# Patient Record
Sex: Male | Born: 1935 | Race: Black or African American | Hispanic: No | Smoking: Never smoker
Health system: Southern US, Community
[De-identification: ages and names within clinical notes are randomized; demographics above are authoritative.]

## PROBLEM LIST (undated history)

## (undated) DIAGNOSIS — R350 Frequency of micturition: Secondary | ICD-10-CM

## (undated) DIAGNOSIS — I10 Essential (primary) hypertension: Secondary | ICD-10-CM

## (undated) DIAGNOSIS — N529 Male erectile dysfunction, unspecified: Secondary | ICD-10-CM

## (undated) DIAGNOSIS — R55 Syncope and collapse: Secondary | ICD-10-CM

## (undated) DIAGNOSIS — I509 Heart failure, unspecified: Secondary | ICD-10-CM

## (undated) DIAGNOSIS — E785 Hyperlipidemia, unspecified: Secondary | ICD-10-CM

## (undated) DIAGNOSIS — H269 Unspecified cataract: Secondary | ICD-10-CM

## (undated) DIAGNOSIS — G709 Myoneural disorder, unspecified: Secondary | ICD-10-CM

## (undated) DIAGNOSIS — C801 Malignant (primary) neoplasm, unspecified: Secondary | ICD-10-CM

## (undated) DIAGNOSIS — J189 Pneumonia, unspecified organism: Secondary | ICD-10-CM

## (undated) HISTORY — PX: HERNIA REPAIR: SHX51

## (undated) HISTORY — PX: SMALL INTESTINE SURGERY: SHX150

## (undated) HISTORY — PX: PROSTATE SURGERY: SHX751

## (undated) HISTORY — PX: TONSILLECTOMY: SUR1361

## (undated) HISTORY — PX: EYE SURGERY: SHX253

---

## 1997-08-13 ENCOUNTER — Other Ambulatory Visit: Admission: RE | Admit: 1997-08-13 | Discharge: 1997-08-13 | Payer: Self-pay | Admitting: Internal Medicine

## 1998-05-09 ENCOUNTER — Encounter: Payer: Self-pay | Admitting: Emergency Medicine

## 1998-05-09 ENCOUNTER — Emergency Department (HOSPITAL_COMMUNITY): Admission: EM | Admit: 1998-05-09 | Discharge: 1998-05-09 | Payer: Self-pay | Admitting: Emergency Medicine

## 1999-04-01 ENCOUNTER — Emergency Department (HOSPITAL_COMMUNITY): Admission: EM | Admit: 1999-04-01 | Discharge: 1999-04-01 | Payer: Self-pay | Admitting: Emergency Medicine

## 1999-04-01 ENCOUNTER — Encounter: Payer: Self-pay | Admitting: Emergency Medicine

## 2003-08-17 ENCOUNTER — Emergency Department (HOSPITAL_COMMUNITY): Admission: EM | Admit: 2003-08-17 | Discharge: 2003-08-18 | Payer: Self-pay | Admitting: Emergency Medicine

## 2004-07-03 ENCOUNTER — Ambulatory Visit (HOSPITAL_COMMUNITY): Admission: RE | Admit: 2004-07-03 | Discharge: 2004-07-03 | Payer: Self-pay | Admitting: Endocrinology

## 2004-11-04 ENCOUNTER — Ambulatory Visit (HOSPITAL_COMMUNITY): Admission: RE | Admit: 2004-11-04 | Discharge: 2004-11-04 | Payer: Self-pay | Admitting: Endocrinology

## 2005-02-17 ENCOUNTER — Ambulatory Visit: Payer: Self-pay | Admitting: Gastroenterology

## 2005-03-12 ENCOUNTER — Ambulatory Visit: Payer: Self-pay | Admitting: Gastroenterology

## 2005-03-12 ENCOUNTER — Encounter (INDEPENDENT_AMBULATORY_CARE_PROVIDER_SITE_OTHER): Payer: Self-pay | Admitting: Specialist

## 2005-03-19 ENCOUNTER — Ambulatory Visit (HOSPITAL_COMMUNITY): Admission: RE | Admit: 2005-03-19 | Discharge: 2005-03-19 | Payer: Self-pay | Admitting: Urology

## 2005-03-29 ENCOUNTER — Ambulatory Visit (HOSPITAL_COMMUNITY): Admission: RE | Admit: 2005-03-29 | Discharge: 2005-03-29 | Payer: Self-pay | Admitting: Urology

## 2005-04-02 ENCOUNTER — Emergency Department (HOSPITAL_COMMUNITY): Admission: EM | Admit: 2005-04-02 | Discharge: 2005-04-02 | Payer: Self-pay | Admitting: Emergency Medicine

## 2005-04-05 ENCOUNTER — Ambulatory Visit (HOSPITAL_COMMUNITY): Admission: RE | Admit: 2005-04-05 | Discharge: 2005-04-05 | Payer: Self-pay | Admitting: Urology

## 2005-04-14 ENCOUNTER — Ambulatory Visit (HOSPITAL_COMMUNITY): Admission: RE | Admit: 2005-04-14 | Discharge: 2005-04-14 | Payer: Self-pay | Admitting: Urology

## 2005-04-30 ENCOUNTER — Encounter (HOSPITAL_COMMUNITY): Admission: RE | Admit: 2005-04-30 | Discharge: 2005-07-29 | Payer: Self-pay | Admitting: Urology

## 2005-04-30 ENCOUNTER — Emergency Department (HOSPITAL_COMMUNITY): Admission: EM | Admit: 2005-04-30 | Discharge: 2005-04-30 | Payer: Self-pay | Admitting: Emergency Medicine

## 2005-05-05 ENCOUNTER — Emergency Department (HOSPITAL_COMMUNITY): Admission: EM | Admit: 2005-05-05 | Discharge: 2005-05-05 | Payer: Self-pay | Admitting: Emergency Medicine

## 2005-05-25 ENCOUNTER — Ambulatory Visit: Payer: Self-pay | Admitting: Oncology

## 2005-07-28 ENCOUNTER — Ambulatory Visit: Payer: Self-pay | Admitting: Oncology

## 2006-03-31 ENCOUNTER — Emergency Department (HOSPITAL_COMMUNITY): Admission: EM | Admit: 2006-03-31 | Discharge: 2006-03-31 | Payer: Self-pay | Admitting: Emergency Medicine

## 2006-12-23 ENCOUNTER — Observation Stay (HOSPITAL_COMMUNITY): Admission: EM | Admit: 2006-12-23 | Discharge: 2006-12-24 | Payer: Self-pay | Admitting: Emergency Medicine

## 2010-04-20 ENCOUNTER — Encounter
Admission: RE | Admit: 2010-04-20 | Discharge: 2010-04-20 | Payer: Self-pay | Source: Home / Self Care | Attending: Endocrinology | Admitting: Endocrinology

## 2010-05-20 ENCOUNTER — Ambulatory Visit (HOSPITAL_COMMUNITY)
Admission: RE | Admit: 2010-05-20 | Discharge: 2010-05-20 | Payer: Self-pay | Source: Home / Self Care | Attending: Endocrinology | Admitting: Endocrinology

## 2010-09-22 NOTE — H&P (Signed)
NAME:  [name], [name]                 ACCOUNT NO.:  [id number]   MEDICAL RECORD NO.:  [id number]          PATIENT TYPE:  EMS   LOCATION:  ED                           FACILITY:  [facility]   PHYSICIAN:  Alfonse Alpers. Gegick, M.D.DATE OF BIRTH:  09/04/1930   DATE OF ADMISSION:  12/23/2006  DATE OF DISCHARGE:                              HISTORY & PHYSICAL   CHIEF COMPLAINT:  This is a 77 year old man who presents with a history  of syncope.   HISTORY OF PRESENT ILLNESS:  The patient has been in relatively good  health.  He has a history of several medical problems including diabetes  mellitus, hypertension and dyslipidemia.  He had an episode of syncope  approximately 5 years ago.  This was a brief episode of syncope and had  no discernible etiology for this.  He has been doing well except for the  last 3 days he has noticed some slight lightheadedness.  This is  associated with the usual activities.  Possibly it was related to some  vertigo.  However, the symptoms appear to be more of a sensation of  things becoming lightheaded.  Today, after eating breakfast, he went for  his usual walk.  He had no shortness of breath, no chest pain and no  symptoms during the episode except for the slight lightheadedness which  has been present for the last 3 days.  While talking to the mailman, he  had a brief episode of syncope.  Not completely certain whether this was  complete syncope or not.  He did not hurt himself.  There is no history  of head trauma.  He presented to the emergency room, and laboratory data  is negative.  He feels well now.  There are no symptoms now.   PAST MEDICAL HISTORY:  He has a history of cancer of the prostate, and  this has been treated.  He also has a history of diabetes mellitus which  has been controlled with Lantus insulin and Humalog.  In addition, he  has been taking ACTOplus met.  He has a history of dyslipidemia, and he  has been taking Lipitor for this.   MEDICATIONS:  Medications prior to this admission include:  1. Lipitor 40 mg one a day.  2. Avalide 300/25 one daily.  3. Labetalol 200 b.i.d.  4. Lantus 32 units daily.  5. NovoLog 10 units.   PERSONAL HISTORY:  He does not smoke.  He denies any history of  excessive alcohol.   PHYSICAL EXAM:  GENERAL:  This a well-developed man who appears  clinically stable at this time.  He is essentially asymptomatic.  He  moves all 4 extremities.  No muscle strength weakness is present.  HEAD:  Normocephalic without any evidence of trauma.  NECK:  Supple.  Both carotids are palpable.  No bruits are noted.  The  thyroid is not enlarged.  LUNGS:  Clear.  CARDIOVASCULAR:  Rhythm is regular.  Occasional extrasystole is noted.  NEUROMUSCULAR:  Essentially normal.  He moves all 4 extremities.  NEURO:  Mentally he is alert, and no  other focal or neurological  symptoms are present.   IMPRESSION:  1. Syncope.  2. Diabetes mellitus (per history).  3. History of hypertension.  4. History of dyslipidemia.   DISCUSSION:  The patient probably has a benign episode of syncope.  He  will be admitted to the hospital and on telemetry for observation.           ______________________________  Alfonse Alpers Dagoberto Ligas, M.D.     CGG/MEDQ  D:  12/23/2006  T:  12/25/2006  Job:  [id number]

## 2010-09-22 NOTE — Discharge Summary (Signed)
[name], [name]                 ACCOUNT NO.:  [id number]   MEDICAL RECORD NO.:  [id number]          PATIENT TYPE:  INP   LOCATION:  1402                         FACILITY:  [facility]   PHYSICIAN:  Alfonse Alpers. Gegick, M.D.DATE OF BIRTH:  1930/09/20   DATE OF ADMISSION:  12/23/2006  DATE OF DISCHARGE:  12/24/2006                               DISCHARGE SUMMARY   HISTORY:  This is a 77 year old man who previously had an episode of  syncope approximately 5 years ago.  No definable etiology was determined  at that time.  He has been doing well and feeling well except for the  last 3 days prior to this admission he has felt somewhat lightheaded.  This was exacerbated during his exercise.  He went or his usual walk on  the morning of his admission, and then after he returned, he had an  episode of syncope while talking to a mailman.  During his previous walk  at that time, the patient did not have any chest pain or shortness of  breath.  There are no cardiac symptoms.  He also checked his sugar, and  there was no hypoglycemia prior to his walk.  He was brought to the  emergency room, and his symptoms had completely resolved.  He felt  perfectly fine.   MEDICATIONS PRIOR TO ADMISSION:  Included Lipitor, Avalide, labetalol,  Actoplus Met, Lantus, and NovoLog.   PHYSICAL EXAMINATION:  Was essentially negative.  No localizing or  neurological signs were present.  He felt well, and no changes were  present.   IMPRESSION ON ADMISSION:  Syncope, possibly related to increase in  activity with the warm weather associated with some mild dehydration.   HOSPITAL COURSE:  The patient was admitted to the hospital and placed on  telemetry.  He felt well and did well. No a to have a episodes of  ventricular tachycardia or PVCs.  He was asymptomatic, and he was then  discharged to be further evaluated as an outpatient.   IMPRESSION ON DISCHARGE:  1. Syncope, etiology possibly related to mild  dehydration.  2. Diabetes mellitus per history.  3. History of hypertension.   DISCHARGE DIET:  Carbohydrate-restricted diet.   ACTIVITY ON DISCHARGE:  Gradually to be increased.   FOLLOWUP:  He will be seeing Dr. Lucianne Muss on Wednesday.   CONDITION ON DISCHARGE:  Improved.          ______________________________  Alfonse Alpers Dagoberto Ligas, M.D.    CGG/MEDQ  D:  12/24/2006  T:  12/25/2006  Job:  [id number]

## 2010-09-25 NOTE — Op Note (Signed)
NAME:  [name], [name]                 ACCOUNT NO.:  [id number]   MEDICAL RECORD NO.:  [id number]          PATIENT TYPE:  AMB   LOCATION:  DAY                          FACILITY:  [facility]   PHYSICIAN:  Sigmund I. Patsi Sears, M.D.DATE OF BIRTH:  Nov 19, 1930   DATE OF PROCEDURE:  04/14/2005  DATE OF DISCHARGE:                                 OPERATIVE REPORT   PREOPERATIVE DIAGNOSES:  Clinical T1C adenocarcinoma of the prostate.   POSTOPERATIVE DIAGNOSES:  Clinical T1C adenocarcinoma of the prostate.   OPERATION:  Cryoablation of the prostate.   SURGEON:  Sigmund I. Patsi Sears, M.D.   ANESTHESIA:  General LMA.   PREPARATION:  After appropriate preanesthesia, the patient is brought to the  operating room, placed on the operating table in dorsal supine position  where general LMA anesthesia was introduced. He was then replaced in dorsal  lithotomy position where the pubis was prepped with Betadine solution and  draped in the usual fashion.   HISTORY:  [name] is a 77 year old married black male, insulin dependent  diabetic, with a history of BPH treated with Cardura, erectile function in  the past treated with Viagra. PSA jumped from 3.49 in 2005 to 6.,03 in 2006,  with biopsy showing Gleason 6 adenocarcinoma of the prostate, as well as  PIN. He has selected cryosurgery as primary therapy. (T1C). Note, his past  history of peripheral neuropathy, and retinopathy.   DESCRIPTION OF PROCEDURE:  With the patient in dorsal lithotomy position,  the pubis was prepped with Betadine solution and draped in the usual  fashion.   Cystourethroscopy was accomplished, and showed a normal-appearing urethra in  its pendulous portion, and a normal membranous urethra and a  normal  prostatic urethra. The bladder neck was in normal position. The bladder  itself showed no evidence of bladder, stone, tumor or diverticular  formation. There was clear efflux in both orifices. Under direct vision, a  suprapubic tube was placed to straight drainage. A guidewire was then passed  in the bladder, and a urethral warming device was placed across the  guidewire into the bladder without difficulty.   The needles were then placed for cryosurgery, and two thermal sensors were  also placed. Five groups of needles were placed in total. Two free thaw  cycles were then accomplished, with excellent treatment of the entire gland.  The  patient tolerated the procedure quite well. Following the second thaw, the  needles were removed. Pressure was placed in the perineum. The patient was  given IV Toradol, awakened and taken to the recovery room in good condition.      Sigmund I. Patsi Sears, M.D.  Electronically Signed     SIT/MEDQ  D:  04/14/2005  T:  04/14/2005  Job:  [id number]

## 2010-11-13 ENCOUNTER — Emergency Department (HOSPITAL_COMMUNITY)
Admission: EM | Admit: 2010-11-13 | Discharge: 2010-11-13 | Disposition: A | Discharge status: Home / Self Care | Payer: Medicare Other | Attending: Emergency Medicine | Admitting: Emergency Medicine

## 2010-11-13 DIAGNOSIS — R319 Hematuria, unspecified: Secondary | ICD-10-CM | POA: Insufficient documentation

## 2010-11-13 DIAGNOSIS — I10 Essential (primary) hypertension: Secondary | ICD-10-CM | POA: Insufficient documentation

## 2010-11-13 DIAGNOSIS — N390 Urinary tract infection, site not specified: Secondary | ICD-10-CM | POA: Insufficient documentation

## 2010-11-13 DIAGNOSIS — Z794 Long term (current) use of insulin: Secondary | ICD-10-CM | POA: Insufficient documentation

## 2010-11-13 DIAGNOSIS — Z8546 Personal history of malignant neoplasm of prostate: Secondary | ICD-10-CM | POA: Insufficient documentation

## 2010-11-13 DIAGNOSIS — E119 Type 2 diabetes mellitus without complications: Secondary | ICD-10-CM | POA: Insufficient documentation

## 2010-11-13 LAB — URINALYSIS, ROUTINE W REFLEX MICROSCOPIC
Glucose, UA: NEGATIVE mg/dL
Nitrite: NEGATIVE
Protein, ur: >300 mg/dL — AB
Specific Gravity, Urine: 1.024 (ref 1.005–1.030)
Urobilinogen, UA: 1 mg/dL (ref 0.0–1.0)
pH: 6.5 (ref 5.0–8.0)

## 2010-11-13 LAB — URINE MICROSCOPIC-ADD ON

## 2010-11-13 LAB — CBC
HCT: 35.3 % — ABNORMAL LOW (ref 39.0–52.0)
Hemoglobin: 12.2 g/dL — ABNORMAL LOW (ref 13.0–17.0)
MCHC: 34.6 g/dL (ref 30.0–36.0)
RBC: 3.81 MIL/uL — ABNORMAL LOW (ref 4.22–5.81)
RDW: 14.5 % (ref 11.5–15.5)
WBC: 8.7 10*3/uL (ref 4.0–10.5)

## 2010-11-13 LAB — PROTIME-INR
INR: 1.04 (ref 0.00–1.49)
Prothrombin Time: 13.8 seconds (ref 11.6–15.2)

## 2010-11-13 LAB — APTT: aPTT: 37 seconds (ref 24–37)

## 2010-11-15 LAB — URINE CULTURE
Colony Count: 9000
Culture  Setup Time: 201207070125

## 2011-02-19 LAB — DIFFERENTIAL
Basophils Absolute: 0
Basophils Relative: 1
Eosinophils Absolute: 0.1
Eosinophils Relative: 1
Lymphocytes Relative: 23
Lymphs Abs: 1.5
Monocytes Absolute: 0.4
Monocytes Relative: 6
Neutro Abs: 4.6
Neutrophils Relative %: 70

## 2011-02-19 LAB — URINALYSIS, ROUTINE W REFLEX MICROSCOPIC
Bilirubin Urine: NEGATIVE
Glucose, UA: NEGATIVE
Hgb urine dipstick: NEGATIVE
Ketones, ur: NEGATIVE
Nitrite: NEGATIVE
Protein, ur: NEGATIVE
Specific Gravity, Urine: 1.01
Urobilinogen, UA: 0.2
pH: 7

## 2011-02-19 LAB — CBC
HCT: 34.8 — ABNORMAL LOW
HCT: 38.6 — ABNORMAL LOW
Hemoglobin: 11.8 — ABNORMAL LOW
Hemoglobin: 13.3
MCHC: 33.8
MCHC: 34.4
MCV: 93.9
MCV: 94.6
Platelets: 196
Platelets: 203
RBC: 3.68 — ABNORMAL LOW
RBC: 4.11 — ABNORMAL LOW
RDW: 14.4 — ABNORMAL HIGH
RDW: 14.4 — ABNORMAL HIGH
WBC: 6.7
WBC: 6.9

## 2011-02-19 LAB — BASIC METABOLIC PANEL
BUN: 10
CO2: 26
Calcium: 9.6
Chloride: 105
Creatinine, Ser: 1.23
GFR calc Af Amer: >60
GFR calc non Af Amer: 58 — ABNORMAL LOW
Glucose, Bld: 81
Potassium: 3.8
Sodium: 139

## 2011-02-19 LAB — POCT CARDIAC MARKERS
CKMB, poc: 2.9
Myoglobin, poc: 286
Operator id: 4661
Troponin i, poc: <0.05

## 2011-03-09 ENCOUNTER — Other Ambulatory Visit: Payer: Self-pay | Admitting: Endocrinology

## 2011-03-09 DIAGNOSIS — R221 Localized swelling, mass and lump, neck: Secondary | ICD-10-CM

## 2011-03-17 ENCOUNTER — Ambulatory Visit
Admission: RE | Admit: 2011-03-17 | Discharge: 2011-03-17 | Disposition: A | Discharge status: Home / Self Care | Payer: Medicare Other | Source: Ambulatory Visit | Attending: Endocrinology | Admitting: Endocrinology

## 2011-03-17 DIAGNOSIS — R221 Localized swelling, mass and lump, neck: Secondary | ICD-10-CM

## 2011-03-19 ENCOUNTER — Other Ambulatory Visit: Payer: Self-pay | Admitting: Endocrinology

## 2011-03-19 DIAGNOSIS — E041 Nontoxic single thyroid nodule: Secondary | ICD-10-CM

## 2011-03-26 ENCOUNTER — Ambulatory Visit
Admission: RE | Admit: 2011-03-26 | Discharge: 2011-03-26 | Disposition: A | Discharge status: Home / Self Care | Payer: Medicare Other | Source: Ambulatory Visit | Attending: Endocrinology | Admitting: Endocrinology

## 2011-03-26 DIAGNOSIS — E041 Nontoxic single thyroid nodule: Secondary | ICD-10-CM

## 2011-05-12 DIAGNOSIS — D649 Anemia, unspecified: Secondary | ICD-10-CM | POA: Diagnosis not present

## 2011-05-14 DIAGNOSIS — E785 Hyperlipidemia, unspecified: Secondary | ICD-10-CM | POA: Diagnosis not present

## 2011-05-14 DIAGNOSIS — I10 Essential (primary) hypertension: Secondary | ICD-10-CM | POA: Diagnosis not present

## 2011-05-14 DIAGNOSIS — D649 Anemia, unspecified: Secondary | ICD-10-CM | POA: Diagnosis not present

## 2011-05-18 DIAGNOSIS — H2510 Age-related nuclear cataract, unspecified eye: Secondary | ICD-10-CM | POA: Diagnosis not present

## 2011-05-19 DIAGNOSIS — L608 Other nail disorders: Secondary | ICD-10-CM | POA: Diagnosis not present

## 2011-05-19 DIAGNOSIS — E1149 Type 2 diabetes mellitus with other diabetic neurological complication: Secondary | ICD-10-CM | POA: Diagnosis not present

## 2011-05-26 DIAGNOSIS — H2589 Other age-related cataract: Secondary | ICD-10-CM | POA: Diagnosis not present

## 2011-05-26 DIAGNOSIS — H2510 Age-related nuclear cataract, unspecified eye: Secondary | ICD-10-CM | POA: Diagnosis not present

## 2011-05-26 DIAGNOSIS — E11319 Type 2 diabetes mellitus with unspecified diabetic retinopathy without macular edema: Secondary | ICD-10-CM | POA: Diagnosis not present

## 2011-05-26 DIAGNOSIS — E1139 Type 2 diabetes mellitus with other diabetic ophthalmic complication: Secondary | ICD-10-CM | POA: Diagnosis not present

## 2011-05-26 DIAGNOSIS — IMO0002 Reserved for concepts with insufficient information to code with codable children: Secondary | ICD-10-CM | POA: Diagnosis not present

## 2011-06-09 DIAGNOSIS — E1139 Type 2 diabetes mellitus with other diabetic ophthalmic complication: Secondary | ICD-10-CM | POA: Diagnosis not present

## 2011-06-09 DIAGNOSIS — E11319 Type 2 diabetes mellitus with unspecified diabetic retinopathy without macular edema: Secondary | ICD-10-CM | POA: Diagnosis not present

## 2011-06-09 DIAGNOSIS — H2589 Other age-related cataract: Secondary | ICD-10-CM | POA: Diagnosis not present

## 2011-06-09 DIAGNOSIS — H2510 Age-related nuclear cataract, unspecified eye: Secondary | ICD-10-CM | POA: Diagnosis not present

## 2011-06-09 DIAGNOSIS — IMO0002 Reserved for concepts with insufficient information to code with codable children: Secondary | ICD-10-CM | POA: Diagnosis not present

## 2011-07-09 ENCOUNTER — Other Ambulatory Visit: Payer: Medicare Other

## 2011-07-29 DIAGNOSIS — D649 Anemia, unspecified: Secondary | ICD-10-CM | POA: Diagnosis not present

## 2011-07-29 DIAGNOSIS — E785 Hyperlipidemia, unspecified: Secondary | ICD-10-CM | POA: Diagnosis not present

## 2011-07-29 DIAGNOSIS — R609 Edema, unspecified: Secondary | ICD-10-CM | POA: Diagnosis not present

## 2011-07-29 DIAGNOSIS — IMO0001 Reserved for inherently not codable concepts without codable children: Secondary | ICD-10-CM | POA: Diagnosis not present

## 2011-08-13 DIAGNOSIS — R609 Edema, unspecified: Secondary | ICD-10-CM | POA: Diagnosis not present

## 2011-08-13 DIAGNOSIS — D649 Anemia, unspecified: Secondary | ICD-10-CM | POA: Diagnosis not present

## 2011-08-13 DIAGNOSIS — E785 Hyperlipidemia, unspecified: Secondary | ICD-10-CM | POA: Diagnosis not present

## 2011-08-13 DIAGNOSIS — I10 Essential (primary) hypertension: Secondary | ICD-10-CM | POA: Diagnosis not present

## 2011-08-13 DIAGNOSIS — R5383 Other fatigue: Secondary | ICD-10-CM | POA: Diagnosis not present

## 2011-08-13 DIAGNOSIS — R5381 Other malaise: Secondary | ICD-10-CM | POA: Diagnosis not present

## 2011-08-24 DIAGNOSIS — L608 Other nail disorders: Secondary | ICD-10-CM | POA: Diagnosis not present

## 2011-08-24 DIAGNOSIS — E1149 Type 2 diabetes mellitus with other diabetic neurological complication: Secondary | ICD-10-CM | POA: Diagnosis not present

## 2011-08-27 DIAGNOSIS — I70219 Atherosclerosis of native arteries of extremities with intermittent claudication, unspecified extremity: Secondary | ICD-10-CM | POA: Diagnosis not present

## 2011-09-01 ENCOUNTER — Other Ambulatory Visit: Payer: Self-pay | Admitting: Endocrinology

## 2011-09-01 DIAGNOSIS — E042 Nontoxic multinodular goiter: Secondary | ICD-10-CM

## 2011-09-13 ENCOUNTER — Ambulatory Visit
Admission: RE | Admit: 2011-09-13 | Discharge: 2011-09-13 | Disposition: A | Discharge status: Home / Self Care | Payer: Medicare Other | Source: Ambulatory Visit | Attending: Endocrinology | Admitting: Endocrinology

## 2011-09-13 DIAGNOSIS — E042 Nontoxic multinodular goiter: Secondary | ICD-10-CM | POA: Diagnosis not present

## 2011-10-13 DIAGNOSIS — IMO0001 Reserved for inherently not codable concepts without codable children: Secondary | ICD-10-CM | POA: Diagnosis not present

## 2011-10-13 DIAGNOSIS — D649 Anemia, unspecified: Secondary | ICD-10-CM | POA: Diagnosis not present

## 2011-10-15 ENCOUNTER — Ambulatory Visit
Admission: RE | Admit: 2011-10-15 | Discharge: 2011-10-15 | Disposition: A | Discharge status: Home / Self Care | Payer: Medicare Other | Source: Ambulatory Visit | Attending: Endocrinology | Admitting: Endocrinology

## 2011-10-15 ENCOUNTER — Other Ambulatory Visit: Payer: Self-pay | Admitting: Endocrinology

## 2011-10-15 DIAGNOSIS — M6281 Muscle weakness (generalized): Secondary | ICD-10-CM | POA: Diagnosis not present

## 2011-10-15 DIAGNOSIS — E1149 Type 2 diabetes mellitus with other diabetic neurological complication: Secondary | ICD-10-CM | POA: Diagnosis not present

## 2011-10-15 DIAGNOSIS — M47817 Spondylosis without myelopathy or radiculopathy, lumbosacral region: Secondary | ICD-10-CM | POA: Diagnosis not present

## 2011-10-15 DIAGNOSIS — M545 Low back pain, unspecified: Secondary | ICD-10-CM

## 2011-10-15 DIAGNOSIS — R059 Cough, unspecified: Secondary | ICD-10-CM

## 2011-10-15 DIAGNOSIS — R05 Cough: Secondary | ICD-10-CM

## 2011-10-15 DIAGNOSIS — R5383 Other fatigue: Secondary | ICD-10-CM | POA: Diagnosis not present

## 2011-10-15 DIAGNOSIS — M5137 Other intervertebral disc degeneration, lumbosacral region: Secondary | ICD-10-CM | POA: Diagnosis not present

## 2011-10-15 DIAGNOSIS — R0602 Shortness of breath: Secondary | ICD-10-CM | POA: Diagnosis not present

## 2011-10-15 DIAGNOSIS — R634 Abnormal weight loss: Secondary | ICD-10-CM | POA: Diagnosis not present

## 2011-10-15 DIAGNOSIS — D649 Anemia, unspecified: Secondary | ICD-10-CM | POA: Diagnosis not present

## 2011-10-15 DIAGNOSIS — E1142 Type 2 diabetes mellitus with diabetic polyneuropathy: Secondary | ICD-10-CM | POA: Diagnosis not present

## 2011-10-18 ENCOUNTER — Emergency Department (HOSPITAL_COMMUNITY): Payer: Medicare Other

## 2011-10-18 ENCOUNTER — Emergency Department (HOSPITAL_COMMUNITY)
Admission: EM | Admit: 2011-10-18 | Discharge: 2011-10-19 | Disposition: A | Discharge status: Home / Self Care | Payer: Medicare Other | Attending: Emergency Medicine | Admitting: Emergency Medicine

## 2011-10-18 ENCOUNTER — Encounter (HOSPITAL_COMMUNITY): Payer: Self-pay

## 2011-10-18 DIAGNOSIS — M4810 Ankylosing hyperostosis [Forestier], site unspecified: Secondary | ICD-10-CM | POA: Insufficient documentation

## 2011-10-18 DIAGNOSIS — R531 Weakness: Secondary | ICD-10-CM

## 2011-10-18 DIAGNOSIS — J3489 Other specified disorders of nose and nasal sinuses: Secondary | ICD-10-CM | POA: Diagnosis not present

## 2011-10-18 DIAGNOSIS — R05 Cough: Secondary | ICD-10-CM | POA: Insufficient documentation

## 2011-10-18 DIAGNOSIS — M948X9 Other specified disorders of cartilage, unspecified sites: Secondary | ICD-10-CM | POA: Diagnosis not present

## 2011-10-18 DIAGNOSIS — R059 Cough, unspecified: Secondary | ICD-10-CM | POA: Diagnosis not present

## 2011-10-18 DIAGNOSIS — M549 Dorsalgia, unspecified: Secondary | ICD-10-CM | POA: Diagnosis not present

## 2011-10-18 DIAGNOSIS — M79609 Pain in unspecified limb: Secondary | ICD-10-CM | POA: Diagnosis not present

## 2011-10-18 DIAGNOSIS — M5380 Other specified dorsopathies, site unspecified: Secondary | ICD-10-CM | POA: Diagnosis not present

## 2011-10-18 DIAGNOSIS — I2510 Atherosclerotic heart disease of native coronary artery without angina pectoris: Secondary | ICD-10-CM | POA: Diagnosis not present

## 2011-10-18 DIAGNOSIS — I10 Essential (primary) hypertension: Secondary | ICD-10-CM | POA: Insufficient documentation

## 2011-10-18 DIAGNOSIS — R262 Difficulty in walking, not elsewhere classified: Secondary | ICD-10-CM | POA: Insufficient documentation

## 2011-10-18 DIAGNOSIS — R29898 Other symptoms and signs involving the musculoskeletal system: Secondary | ICD-10-CM | POA: Diagnosis not present

## 2011-10-18 DIAGNOSIS — I6789 Other cerebrovascular disease: Secondary | ICD-10-CM | POA: Diagnosis not present

## 2011-10-18 DIAGNOSIS — M545 Low back pain, unspecified: Secondary | ICD-10-CM | POA: Insufficient documentation

## 2011-10-18 DIAGNOSIS — R5383 Other fatigue: Secondary | ICD-10-CM | POA: Diagnosis not present

## 2011-10-18 DIAGNOSIS — R5381 Other malaise: Secondary | ICD-10-CM | POA: Diagnosis not present

## 2011-10-18 DIAGNOSIS — R0602 Shortness of breath: Secondary | ICD-10-CM | POA: Diagnosis not present

## 2011-10-18 DIAGNOSIS — R269 Unspecified abnormalities of gait and mobility: Secondary | ICD-10-CM | POA: Diagnosis not present

## 2011-10-18 DIAGNOSIS — E119 Type 2 diabetes mellitus without complications: Secondary | ICD-10-CM | POA: Insufficient documentation

## 2011-10-18 DIAGNOSIS — R404 Transient alteration of awareness: Secondary | ICD-10-CM | POA: Diagnosis not present

## 2011-10-18 HISTORY — DX: Heart failure, unspecified: I50.9

## 2011-10-18 HISTORY — DX: Essential (primary) hypertension: I10

## 2011-10-18 HISTORY — DX: Male erectile dysfunction, unspecified: N52.9

## 2011-10-18 LAB — URINE MICROSCOPIC-ADD ON

## 2011-10-18 LAB — COMPREHENSIVE METABOLIC PANEL
ALT: 11 U/L (ref 0–53)
AST: 22 U/L (ref 0–37)
Albumin: 3.7 g/dL (ref 3.5–5.2)
Alkaline Phosphatase: 98 U/L (ref 39–117)
BUN: 25 mg/dL — ABNORMAL HIGH (ref 6–23)
Potassium: 3.6 mEq/L (ref 3.5–5.1)
Sodium: 136 mEq/L (ref 135–145)
Total Protein: 7 g/dL (ref 6.0–8.3)

## 2011-10-18 LAB — URINALYSIS, ROUTINE W REFLEX MICROSCOPIC
Hgb urine dipstick: NEGATIVE
Specific Gravity, Urine: 1.021 (ref 1.005–1.030)
Urobilinogen, UA: 1 mg/dL (ref 0.0–1.0)
pH: 5 (ref 5.0–8.0)

## 2011-10-18 LAB — CBC
MCH: 31.6 pg (ref 26.0–34.0)
MCHC: 34.5 g/dL (ref 30.0–36.0)
Platelets: 166 10*3/uL (ref 150–400)
RDW: 14.9 % (ref 11.5–15.5)

## 2011-10-18 LAB — DIFFERENTIAL
Basophils Absolute: 0 10*3/uL (ref 0.0–0.1)
Basophils Relative: 1 % (ref 0–1)
Eosinophils Absolute: 0.2 10*3/uL (ref 0.0–0.7)
Neutro Abs: 3.6 10*3/uL (ref 1.7–7.7)
Neutrophils Relative %: 55 % (ref 43–77)

## 2011-10-18 LAB — CK: Total CK: 421 U/L — ABNORMAL HIGH (ref 7–232)

## 2011-10-18 NOTE — ED Notes (Signed)
Patient transported to CT 

## 2011-10-18 NOTE — Discharge Instructions (Signed)
I have recommended that she be admitted to the hospital for further evaluation, but you would not allow me to admit to. I am concerned that there's pressure on your spinal cord from the bones in your spine. Please make an appointment with the neurosurgeon for further evaluation. You will need to have an MRI scan to further evaluate the spinal cord. In the meantime, do not try walking him as you are using your walker. If you change your mind at any time, come right back to the emergency department and we will make arrangements to admit you.

## 2011-10-18 NOTE — ED Provider Notes (Addendum)
History     CSN: [id number]  Arrival date & time 10/18/11  1844   First MD Initiated Contact with Patient 10/18/11 1855      Chief Complaint  Patient presents with  . Weakness    (Consider location/radiation/quality/duration/timing/severity/associated sxs/prior treatment) Patient is a 78 y.o. male presenting with weakness. The history is provided by the patient.  Weakness  Additional symptoms include weakness.  He came to the emergency department today because his legs gave out on him. He has been having weakness in his legs which has been progressively getting worse over the last several months. He denies any weakness in his arms. He denies any bowel or bladder dysfunction. Denies any numbness or tingling. Symptoms are severe. Nothing makes it better nothing makes it worse.  Past Medical History  Diagnosis Date  . Diabetes mellitus   . ED (erectile dysfunction)   . Hypertension   . CHF (congestive heart failure)     No past surgical history on file.  No family history on file.  History  Substance Use Topics  . Smoking status: Never Smoker   . Smokeless tobacco: Not on file  . Alcohol Use: 1.2 oz/week    2 Shots of liquor per week      Review of Systems  Neurological: Positive for weakness.  All other systems reviewed and are negative.    Allergies  Review of patient's allergies indicates no known allergies.  Home Medications  No current outpatient prescriptions on file.  BP 95/54  Pulse 60  Temp(Src) 97.1 F (36.2 C) (Oral)  Resp 20  SpO2 92%  Physical Exam  Nursing note and vitals reviewed.  78 year old male who is resting comfortably and in no acute distress. Vital signs are normal. Oxygen saturation is 99% which is normal. Head is normocephalic and atraumatic. PERRLA, EOMI. Neck is nontender and supple. Back is nontender. Lungs are clear without rales, wheezes, rhonchi. Heart has regular rate rhythm without murmur. Abdomen is soft, flat, nontender  without masses or hepatosplenomegaly. Extremities have no cyanosis, full range of motion is present. Skin is warm and dry without rash. Neurologic: Mental status is normal. Cranial nerves are intact. Strength in his arms is 5 over 5. Leg strength is as follows: Hip flexors are 4/5, knee extensors are 4.5 over 5, knee flexors are 3/5. There no sensory deficits identified.  ED Course  Procedures (including critical care time)  Results for orders placed during the hospital encounter of 10/18/11  CBC      Component Value Range   WBC 6.6  4.0 - 10.5 (K/uL)   RBC 3.83 (*) 4.22 - 5.81 (MIL/uL)   Hemoglobin 12.1 (*) 13.0 - 17.0 (g/dL)   HCT 40.9 (*) 81.1 - 52.0 (%)   MCV 91.6  78.0 - 100.0 (fL)   MCH 31.6  26.0 - 34.0 (pg)   MCHC 34.5  30.0 - 36.0 (g/dL)   RDW 91.4  78.2 - 95.6 (%)   Platelets 166  150 - 400 (K/uL)  DIFFERENTIAL      Component Value Range   Neutrophils Relative 55  43 - 77 (%)   Neutro Abs 3.6  1.7 - 7.7 (K/uL)   Lymphocytes Relative 35  12 - 46 (%)   Lymphs Abs 2.3  0.7 - 4.0 (K/uL)   Monocytes Relative 8  3 - 12 (%)   Monocytes Absolute 0.5  0.1 - 1.0 (K/uL)   Eosinophils Relative 3  0 - 5 (%)   Eosinophils Absolute 0.2  0.0 - 0.7 (K/uL)   Basophils Relative 1  0 - 1 (%)   Basophils Absolute 0.0  0.0 - 0.1 (K/uL)  COMPREHENSIVE METABOLIC PANEL      Component Value Range   Sodium 136  135 - 145 (mEq/L)   Potassium 3.6  3.5 - 5.1 (mEq/L)   Chloride 99  96 - 112 (mEq/L)   CO2 20  19 - 32 (mEq/L)   Glucose, Bld 138 (*) 70 - 99 (mg/dL)   BUN 25 (*) 6 - 23 (mg/dL)   Creatinine, Ser 4.09 (*) 0.50 - 1.35 (mg/dL)   Calcium 9.5  8.4 - 81.1 (mg/dL)   Total Protein 7.0  6.0 - 8.3 (g/dL)   Albumin 3.7  3.5 - 5.2 (g/dL)   AST 22  0 - 37 (U/L)   ALT 11  0 - 53 (U/L)   Alkaline Phosphatase 98  39 - 117 (U/L)   Total Bilirubin 0.3  0.3 - 1.2 (mg/dL)   GFR calc non Af Amer 47 (*) >90 (mL/min)   GFR calc Af Amer 54 (*) >90 (mL/min)  CK      Component Value Range   Total CK  421 (*) 7 - 232 (U/L)  SEDIMENTATION RATE      Component Value Range   Sed Rate 39 (*) 0 - 16 (mm/hr)  URINALYSIS, ROUTINE W REFLEX MICROSCOPIC      Component Value Range   Color, Urine YELLOW  YELLOW    APPearance CLOUDY (*) CLEAR    Specific Gravity, Urine 1.021  1.005 - 1.030    pH 5.0  5.0 - 8.0    Glucose, UA NEGATIVE  NEGATIVE (mg/dL)   Hgb urine dipstick NEGATIVE  NEGATIVE    Bilirubin Urine SMALL (*) NEGATIVE    Ketones, ur 15 (*) NEGATIVE (mg/dL)   Protein, ur 30 (*) NEGATIVE (mg/dL)   Urobilinogen, UA 1.0  0.0 - 1.0 (mg/dL)   Nitrite NEGATIVE  NEGATIVE    Leukocytes, UA NEGATIVE  NEGATIVE   URINE MICROSCOPIC-ADD ON      Component Value Range   Squamous Epithelial / LPF RARE  RARE    WBC, UA 0-2  <3 (WBC/hpf)   RBC / HPF 0-2  <3 (RBC/hpf)   Bacteria, UA FEW (*) RARE    Casts HYALINE CASTS (*) NEGATIVE    Dg Chest 2 View  10/15/2011  *RADIOLOGY REPORT*  Clinical Data: Cough.  Shortness of breath.  Nonsmoker  CHEST - 2 VIEW  Comparison: 04/20/2010  Findings: Heart and mediastinal contours are within normal limits. The lung fields are clear with no signs of focal infiltrate or congestive failure.  No pleural fluid or significant peribronchial cuffing is seen.  Bony structures demonstrate degenerative osteophytosis of the lower thoracic spine and degenerative change of the right shoulder joint.  IMPRESSION: Stable cardiopulmonary appearance with no new focal or acute abnormality noted.  Original Report Authenticated By: Bertha Stakes, M.D.   Dg Lumbar Spine 2-3 Views  10/15/2011  *RADIOLOGY REPORT*  Clinical Data: Low back pain.  LUMBAR SPINE - 2-3 VIEW  Comparison: CT 11/18/2010  Findings: Sclerotic focus in the left iliac crest, stable since prior study felt to represent a bone island.  Degenerative disc disease and facet disease throughout the lumbar spine. Mild disc space narrowing and spurring.  Normal alignment.  No fracture.  SI joints are symmetric and unremarkable.   IMPRESSION: Degenerative changes.  No acute findings.  Original Report Authenticated By: Cyndie Chime, M.D.  Ct Head Wo Contrast  10/18/2011  *RADIOLOGY REPORT*  Clinical Data: Weakness.  CT HEAD WITHOUT CONTRAST  Technique:  Contiguous axial images were obtained from the base of the skull through the vertex without contrast.  Comparison: 12/22/2005  Findings: Bone windows demonstrate ethmoid air cell mucosal thickening.  Sclerotic lesion in the right vertex is stable since 2008, most consistent with a benign etiology. Clear mastoid air cells.  Soft tissue windows demonstrate subtle hypoattenuation suspected in the region of the right basil ganglia/anterior limb internal capsule.  Image 13.  Patchy periventricular white matter hypoattenuation is slightly greater on the left than right and progressive since 2008.  No hemorrhage, mass lesion, hydrocephalus, intra-axial, or extra- axial fluid collection.  IMPRESSION:  1.  Subtle hypoattenuation in the right basil ganglia.  If the patient has localizing symptoms to suggest acute or subacute ischemia in this area, consider MRI. 2.  Otherwise, no acute intracranial abnormality identified. 3.  Small vessel ischemic change in the periventricular white matter.  This is felt to be chronic but progressive since 12/23/2006. 4.  Sinus disease.  Original Report Authenticated By: Consuello Bossier, M.D.   Ct Thoracic Spine Wo Contrast  10/18/2011  *RADIOLOGY REPORT*  Clinical Data: Weakness.  Chronic back pain.  Difficulty ambulating.  Left leg pain. No trauma history submitted.  CT LUMBAR SPINE WITHOUT CONTRAST,CT THORACIC SPINE WITHOUT CONTRAST  Technique:  Multidetector CT imaging of the lumbar spine was performed without intravenous contrast administration. Multiplanar CT image reconstructions were also generated.,Technique: Multidetector CT imaging of the thoracic spine was performed without  Comparison: None.  Findings: Soft tissue windows demonstrate dilated  fluid-filled thoracic esophagus.  A small hiatal hernia.  Cardiomegaly and coronary artery atherosclerosis.  No paravertebral hematoma.  No soft tissue swelling.  Bone windows demonstrate no acute fracture or subluxation.  Maintenance of vertebral body height.  Diffuse idiopathic skeletal hyperostosis involves the thoracic spine.  Relatively maintained intervertebral discs. Facets are well-aligned.  No gross disc abnormality within the thoracic spine.  Mild extension of diffuse idiopathic skeletal hyperostosis into the upper lumbar spine.  Ordinary for age lumbar spondylosis.  Example mild ligamentum flavum thickening at L2-L3 and L4-L5.  L4-L5 right- sided neural foraminal narrowing.  Left-sided L2-L3 and L3-L4 neural foraminal narrowing secondary mild disc bulges.  Motion degradation inferiorly.  Partial fusion of the bilateral sacroiliac joints, likely degenerative.  Sclerosis of the left iliac wing.  IMPRESSION: Diffuse idiopathic skeletal hyperostosis. Expected for age spondylosis with areas of scattered central canal and neural foraminal narrowing.  These are all suboptimally evaluated on CT. The test of choice for evaluation of these areas is unenhanced non emergent spine MR.  No vertebral body height loss or acute finding.  Dilated esophagus suggesting dysmotility or gastroesophageal reflux.  Suboptimally evaluated.  Original Report Authenticated By: Consuello Bossier, M.D.   Ct Lumbar Spine Wo Contrast  10/18/2011  *RADIOLOGY REPORT*  Clinical Data: Weakness.  Chronic back pain.  Difficulty ambulating.  Left leg pain. No trauma history submitted.  CT LUMBAR SPINE WITHOUT CONTRAST,CT THORACIC SPINE WITHOUT CONTRAST  Technique:  Multidetector CT imaging of the lumbar spine was performed without intravenous contrast administration. Multiplanar CT image reconstructions were also generated.,Technique: Multidetector CT imaging of the thoracic spine was performed without  Comparison: None.  Findings: Soft tissue  windows demonstrate dilated fluid-filled thoracic esophagus.  A small hiatal hernia.  Cardiomegaly and coronary artery atherosclerosis.  No paravertebral hematoma.  No soft tissue swelling.  Bone windows demonstrate no acute  fracture or subluxation.  Maintenance of vertebral body height.  Diffuse idiopathic skeletal hyperostosis involves the thoracic spine.  Relatively maintained intervertebral discs. Facets are well-aligned.  No gross disc abnormality within the thoracic spine.  Mild extension of diffuse idiopathic skeletal hyperostosis into the upper lumbar spine.  Ordinary for age lumbar spondylosis.  Example mild ligamentum flavum thickening at L2-L3 and L4-L5.  L4-L5 right- sided neural foraminal narrowing.  Left-sided L2-L3 and L3-L4 neural foraminal narrowing secondary mild disc bulges.  Motion degradation inferiorly.  Partial fusion of the bilateral sacroiliac joints, likely degenerative.  Sclerosis of the left iliac wing.  IMPRESSION: Diffuse idiopathic skeletal hyperostosis. Expected for age spondylosis with areas of scattered central canal and neural foraminal narrowing.  These are all suboptimally evaluated on CT. The test of choice for evaluation of these areas is unenhanced non emergent spine MR.  No vertebral body height loss or acute finding.  Dilated esophagus suggesting dysmotility or gastroesophageal reflux.  Suboptimally evaluated.  Original Report Authenticated By: Consuello Bossier, M.D.   Dg Chest Portable 1 View  10/18/2011  *RADIOLOGY REPORT*  Clinical Data: Cough and shortness of breath.  PORTABLE CHEST - 1 VIEW  Comparison: 10/15/2011  Findings: Heart size and vascularity are normal and the lungs are clear.  No acute osseous abnormality.  Fairly severe degenerative changes of both shoulders.  IMPRESSION: No acute disease in the chest.  Original Report Authenticated By: Gwynn Burly, M.D.      1. Weakness   2. DISH (diffuse idiopathic skeletal hyperostosis)       MDM    Progressive leg weakness which is concerning for spinal cord lesion. Workup has been initiated including MRI of thoracic and lumbar spine.  Patient was unable to tolerate MR scan and was moving during the scan. Therefore, CT scan was ordered to try and be sure there was not any obvious spinal cord compression. 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 I recommended hospitalization for further evaluation but patient is refusing. He has a walker at home. He is referred to Dr. Yetta Barre who is on call for neurosurgery for outpatient evaluation but he is advised to return to the emergency department if he changes his mind and decides that he would be willing to be admitted.     Dione Booze, MD 10/18/11 2358    Date: 10/19/2011  Rate: 60  Rhythm: normal sinus rhythm  QRS Axis: normal  Intervals: PR prolonged  ST/T Wave abnormalities: normal  Conduction Disutrbances:first-degree A-V block  and left bundle branch block  Narrative Interpretation: First degree AV block, left bundle-branch block. When compared with ECG of 12/23/2006, no significant changes are seen.  Old EKG Reviewed: unchanged    Dione Booze, MD 10/19/11 [phone]

## 2011-10-18 NOTE — ED Notes (Addendum)
Per ems- Pt had sudden onset of weakness and pt daughter could not get him up. Neg stroke scale. Pt could stand but was "swaying from side to side." Pt pos for ETOH (pt states he had 2 glasses of gin). Pt took 2 viagra today. Pt and pt daughter stated that this has happened multiple times. Pt went to MD about this in past and was dx with diabetes.

## 2011-11-01 DIAGNOSIS — M48061 Spinal stenosis, lumbar region without neurogenic claudication: Secondary | ICD-10-CM | POA: Diagnosis not present

## 2011-11-03 ENCOUNTER — Other Ambulatory Visit: Payer: Self-pay | Admitting: Neurological Surgery

## 2011-11-03 DIAGNOSIS — M542 Cervicalgia: Secondary | ICD-10-CM

## 2011-11-03 DIAGNOSIS — M48061 Spinal stenosis, lumbar region without neurogenic claudication: Secondary | ICD-10-CM

## 2011-11-06 ENCOUNTER — Ambulatory Visit
Admission: RE | Admit: 2011-11-06 | Discharge: 2011-11-06 | Disposition: A | Discharge status: Home / Self Care | Payer: Medicare Other | Source: Ambulatory Visit | Attending: Neurological Surgery | Admitting: Neurological Surgery

## 2011-11-06 DIAGNOSIS — M48061 Spinal stenosis, lumbar region without neurogenic claudication: Secondary | ICD-10-CM

## 2011-11-06 DIAGNOSIS — M47817 Spondylosis without myelopathy or radiculopathy, lumbosacral region: Secondary | ICD-10-CM | POA: Diagnosis not present

## 2011-11-06 DIAGNOSIS — M542 Cervicalgia: Secondary | ICD-10-CM

## 2011-11-06 DIAGNOSIS — M5020 Other cervical disc displacement, unspecified cervical region: Secondary | ICD-10-CM | POA: Diagnosis not present

## 2011-11-06 DIAGNOSIS — M5030 Other cervical disc degeneration, unspecified cervical region: Secondary | ICD-10-CM | POA: Diagnosis not present

## 2011-11-06 DIAGNOSIS — R29898 Other symptoms and signs involving the musculoskeletal system: Secondary | ICD-10-CM | POA: Diagnosis not present

## 2011-11-06 DIAGNOSIS — M5126 Other intervertebral disc displacement, lumbar region: Secondary | ICD-10-CM | POA: Diagnosis not present

## 2011-11-06 DIAGNOSIS — M47812 Spondylosis without myelopathy or radiculopathy, cervical region: Secondary | ICD-10-CM | POA: Diagnosis not present

## 2011-11-09 DIAGNOSIS — M4712 Other spondylosis with myelopathy, cervical region: Secondary | ICD-10-CM | POA: Diagnosis not present

## 2011-11-09 DIAGNOSIS — M4802 Spinal stenosis, cervical region: Secondary | ICD-10-CM | POA: Diagnosis not present

## 2011-11-10 ENCOUNTER — Encounter (HOSPITAL_COMMUNITY): Payer: Self-pay | Admitting: Pharmacist

## 2011-11-12 DIAGNOSIS — E1142 Type 2 diabetes mellitus with diabetic polyneuropathy: Secondary | ICD-10-CM | POA: Diagnosis not present

## 2011-11-12 DIAGNOSIS — D649 Anemia, unspecified: Secondary | ICD-10-CM | POA: Diagnosis not present

## 2011-11-12 DIAGNOSIS — R5381 Other malaise: Secondary | ICD-10-CM | POA: Diagnosis not present

## 2011-11-12 DIAGNOSIS — E1149 Type 2 diabetes mellitus with other diabetic neurological complication: Secondary | ICD-10-CM | POA: Diagnosis not present

## 2011-11-12 DIAGNOSIS — R5383 Other fatigue: Secondary | ICD-10-CM | POA: Diagnosis not present

## 2011-11-12 DIAGNOSIS — I10 Essential (primary) hypertension: Secondary | ICD-10-CM | POA: Diagnosis not present

## 2011-11-16 ENCOUNTER — Other Ambulatory Visit: Payer: Self-pay | Admitting: Neurological Surgery

## 2011-11-16 ENCOUNTER — Encounter (HOSPITAL_COMMUNITY): Payer: Self-pay

## 2011-11-16 ENCOUNTER — Encounter (HOSPITAL_COMMUNITY)
Admission: RE | Admit: 2011-11-16 | Discharge: 2011-11-16 | Disposition: A | Discharge status: Home / Self Care | Payer: Medicare Other | Source: Ambulatory Visit | Attending: Neurological Surgery | Admitting: Neurological Surgery

## 2011-11-16 HISTORY — DX: Myoneural disorder, unspecified: G70.9

## 2011-11-16 HISTORY — DX: Unspecified cataract: H26.9

## 2011-11-16 HISTORY — DX: Malignant (primary) neoplasm, unspecified: C80.1

## 2011-11-16 HISTORY — DX: Frequency of micturition: R35.0

## 2011-11-16 HISTORY — DX: Syncope and collapse: R55

## 2011-11-16 HISTORY — DX: Hyperlipidemia, unspecified: E78.5

## 2011-11-16 HISTORY — DX: Pneumonia, unspecified organism: J18.9

## 2011-11-16 LAB — CBC
MCH: 31.1 pg (ref 26.0–34.0)
MCV: 92.3 fL (ref 78.0–100.0)
Platelets: 167 10*3/uL (ref 150–400)
RDW: 15 % (ref 11.5–15.5)
WBC: 6.8 10*3/uL (ref 4.0–10.5)

## 2011-11-16 LAB — BASIC METABOLIC PANEL
CO2: 25 mEq/L (ref 19–32)
Calcium: 9.7 mg/dL (ref 8.4–10.5)
Creatinine, Ser: 1.17 mg/dL (ref 0.50–1.35)
GFR calc non Af Amer: 58 mL/min — ABNORMAL LOW (ref >90)
Sodium: 139 mEq/L (ref 135–145)

## 2011-11-16 LAB — SURGICAL PCR SCREEN: MRSA, PCR: NEGATIVE

## 2011-11-16 NOTE — Pre-Procedure Instructions (Signed)
20 [name]  11/16/2011   Your procedure is scheduled on:  Thursday November 18, 2011  Report to [facility] at 0530 AM.  Call this number if you have problems the morning of surgery: [phone]   Remember:   Do not eat food or drink After Midnight.    Take these medicines the morning of surgery with A SIP OF WATER: Coreg, labetalol   Do not wear jewelry, make-up or nail polish.  Do not wear lotions, powders, or perfumes. You may wear deodorant.  Do not shave 48 hours prior to surgery. Men may shave face and neck.  Do not bring valuables to the hospital.  Contacts, dentures or bridgework may not be worn into surgery.  Leave suitcase in the car. After surgery it may be brought to your room.  For patients admitted to the hospital, checkout time is 11:00 AM the day of discharge.   Patients discharged the day of surgery will not be allowed to drive home.  Name and phone number of your driver: [name] [phone]  Special Instructions: CHG Shower Use Special Wash: 1/2 bottle night before surgery and 1/2 bottle morning of surgery.   Please read over the following fact sheets that you were given: Pain Booklet, Coughing and Deep Breathing, MRSA Information and Surgical Site Infection Prevention

## 2011-11-17 ENCOUNTER — Encounter (HOSPITAL_COMMUNITY): Payer: Self-pay | Admitting: Vascular Surgery

## 2011-11-17 MED ORDER — CEFAZOLIN SODIUM-DEXTROSE 2-3 GM-% IV SOLR
2.0000 g | INTRAVENOUS | Status: AC
  Administered 2011-11-18: 2 g via INTRAVENOUS
  Filled 2011-11-17 (×2): qty 50

## 2011-11-17 NOTE — Consult Note (Signed)
Anesthesia Chart Review:  Patient is a 78 year old male posted for one level posterior cervical fusion by Dr. Yetta Barre on 11/18/11.  History includes non-smoker, obesity with BMI 32.9, DM2, HTN, syncope '08, ED, HLD, prostate cancer s/p cryoablation '06.  PCP is listed as Dr. Reather Littler [phone]).   CXR on 10/15/11 showed stable cardiopulmonary appearance with no new focal or acute abnormality noted.  Labs noted.  Cr 1.17, glucose 72, H/H 11.8/35.0.  EKG from 10/18/11 showed SR, first degree AVB, left BBB, non-specific T wave abnormality.  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  I called and spoke with [name].  He denies CP, SOB.  He has had no further syncope since 2008.  He is able to do his day to day activities, but has to ambulate with a cane.  He denies known history of prior echo, stress, or cath.  I reviewed above with Anesthesiologist Dr. Michelle Piper.  Since patient has known left BBB and is asymptomatic, then plan to proceed.  Shonna Chock, PA-C

## 2011-11-18 ENCOUNTER — Encounter (HOSPITAL_COMMUNITY): Payer: Self-pay | Admitting: Vascular Surgery

## 2011-11-18 ENCOUNTER — Encounter (HOSPITAL_COMMUNITY)
Admission: RE | Disposition: A | Discharge status: Home / Self Care | Payer: Self-pay | Source: Ambulatory Visit | Attending: Neurological Surgery

## 2011-11-18 ENCOUNTER — Inpatient Hospital Stay (HOSPITAL_COMMUNITY): Payer: Medicare Other | Admitting: Vascular Surgery

## 2011-11-18 ENCOUNTER — Encounter (HOSPITAL_COMMUNITY): Payer: Self-pay | Admitting: Neurological Surgery

## 2011-11-18 ENCOUNTER — Inpatient Hospital Stay (HOSPITAL_COMMUNITY)
Admission: RE | Admit: 2011-11-18 | Discharge: 2011-11-20 | DRG: 472 | Disposition: A | Discharge status: Home / Self Care | Payer: Medicare Other | Source: Ambulatory Visit | Attending: Neurological Surgery | Admitting: Neurological Surgery

## 2011-11-18 ENCOUNTER — Inpatient Hospital Stay (HOSPITAL_COMMUNITY): Payer: Medicare Other

## 2011-11-18 DIAGNOSIS — M542 Cervicalgia: Secondary | ICD-10-CM | POA: Diagnosis not present

## 2011-11-18 DIAGNOSIS — Z01812 Encounter for preprocedural laboratory examination: Secondary | ICD-10-CM | POA: Diagnosis not present

## 2011-11-18 DIAGNOSIS — I509 Heart failure, unspecified: Secondary | ICD-10-CM | POA: Diagnosis present

## 2011-11-18 DIAGNOSIS — M4712 Other spondylosis with myelopathy, cervical region: Secondary | ICD-10-CM | POA: Diagnosis not present

## 2011-11-18 DIAGNOSIS — I10 Essential (primary) hypertension: Secondary | ICD-10-CM | POA: Diagnosis present

## 2011-11-18 DIAGNOSIS — E785 Hyperlipidemia, unspecified: Secondary | ICD-10-CM | POA: Diagnosis present

## 2011-11-18 DIAGNOSIS — E119 Type 2 diabetes mellitus without complications: Secondary | ICD-10-CM | POA: Diagnosis not present

## 2011-11-18 DIAGNOSIS — M4802 Spinal stenosis, cervical region: Secondary | ICD-10-CM | POA: Diagnosis not present

## 2011-11-18 DIAGNOSIS — M5000 Cervical disc disorder with myelopathy, unspecified cervical region: Secondary | ICD-10-CM | POA: Diagnosis not present

## 2011-11-18 DIAGNOSIS — R339 Retention of urine, unspecified: Secondary | ICD-10-CM | POA: Diagnosis not present

## 2011-11-18 DIAGNOSIS — M4310 Spondylolisthesis, site unspecified: Secondary | ICD-10-CM | POA: Diagnosis not present

## 2011-11-18 DIAGNOSIS — M47812 Spondylosis without myelopathy or radiculopathy, cervical region: Secondary | ICD-10-CM | POA: Diagnosis not present

## 2011-11-18 HISTORY — PX: POSTERIOR CERVICAL FUSION/FORAMINOTOMY: SHX5038

## 2011-11-18 LAB — COMPREHENSIVE METABOLIC PANEL
AST: 18 U/L (ref 0–37)
Albumin: 3.6 g/dL (ref 3.5–5.2)
BUN: 22 mg/dL (ref 6–23)
CO2: 24 mEq/L (ref 19–32)
Calcium: 9.3 mg/dL (ref 8.4–10.5)
Creatinine, Ser: 1.21 mg/dL (ref 0.50–1.35)
GFR calc non Af Amer: 56 mL/min — ABNORMAL LOW (ref >90)

## 2011-11-18 LAB — GLUCOSE, CAPILLARY
Glucose-Capillary: 76 mg/dL (ref 70–99)
Glucose-Capillary: 156 mg/dL — ABNORMAL HIGH (ref 70–99)
Glucose-Capillary: 190 mg/dL — ABNORMAL HIGH (ref 70–99)

## 2011-11-18 LAB — CBC
HCT: 33.3 % — ABNORMAL LOW (ref 39.0–52.0)
MCH: 31.9 pg (ref 26.0–34.0)
MCV: 92.5 fL (ref 78.0–100.0)
Platelets: 164 10*3/uL (ref 150–400)
RDW: 14.9 % (ref 11.5–15.5)

## 2011-11-18 LAB — DIFFERENTIAL
Basophils Absolute: 0 10*3/uL (ref 0.0–0.1)
Basophils Relative: 0 % (ref 0–1)
Lymphocytes Relative: 24 % (ref 12–46)
Monocytes Absolute: 0.6 10*3/uL (ref 0.1–1.0)
Neutro Abs: 3.8 10*3/uL (ref 1.7–7.7)
Neutrophils Relative %: 64 % (ref 43–77)

## 2011-11-18 LAB — PROTIME-INR: INR: 1.07 (ref 0.00–1.49)

## 2011-11-18 SURGERY — POSTERIOR CERVICAL FUSION/FORAMINOTOMY LEVEL 1
Anesthesia: General | Site: Head | Laterality: Bilateral | Wound class: Clean

## 2011-11-18 MED ORDER — SODIUM CHLORIDE 0.9 % IV SOLN
250.0000 mL | INTRAVENOUS | Status: DC
Start: 2011-11-18 — End: 2011-11-20

## 2011-11-18 MED ORDER — PHENOL 1.4 % MT LIQD: 1.0000 | OROMUCOSAL | Status: DC | PRN

## 2011-11-18 MED ORDER — INSULIN GLARGINE 100 UNIT/ML ~~LOC~~ SOLN
40.0000 [IU] | Freq: Every day | SUBCUTANEOUS | Status: DC
  Administered 2011-11-18: 40 [IU] via SUBCUTANEOUS

## 2011-11-18 MED ORDER — LACTATED RINGERS IV SOLN
INTRAVENOUS | Status: DC | PRN
  Administered 2011-11-18 (×2): via INTRAVENOUS

## 2011-11-18 MED ORDER — PROPOFOL 10 MG/ML IV EMUL
INTRAVENOUS | Status: DC | PRN
  Administered 2011-11-18: 110 mg via INTRAVENOUS

## 2011-11-18 MED ORDER — LABETALOL HCL 200 MG PO TABS
200.0000 mg | ORAL_TABLET | Freq: Every morning | ORAL | Status: DC
Start: 2011-11-18 — End: 2011-11-20
  Administered 2011-11-18 – 2011-11-20 (×3): 200 mg via ORAL
  Filled 2011-11-18 (×3): qty 1

## 2011-11-18 MED ORDER — EPHEDRINE SULFATE 50 MG/ML IJ SOLN
INTRAMUSCULAR | Status: DC | PRN
  Administered 2011-11-18 (×5): 5 mg via INTRAVENOUS
  Administered 2011-11-18 (×2): 10 mg via INTRAVENOUS
  Administered 2011-11-18: 5 mg via INTRAVENOUS
  Administered 2011-11-18: 10 mg via INTRAVENOUS
  Administered 2011-11-18: 5 mg via INTRAVENOUS
  Administered 2011-11-18 (×2): 10 mg via INTRAVENOUS
  Administered 2011-11-18: 5 mg via INTRAVENOUS
  Administered 2011-11-18: 10 mg via INTRAVENOUS

## 2011-11-18 MED ORDER — ACETAMINOPHEN 650 MG RE SUPP: 650.0000 mg | RECTAL | Status: DC | PRN

## 2011-11-18 MED ORDER — OXYCODONE-ACETAMINOPHEN 5-325 MG PO TABS
1.0000 | ORAL_TABLET | ORAL | Status: DC | PRN
  Administered 2011-11-18: 1 via ORAL
  Administered 2011-11-18 – 2011-11-20 (×4): 2 via ORAL
  Filled 2011-11-18 (×2): qty 2
  Filled 2011-11-18: qty 1
  Filled 2011-11-18 (×2): qty 2

## 2011-11-18 MED ORDER — POTASSIUM CHLORIDE IN NACL 20-0.9 MEQ/L-% IV SOLN
INTRAVENOUS | Status: DC
  Administered 2011-11-18: 75 mL/h via INTRAVENOUS
  Filled 2011-11-18 (×5): qty 1000

## 2011-11-18 MED ORDER — CEFAZOLIN SODIUM 1-5 GM-% IV SOLN
1.0000 g | Freq: Three times a day (TID) | INTRAVENOUS | Status: AC
  Administered 2011-11-18 (×2): 1 g via INTRAVENOUS
  Filled 2011-11-18 (×2): qty 50

## 2011-11-18 MED ORDER — SODIUM CHLORIDE 0.9 % IV SOLN
INTRAVENOUS | Status: AC
  Filled 2011-11-18: qty 500

## 2011-11-18 MED ORDER — MORPHINE SULFATE 2 MG/ML IJ SOLN: 1.0000 mg | INTRAMUSCULAR | Status: DC | PRN

## 2011-11-18 MED ORDER — MIDAZOLAM HCL 5 MG/5ML IJ SOLN
INTRAMUSCULAR | Status: DC | PRN
  Administered 2011-11-18: 2 mg via INTRAVENOUS

## 2011-11-18 MED ORDER — CYCLOBENZAPRINE HCL 10 MG PO TABS
10.0000 mg | ORAL_TABLET | Freq: Three times a day (TID) | ORAL | Status: DC | PRN
  Administered 2011-11-18 – 2011-11-19 (×3): 10 mg via ORAL
  Filled 2011-11-18 (×3): qty 1

## 2011-11-18 MED ORDER — SODIUM CHLORIDE 0.9 % IR SOLN
Status: DC | PRN
  Administered 2011-11-18: 08:00:00

## 2011-11-18 MED ORDER — LABETALOL HCL 200 MG PO TABS
200.0000 mg | ORAL_TABLET | Freq: Once | ORAL | Status: AC
  Administered 2011-11-18: 200 mg via ORAL
  Filled 2011-11-18: qty 1

## 2011-11-18 MED ORDER — PNEUMOCOCCAL VAC POLYVALENT 25 MCG/0.5ML IJ INJ
0.5000 mL | INJECTION | INTRAMUSCULAR | Status: AC
  Administered 2011-11-19: 0.5 mL via INTRAMUSCULAR
  Filled 2011-11-18: qty 0.5

## 2011-11-18 MED ORDER — CARVEDILOL 12.5 MG PO TABS
12.5000 mg | ORAL_TABLET | Freq: Two times a day (BID) | ORAL | Status: DC
  Administered 2011-11-18 – 2011-11-20 (×4): 12.5 mg via ORAL
  Filled 2011-11-18 (×6): qty 1

## 2011-11-18 MED ORDER — ONDANSETRON HCL 4 MG/2ML IJ SOLN: 4.0000 mg | INTRAMUSCULAR | Status: DC | PRN

## 2011-11-18 MED ORDER — ONDANSETRON HCL 4 MG/2ML IJ SOLN
INTRAMUSCULAR | Status: DC | PRN
  Administered 2011-11-18: 4 mg via INTRAVENOUS

## 2011-11-18 MED ORDER — PIOGLITAZONE HCL-METFORMIN HCL 15-850 MG PO TABS: 1.0000 | ORAL_TABLET | Freq: Two times a day (BID) | ORAL | Status: DC

## 2011-11-18 MED ORDER — HYDROCHLOROTHIAZIDE 25 MG PO TABS
25.0000 mg | ORAL_TABLET | Freq: Every morning | ORAL | Status: DC
  Administered 2011-11-18 – 2011-11-20 (×3): 25 mg via ORAL
  Filled 2011-11-18 (×3): qty 1

## 2011-11-18 MED ORDER — LIDOCAINE HCL (CARDIAC) 20 MG/ML IV SOLN
INTRAVENOUS | Status: DC | PRN
  Administered 2011-11-18: 100 mg via INTRAVENOUS

## 2011-11-18 MED ORDER — METFORMIN HCL 850 MG PO TABS
850.0000 mg | ORAL_TABLET | Freq: Two times a day (BID) | ORAL | Status: DC
  Administered 2011-11-18 – 2011-11-20 (×4): 850 mg via ORAL
  Filled 2011-11-18 (×6): qty 1

## 2011-11-18 MED ORDER — DEXAMETHASONE SODIUM PHOSPHATE 10 MG/ML IJ SOLN
INTRAMUSCULAR | Status: AC
  Administered 2011-11-18: 10 mg via INTRAVENOUS
  Filled 2011-11-18: qty 1

## 2011-11-18 MED ORDER — GLYCOPYRROLATE 0.2 MG/ML IJ SOLN
INTRAMUSCULAR | Status: DC | PRN
Start: 2011-11-18 — End: 2011-11-18
  Administered 2011-11-18: 0.2 mg via INTRAVENOUS
  Administered 2011-11-18: .8 mg via INTRAVENOUS

## 2011-11-18 MED ORDER — NEOSTIGMINE METHYLSULFATE 1 MG/ML IJ SOLN
INTRAMUSCULAR | Status: DC | PRN
  Administered 2011-11-18: 5 mg via INTRAVENOUS

## 2011-11-18 MED ORDER — CARVEDILOL 12.5 MG PO TABS
12.5000 mg | ORAL_TABLET | Freq: Two times a day (BID) | ORAL | Status: AC
  Administered 2011-11-18: 12.5 mg via ORAL
  Filled 2011-11-18: qty 1

## 2011-11-18 MED ORDER — BACITRACIN 50000 UNITS IM SOLR
INTRAMUSCULAR | Status: AC
  Filled 2011-11-18: qty 1

## 2011-11-18 MED ORDER — IRBESARTAN 300 MG PO TABS
300.0000 mg | ORAL_TABLET | Freq: Every morning | ORAL | Status: DC
  Administered 2011-11-18 – 2011-11-20 (×3): 300 mg via ORAL
  Filled 2011-11-18 (×3): qty 1

## 2011-11-18 MED ORDER — MENTHOL 3 MG MT LOZG
1.0000 | LOZENGE | OROMUCOSAL | Status: DC | PRN
  Administered 2011-11-19: 3 mg via ORAL
  Filled 2011-11-18: qty 9

## 2011-11-18 MED ORDER — HYDROMORPHONE HCL PF 1 MG/ML IJ SOLN
0.2500 mg | INTRAMUSCULAR | Status: DC | PRN
  Administered 2011-11-18: 0.25 mg via INTRAVENOUS
  Administered 2011-11-18: 0.5 mg via INTRAVENOUS
  Administered 2011-11-18: 0.25 mg via INTRAVENOUS

## 2011-11-18 MED ORDER — HYDROMORPHONE HCL PF 1 MG/ML IJ SOLN
INTRAMUSCULAR | Status: AC
  Filled 2011-11-18: qty 1

## 2011-11-18 MED ORDER — FENTANYL CITRATE 0.05 MG/ML IJ SOLN
INTRAMUSCULAR | Status: DC | PRN
  Administered 2011-11-18: 50 ug via INTRAVENOUS
  Administered 2011-11-18: 100 ug via INTRAVENOUS

## 2011-11-18 MED ORDER — PIOGLITAZONE HCL 15 MG PO TABS
15.0000 mg | ORAL_TABLET | Freq: Two times a day (BID) | ORAL | Status: DC
  Administered 2011-11-18 – 2011-11-20 (×4): 15 mg via ORAL
  Filled 2011-11-18 (×6): qty 1

## 2011-11-18 MED ORDER — SENNA 8.6 MG PO TABS
1.0000 | ORAL_TABLET | Freq: Two times a day (BID) | ORAL | Status: DC
  Administered 2011-11-18 – 2011-11-20 (×4): 8.6 mg via ORAL
  Filled 2011-11-18 (×6): qty 1

## 2011-11-18 MED ORDER — SODIUM CHLORIDE 0.9 % IJ SOLN
3.0000 mL | Freq: Two times a day (BID) | INTRAMUSCULAR | Status: DC
  Administered 2011-11-18 – 2011-11-19 (×4): 3 mL via INTRAVENOUS

## 2011-11-18 MED ORDER — HEMOSTATIC AGENTS (NO CHARGE) OPTIME
TOPICAL | Status: DC | PRN
  Administered 2011-11-18: 1 via TOPICAL

## 2011-11-18 MED ORDER — BUPIVACAINE HCL (PF) 0.25 % IJ SOLN
INTRAMUSCULAR | Status: DC | PRN
  Administered 2011-11-18: 30 mL

## 2011-11-18 MED ORDER — LABETALOL HCL 5 MG/ML IV SOLN
INTRAVENOUS | Status: DC | PRN
  Administered 2011-11-18: 5 mg via INTRAVENOUS

## 2011-11-18 MED ORDER — THROMBIN 5000 UNITS EX KIT
PACK | CUTANEOUS | Status: DC | PRN
  Administered 2011-11-18 (×2): 5000 [IU] via TOPICAL

## 2011-11-18 MED ORDER — SODIUM CHLORIDE 0.9 % IJ SOLN
3.0000 mL | INTRAMUSCULAR | Status: DC | PRN
  Administered 2011-11-19: 3 mL via INTRAVENOUS

## 2011-11-18 MED ORDER — ACETAMINOPHEN 325 MG PO TABS: 650.0000 mg | ORAL_TABLET | ORAL | Status: DC | PRN

## 2011-11-18 MED ORDER — INSULIN ASPART 100 UNIT/ML ~~LOC~~ SOLN
20.0000 [IU] | Freq: Three times a day (TID) | SUBCUTANEOUS | Status: DC
  Administered 2011-11-18: 20 [IU] via SUBCUTANEOUS

## 2011-11-18 MED ORDER — 0.9 % SODIUM CHLORIDE (POUR BTL) OPTIME
TOPICAL | Status: DC | PRN
  Administered 2011-11-18: 1000 mL

## 2011-11-18 MED ORDER — ROCURONIUM BROMIDE 100 MG/10ML IV SOLN
INTRAVENOUS | Status: DC | PRN
  Administered 2011-11-18: 5 mg via INTRAVENOUS
  Administered 2011-11-18: 50 mg via INTRAVENOUS
  Administered 2011-11-18: 10 mg via INTRAVENOUS

## 2011-11-18 MED ORDER — DEXAMETHASONE SODIUM PHOSPHATE 10 MG/ML IJ SOLN: 10.0000 mg | INTRAMUSCULAR | Status: DC

## 2011-11-18 MED ORDER — ONDANSETRON HCL 4 MG/2ML IJ SOLN: 4.0000 mg | Freq: Once | INTRAMUSCULAR | Status: DC | PRN

## 2011-11-18 MED ORDER — PNEUMOCOCCAL 13-VAL CONJ VACC IM SUSP: 0.5000 mL | INTRAMUSCULAR | Status: DC

## 2011-11-18 SURGICAL SUPPLY — 59 items
3.5x14mm ×2 IMPLANT
APL SKNCLS STERI-STRIP NONHPOA (GAUZE/BANDAGES/DRESSINGS) ×1
BAG DECANTER FOR FLEXI CONT (MISCELLANEOUS) ×2 IMPLANT
BENZOIN TINCTURE PRP APPL 2/3 (GAUZE/BANDAGES/DRESSINGS) ×2 IMPLANT
BIT DRILL MOUNTAINEER FIX 14 (BIT) ×1
BIT DRILL MOUNTAINEER FIX 14MM (BIT) ×1 IMPLANT
BLADE SURG ROTATE 9660 (MISCELLANEOUS) IMPLANT
BUR MATCHSTICK NEURO 3.0 LAGG (BURR) IMPLANT
CANISTER SUCTION 2500CC (MISCELLANEOUS) ×2 IMPLANT
CLOTH BEACON ORANGE TIMEOUT ST (SAFETY) ×2 IMPLANT
CONT SPEC 4OZ CLIKSEAL STRL BL (MISCELLANEOUS) ×2 IMPLANT
DRAPE C-ARM 42X72 X-RAY (DRAPES) ×4 IMPLANT
DRAPE LAPAROTOMY 100X72 PEDS (DRAPES) ×2 IMPLANT
DRAPE POUCH INSTRU U-SHP 10X18 (DRAPES) ×2 IMPLANT
DRESSING TELFA 8X3 (GAUZE/BANDAGES/DRESSINGS) ×2 IMPLANT
DRILL BIT MOUNTAINEER FIX 14MM (BIT) ×2
DRSG OPSITE 4X5.5 SM (GAUZE/BANDAGES/DRESSINGS) ×2 IMPLANT
DURAPREP 26ML APPLICATOR (WOUND CARE) ×2 IMPLANT
ELECT REM PT RETURN 9FT ADLT (ELECTROSURGICAL) ×2
ELECTRODE REM PT RTRN 9FT ADLT (ELECTROSURGICAL) ×1 IMPLANT
EVACUATOR 1/8 PVC DRAIN (DRAIN) ×1 IMPLANT
GAUZE SPONGE 4X4 16PLY XRAY LF (GAUZE/BANDAGES/DRESSINGS) IMPLANT
GLOVE BIO SURGEON STRL SZ8 (GLOVE) ×3 IMPLANT
GLOVE BIOGEL PI IND STRL 6.5 (GLOVE) ×2 IMPLANT
GLOVE BIOGEL PI IND STRL 8.5 (GLOVE) IMPLANT
GLOVE BIOGEL PI INDICATOR 6.5 (GLOVE) ×2
GLOVE BIOGEL PI INDICATOR 8.5 (GLOVE) ×1
GLOVE SKINSENSE NS SZ7.0 (GLOVE) ×4
GLOVE SKINSENSE STRL SZ7.0 (GLOVE) IMPLANT
GOWN BRE IMP SLV AUR LG STRL (GOWN DISPOSABLE) IMPLANT
GOWN BRE IMP SLV AUR XL STRL (GOWN DISPOSABLE) ×5 IMPLANT
GOWN STRL REIN 2XL LVL4 (GOWN DISPOSABLE) IMPLANT
HEMOSTAT POWDER KIT SURGIFOAM (HEMOSTASIS) IMPLANT
KIT BASIN OR (CUSTOM PROCEDURE TRAY) ×2 IMPLANT
KIT ROOM TURNOVER OR (KITS) ×2 IMPLANT
MARKER SKIN DUAL TIP RULER LAB (MISCELLANEOUS) ×2 IMPLANT
NDL HYPO 25X1 1.5 SAFETY (NEEDLE) ×1 IMPLANT
NEEDLE HYPO 22GX1.5 SAFETY (NEEDLE) ×1 IMPLANT
NEEDLE HYPO 25X1 1.5 SAFETY (NEEDLE) ×2 IMPLANT
NEEDLE SPNL 20GX3.5 QUINCKE YW (NEEDLE) IMPLANT
NS IRRIG 1000ML POUR BTL (IV SOLUTION) ×2 IMPLANT
PACK LAMINECTOMY NEURO (CUSTOM PROCEDURE TRAY) ×2 IMPLANT
PAD ARMBOARD 7.5X6 YLW CONV (MISCELLANEOUS) ×2 IMPLANT
PIN MAYFIELD SKULL DISP (PIN) ×2 IMPLANT
ROD MOUNTAINEER 3.5X60 (Rod) ×1 IMPLANT
SCREW INNER (Screw) ×4 IMPLANT
SCREW MOUNTAINEER ML 4X20MM (Screw) IMPLANT
SPONGE LAP 4X18 X RAY DECT (DISPOSABLE) IMPLANT
SPONGE SURGIFOAM ABS GEL SZ50 (HEMOSTASIS) ×2 IMPLANT
STRIP CLOSURE SKIN 1/2X4 (GAUZE/BANDAGES/DRESSINGS) ×2 IMPLANT
SUT VIC AB 0 CT1 18XCR BRD8 (SUTURE) ×1 IMPLANT
SUT VIC AB 0 CT1 8-18 (SUTURE) ×2
SUT VIC AB 2-0 CP2 18 (SUTURE) ×2 IMPLANT
SUT VIC AB 3-0 SH 8-18 (SUTURE) ×2 IMPLANT
SYR 20ML ECCENTRIC (SYRINGE) ×2 IMPLANT
TOWEL OR 17X24 6PK STRL BLUE (TOWEL DISPOSABLE) ×2 IMPLANT
TOWEL OR 17X26 10 PK STRL BLUE (TOWEL DISPOSABLE) ×2 IMPLANT
WATER STERILE IRR 1000ML POUR (IV SOLUTION) ×2 IMPLANT
mountaneer 4.0x20mm ×2 IMPLANT

## 2011-11-18 NOTE — Op Note (Signed)
11/18/2011  10:29 AM  PATIENT:  [name]  78 y.o. male  PRE-OPERATIVE DIAGNOSIS:  Cervical spondylosis/ spondylolisthesis C7-T1 with stenosis and myelopathy  POST-OPERATIVE DIAGNOSIS:  same  PROCEDURE:  1. Decompressive posterior cervical laminectomy C7 and the top of T1  with medial facetectomies and foraminotomies, 2. Posterior cervical arthrodesis C7-T1 utilizing morcellized autograft, 3. Posterior cervical fixation C7-T1 utilizing the Mountaineer system  SURGEON:  Marikay Alar, MD  ASSISTANTS: Venetia Maxon  ANESTHESIA:   General  EBL: 175 ml  Total I/O In: 1000 [I.V.:1000] Out: 175 [Blood:175]  BLOOD ADMINISTERED:none  DRAINS: hemovac   SPECIMEN:  No Specimen  INDICATION FOR PROCEDURE: This patient presented with numbness and weakness in his hands with progressive gait difficulty. MRI showed spondylolisthesis with stenosis C7-T1 with signal change in the cord at that level. Recommended decompressive laminectomy instrumented fusion.  Patient understood the risks, benefits, and alternatives and potential outcomes and wished to proceed.  PROCEDURE DETAILS: The patient was brought to the operating room. Generalized endotracheal anesthesia was induced. The patient was affixed a 3 point [place] headrest and rolled into the prone position on chest rolls. All pressure points were padded. The posterior cervical region was cleaned and prepped with DuraPrep and then draped in the usual sterile fashion. 7 cc of local anesthesia was injected and a dorsal midline incision made in the posterior cervical region and carried down to the cervical fascia. The fascia was opened and the paraspinous musculature was taken down to expose C7-T1. Intraoperative fluoroscopy confirmed my level and then the dissection was carried out over the lateral facets. I localized the midpoint of each lateral mass and marked a region 1 mm medial to the midpoint of the lateral mass, and then drilled in an upper and outward  direction into the safe zone of each lateral mass of C7. I drilled to a depth of 14 mm and then checked my drill hole with a ball probe. I then placed a 14 mm lateral mass screws into the safe zone of each lateral mass of C7 until they were 2 fingers tight. I then decompressed the central canal with the 1 and 2 mm Kerrison punch from C7 to T1. Medial facetectomies were performed, and foraminotomies were performed at C7-T1. Once the decompression was complete the dura was full and capacious and I could see the spinal cord pulsatile through the dura.  I then could palpate the pedicles of T1 and was able to then a 5 my pedicle screw entry zone for T1. I decorticated entries known and then used the hand drill set at 18 mm depth to drill the pedicle of T1 bilaterally. I then palpated with a ball probe in and placed my T1 pedicle screws. I then decorticated the lateral masses and the facet joints and packed them with local autograft and morcellized allograft to perform arthrodesis from C7-T1. I then placed rods into the multiaxial screw heads of the screws and locked these in position with the locking caps and anti-torque device. I then checked the final construct with fluoroscopy. I irrigated with saline solution containing bacitracin. A placed a medium Hemovac drain through separate stab incision, and lying the dura with Gelfoam. After hemostasis was achieved a closed the muscle and the fascia with 0 Vicryl, subcutaneous tissue with 2-0 Vicryl, and the subcuticular tissue with 3-0 Vicryl. The skin was closed with benzoin and Steri-Strips. A sterile dressing was applied, the patient was turned to the supine physician and taken out of the headrest, awakened from  general anesthesia and transferred to the recovery room in stable condition. At the end of the procedure all sponge, needle and instrument counts were correct.   PLAN OF CARE: Admit to inpatient   PATIENT DISPOSITION:  PACU - hemodynamically stable.   Delay  start of Pharmacological VTE agent (>24hrs) due to surgical blood loss or risk of bleeding:  yes

## 2011-11-18 NOTE — Anesthesia Postprocedure Evaluation (Signed)
  Anesthesia Post-op Note  Patient: [name]  Procedure(s) Performed: Procedure(s) (LRB): POSTERIOR CERVICAL FUSION/FORAMINOTOMY LEVEL 1 (Bilateral)  Patient Location: PACU  Anesthesia Type: General  Level of Consciousness: awake, alert  and oriented  Airway and Oxygen Therapy: Patient Spontanous Breathing and Patient connected to nasal cannula oxygen  Post-op Pain: mild  Post-op Assessment: Post-op Vital signs reviewed  Post-op Vital Signs: Reviewed  Complications: No apparent anesthesia complications

## 2011-11-18 NOTE — Progress Notes (Signed)
Spoke with Britta Mccreedy with Nuclear Medicine states that she will put patient on for bone scan tomorrow.

## 2011-11-18 NOTE — Transfer of Care (Signed)
Immediate Anesthesia Transfer of Care Note  Patient: [name]  Procedure(s) Performed: Procedure(s) (LRB): POSTERIOR CERVICAL FUSION/FORAMINOTOMY LEVEL 1 (Bilateral)  Patient Location: PACU  Anesthesia Type: General  Level of Consciousness: awake, alert  and oriented  Airway & Oxygen Therapy: Patient Spontanous Breathing  Post-op Assessment: Report given to PACU RN and Post -op Vital signs reviewed and stable  Post vital signs: Reviewed and stable  Complications: No apparent anesthesia complications

## 2011-11-18 NOTE — Anesthesia Preprocedure Evaluation (Addendum)
Anesthesia Evaluation  Patient identified by MRN, date of birth, ID band Patient awake    Reviewed: Allergy & Precautions, H&P , NPO status , Patient's Chart, lab work & pertinent test results, reviewed documented beta blocker date and time   Airway Mallampati: I TM Distance: >3 FB Neck ROM: Limited    Dental  (+) Edentulous Upper, Dental Advisory Given and Teeth Intact   Pulmonary pneumonia -, resolved,  breath sounds clear to auscultation        Cardiovascular hypertension, Pt. on medications and Pt. on home beta blockers +CHF Rhythm:Regular Rate:Normal     Neuro/Psych    GI/Hepatic   Endo/Other  Well Controlled, Type 2, Insulin Dependent  Renal/GU      Musculoskeletal   Abdominal   Peds  Hematology   Anesthesia Other Findings   Reproductive/Obstetrics                          Anesthesia Physical Anesthesia Plan  ASA: III  Anesthesia Plan: General   Post-op Pain Management:    Induction: Intravenous  Airway Management Planned: Oral ETT  Additional Equipment:   Intra-op Plan:   Post-operative Plan:   Informed Consent: I have reviewed the patients History and Physical, chart, labs and discussed the procedure including the risks, benefits and alternatives for the proposed anesthesia with the patient or authorized representative who has indicated his/her understanding and acceptance.   Dental advisory given  Plan Discussed with: CRNA, Anesthesiologist and Surgeon  Anesthesia Plan Comments:         Anesthesia Quick Evaluation

## 2011-11-18 NOTE — Preoperative (Signed)
Beta Blockers   Reason not to administer Beta Blockers:Not Applicable 

## 2011-11-18 NOTE — H&P (Signed)
Subjective:   Patient is a 78 y.o. male admitted for Cervical stenosis with myelopathy. The patient first presented to me with complaints of weakness in his hands and difficulty with gait.  nset of symptoms was a few months ago. The pain is described as dull and occurs intermittently. The pain is rated mild, and is located at the without radiation. The symptoms have been progressive. Symptoms are exacerbated by extending head backwards, and are relieved by none.  Previous work up includes MRI of cervical spine, results: spinal stenosis.  Past Medical History  Diagnosis Date  . Diabetes mellitus   . ED (erectile dysfunction)   . Hypertension   . Hyperlipidemia   . Pneumonia     hx of  . CHF (congestive heart failure)   . Urination frequency   . Cancer     hx of prostate ca  . Neuromuscular disorder     numbness in hand/cervical issues  . Cataracts, bilateral     hx of  . Syncope     hx of    Past Surgical History  Procedure Date  . Hernia repair   . Tonsillectomy   . Eye surgery     cataract surgery bilateral  . Small intestine surgery     hx of  . Prostate surgery     s/p ca    No Known Allergies  History  Substance Use Topics  . Smoking status: Never Smoker   . Smokeless tobacco: Not on file  . Alcohol Use: 1.2 oz/week    2 Shots of liquor per week    No family history on file. Prior to Admission medications   Medication Sig Start Date End Date Taking? Authorizing Provider  atorvastatin (LIPITOR) 40 MG tablet Take 40 mg by mouth every morning.    Yes Historical Provider, MD  calcium-vitamin D (OSCAL WITH D) 500-200 MG-UNIT per tablet Take 1 tablet by mouth daily.   Yes Historical Provider, MD  carvedilol (COREG) 12.5 MG tablet Take 12.5 mg by mouth 2 (two) times daily with a meal.   Yes Historical Provider, MD  cholecalciferol (VITAMIN D) 1000 UNITS tablet Take 1,000 Units by mouth daily.   Yes Historical Provider, MD  cyanocobalamin 500 MCG tablet Take 500 mcg by  mouth daily.   Yes Historical Provider, MD  hydrochlorothiazide (HYDRODIURIL) 25 MG tablet Take 25 mg by mouth every morning.   Yes Historical Provider, MD  insulin aspart (NOVOLOG) 100 UNIT/ML injection Inject 20 Units into the skin 3 (three) times daily with meals.    Yes Historical Provider, MD  insulin glargine (LANTUS) 100 UNIT/ML injection Inject 40 Units into the skin every evening.    Yes Historical Provider, MD  irbesartan (AVAPRO) 300 MG tablet Take 300 mg by mouth every morning.   Yes Historical Provider, MD  labetalol (NORMODYNE) 200 MG tablet Take 200 mg by mouth every morning.    Yes Historical Provider, MD  aspirin 81 MG chewable tablet Chew 81 mg by mouth daily.    Historical Provider, MD  pioglitazone-metformin (ACTOPLUS MET) 15-850 MG per tablet Take 1 tablet by mouth 2 (two) times daily with a meal.    Historical Provider, MD     Review of Systems  Positive ROS: neg  All other systems have been reviewed and were otherwise negative with the exception of those mentioned in the HPI and as above.  Objective: Vital signs in last 24 hours: Temp:  [98 F (36.7 C)] 98 F (36.7 C) (07/11  3016) Pulse Rate:  [67] 67  (07/11 0623) Resp:  [18] 18  (07/11 0623) BP: (158)/(76) 158/76 mmHg (07/11 0623) SpO2:  [97 %] 97 % (07/11 0623)  General Appearance: Alert, cooperative, no distress, appears stated age Head: Normocephalic, without obvious abnormality, atraumatic Eyes: PERRL, conjunctiva/corneas clear, EOM's intact, fundi benign, both eyes      Ears: Normal TM's and external ear canals, both ears Throat: Lips, mucosa, and tongue normal; teeth and gums normal Neck: Supple, symmetrical, trachea midline, no adenopathy; thyroid: No enlargement/tenderness/nodules; no carotid bruit or JVD Back: Symmetric, no curvature, ROM normal, no CVA tenderness Lungs: Clear to auscultation bilaterally, respirations unlabored Heart: Regular rate and rhythm, S1 and S2 normal, no murmur, rub or  gallop Abdomen: Soft, non-tender, bowel sounds active all four quadrants, no masses, no organomegaly Extremities: Extremities normal, atraumatic, no cyanosis or edema Pulses: 2+ and symmetric all extremities Skin: Skin color, texture, turgor normal, no rashes or lesions  NEUROLOGIC:  Mental status: Alert and oriented x4, no aphasia, good attention span, fund of knowledge and memory  Motor Exam - grossly normal with decreased grip Sensory Exam - grossly normal Reflexes: increased Coordination - decreased Gait - spastic Balance - grossly normal Cranial Nerves: I: smell Not tested  II: visual acuity  OS: nl    OD: nl  II: visual fields Full to confrontation  II: pupils Equal, round, reactive to light  III,VII: ptosis None  III,IV,VI: extraocular muscles  Full ROM  V: mastication Normal  V: facial light touch sensation  Normal  V,VII: corneal reflex  Present  VII: facial muscle function - upper  Normal  VII: facial muscle function - lower Normal  VIII: hearing Not tested  IX: soft palate elevation  Normal  IX,X: gag reflex Present  XI: trapezius strength  5/5  XI: sternocleidomastoid strength 5/5  XI: neck flexion strength  5/5  XII: tongue strength  Normal    Data Review Lab Results  Component Value Date   WBC 6.8 11/16/2011   HGB 11.8* 11/16/2011   HCT 35.0* 11/16/2011   MCV 92.3 11/16/2011   PLT 167 11/16/2011   Lab Results  Component Value Date   NA 139 11/16/2011   K 4.1 11/16/2011   CL 103 11/16/2011   CO2 25 11/16/2011   BUN 19 11/16/2011   CREATININE 1.17 11/16/2011   GLUCOSE 72 11/16/2011   Lab Results  Component Value Date   INR 1.07 11/18/2011    Assessment:   Cervical neck pain with herniated nucleus pulposus/ spondylosis/ stenosis at C7-T1. Patient has failed conservative therapy. Planned surgery : CL/PCK C7-T1  Plan:   I explained the condition and procedure to the patient and answered any questions.  Patient wishes to proceed with procedure as planned. Understands  risks/ benefits/ and expected or typical outcomes.  Myrtle Barnhard S 11/18/2011 7:29 AM

## 2011-11-18 NOTE — Progress Notes (Signed)
UR COMPLETED  

## 2011-11-19 ENCOUNTER — Encounter (HOSPITAL_COMMUNITY): Payer: Self-pay | Admitting: Neurological Surgery

## 2011-11-19 LAB — GLUCOSE, CAPILLARY
Glucose-Capillary: 94 mg/dL (ref 70–99)
Glucose-Capillary: 83 mg/dL (ref 70–99)

## 2011-11-19 MED ORDER — TAMSULOSIN HCL 0.4 MG PO CAPS
0.4000 mg | ORAL_CAPSULE | Freq: Every day | ORAL | Status: DC
  Administered 2011-11-19 – 2011-11-20 (×2): 0.4 mg via ORAL
  Filled 2011-11-19 (×2): qty 1

## 2011-11-19 MED ORDER — TAMSULOSIN HCL 0.4 MG PO CAPS: 0.4000 mg | ORAL_CAPSULE | Freq: Every day | ORAL | Status: DC

## 2011-11-19 NOTE — Evaluation (Signed)
Physical Therapy Evaluation Patient Details Name: [name] MRN: [id number] DOB: 03/04/1931 Today's Date: 11/19/2011 Time: 2952-8413 PT Time Calculation (min): 28 min  PT Assessment / Plan / Recommendation Clinical Impression  Pt s/p PCF C7-T1.Pt with previous balance issues prior to surgery. Pt will benefit from skilled PT in the acute care setting in order to maximize functional mobility and safety prior to d/c    PT Assessment  Patient needs continued PT services    Follow Up Recommendations  Outpatient PT;Supervision for mobility/OOB    Barriers to Discharge        Equipment Recommendations  Rolling walker with 5" wheels    Recommendations for Other Services     Frequency Min 4X/week    Precautions / Restrictions Precautions Precautions: Cervical Precaution Comments: pt educated on cervical precautions, handout given Restrictions Weight Bearing Restrictions: No         Mobility  Bed Mobility Bed Mobility: Rolling Left;Left Sidelying to Sit;Sitting - Scoot to Edge of Bed Rolling Left: 5: Supervision;With rail Left Sidelying to Sit: 5: Supervision;With rails;HOB flat Sitting - Scoot to Edge of Bed: 5: Supervision Details for Bed Mobility Assistance: VC for proper sequencing to maintain cervical precautions and safety during transfer. No physical assist needed Transfers Transfers: Sit to Stand;Stand to Sit Sit to Stand: 4: Min guard;With upper extremity assist;From bed;From toilet Stand to Sit: 4: Min guard;With upper extremity assist;To chair/3-in-1;To toilet Details for Transfer Assistance: VC for hand placement and proper sequencing for safety. Minguard for safety, pt able to compelte without physical assist Ambulation/Gait Ambulation/Gait Assistance: 5: Supervision;4: Min assist Ambulation Distance (Feet): 200 Feet Assistive device: 1 person hand held assist;Rolling walker Ambulation/Gait Assistance Details: Supervision only with RW, Min assist with HHA.  Discussed cane vs RW for safe d/c home, pt more comfortable with use of RW as well as more stable with change in gait speed and turns. Gait Pattern: Step-to pattern;Decreased hip/knee flexion - left;Decreased hip/knee flexion - right;Wide base of support;Trunk flexed Gait velocity: decreased gait speed    Exercises     PT Diagnosis: Difficulty walking;Acute pain  PT Problem List: Decreased activity tolerance;Decreased balance;Decreased mobility;Decreased knowledge of use of DME;Decreased safety awareness;Decreased knowledge of precautions;Pain PT Treatment Interventions: DME instruction;Gait training;Stair training;Functional mobility training;Therapeutic activities;Balance training;Neuromuscular re-education;Patient/family education   PT Goals Acute Rehab PT Goals PT Goal Formulation: With patient Time For Goal Achievement: 11/26/11 Potential to Achieve Goals: Good Pt will go Supine/Side to Sit: with modified independence PT Goal: Supine/Side to Sit - Progress: Goal set today Pt will go Sit to Supine/Side: with modified independence PT Goal: Sit to Supine/Side - Progress: Goal set today Pt will go Sit to Stand: with modified independence PT Goal: Sit to Stand - Progress: Goal set today Pt will go Stand to Sit: with modified independence PT Goal: Stand to Sit - Progress: Goal set today Pt will Transfer Bed to Chair/Chair to Bed: with modified independence PT Transfer Goal: Bed to Chair/Chair to Bed - Progress: Goal set today Pt will Ambulate: >150 feet;with modified independence;with least restrictive assistive device PT Goal: Ambulate - Progress: Goal set today Pt will Go Up / Down Stairs: 3-5 stairs;with supervision;with rail(s) PT Goal: Up/Down Stairs - Progress: Goal set today  Visit Information  Last PT Received On: 11/19/11 Assistance Needed: +1 PT/OT Co-Evaluation/Treatment: Yes    Subjective Data      Prior Functioning  Home Living Lives With: Daughter Available Help  at Discharge: Family;Available 24 hours/day Type of Home: House Home  Access: Stairs to enter [employer] of Steps: 3 Entrance Stairs-Rails: Can reach both Home Layout: One level Bathroom Shower/Tub: Forensic scientist: Standard Bathroom Accessibility: Yes How Accessible: Accessible via walker Home Adaptive Equipment: Straight cane Prior Function Level of Independence: Independent with assistive device(s) (cane) Able to Take Stairs?: Yes Driving: Yes Vocation: Retired Musician: No difficulties Dominant Hand: Right    Cognition  Overall Cognitive Status: Appears within functional limits for tasks assessed/performed Arousal/Alertness: Awake/alert Orientation Level: Appears intact for tasks assessed Behavior During Session: [facility] for tasks performed    Extremity/Trunk Assessment Right Lower Extremity Assessment RLE ROM/Strength/Tone: Within functional levels RLE Sensation: WFL - Light Touch Left Lower Extremity Assessment LLE ROM/Strength/Tone: Within functional levels LLE Sensation: WFL - Light Touch   Balance Balance Balance Assessed: Yes High Level Balance High Level Balance Activites: Sudden stops;Turns High Level Balance Comments: pt completed activities with hand held assist vs RW, pt safer and more efficient with RW  End of Session PT - End of Session Equipment Utilized During Treatment: Gait belt Activity Tolerance: Patient tolerated treatment well Patient left: in chair;with call bell/phone within reach Nurse Communication: Mobility status  GP     Milana Kidney 11/19/2011, 8:47 AM  11/19/2011 Milana Kidney DPT PAGER: [phone] OFFICE: [phone]

## 2011-11-19 NOTE — Plan of Care (Signed)
Problem: Consults Goal: Diagnosis - Spinal Surgery Cervical Spine Fusion     

## 2011-11-19 NOTE — Progress Notes (Signed)
Patient ID: [name], male   DOB: 1930-07-15, 78 y.o.   MRN: [id number] Patient is really doing well other than urinary retention. He had an out cath through the night. We'll go ahead and place a Foley catheter and start him on Flomax. He states his ambulation in his hands already feel better. Dressing is dry. He is angulating well this morning. His grips are equal. He looks comfortable. Hopefully home tomorrow if he can void.

## 2011-11-19 NOTE — Evaluation (Signed)
Occupational Therapy Evaluation and Discharge Patient Details Name: MATHESON VANDEHEI MRN: [id number] DOB: Sep 13, 1930 Today's Date: 11/19/2011 Time: 6962-9528 OT Time Calculation (min): 30 min  OT Assessment / Plan / Recommendation Clinical Impression  This 78 yo male s/p neck surgery presents to acute OT with all education completed. No further OT needs, will D/C from acute OT.    OT Assessment  Patient does not need any further OT services    Follow Up Recommendations  No OT follow up    Barriers to Discharge      Equipment Recommendations  None recommended by OT    Recommendations for Other Services    Frequency       Precautions / Restrictions Precautions Precautions: Cervical Precaution Comments: pt educated on cervical precautions, handout given Restrictions Weight Bearing Restrictions: No   Pertinent Vitals/Pain 5/10 neck    ADL  Eating/Feeding: Simulated;Independent Where Assessed - Eating/Feeding: Chair Grooming: Simulated;Set up Where Assessed - Grooming: Unsupported standing Upper Body Bathing: Simulated;Set up Where Assessed - Upper Body Bathing: Unsupported sit to stand Lower Body Bathing: Simulated;Minimal assistance (R and L feet) Where Assessed - Lower Body Bathing: Unsupported sit to stand Upper Body Dressing: Simulated;Set up Where Assessed - Upper Body Dressing: Unsupported sitting Lower Body Dressing: Simulated;Minimal assistance (Right sock--advised him to let daughter assist) Where Assessed - Lower Body Dressing: Unsupported sit to stand Toilet Transfer: Performed;Supervision/safety Toilet Transfer Method: Sit to Barista: Regular height toilet (counter top) Toileting - Clothing Manipulation and Hygiene: Simulated;Independent Where Assessed - Toileting Clothing Manipulation and Hygiene: Standing Equipment Used: Rolling walker Transfers/Ambulation Related to ADLs: Supervision ADL Comments: LUE is mildly weaker in grip than  RUE, however pt is right handed    OT Diagnosis:    OT Problem List:   OT Treatment Interventions:     OT Goals    Visit Information  Last OT Received On: 11/19/11 Assistance Needed: +1    Subjective Data  Subjective: My daughter will be with me at home   Prior Functioning  Vision/Perception  Home Living Lives With: Daughter Available Help at Discharge: Family;Available 24 hours/day Type of Home: House Home Access: Stairs to enter [employer] of Steps: 3 Entrance Stairs-Rails: Can reach both Home Layout: One level Bathroom Shower/Tub: Forensic scientist: Standard Bathroom Accessibility: Yes How Accessible: Accessible via walker Home Adaptive Equipment: Straight cane Additional Comments: Pt states that he thinks he might have a shower seat at home and a hand held shower head, if not he will get them at [facility] Prior Function Level of Independence: Independent with assistive device(s) (cane) Able to Take Stairs?: Yes Driving: Yes Vocation: Retired Musician: No difficulties Dominant Hand: Right      Cognition  Overall Cognitive Status: Appears within functional limits for tasks assessed/performed Arousal/Alertness: Awake/alert Orientation Level: Appears intact for tasks assessed Behavior During Session: [facility] for tasks performed    Extremity/Trunk Assessment Right Upper Extremity Assessment RUE ROM/Strength/Tone: Within functional levels (advised him not to raise arm over shoulder height) RUE Sensation: History of peripheral neuropathy;Deficits RUE Coordination: WFL - gross motor;WFL - fine motor Left Upper Extremity Assessment LUE ROM/Strength/Tone: Within functional levels (advised him not to raise arm over shoulder height) LUE Sensation: History of peripheral neuropathy;Deficits LUE Sensation Deficits: Says it feels differently from wrist distally LUE Coordination: WFL - gross motor;WFL - fine motor Right Lower  Extremity Assessment RLE ROM/Strength/Tone: Within functional levels RLE Sensation: WFL - Light Touch Left Lower Extremity Assessment LLE ROM/Strength/Tone: Within functional  levels LLE Sensation: WFL - Light Touch   Mobility Bed Mobility Bed Mobility: Rolling Left;Left Sidelying to Sit;Sitting - Scoot to Edge of Bed Rolling Left: 5: Supervision;With rail Left Sidelying to Sit: 5: Supervision;With rails;HOB flat Sitting - Scoot to Edge of Bed: 5: Supervision Details for Bed Mobility Assistance: VC for proper sequencing to maintain cervical precautions and safety during transfer. No physical assist needed Transfers Transfers: Sit to Stand;Stand to Sit Sit to Stand: 4: Min guard;With upper extremity assist;From bed;From toilet Stand to Sit: 4: Min guard;With upper extremity assist;To chair/3-in-1;To toilet Details for Transfer Assistance: VC for hand placement and proper sequencing for safety. [place]inguard for safety, pt able to compelte without physical assist   Exercise    Balance Balance Balance Assessed: Yes High Level Balance High Level Balance Activites: Sudden stops;Turns High Level Balance Comments: pt completed activities with hand held assist vs RW, pt safer and more efficient with RW  End of Session OT - End of Session Equipment Utilized During Treatment: Gait belt (RW) Activity Tolerance: Patient tolerated treatment well Patient left: in chair;with call bell/phone within reach Nurse Communication: Mobility status (staff saw [place] walking in hall with him)       Gevon, Markus [phone] 11/19/2011, 9:22 AM

## 2011-11-19 NOTE — Plan of Care (Signed)
Problem: Consults Goal: Diagnosis - Spinal Surgery Outcome: Completed/Met Date Met:  11/19/11 Cervical Spine Fusion

## 2011-11-20 LAB — GLUCOSE, CAPILLARY

## 2011-11-20 MED ORDER — CYCLOBENZAPRINE HCL 10 MG PO TABS: 10.0000 mg | ORAL_TABLET | Freq: Three times a day (TID) | ORAL | Status: AC | PRN

## 2011-11-20 MED ORDER — TAMSULOSIN HCL 0.4 MG PO CAPS: 0.4000 mg | ORAL_CAPSULE | Freq: Every day | ORAL | Status: DC

## 2011-11-20 MED ORDER — OXYCODONE-ACETAMINOPHEN 5-325 MG PO TABS: 1.0000 | ORAL_TABLET | ORAL | Status: AC | PRN

## 2011-11-20 NOTE — Discharge Summary (Signed)
  Physician Discharge Summary  Patient ID: [name] MRN: [id number] DOB/AGE: 1931/04/01 78 y.o.  Admit date: 11/18/2011 Discharge date: 11/20/2011  Admission Diagnoses: Cervical stenosis  Discharge Diagnoses: Same Active Problems:  * No active hospital problems. *    Discharged Condition: good  Hospital Course: Patient was admitted hospital underwent a posterior cervical decompression and fusion postoperatively patient did very well recovered in the floor home posterior day 1 centerwithurinaryretentionhadaFoleyplacedthiswastakenout5AMinthemorningofdischargeifthepatient'sabletovoidhe'llbedischargedhomeifheisunableavoidedhe'llhaveaFoleycatheterreplacedindischargewithFoleycarewasscheduledfollowupurology.  Consults: Significant Diagnostic Studies: Treatments: Posterior cervical decompression and fusion Discharge Exam: Blood pressure 147/70, pulse 87, temperature 97.8 F (36.6 C), temperature source Oral, resp. rate 18, height 5\' 9"  (1.753 m), weight 101.2 kg (223 lb 1.7 oz), SpO2 96.00%. Strength out of 5 wound clean and dry  Disposition: Home   Medication List  As of 11/20/2011  8:15 AM   TAKE these medications         aspirin 81 MG chewable tablet   Chew 81 mg by mouth daily.      atorvastatin 40 MG tablet   Commonly known as: LIPITOR   Take 40 mg by mouth every morning.      calcium-vitamin D 500-200 MG-UNIT per tablet   Commonly known as: OSCAL WITH D   Take 1 tablet by mouth daily.      carvedilol 12.5 MG tablet   Commonly known as: COREG   Take 12.5 mg by mouth 2 (two) times daily with a meal.      cholecalciferol 1000 UNITS tablet   Commonly known as: VITAMIN D   Take 1,000 Units by mouth daily.      cyanocobalamin 500 MCG tablet   Take 500 mcg by mouth daily.      cyclobenzaprine 10 MG tablet   Commonly known as: FLEXERIL   Take 1 tablet (10 mg total) by mouth 3 (three) times daily as needed for muscle spasms.      hydrochlorothiazide 25 MG tablet   Commonly known as: HYDRODIURIL   Take 25 mg by mouth every morning.      insulin aspart 100 UNIT/ML injection   Commonly known as: novoLOG   Inject 20 Units into the skin 3 (three) times daily with meals.      insulin glargine 100 UNIT/ML injection   Commonly known as: LANTUS   Inject 40 Units into the skin every evening.      irbesartan 300 MG tablet   Commonly known as: AVAPRO   Take 300 mg by mouth every morning.      labetalol 200 MG tablet   Commonly known as: NORMODYNE   Take 200 mg by mouth every morning.      oxyCODONE-acetaminophen 5-325 MG per tablet   Commonly known as: PERCOCET   Take 1-2 tablets by mouth every 4 (four) hours as needed.      pioglitazone-metformin 15-850 MG per tablet   Commonly known as: ACTOPLUS MET   Take 1 tablet by mouth 2 (two) times daily with a meal.             Signed: Arel Tippen P 11/20/2011, 8:15 AM

## 2011-11-20 NOTE — Progress Notes (Signed)
Subjective: Patient reports Is feeling better his next a sore but his arms and hands feel better he had his Foley take 5 AM but he has not voided yet  Objective: Vital signs in last 24 hours: Temp:  [97.5 F (36.4 C)-99.2 F (37.3 C)] 97.8 F (36.6 C) (07/13 0806) Pulse Rate:  [68-87] 87  (07/13 0806) Resp:  [18] 18  (07/13 0806) BP: (107-147)/(50-74) 147/70 mmHg (07/13 0806) SpO2:  [87 %-97 %] 96 % (07/13 0806)  Intake/Output from previous day: 07/12 0701 - 07/13 0700 In: 840 [Blevins.O.:840] Out: 1935 [Urine:1800; Drains:135] Intake/Output this shift:    Patient is awake alert strength out of 5 wound is clean and dry it took out his drain  Lab Results:  Basename 11/18/11 0636  WBC 6.0  HGB 11.5*  HCT 33.3*  PLT 164   BMET  Basename 11/18/11 0636  NA 141  K 3.7  CL 104  CO2 24  GLUCOSE 83  BUN 22  CREATININE 1.21  CALCIUM 9.3    Studies/Results: Dg Cervical Spine 2-3 Views  11/18/2011  *RADIOLOGY REPORT*  Clinical Data: Neck pain  DG C-ARM 1-60 MIN,CERVICAL SPINE - 2-3 VIEW  Comparison: Multiple priors  Findings: C-arm films document apparent C7-T1 laminectomy with lateral mass fusion.  IMPRESSION: As above.  Original Report Authenticated By: Elsie Stain, M.D.   Dg C-arm 1-60 Min  11/18/2011  *RADIOLOGY REPORT*  Clinical Data: Neck pain  DG C-ARM 1-60 MIN,CERVICAL SPINE - 2-3 VIEW  Comparison: Multiple priors  Findings: C-arm films document apparent C7-T1 laminectomy with lateral mass fusion.  IMPRESSION: As above.  Original Report Authenticated By: Elsie Stain, M.D.    Assessment/Plan: Posterior day 2 from posterior cervical decompression fusion doing very well discharged home today  LOS: 2 days     [name] 11/20/2011, 8:12 AM

## 2011-11-23 DIAGNOSIS — M6281 Muscle weakness (generalized): Secondary | ICD-10-CM | POA: Diagnosis not present

## 2011-11-23 DIAGNOSIS — E119 Type 2 diabetes mellitus without complications: Secondary | ICD-10-CM | POA: Diagnosis not present

## 2011-11-23 DIAGNOSIS — Z4789 Encounter for other orthopedic aftercare: Secondary | ICD-10-CM | POA: Diagnosis not present

## 2011-11-26 DIAGNOSIS — M6281 Muscle weakness (generalized): Secondary | ICD-10-CM | POA: Diagnosis not present

## 2011-11-26 DIAGNOSIS — E119 Type 2 diabetes mellitus without complications: Secondary | ICD-10-CM | POA: Diagnosis not present

## 2011-11-26 DIAGNOSIS — Z4789 Encounter for other orthopedic aftercare: Secondary | ICD-10-CM | POA: Diagnosis not present

## 2011-11-29 DIAGNOSIS — E119 Type 2 diabetes mellitus without complications: Secondary | ICD-10-CM | POA: Diagnosis not present

## 2011-11-29 DIAGNOSIS — Z4789 Encounter for other orthopedic aftercare: Secondary | ICD-10-CM | POA: Diagnosis not present

## 2011-11-29 DIAGNOSIS — M6281 Muscle weakness (generalized): Secondary | ICD-10-CM | POA: Diagnosis not present

## 2011-11-30 DIAGNOSIS — Z4789 Encounter for other orthopedic aftercare: Secondary | ICD-10-CM | POA: Diagnosis not present

## 2011-11-30 DIAGNOSIS — E119 Type 2 diabetes mellitus without complications: Secondary | ICD-10-CM | POA: Diagnosis not present

## 2011-11-30 DIAGNOSIS — M6281 Muscle weakness (generalized): Secondary | ICD-10-CM | POA: Diagnosis not present

## 2011-12-02 DIAGNOSIS — Z4789 Encounter for other orthopedic aftercare: Secondary | ICD-10-CM | POA: Diagnosis not present

## 2011-12-02 DIAGNOSIS — M6281 Muscle weakness (generalized): Secondary | ICD-10-CM | POA: Diagnosis not present

## 2011-12-02 DIAGNOSIS — E119 Type 2 diabetes mellitus without complications: Secondary | ICD-10-CM | POA: Diagnosis not present

## 2011-12-03 DIAGNOSIS — M6281 Muscle weakness (generalized): Secondary | ICD-10-CM | POA: Diagnosis not present

## 2011-12-03 DIAGNOSIS — Z4789 Encounter for other orthopedic aftercare: Secondary | ICD-10-CM | POA: Diagnosis not present

## 2011-12-03 DIAGNOSIS — E119 Type 2 diabetes mellitus without complications: Secondary | ICD-10-CM | POA: Diagnosis not present

## 2011-12-06 DIAGNOSIS — Z4789 Encounter for other orthopedic aftercare: Secondary | ICD-10-CM | POA: Diagnosis not present

## 2011-12-06 DIAGNOSIS — E119 Type 2 diabetes mellitus without complications: Secondary | ICD-10-CM | POA: Diagnosis not present

## 2011-12-06 DIAGNOSIS — M6281 Muscle weakness (generalized): Secondary | ICD-10-CM | POA: Diagnosis not present

## 2011-12-07 DIAGNOSIS — E119 Type 2 diabetes mellitus without complications: Secondary | ICD-10-CM | POA: Diagnosis not present

## 2011-12-07 DIAGNOSIS — M6281 Muscle weakness (generalized): Secondary | ICD-10-CM | POA: Diagnosis not present

## 2011-12-07 DIAGNOSIS — Z4789 Encounter for other orthopedic aftercare: Secondary | ICD-10-CM | POA: Diagnosis not present

## 2011-12-08 DIAGNOSIS — M6281 Muscle weakness (generalized): Secondary | ICD-10-CM | POA: Diagnosis not present

## 2011-12-08 DIAGNOSIS — Z4789 Encounter for other orthopedic aftercare: Secondary | ICD-10-CM | POA: Diagnosis not present

## 2011-12-08 DIAGNOSIS — E119 Type 2 diabetes mellitus without complications: Secondary | ICD-10-CM | POA: Diagnosis not present

## 2011-12-09 DIAGNOSIS — M6281 Muscle weakness (generalized): Secondary | ICD-10-CM | POA: Diagnosis not present

## 2011-12-09 DIAGNOSIS — E119 Type 2 diabetes mellitus without complications: Secondary | ICD-10-CM | POA: Diagnosis not present

## 2011-12-09 DIAGNOSIS — Z4789 Encounter for other orthopedic aftercare: Secondary | ICD-10-CM | POA: Diagnosis not present

## 2011-12-09 DIAGNOSIS — E161 Other hypoglycemia: Secondary | ICD-10-CM | POA: Diagnosis not present

## 2011-12-09 DIAGNOSIS — R7301 Impaired fasting glucose: Secondary | ICD-10-CM | POA: Diagnosis not present

## 2011-12-10 DIAGNOSIS — E119 Type 2 diabetes mellitus without complications: Secondary | ICD-10-CM | POA: Diagnosis not present

## 2011-12-10 DIAGNOSIS — Z4789 Encounter for other orthopedic aftercare: Secondary | ICD-10-CM | POA: Diagnosis not present

## 2011-12-10 DIAGNOSIS — M6281 Muscle weakness (generalized): Secondary | ICD-10-CM | POA: Diagnosis not present

## 2011-12-13 DIAGNOSIS — Z4789 Encounter for other orthopedic aftercare: Secondary | ICD-10-CM | POA: Diagnosis not present

## 2011-12-13 DIAGNOSIS — M6281 Muscle weakness (generalized): Secondary | ICD-10-CM | POA: Diagnosis not present

## 2011-12-13 DIAGNOSIS — E119 Type 2 diabetes mellitus without complications: Secondary | ICD-10-CM | POA: Diagnosis not present

## 2011-12-14 DIAGNOSIS — E119 Type 2 diabetes mellitus without complications: Secondary | ICD-10-CM | POA: Diagnosis not present

## 2011-12-14 DIAGNOSIS — Z4789 Encounter for other orthopedic aftercare: Secondary | ICD-10-CM | POA: Diagnosis not present

## 2011-12-14 DIAGNOSIS — M6281 Muscle weakness (generalized): Secondary | ICD-10-CM | POA: Diagnosis not present

## 2011-12-15 DIAGNOSIS — E119 Type 2 diabetes mellitus without complications: Secondary | ICD-10-CM | POA: Diagnosis not present

## 2011-12-15 DIAGNOSIS — M6281 Muscle weakness (generalized): Secondary | ICD-10-CM | POA: Diagnosis not present

## 2011-12-15 DIAGNOSIS — Z4789 Encounter for other orthopedic aftercare: Secondary | ICD-10-CM | POA: Diagnosis not present

## 2011-12-16 DIAGNOSIS — M6281 Muscle weakness (generalized): Secondary | ICD-10-CM | POA: Diagnosis not present

## 2011-12-16 DIAGNOSIS — Z4789 Encounter for other orthopedic aftercare: Secondary | ICD-10-CM | POA: Diagnosis not present

## 2011-12-16 DIAGNOSIS — E119 Type 2 diabetes mellitus without complications: Secondary | ICD-10-CM | POA: Diagnosis not present

## 2011-12-18 DIAGNOSIS — Z4789 Encounter for other orthopedic aftercare: Secondary | ICD-10-CM | POA: Diagnosis not present

## 2011-12-18 DIAGNOSIS — M6281 Muscle weakness (generalized): Secondary | ICD-10-CM | POA: Diagnosis not present

## 2011-12-18 DIAGNOSIS — E119 Type 2 diabetes mellitus without complications: Secondary | ICD-10-CM | POA: Diagnosis not present

## 2011-12-20 DIAGNOSIS — M6281 Muscle weakness (generalized): Secondary | ICD-10-CM | POA: Diagnosis not present

## 2011-12-20 DIAGNOSIS — Z4789 Encounter for other orthopedic aftercare: Secondary | ICD-10-CM | POA: Diagnosis not present

## 2011-12-20 DIAGNOSIS — E119 Type 2 diabetes mellitus without complications: Secondary | ICD-10-CM | POA: Diagnosis not present

## 2011-12-21 DIAGNOSIS — M6281 Muscle weakness (generalized): Secondary | ICD-10-CM | POA: Diagnosis not present

## 2011-12-21 DIAGNOSIS — Z4789 Encounter for other orthopedic aftercare: Secondary | ICD-10-CM | POA: Diagnosis not present

## 2011-12-21 DIAGNOSIS — E119 Type 2 diabetes mellitus without complications: Secondary | ICD-10-CM | POA: Diagnosis not present

## 2011-12-22 DIAGNOSIS — M6281 Muscle weakness (generalized): Secondary | ICD-10-CM | POA: Diagnosis not present

## 2011-12-22 DIAGNOSIS — E119 Type 2 diabetes mellitus without complications: Secondary | ICD-10-CM | POA: Diagnosis not present

## 2011-12-22 DIAGNOSIS — E1149 Type 2 diabetes mellitus with other diabetic neurological complication: Secondary | ICD-10-CM | POA: Diagnosis not present

## 2011-12-22 DIAGNOSIS — D649 Anemia, unspecified: Secondary | ICD-10-CM | POA: Diagnosis not present

## 2011-12-22 DIAGNOSIS — Z4789 Encounter for other orthopedic aftercare: Secondary | ICD-10-CM | POA: Diagnosis not present

## 2011-12-24 DIAGNOSIS — Z4789 Encounter for other orthopedic aftercare: Secondary | ICD-10-CM | POA: Diagnosis not present

## 2011-12-24 DIAGNOSIS — D649 Anemia, unspecified: Secondary | ICD-10-CM | POA: Diagnosis not present

## 2011-12-24 DIAGNOSIS — M6281 Muscle weakness (generalized): Secondary | ICD-10-CM | POA: Diagnosis not present

## 2011-12-24 DIAGNOSIS — I951 Orthostatic hypotension: Secondary | ICD-10-CM | POA: Diagnosis not present

## 2011-12-24 DIAGNOSIS — E119 Type 2 diabetes mellitus without complications: Secondary | ICD-10-CM | POA: Diagnosis not present

## 2011-12-24 DIAGNOSIS — I10 Essential (primary) hypertension: Secondary | ICD-10-CM | POA: Diagnosis not present

## 2011-12-27 DIAGNOSIS — Z4789 Encounter for other orthopedic aftercare: Secondary | ICD-10-CM | POA: Diagnosis not present

## 2011-12-27 DIAGNOSIS — M6281 Muscle weakness (generalized): Secondary | ICD-10-CM | POA: Diagnosis not present

## 2011-12-27 DIAGNOSIS — E119 Type 2 diabetes mellitus without complications: Secondary | ICD-10-CM | POA: Diagnosis not present

## 2011-12-28 DIAGNOSIS — M4712 Other spondylosis with myelopathy, cervical region: Secondary | ICD-10-CM | POA: Diagnosis not present

## 2011-12-29 DIAGNOSIS — E119 Type 2 diabetes mellitus without complications: Secondary | ICD-10-CM | POA: Diagnosis not present

## 2011-12-29 DIAGNOSIS — Z4789 Encounter for other orthopedic aftercare: Secondary | ICD-10-CM | POA: Diagnosis not present

## 2011-12-29 DIAGNOSIS — M6281 Muscle weakness (generalized): Secondary | ICD-10-CM | POA: Diagnosis not present

## 2011-12-31 DIAGNOSIS — Z4789 Encounter for other orthopedic aftercare: Secondary | ICD-10-CM | POA: Diagnosis not present

## 2011-12-31 DIAGNOSIS — M6281 Muscle weakness (generalized): Secondary | ICD-10-CM | POA: Diagnosis not present

## 2011-12-31 DIAGNOSIS — E119 Type 2 diabetes mellitus without complications: Secondary | ICD-10-CM | POA: Diagnosis not present

## 2012-01-05 DIAGNOSIS — M6281 Muscle weakness (generalized): Secondary | ICD-10-CM | POA: Diagnosis not present

## 2012-01-05 DIAGNOSIS — Z4789 Encounter for other orthopedic aftercare: Secondary | ICD-10-CM | POA: Diagnosis not present

## 2012-01-05 DIAGNOSIS — E119 Type 2 diabetes mellitus without complications: Secondary | ICD-10-CM | POA: Diagnosis not present

## 2012-01-17 DIAGNOSIS — Z961 Presence of intraocular lens: Secondary | ICD-10-CM | POA: Diagnosis not present

## 2012-01-17 DIAGNOSIS — E119 Type 2 diabetes mellitus without complications: Secondary | ICD-10-CM | POA: Diagnosis not present

## 2012-01-21 DIAGNOSIS — E785 Hyperlipidemia, unspecified: Secondary | ICD-10-CM | POA: Diagnosis not present

## 2012-01-21 DIAGNOSIS — I10 Essential (primary) hypertension: Secondary | ICD-10-CM | POA: Diagnosis not present

## 2012-01-21 DIAGNOSIS — I509 Heart failure, unspecified: Secondary | ICD-10-CM | POA: Diagnosis not present

## 2012-01-21 DIAGNOSIS — E119 Type 2 diabetes mellitus without complications: Secondary | ICD-10-CM | POA: Diagnosis not present

## 2012-01-21 DIAGNOSIS — D649 Anemia, unspecified: Secondary | ICD-10-CM | POA: Diagnosis not present

## 2012-01-26 DIAGNOSIS — D649 Anemia, unspecified: Secondary | ICD-10-CM | POA: Diagnosis not present

## 2012-01-26 DIAGNOSIS — I509 Heart failure, unspecified: Secondary | ICD-10-CM | POA: Diagnosis not present

## 2012-01-28 DIAGNOSIS — E1149 Type 2 diabetes mellitus with other diabetic neurological complication: Secondary | ICD-10-CM | POA: Diagnosis not present

## 2012-01-28 DIAGNOSIS — L608 Other nail disorders: Secondary | ICD-10-CM | POA: Diagnosis not present

## 2012-02-01 DIAGNOSIS — M4712 Other spondylosis with myelopathy, cervical region: Secondary | ICD-10-CM | POA: Diagnosis not present

## 2012-02-08 DIAGNOSIS — D649 Anemia, unspecified: Secondary | ICD-10-CM | POA: Diagnosis not present

## 2012-02-10 DIAGNOSIS — Z23 Encounter for immunization: Secondary | ICD-10-CM | POA: Diagnosis not present

## 2012-02-10 DIAGNOSIS — I10 Essential (primary) hypertension: Secondary | ICD-10-CM | POA: Diagnosis not present

## 2012-02-10 DIAGNOSIS — D649 Anemia, unspecified: Secondary | ICD-10-CM | POA: Diagnosis not present

## 2012-02-10 DIAGNOSIS — I509 Heart failure, unspecified: Secondary | ICD-10-CM | POA: Diagnosis not present

## 2012-02-15 DIAGNOSIS — E559 Vitamin D deficiency, unspecified: Secondary | ICD-10-CM | POA: Diagnosis not present

## 2012-02-15 DIAGNOSIS — C61 Malignant neoplasm of prostate: Secondary | ICD-10-CM | POA: Diagnosis not present

## 2012-02-23 DIAGNOSIS — I447 Left bundle-branch block, unspecified: Secondary | ICD-10-CM | POA: Diagnosis not present

## 2012-02-23 DIAGNOSIS — I5031 Acute diastolic (congestive) heart failure: Secondary | ICD-10-CM | POA: Diagnosis not present

## 2012-02-23 DIAGNOSIS — E785 Hyperlipidemia, unspecified: Secondary | ICD-10-CM | POA: Diagnosis not present

## 2012-02-23 DIAGNOSIS — I10 Essential (primary) hypertension: Secondary | ICD-10-CM | POA: Diagnosis not present

## 2012-02-29 ENCOUNTER — Other Ambulatory Visit: Payer: Self-pay | Admitting: Endocrinology

## 2012-02-29 ENCOUNTER — Ambulatory Visit
Admission: RE | Admit: 2012-02-29 | Discharge: 2012-02-29 | Disposition: A | Discharge status: Home / Self Care | Payer: Medicare Other | Source: Ambulatory Visit | Attending: Endocrinology | Admitting: Endocrinology

## 2012-02-29 DIAGNOSIS — M545 Low back pain: Secondary | ICD-10-CM

## 2012-02-29 DIAGNOSIS — M47817 Spondylosis without myelopathy or radiculopathy, lumbosacral region: Secondary | ICD-10-CM | POA: Diagnosis not present

## 2012-03-06 ENCOUNTER — Other Ambulatory Visit: Payer: Self-pay | Admitting: Endocrinology

## 2012-03-06 ENCOUNTER — Ambulatory Visit
Admission: RE | Admit: 2012-03-06 | Discharge: 2012-03-06 | Disposition: A | Discharge status: Home / Self Care | Payer: Medicare Other | Source: Ambulatory Visit | Attending: Endocrinology | Admitting: Endocrinology

## 2012-03-06 DIAGNOSIS — R079 Chest pain, unspecified: Secondary | ICD-10-CM | POA: Diagnosis not present

## 2012-03-06 DIAGNOSIS — J90 Pleural effusion, not elsewhere classified: Secondary | ICD-10-CM | POA: Diagnosis not present

## 2012-03-06 DIAGNOSIS — M898X9 Other specified disorders of bone, unspecified site: Secondary | ICD-10-CM

## 2012-03-06 DIAGNOSIS — R0781 Pleurodynia: Secondary | ICD-10-CM

## 2012-03-07 ENCOUNTER — Other Ambulatory Visit: Payer: Self-pay | Admitting: Endocrinology

## 2012-03-07 DIAGNOSIS — M898X9 Other specified disorders of bone, unspecified site: Secondary | ICD-10-CM

## 2012-03-09 ENCOUNTER — Encounter (HOSPITAL_COMMUNITY)
Admission: RE | Admit: 2012-03-09 | Discharge: 2012-03-09 | Disposition: A | Discharge status: Home / Self Care | Payer: Medicare Other | Source: Ambulatory Visit | Attending: Endocrinology | Admitting: Endocrinology

## 2012-03-09 DIAGNOSIS — M898X9 Other specified disorders of bone, unspecified site: Secondary | ICD-10-CM

## 2012-03-09 DIAGNOSIS — C61 Malignant neoplasm of prostate: Secondary | ICD-10-CM | POA: Diagnosis not present

## 2012-03-09 DIAGNOSIS — M546 Pain in thoracic spine: Secondary | ICD-10-CM | POA: Insufficient documentation

## 2012-03-09 DIAGNOSIS — Z8546 Personal history of malignant neoplasm of prostate: Secondary | ICD-10-CM | POA: Diagnosis not present

## 2012-03-09 MED ORDER — TECHNETIUM TC 99M MEDRONATE IV KIT
25.0000 | PACK | Freq: Once | INTRAVENOUS | Status: AC | PRN
  Administered 2012-03-09: 25 via INTRAVENOUS

## 2012-03-10 DIAGNOSIS — I447 Left bundle-branch block, unspecified: Secondary | ICD-10-CM | POA: Diagnosis not present

## 2012-03-10 DIAGNOSIS — M545 Low back pain: Secondary | ICD-10-CM | POA: Diagnosis not present

## 2012-03-10 DIAGNOSIS — I5031 Acute diastolic (congestive) heart failure: Secondary | ICD-10-CM | POA: Diagnosis not present

## 2012-03-10 DIAGNOSIS — E785 Hyperlipidemia, unspecified: Secondary | ICD-10-CM | POA: Diagnosis not present

## 2012-03-10 DIAGNOSIS — I10 Essential (primary) hypertension: Secondary | ICD-10-CM | POA: Diagnosis not present

## 2012-03-24 DIAGNOSIS — D649 Anemia, unspecified: Secondary | ICD-10-CM | POA: Diagnosis not present

## 2012-03-27 DIAGNOSIS — E1149 Type 2 diabetes mellitus with other diabetic neurological complication: Secondary | ICD-10-CM | POA: Diagnosis not present

## 2012-03-27 DIAGNOSIS — I509 Heart failure, unspecified: Secondary | ICD-10-CM | POA: Diagnosis not present

## 2012-03-27 DIAGNOSIS — R809 Proteinuria, unspecified: Secondary | ICD-10-CM | POA: Diagnosis not present

## 2012-03-27 DIAGNOSIS — E1142 Type 2 diabetes mellitus with diabetic polyneuropathy: Secondary | ICD-10-CM | POA: Diagnosis not present

## 2012-03-27 DIAGNOSIS — D649 Anemia, unspecified: Secondary | ICD-10-CM | POA: Diagnosis not present

## 2012-03-27 DIAGNOSIS — E1165 Type 2 diabetes mellitus with hyperglycemia: Secondary | ICD-10-CM | POA: Diagnosis not present

## 2012-03-27 DIAGNOSIS — E1129 Type 2 diabetes mellitus with other diabetic kidney complication: Secondary | ICD-10-CM | POA: Diagnosis not present

## 2012-03-27 DIAGNOSIS — R079 Chest pain, unspecified: Secondary | ICD-10-CM | POA: Diagnosis not present

## 2012-03-27 DIAGNOSIS — I10 Essential (primary) hypertension: Secondary | ICD-10-CM | POA: Diagnosis not present

## 2012-03-29 DIAGNOSIS — I10 Essential (primary) hypertension: Secondary | ICD-10-CM | POA: Diagnosis not present

## 2012-03-29 DIAGNOSIS — E119 Type 2 diabetes mellitus without complications: Secondary | ICD-10-CM | POA: Diagnosis not present

## 2012-03-29 DIAGNOSIS — I5042 Chronic combined systolic (congestive) and diastolic (congestive) heart failure: Secondary | ICD-10-CM | POA: Diagnosis not present

## 2012-03-29 DIAGNOSIS — Z79899 Other long term (current) drug therapy: Secondary | ICD-10-CM | POA: Diagnosis not present

## 2012-03-29 DIAGNOSIS — I447 Left bundle-branch block, unspecified: Secondary | ICD-10-CM | POA: Diagnosis not present

## 2012-04-13 ENCOUNTER — Emergency Department (HOSPITAL_COMMUNITY)
Admission: EM | Admit: 2012-04-13 | Discharge: 2012-04-13 | Disposition: A | Discharge status: Home / Self Care | Payer: Medicare Other | Attending: Emergency Medicine | Admitting: Emergency Medicine

## 2012-04-13 ENCOUNTER — Other Ambulatory Visit: Payer: Self-pay

## 2012-04-13 ENCOUNTER — Encounter (HOSPITAL_COMMUNITY): Payer: Self-pay

## 2012-04-13 DIAGNOSIS — R350 Frequency of micturition: Secondary | ICD-10-CM | POA: Insufficient documentation

## 2012-04-13 DIAGNOSIS — I10 Essential (primary) hypertension: Secondary | ICD-10-CM | POA: Insufficient documentation

## 2012-04-13 DIAGNOSIS — I2510 Atherosclerotic heart disease of native coronary artery without angina pectoris: Secondary | ICD-10-CM | POA: Insufficient documentation

## 2012-04-13 DIAGNOSIS — R5381 Other malaise: Secondary | ICD-10-CM | POA: Insufficient documentation

## 2012-04-13 DIAGNOSIS — E119 Type 2 diabetes mellitus without complications: Secondary | ICD-10-CM | POA: Diagnosis not present

## 2012-04-13 DIAGNOSIS — D649 Anemia, unspecified: Secondary | ICD-10-CM | POA: Diagnosis not present

## 2012-04-13 DIAGNOSIS — Z794 Long term (current) use of insulin: Secondary | ICD-10-CM | POA: Diagnosis not present

## 2012-04-13 DIAGNOSIS — R5383 Other fatigue: Secondary | ICD-10-CM | POA: Diagnosis not present

## 2012-04-13 DIAGNOSIS — R51 Headache: Secondary | ICD-10-CM | POA: Diagnosis not present

## 2012-04-13 DIAGNOSIS — Z7982 Long term (current) use of aspirin: Secondary | ICD-10-CM | POA: Insufficient documentation

## 2012-04-13 DIAGNOSIS — G709 Myoneural disorder, unspecified: Secondary | ICD-10-CM | POA: Diagnosis not present

## 2012-04-13 DIAGNOSIS — E785 Hyperlipidemia, unspecified: Secondary | ICD-10-CM | POA: Diagnosis not present

## 2012-04-13 DIAGNOSIS — Z8669 Personal history of other diseases of the nervous system and sense organs: Secondary | ICD-10-CM | POA: Insufficient documentation

## 2012-04-13 DIAGNOSIS — I9589 Other hypotension: Secondary | ICD-10-CM | POA: Diagnosis not present

## 2012-04-13 DIAGNOSIS — Z8546 Personal history of malignant neoplasm of prostate: Secondary | ICD-10-CM | POA: Insufficient documentation

## 2012-04-13 DIAGNOSIS — Z79899 Other long term (current) drug therapy: Secondary | ICD-10-CM | POA: Diagnosis not present

## 2012-04-13 DIAGNOSIS — Z8701 Personal history of pneumonia (recurrent): Secondary | ICD-10-CM | POA: Insufficient documentation

## 2012-04-13 DIAGNOSIS — N529 Male erectile dysfunction, unspecified: Secondary | ICD-10-CM | POA: Diagnosis not present

## 2012-04-13 DIAGNOSIS — R197 Diarrhea, unspecified: Secondary | ICD-10-CM | POA: Diagnosis not present

## 2012-04-13 DIAGNOSIS — R531 Weakness: Secondary | ICD-10-CM

## 2012-04-13 DIAGNOSIS — IMO0001 Reserved for inherently not codable concepts without codable children: Secondary | ICD-10-CM | POA: Diagnosis not present

## 2012-04-13 LAB — CBC WITH DIFFERENTIAL/PLATELET
Basophils Absolute: 0 K/uL (ref 0.0–0.1)
Basophils Relative: 0 % (ref 0–1)
Eosinophils Absolute: 0.1 10*3/uL (ref 0.0–0.7)
Eosinophils Relative: 1 % (ref 0–5)
HCT: 36.2 % — ABNORMAL LOW (ref 39.0–52.0)
Hemoglobin: 12.2 g/dL — ABNORMAL LOW (ref 13.0–17.0)
Lymphocytes Relative: 15 % (ref 12–46)
Lymphs Abs: 0.9 10*3/uL (ref 0.7–4.0)
MCH: 30.9 pg (ref 26.0–34.0)
MCHC: 33.7 g/dL (ref 30.0–36.0)
MCV: 91.6 fL (ref 78.0–100.0)
Monocytes Absolute: 0.4 10*3/uL (ref 0.1–1.0)
Monocytes Relative: 7 % (ref 3–12)
Neutro Abs: 4.6 K/uL (ref 1.7–7.7)
Neutrophils Relative %: 77 % (ref 43–77)
Platelets: 182 K/uL (ref 150–400)
RBC: 3.95 MIL/uL — ABNORMAL LOW (ref 4.22–5.81)
RDW: 14.4 % (ref 11.5–15.5)
WBC: 6 K/uL (ref 4.0–10.5)

## 2012-04-13 LAB — URINALYSIS, ROUTINE W REFLEX MICROSCOPIC
Glucose, UA: NEGATIVE mg/dL
Hgb urine dipstick: NEGATIVE
Ketones, ur: 15 mg/dL — AB
Leukocytes, UA: NEGATIVE
Nitrite: NEGATIVE
Protein, ur: 30 mg/dL — AB
Specific Gravity, Urine: 1.023 (ref 1.005–1.030)
Urobilinogen, UA: 1 mg/dL (ref 0.0–1.0)
pH: 5.5 (ref 5.0–8.0)

## 2012-04-13 LAB — URINE MICROSCOPIC-ADD ON

## 2012-04-13 LAB — COMPREHENSIVE METABOLIC PANEL WITH GFR
ALT: 6 U/L (ref 0–53)
AST: 15 U/L (ref 0–37)
Albumin: 3.7 g/dL (ref 3.5–5.2)
Alkaline Phosphatase: 108 U/L (ref 39–117)
BUN: 17 mg/dL (ref 6–23)
CO2: 27 meq/L (ref 19–32)
Calcium: 9.8 mg/dL (ref 8.4–10.5)
Chloride: 99 meq/L (ref 96–112)
Creatinine, Ser: 1.25 mg/dL (ref 0.50–1.35)
GFR calc Af Amer: 61 mL/min — ABNORMAL LOW
GFR calc non Af Amer: 53 mL/min — ABNORMAL LOW
Glucose, Bld: 99 mg/dL (ref 70–99)
Potassium: 3.8 meq/L (ref 3.5–5.1)
Sodium: 139 meq/L (ref 135–145)
Total Bilirubin: 0.4 mg/dL (ref 0.3–1.2)
Total Protein: 7.1 g/dL (ref 6.0–8.3)

## 2012-04-13 LAB — TROPONIN I: Troponin I: <0.3 ng/mL (ref <0.30)

## 2012-04-13 MED ORDER — SODIUM CHLORIDE 0.9 % IV BOLUS (SEPSIS)
500.0000 mL | Freq: Once | INTRAVENOUS | Status: AC
  Administered 2012-04-13: 500 mL via INTRAVENOUS

## 2012-04-13 NOTE — ED Notes (Signed)
Pt given sandwich and sprite.  

## 2012-04-13 NOTE — ED Notes (Signed)
Pt presents with 3 week h/o hypotension and hypoglycemia.  Pt sent to PCP today and referred here.  Pt reports generalized weakness and dizziness, denies any pain at present, but reports L flank pain x 2-3 weeks that is now resolved, denies any dysuria, reports difficulty beginning to void.

## 2012-04-13 NOTE — ED Provider Notes (Signed)
MSE was initiated and I personally evaluated the patient and placed orders (if any) at  12:04 PM on April 13, 2012.  The patient appears stable so that the remainder of the MSE may be completed by another provider.  Generalized weakness, low BP at PCP office, seen earlier today.  No note in EPIC.  Denies CP.  Appropriate gross mentation.  Gavin Pound. Lavar Rosenzweig, MD 04/13/12 1204

## 2012-04-13 NOTE — ED Provider Notes (Addendum)
History     CSN: [id number]  Arrival date & time 04/13/12  1145   First MD Initiated Contact with Patient 04/13/12 [phone]      Chief Complaint  Patient presents with  . Weakness    (Consider location/radiation/quality/duration/timing/severity/associated sxs/prior treatment) Patient is a 78 y.o. male presenting with weakness. The history is provided by the patient (the pt states he was seen at the office today and had low bp and low sugar.  he was sent here for evaluation). No language interpreter was used.  Weakness Primary symptoms do not include headaches or seizures. The symptoms began less than 1 hour ago. The symptoms are unchanged. The neurological symptoms are multifocal. Context: nothing.  Additional symptoms include weakness. Additional symptoms do not include hallucinations. Medical issues do not include seizures. Workup history does not include MRI.    Past Medical History  Diagnosis Date  . Diabetes mellitus   . ED (erectile dysfunction)   . Hypertension   . Hyperlipidemia   . Pneumonia     hx of  . CHF (congestive heart failure)   . Urination frequency   . Cancer     hx of prostate ca  . Neuromuscular disorder     numbness in hand/cervical issues  . Cataracts, bilateral     hx of  . Syncope     hx of    Past Surgical History  Procedure Date  . Hernia repair   . Tonsillectomy   . Eye surgery     cataract surgery bilateral  . Small intestine surgery     hx of  . Prostate surgery     s/p ca  . Posterior cervical fusion/foraminotomy 11/18/2011    Procedure: POSTERIOR CERVICAL FUSION/FORAMINOTOMY LEVEL 1;  Surgeon: Tia Alert, MD;  Location: [facility];  Service: Neurosurgery;  Laterality: Bilateral;    History reviewed. No pertinent family history.  History  Substance Use Topics  . Smoking status: Never Smoker   . Smokeless tobacco: Not on file  . Alcohol Use: 1.2 oz/week    2 Shots of liquor per week      Review of Systems   Constitutional: Negative for fatigue.  HENT: Negative for congestion, sinus pressure and ear discharge.   Eyes: Negative for discharge.  Respiratory: Negative for cough.   Cardiovascular: Negative for chest pain.  Gastrointestinal: Negative for abdominal pain and diarrhea.  Genitourinary: Negative for frequency and hematuria.  Musculoskeletal: Negative for back pain.  Skin: Negative for rash.  Neurological: Positive for weakness. Negative for seizures and headaches.  Hematological: Negative.   Psychiatric/Behavioral: Negative for hallucinations.    Allergies  Review of patient's allergies indicates no known allergies.  Home Medications   Current Outpatient Rx  Name  Route  Sig  Dispense  Refill  . ASPIRIN 81 MG PO CHEW   Oral   Chew 81 mg by mouth daily.         Marland Kitchen CALCIUM CARBONATE-VITAMIN D 500-200 MG-UNIT PO TABS   Oral   Take 1 tablet by mouth daily.         Marland Kitchen CARVEDILOL 12.5 MG PO TABS   Oral   Take 12.5 mg by mouth 2 (two) times daily with a meal.         . VITAMIN D 1000 UNITS PO TABS   Oral   Take 1,000 Units by mouth daily.         . CYANOCOBALAMIN 500 MCG PO TABS   Oral   Take  500 mcg by mouth daily.         . INSULIN ASPART 100 UNIT/ML [place] SOLN   Subcutaneous   Inject 3 Units into the skin 3 (three) times daily with meals.          . INSULIN GLARGINE 100 UNIT/ML [place] SOLN   Subcutaneous   Inject 30 Units into the skin every evening.            BP 148/69  Pulse 71  Temp 97.9 F (36.6 C) (Oral)  Resp 17  SpO2 99%  Physical Exam  Constitutional: He is oriented to person, place, and time. He appears well-developed.  HENT:  Head: Normocephalic and atraumatic.  Eyes: Conjunctivae normal and EOM are normal. No scleral icterus.  Neck: Neck supple. No thyromegaly present.  Cardiovascular: Normal rate and regular rhythm.  Exam reveals no gallop and no friction rub.   No murmur heard. Pulmonary/Chest: No stridor. He has no wheezes. He has  no rales. He exhibits no tenderness.  Abdominal: He exhibits no distension. There is no tenderness. There is no rebound.  Musculoskeletal: Normal range of motion. He exhibits no edema.  Lymphadenopathy:    He has no cervical adenopathy.  Neurological: He is oriented to person, place, and time. Coordination normal.  Skin: No rash noted. No erythema.  Psychiatric: He has a normal mood and affect. His behavior is normal.    ED Course  Procedures (including critical care time)  Labs Reviewed  CBC WITH DIFFERENTIAL - Abnormal; Notable for the following:    RBC 3.95 (*)     Hemoglobin 12.2 (*)     HCT 36.2 (*)     All other components within normal limits  URINALYSIS, ROUTINE W REFLEX MICROSCOPIC - Abnormal; Notable for the following:    Bilirubin Urine SMALL (*)     Ketones, ur 15 (*)     Protein, ur 30 (*)     All other components within normal limits  COMPREHENSIVE METABOLIC PANEL - Abnormal; Notable for the following:    GFR calc non Af Amer 53 (*)     GFR calc Af Amer 61 (*)     All other components within normal limits  URINE MICROSCOPIC-ADD ON - Abnormal; Notable for the following:    Casts HYALINE CASTS (*)     All other components within normal limits  TROPONIN I   No results found.   1. Weakness    Pt improved with tx   MDM          Benny Lennert, MD 04/13/12 1646  Benny Lennert, MD 05/02/12 [phone]

## 2012-04-17 DIAGNOSIS — E119 Type 2 diabetes mellitus without complications: Secondary | ICD-10-CM | POA: Diagnosis not present

## 2012-04-17 DIAGNOSIS — D649 Anemia, unspecified: Secondary | ICD-10-CM | POA: Diagnosis not present

## 2012-04-17 DIAGNOSIS — I10 Essential (primary) hypertension: Secondary | ICD-10-CM | POA: Diagnosis not present

## 2012-04-18 DIAGNOSIS — E1149 Type 2 diabetes mellitus with other diabetic neurological complication: Secondary | ICD-10-CM | POA: Diagnosis not present

## 2012-04-18 DIAGNOSIS — L608 Other nail disorders: Secondary | ICD-10-CM | POA: Diagnosis not present

## 2012-05-22 DIAGNOSIS — M4712 Other spondylosis with myelopathy, cervical region: Secondary | ICD-10-CM | POA: Diagnosis not present

## 2012-05-29 DIAGNOSIS — D649 Anemia, unspecified: Secondary | ICD-10-CM | POA: Diagnosis not present

## 2012-05-31 DIAGNOSIS — E119 Type 2 diabetes mellitus without complications: Secondary | ICD-10-CM | POA: Diagnosis not present

## 2012-05-31 DIAGNOSIS — K5909 Other constipation: Secondary | ICD-10-CM | POA: Diagnosis not present

## 2012-05-31 DIAGNOSIS — E785 Hyperlipidemia, unspecified: Secondary | ICD-10-CM | POA: Diagnosis not present

## 2012-05-31 DIAGNOSIS — M2550 Pain in unspecified joint: Secondary | ICD-10-CM | POA: Diagnosis not present

## 2012-05-31 DIAGNOSIS — I10 Essential (primary) hypertension: Secondary | ICD-10-CM | POA: Diagnosis not present

## 2012-05-31 DIAGNOSIS — D649 Anemia, unspecified: Secondary | ICD-10-CM | POA: Diagnosis not present

## 2012-06-28 DIAGNOSIS — D649 Anemia, unspecified: Secondary | ICD-10-CM | POA: Diagnosis not present

## 2012-06-28 DIAGNOSIS — E119 Type 2 diabetes mellitus without complications: Secondary | ICD-10-CM | POA: Diagnosis not present

## 2012-06-28 DIAGNOSIS — E785 Hyperlipidemia, unspecified: Secondary | ICD-10-CM | POA: Diagnosis not present

## 2012-06-30 DIAGNOSIS — E1142 Type 2 diabetes mellitus with diabetic polyneuropathy: Secondary | ICD-10-CM | POA: Diagnosis not present

## 2012-06-30 DIAGNOSIS — E1149 Type 2 diabetes mellitus with other diabetic neurological complication: Secondary | ICD-10-CM | POA: Diagnosis not present

## 2012-06-30 DIAGNOSIS — G5600 Carpal tunnel syndrome, unspecified upper limb: Secondary | ICD-10-CM | POA: Diagnosis not present

## 2012-06-30 DIAGNOSIS — I10 Essential (primary) hypertension: Secondary | ICD-10-CM | POA: Diagnosis not present

## 2012-06-30 DIAGNOSIS — D649 Anemia, unspecified: Secondary | ICD-10-CM | POA: Diagnosis not present

## 2012-07-11 DIAGNOSIS — E1149 Type 2 diabetes mellitus with other diabetic neurological complication: Secondary | ICD-10-CM | POA: Diagnosis not present

## 2012-07-11 DIAGNOSIS — L608 Other nail disorders: Secondary | ICD-10-CM | POA: Diagnosis not present

## 2012-07-26 DIAGNOSIS — I10 Essential (primary) hypertension: Secondary | ICD-10-CM | POA: Diagnosis not present

## 2012-07-26 DIAGNOSIS — I447 Left bundle-branch block, unspecified: Secondary | ICD-10-CM | POA: Diagnosis not present

## 2012-07-26 DIAGNOSIS — E119 Type 2 diabetes mellitus without complications: Secondary | ICD-10-CM | POA: Diagnosis not present

## 2012-07-26 DIAGNOSIS — I5042 Chronic combined systolic (congestive) and diastolic (congestive) heart failure: Secondary | ICD-10-CM | POA: Diagnosis not present

## 2012-09-06 DIAGNOSIS — E119 Type 2 diabetes mellitus without complications: Secondary | ICD-10-CM | POA: Diagnosis not present

## 2012-09-08 DIAGNOSIS — R809 Proteinuria, unspecified: Secondary | ICD-10-CM | POA: Diagnosis not present

## 2012-09-08 DIAGNOSIS — G5600 Carpal tunnel syndrome, unspecified upper limb: Secondary | ICD-10-CM | POA: Diagnosis not present

## 2012-09-08 DIAGNOSIS — E1129 Type 2 diabetes mellitus with other diabetic kidney complication: Secondary | ICD-10-CM | POA: Diagnosis not present

## 2012-09-08 DIAGNOSIS — K5900 Constipation, unspecified: Secondary | ICD-10-CM | POA: Diagnosis not present

## 2012-09-08 DIAGNOSIS — I10 Essential (primary) hypertension: Secondary | ICD-10-CM | POA: Diagnosis not present

## 2012-09-13 DIAGNOSIS — E1149 Type 2 diabetes mellitus with other diabetic neurological complication: Secondary | ICD-10-CM | POA: Diagnosis not present

## 2012-09-13 DIAGNOSIS — E1142 Type 2 diabetes mellitus with diabetic polyneuropathy: Secondary | ICD-10-CM | POA: Diagnosis not present

## 2012-09-13 DIAGNOSIS — G5600 Carpal tunnel syndrome, unspecified upper limb: Secondary | ICD-10-CM | POA: Diagnosis not present

## 2012-10-03 DIAGNOSIS — E1149 Type 2 diabetes mellitus with other diabetic neurological complication: Secondary | ICD-10-CM | POA: Diagnosis not present

## 2012-10-03 DIAGNOSIS — L608 Other nail disorders: Secondary | ICD-10-CM | POA: Diagnosis not present

## 2012-10-20 DIAGNOSIS — I10 Essential (primary) hypertension: Secondary | ICD-10-CM | POA: Diagnosis not present

## 2012-10-20 DIAGNOSIS — E1129 Type 2 diabetes mellitus with other diabetic kidney complication: Secondary | ICD-10-CM | POA: Diagnosis not present

## 2012-10-20 DIAGNOSIS — G5600 Carpal tunnel syndrome, unspecified upper limb: Secondary | ICD-10-CM | POA: Diagnosis not present

## 2012-10-20 DIAGNOSIS — E1149 Type 2 diabetes mellitus with other diabetic neurological complication: Secondary | ICD-10-CM | POA: Diagnosis not present

## 2012-10-20 DIAGNOSIS — I509 Heart failure, unspecified: Secondary | ICD-10-CM | POA: Diagnosis not present

## 2012-10-20 DIAGNOSIS — E119 Type 2 diabetes mellitus without complications: Secondary | ICD-10-CM | POA: Diagnosis not present

## 2012-10-20 DIAGNOSIS — D649 Anemia, unspecified: Secondary | ICD-10-CM | POA: Diagnosis not present

## 2012-11-22 DIAGNOSIS — I10 Essential (primary) hypertension: Secondary | ICD-10-CM | POA: Diagnosis not present

## 2012-11-22 DIAGNOSIS — I447 Left bundle-branch block, unspecified: Secondary | ICD-10-CM | POA: Diagnosis not present

## 2012-11-22 DIAGNOSIS — I5042 Chronic combined systolic (congestive) and diastolic (congestive) heart failure: Secondary | ICD-10-CM | POA: Diagnosis not present

## 2012-11-30 IMAGING — CR DG LUMBAR SPINE 2-3V
3 series · 3 of 3 positions shown · non-contrast
Comparison: MRI of 11/06/2011.

CLINICAL DATA: Left-sided low back pain.  Lumbar surgery
11/18/2011.

LUMBAR SPINE - 2-3 VIEW

[t l-spine a.p.]
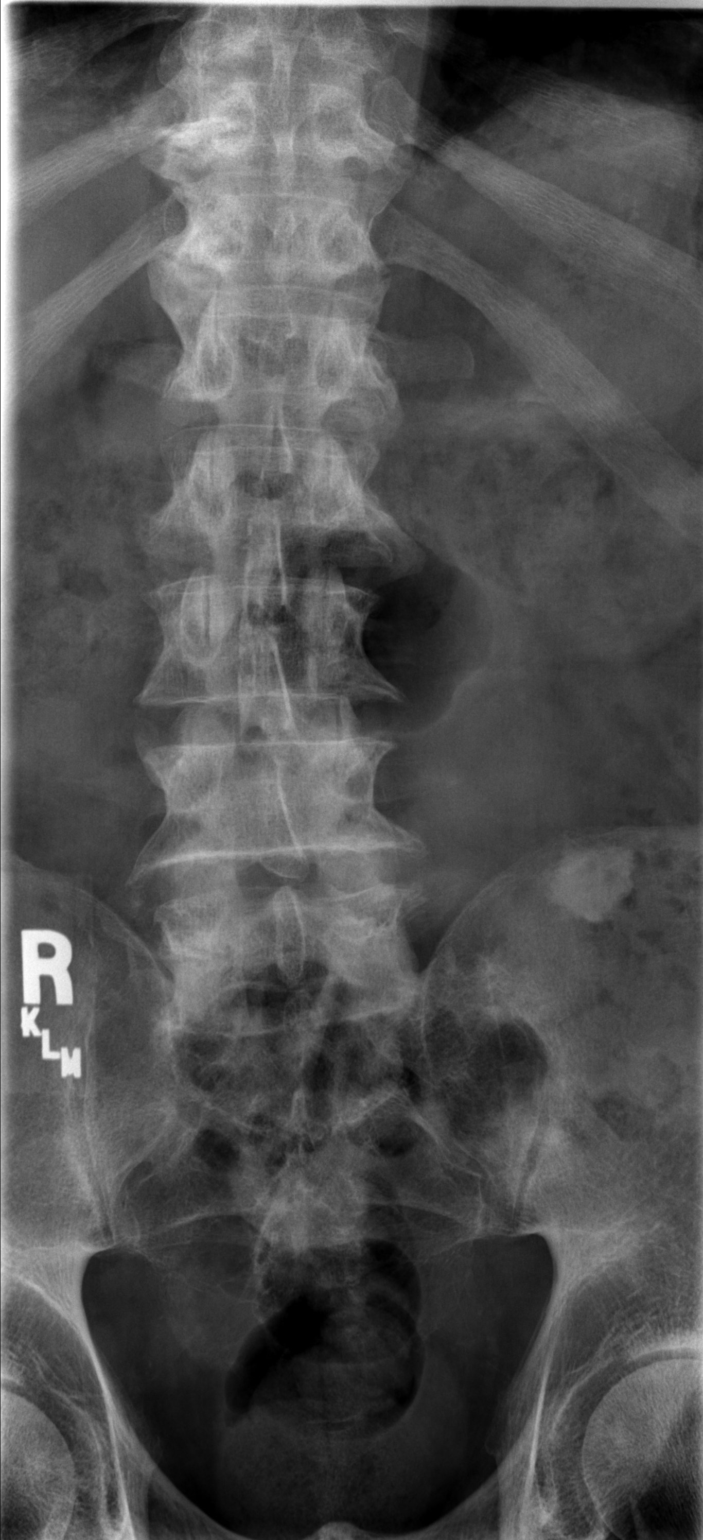

[t l-spine lat]
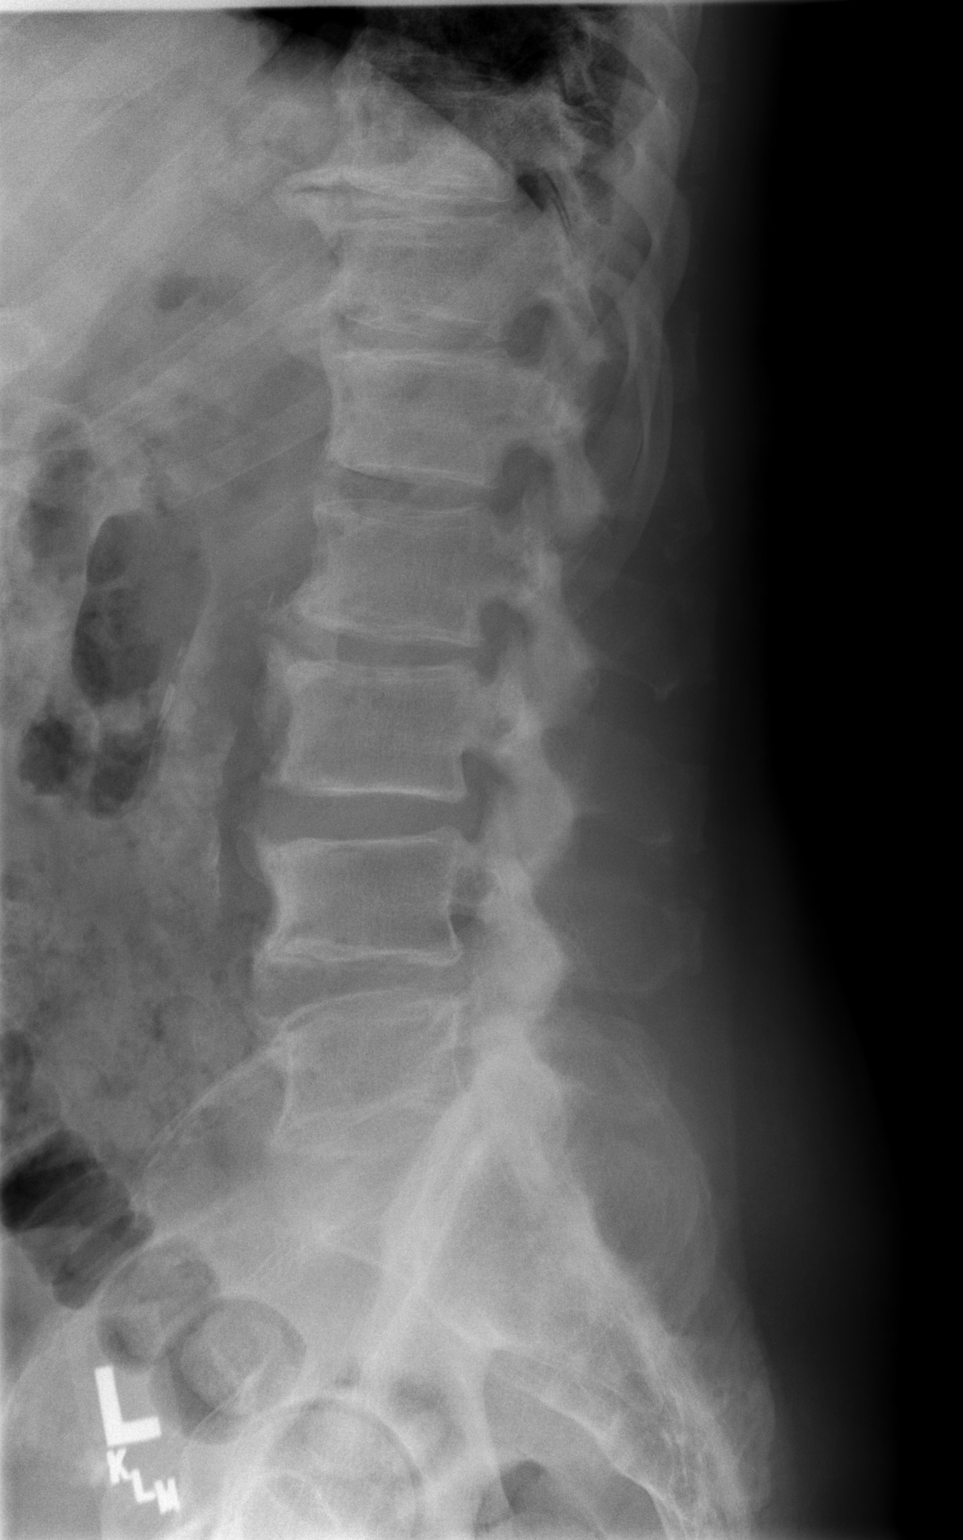

[t l-spine l5-s1 spot]
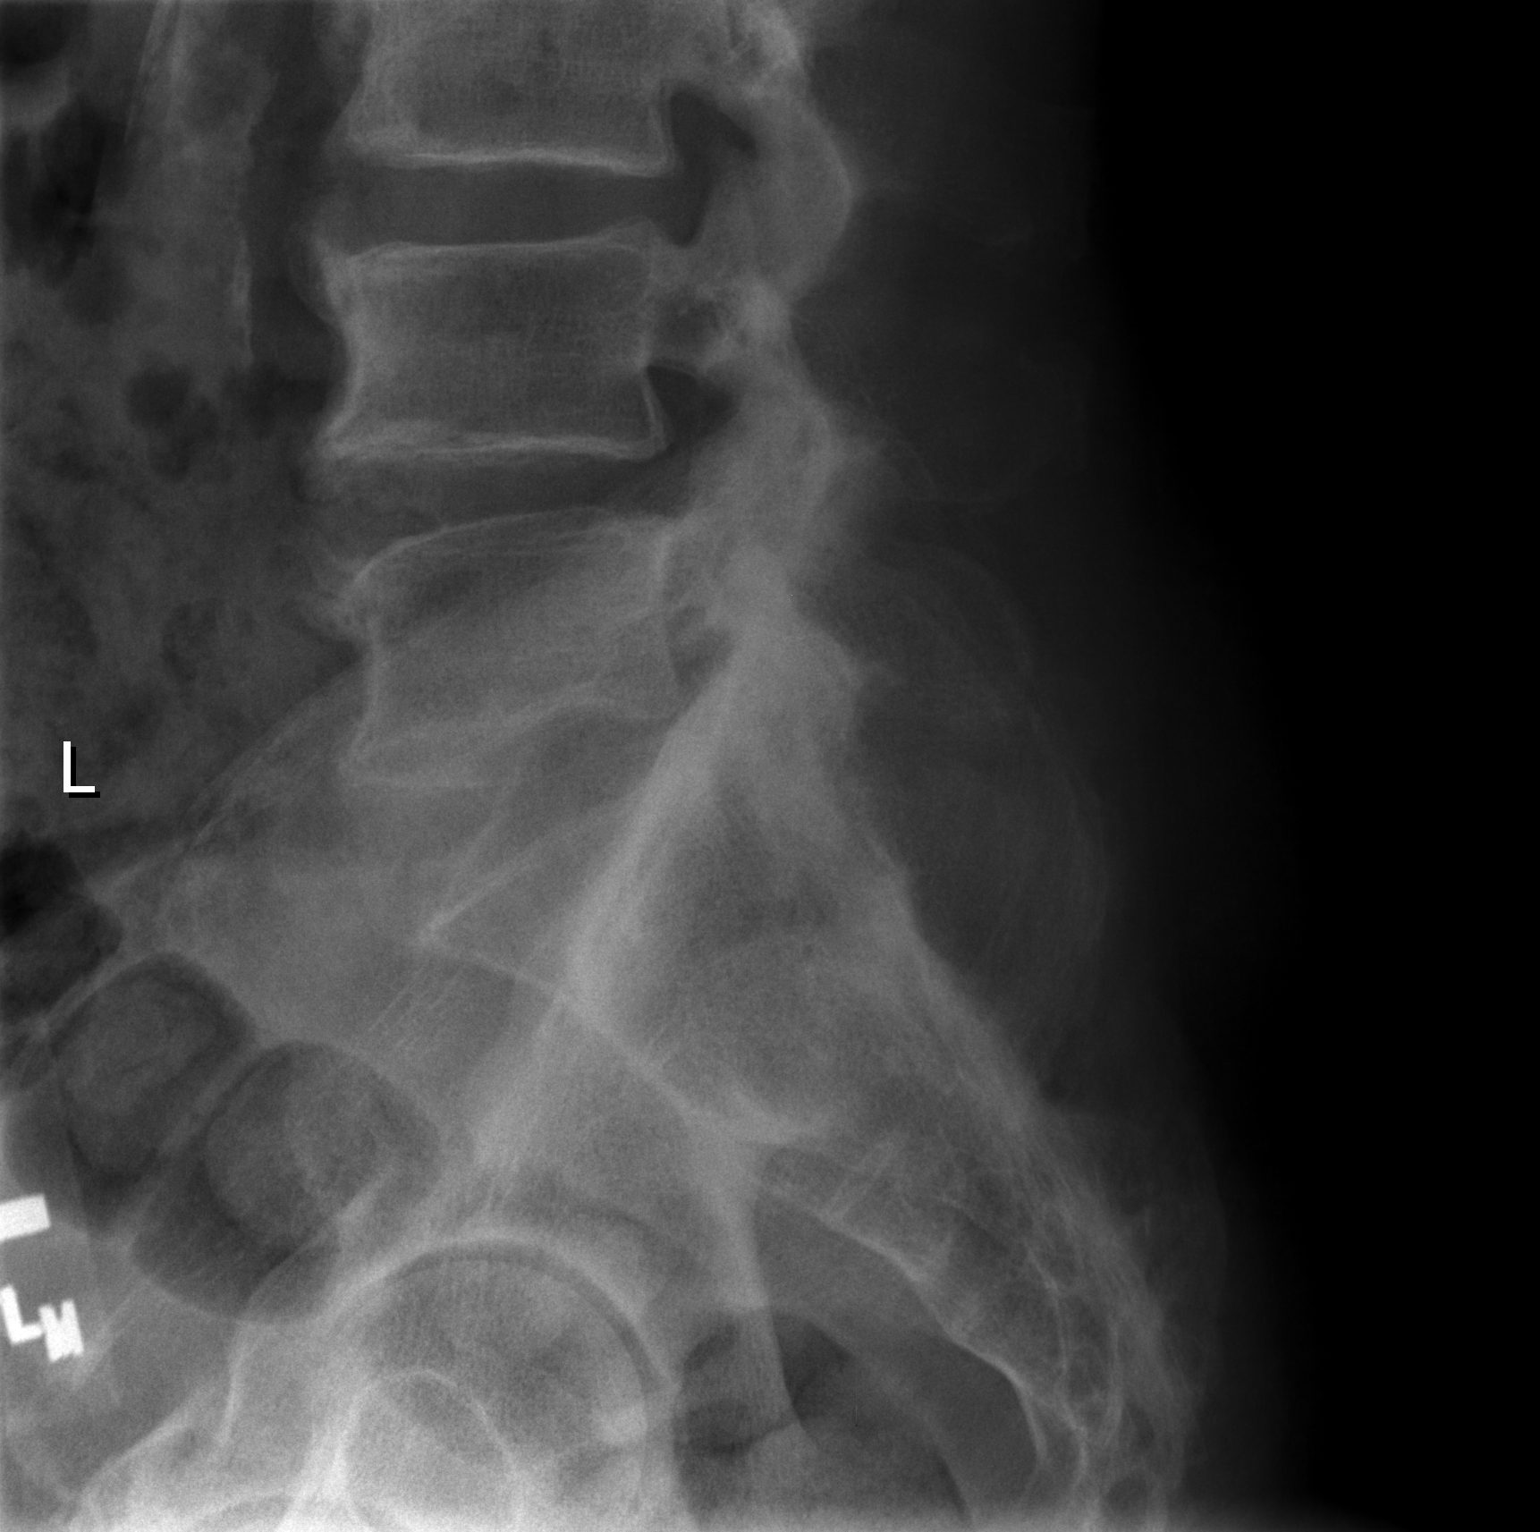

[3 of 3 positions shown; findings below may reference images not displayed]

FINDINGS: Five lumbar type vertebral bodies.  A sclerotic lesion
within the left iliac bone measures 2.3 cm today versus similar on
10/15/2011. Sacroiliac joints are symmetric.  Maintenance of
vertebral body height and alignment.  Prominent end plate
osteophytes at T11-T12.  Ordinary for age spondylosis throughout
the lumbar spine.  Aortic atherosclerosis. Nonspecific
straightening of expected lordosis.
IMPRESSION: 1.  Thoracolumbar spondylosis, without acute finding.
2.  A left iliac wing sclerotic lesion is similar back to
04/02/2005, suggesting a benign bone island.

## 2012-12-14 ENCOUNTER — Other Ambulatory Visit: Payer: Self-pay | Admitting: *Deleted

## 2012-12-14 MED ORDER — GLUCOSE BLOOD VI STRP: ORAL_STRIP | Status: DC

## 2012-12-15 ENCOUNTER — Other Ambulatory Visit: Payer: Self-pay | Admitting: *Deleted

## 2012-12-29 DIAGNOSIS — L608 Other nail disorders: Secondary | ICD-10-CM | POA: Diagnosis not present

## 2012-12-29 DIAGNOSIS — E1149 Type 2 diabetes mellitus with other diabetic neurological complication: Secondary | ICD-10-CM | POA: Diagnosis not present

## 2013-01-15 ENCOUNTER — Other Ambulatory Visit: Payer: Self-pay | Admitting: *Deleted

## 2013-01-15 ENCOUNTER — Encounter: Payer: Self-pay | Admitting: Endocrinology

## 2013-01-15 ENCOUNTER — Ambulatory Visit (INDEPENDENT_AMBULATORY_CARE_PROVIDER_SITE_OTHER): Payer: Medicare Other | Admitting: Endocrinology

## 2013-01-15 VITALS — BP 120/74 | HR 60 | Temp 97.9°F | Resp 12 | Ht 69.0 in | Wt 215.7 lb

## 2013-01-15 DIAGNOSIS — E1149 Type 2 diabetes mellitus with other diabetic neurological complication: Secondary | ICD-10-CM | POA: Insufficient documentation

## 2013-01-15 DIAGNOSIS — N183 Chronic kidney disease, stage 3 unspecified: Secondary | ICD-10-CM | POA: Diagnosis not present

## 2013-01-15 DIAGNOSIS — E119 Type 2 diabetes mellitus without complications: Secondary | ICD-10-CM | POA: Diagnosis not present

## 2013-01-15 DIAGNOSIS — I10 Essential (primary) hypertension: Secondary | ICD-10-CM | POA: Diagnosis not present

## 2013-01-15 LAB — COMPREHENSIVE METABOLIC PANEL
Albumin: 3.7 g/dL (ref 3.5–5.2)
Alkaline Phosphatase: 107 U/L (ref 39–117)
BUN: 18 mg/dL (ref 6–23)
Glucose, Bld: 90 mg/dL (ref 70–99)
Potassium: 4.1 mEq/L (ref 3.5–5.1)
Total Bilirubin: 0.5 mg/dL (ref 0.3–1.2)

## 2013-01-15 LAB — HEMOGLOBIN A1C: Hgb A1c MFr Bld: 8.8 % — ABNORMAL HIGH (ref 4.6–6.5)

## 2013-01-15 LAB — MICROALBUMIN / CREATININE URINE RATIO: Creatinine,U: 62.5 mg/dL

## 2013-01-15 LAB — CBC WITH DIFFERENTIAL/PLATELET
Basophils Relative: 0.5 % (ref 0.0–3.0)
Eosinophils Relative: 2.4 % (ref 0.0–5.0)
HCT: 38.9 % — ABNORMAL LOW (ref 39.0–52.0)
Hemoglobin: 13 g/dL (ref 13.0–17.0)
Lymphs Abs: 1.5 10*3/uL (ref 0.7–4.0)
MCV: 93.8 fl (ref 78.0–100.0)
Monocytes Absolute: 0.4 10*3/uL (ref 0.1–1.0)
RBC: 4.15 Mil/uL — ABNORMAL LOW (ref 4.22–5.81)
WBC: 6.8 10*3/uL (ref 4.5–10.5)

## 2013-01-15 LAB — LIPID PANEL
Cholesterol: 151 mg/dL (ref 0–200)
VLDL: 11.6 mg/dL (ref 0.0–40.0)

## 2013-01-15 MED ORDER — INSULIN ASPART 100 UNIT/ML ~~LOC~~ SOLN: 6.0000 [IU] | Freq: Three times a day (TID) | SUBCUTANEOUS | Status: DC

## 2013-01-15 MED ORDER — INSULIN GLARGINE 100 UNIT/ML ~~LOC~~ SOLN: 30.0000 [IU] | Freq: Every evening | SUBCUTANEOUS | Status: DC

## 2013-01-15 NOTE — Progress Notes (Signed)
Patient ID: [name], male   DOB: June 23, 1930, 79 y.o.   MRN: [id number]  [name] is an 79 y.o. male.   Reason for Appointment: Diabetes follow-up   History of Present Illness   Diagnosis: Type 2 DIABETES MELITUS, long-standing     He has been on insulin for several years with usually fair control His A1c generally is higher than expected for his home blood sugars Over the last couple of years because of weight loss he has required much less insulin Although his morning sugars are looking good he is not checking readings after meals despite reminders Currently taking only small doses of mealtime insulin and not adjusting based on meal size  Oral hypoglycemic drugs: None, previously on metformin which was stopped when he had weight loss        Side effects from medications: None Insulin regimen: NovoLog 3 units a.c., Lantus 30 units at bedtime           Proper timing of medications in relation to meals: Yes.         Monitors blood glucose: Once a day.    Glucometer: One Touch.          Blood Glucose readings from meter download: readings before breakfast: Average 125, range 105-134, pH breakfast 178 today, lunch 111  Hypoglycemia frequency:  none recently.          Meals: 3 meals per day.          Physical activity:  a little walking         The last HbgA1c report is not available   Wt Readings from Last 3 Encounters:  01/15/13 215 lb 11.2 oz (97.841 kg)  11/18/11 223 lb 1.7 oz (101.2 kg)  11/18/11 223 lb 1.7 oz (101.2 kg)       Medication List       This list is accurate as of: 01/15/13 10:49 AM.  Always use your most recent med list.               aspirin 81 MG chewable tablet  Chew 81 mg by mouth daily.     calcium-vitamin D 500-200 MG-UNIT per tablet  Commonly known as:  OSCAL WITH D  Take 1 tablet by mouth daily.     carvedilol 12.5 MG tablet  Commonly known as:  COREG  Take 12.5 mg by mouth 2 (two) times daily with a meal.     cholecalciferol 1000  UNITS tablet  Commonly known as:  VITAMIN D  Take 1,000 Units by mouth daily.     cyanocobalamin 500 MCG tablet  Take 500 mcg by mouth daily.     glucose blood test strip  Commonly known as:  ACCU-CHEK AVIVA  Use as instructed to check blood sugars times per day     insulin aspart 100 UNIT/ML injection  Commonly known as:  novoLOG  Inject 3 Units into the skin 3 (three) times daily with meals.     insulin glargine 100 UNIT/ML injection  Commonly known as:  LANTUS  Inject 30 Units into the skin every evening.        Allergies: No Known Allergies  Past Medical History  Diagnosis Date  . Diabetes mellitus   . ED (erectile dysfunction)   . Hypertension   . Hyperlipidemia   . Pneumonia     hx of  . CHF (congestive heart failure)   . Urination frequency   . Cancer     hx  of prostate ca  . Neuromuscular disorder     numbness in hand/cervical issues  . Cataracts, bilateral     hx of  . Syncope     hx of    Past Surgical History  Procedure Laterality Date  . Hernia repair    . Tonsillectomy    . Eye surgery      cataract surgery bilateral  . Small intestine surgery      hx of  . Prostate surgery      s/p ca  . Posterior cervical fusion/foraminotomy  11/18/2011    Procedure: POSTERIOR CERVICAL FUSION/FORAMINOTOMY LEVEL 1;  Surgeon: Tia Alert, MD;  Location: [facility];  Service: Neurosurgery;  Laterality: Bilateral;    No family history on file.  Social History:  reports that he has never smoked. He does not have any smokeless tobacco history on file. He reports that he drinks about 1.2 ounces of alcohol per week. He reports that he does not use illicit drugs.  Review of Systems:  HYPERTENSION:  this has been relatively mild and well controlled now, also occasionally checks at home with readings about 120/70 Currently only on Coreg   Previous records to be reviewed for lipid management  Has history of CHF, currently not having any shortness of breath or  edema     Examination:   BP 120/74  Pulse 60  Temp(Src) 97.9 F (36.6 C)  Resp 12  Ht 5\' 9"  (1.753 m)  Wt 215 lb 11.2 oz (97.841 kg)  BMI 31.84 kg/m2  SpO2 97%  Body mass index is 31.84 kg/(m^2).   Heart sounds normal Lungs clear No pedal edema present  ASSESSMENT/ PLAN::   Diabetes type 2   The patient's diabetes control appears to be fairly well controlled but is only taking fasting readings. Discussed monitoring after meals also  HYPERTENSION: Excellent control and will continue same medications, will need to check microalbumin   [name] 01/15/2013, 10:49 AM   Addendum: Labs show A1c 8.8%, needs more postprandial monitoring  Office Visit on 01/15/2013  Component Date Value Range Status  . WBC 01/15/2013 6.8  4.5 - 10.5 K/uL Final  . RBC 01/15/2013 4.15* 4.22 - 5.81 Mil/uL Final  . Hemoglobin 01/15/2013 13.0  13.0 - 17.0 g/dL Final  . HCT 14/78/2956 38.9* 39.0 - 52.0 % Final  . MCV 01/15/2013 93.8  78.0 - 100.0 fl Final  . MCHC 01/15/2013 33.4  30.0 - 36.0 g/dL Final  . RDW 21/30/8657 14.3  11.5 - 14.6 % Final  . Platelets 01/15/2013 171.0  150.0 - 400.0 K/uL Final  . Neutrophils Relative % 01/15/2013 68.9  43.0 - 77.0 % Final  . Lymphocytes Relative 01/15/2013 22.0  12.0 - 46.0 % Final  . Monocytes Relative 01/15/2013 6.2  3.0 - 12.0 % Final  . Eosinophils Relative 01/15/2013 2.4  0.0 - 5.0 % Final  . Basophils Relative 01/15/2013 0.5  0.0 - 3.0 % Final  . Neutro Abs 01/15/2013 4.7  1.4 - 7.7 K/uL Final  . Lymphs Abs 01/15/2013 1.5  0.7 - 4.0 K/uL Final  . Monocytes Absolute 01/15/2013 0.4  0.1 - 1.0 K/uL Final  . Eosinophils Absolute 01/15/2013 0.2  0.0 - 0.7 K/uL Final  . Basophils Absolute 01/15/2013 0.0  0.0 - 0.1 K/uL Final  . Sodium 01/15/2013 140  135 - 145 mEq/L Final  . Potassium 01/15/2013 4.1  3.5 - 5.1 mEq/L Final  . Chloride 01/15/2013 105  96 - 112 mEq/L Final  .  CO2 01/15/2013 29  19 - 32 mEq/L Final  . Glucose, Bld 01/15/2013 90  70 - 99  mg/dL Final  . BUN 96/08/5407 18  6 - 23 mg/dL Final  . Creatinine, Ser 01/15/2013 1.0  0.4 - 1.5 mg/dL Final  . Total Bilirubin 01/15/2013 0.5  0.3 - 1.2 mg/dL Final  . Alkaline Phosphatase 01/15/2013 107  39 - 117 U/L Final  . AST 01/15/2013 19  0 - 37 U/L Final  . ALT 01/15/2013 14  0 - 53 U/L Final  . Total Protein 01/15/2013 7.0  6.0 - 8.3 g/dL Final  . Albumin 81/19/1478 3.7  3.5 - 5.2 g/dL Final  . Calcium 29/56/2130 9.5  8.4 - 10.5 mg/dL Final  . GFR 86/57/8469 89.34  >60.00 mL/min Final  . Hemoglobin A1C 01/15/2013 8.8* 4.6 - 6.5 % Final   Glycemic Control Guidelines for People with Diabetes:Non Diabetic:  <6%Goal of Therapy: <7%Additional Action Suggested:  >8%   . Cholesterol 01/15/2013 151  0 - 200 mg/dL Final   ATP III Classification       Desirable:  < 200 mg/dL               Borderline High:  200 - 239 mg/dL          High:  > = 629 mg/dL  . Triglycerides 01/15/2013 58.0  0.0 - 149.0 mg/dL Final   Normal:  <528 mg/dLBorderline High:  150 - 199 mg/dL  . HDL 01/15/2013 53.70  >39.00 mg/dL Final  . VLDL 41/32/4401 11.6  0.0 - 40.0 mg/dL Final  . LDL Cholesterol 01/15/2013 86  0 - 99 mg/dL Final  . Total CHOL/HDL Ratio 01/15/2013 3   Final                  Men          Women1/2 Average Risk     3.4          3.3Average Risk          5.0          4.42X Average Risk          9.6          7.13X Average Risk          15.0          11.0                      . Microalb, Ur 01/15/2013 7.6* 0.0 - 1.9 mg/dL Final  . Creatinine,U 02/72/5366 62.5   Final  . Microalb Creat Ratio 01/15/2013 12.2  0.0 - 30.0 mg/g Final

## 2013-01-15 NOTE — Patient Instructions (Signed)
Please check blood sugars at least half the time about 2 hours after any meal and as directed on waking up.   Please bring blood sugar monitor to each visit  

## 2013-02-07 DIAGNOSIS — C61 Malignant neoplasm of prostate: Secondary | ICD-10-CM | POA: Diagnosis not present

## 2013-02-14 DIAGNOSIS — K5900 Constipation, unspecified: Secondary | ICD-10-CM | POA: Diagnosis not present

## 2013-02-14 DIAGNOSIS — M899 Disorder of bone, unspecified: Secondary | ICD-10-CM | POA: Diagnosis not present

## 2013-02-14 DIAGNOSIS — E559 Vitamin D deficiency, unspecified: Secondary | ICD-10-CM | POA: Diagnosis not present

## 2013-02-14 DIAGNOSIS — C61 Malignant neoplasm of prostate: Secondary | ICD-10-CM | POA: Diagnosis not present

## 2013-02-17 DIAGNOSIS — Z23 Encounter for immunization: Secondary | ICD-10-CM | POA: Diagnosis not present

## 2013-03-07 ENCOUNTER — Other Ambulatory Visit: Payer: Self-pay | Admitting: *Deleted

## 2013-03-07 MED ORDER — SILDENAFIL CITRATE 100 MG PO TABS: 100.0000 mg | ORAL_TABLET | Freq: Every day | ORAL | Status: DC | PRN

## 2013-03-26 ENCOUNTER — Other Ambulatory Visit: Payer: Self-pay | Admitting: *Deleted

## 2013-03-26 MED ORDER — ATORVASTATIN CALCIUM 40 MG PO TABS: 40.0000 mg | ORAL_TABLET | Freq: Every day | ORAL | Status: DC

## 2013-03-30 ENCOUNTER — Ambulatory Visit (INDEPENDENT_AMBULATORY_CARE_PROVIDER_SITE_OTHER): Payer: Medicare Other

## 2013-03-30 VITALS — BP 150/76 | HR 72 | Resp 18

## 2013-03-30 DIAGNOSIS — E1140 Type 2 diabetes mellitus with diabetic neuropathy, unspecified: Secondary | ICD-10-CM

## 2013-03-30 DIAGNOSIS — E1142 Type 2 diabetes mellitus with diabetic polyneuropathy: Secondary | ICD-10-CM

## 2013-03-30 DIAGNOSIS — L608 Other nail disorders: Secondary | ICD-10-CM

## 2013-03-30 DIAGNOSIS — E1149 Type 2 diabetes mellitus with other diabetic neurological complication: Secondary | ICD-10-CM | POA: Diagnosis not present

## 2013-03-30 NOTE — Progress Notes (Signed)
  Subjective:    Patient ID: [name], male    DOB: Apr 17, 1931, 79 y.o.   MRN: [id number]  HPItrim my nails    Review of Systems  Constitutional: Negative.   HENT: Negative.   Eyes: Negative.   Respiratory: Negative.   Cardiovascular: Negative.   Gastrointestinal: Negative.   Endocrine: Negative.   Genitourinary: Negative.   Musculoskeletal: Negative.   Skin: Negative.   Allergic/Immunologic: Negative.   Neurological: Negative.   Hematological: Bruises/bleeds easily.  Psychiatric/Behavioral: Negative.        Objective:   Physical Exam Neurovascular status is intact with pedal pulses DP +2/4 bilateral PT plus one over 4 bilateral. Capillary fill time 3 seconds all digits. Epicritic and proprioceptive sensations intact although diminished on Semmes Weinstein testing to forefoot and plantar digits. Dermatologically skin color pigment normal hair growth absent nails criptotic incurvated friable and discolored 1 through 5 bilateral. No secondary infection is no open wounds or ulcerations are noted. Orthopedic biomechanical exam Marco for rectus foot type semirigid digital contractures 2 through 5.       Assessment & Plan:  Diabetes with peripheral neuropathy. Thick brittle friable criptotic nails debrided 1 through 5 bilateral the presence of diabetes and complicating factors. Return for future palliative care [facility] months or as needed  Alvan Dame DPM

## 2013-03-30 NOTE — Patient Instructions (Addendum)
Diabetes and Foot Care Diabetes may cause you to have problems because of poor blood supply (circulation) to your feet and legs. This may cause the skin on your feet to become thinner, break easier, and heal more slowly. Your skin may become dry, and the skin may peel and crack. You may also have nerve damage in your legs and feet causing decreased feeling in them. You may not notice minor injuries to your feet that could lead to infections or more serious problems. Taking care of your feet is one of the most important things you can do for yourself.  HOME CARE INSTRUCTIONS  Wear shoes at all times, even in the house. Do not go barefoot. Bare feet are easily injured.  Check your feet daily for blisters, cuts, and redness. If you cannot see the bottom of your feet, use a mirror or ask someone for help.  Wash your feet with warm water (do not use hot water) and mild soap. Then pat your feet and the areas between your toes until they are completely dry. Do not soak your feet as this can dry your skin.  Apply a moisturizing lotion or petroleum jelly (that does not contain alcohol and is unscented) to the skin on your feet and to dry, brittle toenails. Do not apply lotion between your toes.  Trim your toenails straight across. Do not dig under them or around the cuticle. File the edges of your nails with an emery board or nail file.  Do not cut corns or calluses or try to remove them with medicine.  Wear clean socks or stockings every day. Make sure they are not too tight. Do not wear knee-high stockings since they may decrease blood flow to your legs.  Wear shoes that fit properly and have enough cushioning. To break in new shoes, wear them for just a few hours a day. This prevents you from injuring your feet. Always look in your shoes before you put them on to be sure there are no objects inside.  Do not cross your legs. This may decrease the blood flow to your feet.  If you find a minor scrape,  cut, or break in the skin on your feet, keep it and the skin around it clean and dry. These areas may be cleansed with mild soap and water. Do not cleanse the area with peroxide, alcohol, or iodine.  When you remove an adhesive bandage, be sure not to damage the skin around it.  If you have a wound, look at it several times a day to make sure it is healing.  Do not use heating pads or hot water bottles. They may burn your skin. If you have lost feeling in your feet or legs, you may not know it is happening until it is too late.  Make sure your health care provider performs a complete foot exam at least annually or more often if you have foot problems. Report any cuts, sores, or bruises to your health care provider immediately. SEEK MEDICAL CARE IF:   You have an injury that is not healing.  You have cuts or breaks in the skin.  You have an ingrown nail.  You notice redness on your legs or feet.  You feel burning or tingling in your legs or feet.  You have pain or cramps in your legs and feet.  Your legs or feet are numb.  Your feet always feel cold. SEEK IMMEDIATE MEDICAL CARE IF:   There is increasing redness,   swelling, or pain in or around a wound.  There is a red line that goes up your leg.  Pus is coming from a wound.  You develop a fever or as directed by your health care provider.  You notice a bad smell coming from an ulcer or wound. Document Released: 04/23/2000 Document Revised: 12/27/2012 Document Reviewed: 10/03/2012 [employer] Patient Information 2014 [employer].  

## 2013-04-11 ENCOUNTER — Other Ambulatory Visit (INDEPENDENT_AMBULATORY_CARE_PROVIDER_SITE_OTHER): Payer: Medicare Other

## 2013-04-11 DIAGNOSIS — E119 Type 2 diabetes mellitus without complications: Secondary | ICD-10-CM | POA: Diagnosis not present

## 2013-04-11 LAB — BASIC METABOLIC PANEL
BUN: 18 mg/dL (ref 6–23)
Chloride: 104 mEq/L (ref 96–112)
Potassium: 4.1 mEq/L (ref 3.5–5.1)
Sodium: 138 mEq/L (ref 135–145)

## 2013-04-11 LAB — HEMOGLOBIN A1C: Hgb A1c MFr Bld: 8.6 % — ABNORMAL HIGH (ref 4.6–6.5)

## 2013-04-16 ENCOUNTER — Ambulatory Visit (INDEPENDENT_AMBULATORY_CARE_PROVIDER_SITE_OTHER): Payer: Medicare Other | Admitting: Endocrinology

## 2013-04-16 ENCOUNTER — Other Ambulatory Visit: Payer: Self-pay | Admitting: *Deleted

## 2013-04-16 ENCOUNTER — Encounter: Payer: Self-pay | Admitting: Endocrinology

## 2013-04-16 VITALS — BP 124/78 | HR 66 | Temp 98.5°F | Resp 12 | Ht 69.0 in | Wt 221.8 lb

## 2013-04-16 DIAGNOSIS — E538 Deficiency of other specified B group vitamins: Secondary | ICD-10-CM

## 2013-04-16 DIAGNOSIS — I10 Essential (primary) hypertension: Secondary | ICD-10-CM

## 2013-04-16 DIAGNOSIS — E785 Hyperlipidemia, unspecified: Secondary | ICD-10-CM

## 2013-04-16 DIAGNOSIS — E119 Type 2 diabetes mellitus without complications: Secondary | ICD-10-CM | POA: Diagnosis not present

## 2013-04-16 MED ORDER — ONETOUCH DELICA LANCETS FINE MISC: Status: DC

## 2013-04-16 MED ORDER — IRBESARTAN 300 MG PO TABS: 300.0000 mg | ORAL_TABLET | Freq: Every day | ORAL | Status: DC

## 2013-04-16 MED ORDER — GLUCOSE BLOOD VI STRP: ORAL_STRIP | Status: DC

## 2013-04-16 NOTE — Progress Notes (Signed)
Patient ID: [name], male   DOB: October 16, 1930, 79 y.o.   MRN: [id number]   Reason for Appointment: Diabetes follow-up   History of Present Illness   Diagnosis: Type 2 DIABETES MELITUS, long-standing     He has been on basal bolus regimen of insulin for several years with usually fair control His A1c generally is higher than expected for his home blood sugars Over the last couple of years because of weight loss he has required much less mealtime insulin Currently taking only 3 units of mealtime insulin. He does take 5 units if he is not watching his diet are going out to eat which does not result in hypoglycemia However not clear what his readings are after meals, some readings around 8 PM at home may be after eating but not marked on his monitor  Oral hypoglycemic drugs: None, previously on metformin which was stopped when he had weight loss        Side effects from medications: None Insulin regimen: NovoLog 3-5 units a.c., Lantus 30 units at bedtime           Proper timing of medications in relation to meals: Yes.         Monitors blood glucose: Once a day.    Glucometer:  Accu-Chek         Blood Glucose readings from meter download: Overall average 110, a.m. 88-114 and p.m. 92-138 Hypoglycemia frequency:  rarely.          Meals: 3 meals per day.          Physical activity:  a little walking      Eye exam 11/13    Wt Readings from Last 3 Encounters:  04/16/13 221 lb 12.8 oz (100.608 kg)  01/15/13 215 lb 11.2 oz (97.841 kg)  11/18/11 223 lb 1.7 oz (101.2 kg)   Lab Results  Component Value Date   HGBA1C 8.6* 04/11/2013   HGBA1C 8.8* 01/15/2013   Lab Results  Component Value Date   MICROALBUR 7.6* 01/15/2013   LDLCALC 86 01/15/2013   CREATININE 1.2 04/11/2013   Appointment on 04/11/2013  Component Date Value Range Status  . Sodium 04/11/2013 138  135 - 145 mEq/L Final  . Potassium 04/11/2013 4.1  3.5 - 5.1 mEq/L Final  . Chloride 04/11/2013 104  96 - 112 mEq/L Final  . CO2  04/11/2013 26  19 - 32 mEq/L Final  . Glucose, Bld 04/11/2013 78  70 - 99 mg/dL Final  . BUN 40/98/1191 18  6 - 23 mg/dL Final  . Creatinine, Ser 04/11/2013 1.2  0.4 - 1.5 mg/dL Final  . Calcium 47/82/9562 9.3  8.4 - 10.5 mg/dL Final  . GFR 13/12/6576 77.84  >60.00 mL/min Final  . Hemoglobin A1C 04/11/2013 8.6* 4.6 - 6.5 % Final   Glycemic Control Guidelines for People with Diabetes:Non Diabetic:  <6%Goal of Therapy: <7%Additional Action Suggested:  >8%         Medication List       This list is accurate as of: 04/16/13  3:36 PM.  Always use your most recent med list.               aspirin 81 MG chewable tablet  Chew 81 mg by mouth daily.     atorvastatin 40 MG tablet  Commonly known as:  LIPITOR  Take 1 tablet (40 mg total) by mouth daily.     calcium-vitamin D 500-200 MG-UNIT per tablet  Commonly known as:  OSCAL WITH  D  Take 1 tablet by mouth daily.     carvedilol 12.5 MG tablet  Commonly known as:  COREG  Take 12.5 mg by mouth 2 (two) times daily with a meal.     cholecalciferol 1000 UNITS tablet  Commonly known as:  VITAMIN D  Take 1,000 Units by mouth daily.     clotrimazole-betamethasone cream  Commonly known as:  LOTRISONE     cyanocobalamin 500 MCG tablet  Take 500 mcg by mouth daily.     glucose blood test strip  Commonly known as:  ACCU-CHEK AVIVA  Use as instructed to check blood sugars times per day     hydrochlorothiazide 25 MG tablet  Commonly known as:  HYDRODIURIL     insulin aspart 100 UNIT/ML injection  Commonly known as:  novoLOG  Inject 6 Units into the skin 3 (three) times daily with meals.     insulin glargine 100 UNIT/ML injection  Commonly known as:  LANTUS  Inject 0.3 mLs (30 Units total) into the skin every evening.     irbesartan 300 MG tablet  Commonly known as:  AVAPRO  Take 1 tablet (300 mg total) by mouth daily.     polyethylene glycol packet  Commonly known as:  MIRALAX / GLYCOLAX     sildenafil 100 MG tablet   Commonly known as:  VIAGRA  Take 1 tablet (100 mg total) by mouth daily as needed for erectile dysfunction.        Allergies: No Known Allergies  Past Medical History  Diagnosis Date  . Diabetes mellitus   . ED (erectile dysfunction)   . Hypertension   . Hyperlipidemia   . Pneumonia     hx of  . CHF (congestive heart failure)   . Urination frequency   . Cancer     hx of prostate ca  . Neuromuscular disorder     numbness in hand/cervical issues  . Cataracts, bilateral     hx of  . Syncope     hx of    Past Surgical History  Procedure Laterality Date  . Hernia repair    . Tonsillectomy    . Eye surgery      cataract surgery bilateral  . Small intestine surgery      hx of  . Prostate surgery      s/p ca  . Posterior cervical fusion/foraminotomy  11/18/2011    Procedure: POSTERIOR CERVICAL FUSION/FORAMINOTOMY LEVEL 1;  Surgeon: Tia Alert, MD;  Location: [facility];  Service: Neurosurgery;  Laterality: Bilateral;    No family history on file.  Social History:  reports that he has never smoked. He has never used smokeless tobacco. He reports that he drinks about 1.2 ounces of alcohol per week. He reports that he does not use illicit drugs.  Review of Systems:  HYPERTENSION:  this has been relatively mild and well controlled now, also occasionally checks at home with readings about 120/70 Currently only on Coreg  Has history of CHF, currently not having any shortness of breath or edema  He has had transient anemia in the past, continues to take B12 supplements  Hyperlipidemia: Well controlled, last LDL below 100 Lab Results  Component Value Date   CHOL 151 01/15/2013   HDL 53.70 01/15/2013   LDLCALC 86 01/15/2013   TRIG 58.0 01/15/2013   CHOLHDL 3 01/15/2013       Examination:   BP 124/78  Pulse 66  Temp(Src) 98.5 F (36.9 C)  Resp  12  Ht 5\' 9"  (1.753 m)  Wt 221 lb 12.8 oz (100.608 kg)  BMI 32.74 kg/m2  SpO2 95%  Body mass index is 32.74 kg/(m^2).     No pedal edema present Foot exam done today  ASSESSMENT/ PLAN::   Diabetes type 2   The patient's diabetes control appears to be fairly well controlled but still has a disproportionately very high A1c Although he has done a few readings and evenings not clear which are after meals and most likely may be having higher postprandial readings to account for his high A1c which is persistent Discussed taking more readings after meals to help adjust the dose of NovoLog He can continue to take 5 units for any larger meals Discussed that if blood sugars are over 180 even need at least 2 more units of insulin before that particular meal Since fasting readings are excellent will continue 30 units of Lantus Will also check fructosamine on the next visit to confirm degree of hyperglycemia  Total visit time including counseling = 25 minutes  HYPERTENSION: Excellent control and will continue same medications  Renal function continues to be normal, previously has had high microalbumin but this has been normal in September  [facility] 04/16/2013, 3:36 PM

## 2013-04-16 NOTE — Patient Instructions (Signed)
Please check blood sugars at least half the time about 2 hours after any meal and as directed on waking up.   Please bring blood sugar monitor to each visit  

## 2013-04-18 ENCOUNTER — Other Ambulatory Visit: Payer: Self-pay | Admitting: *Deleted

## 2013-04-18 ENCOUNTER — Telehealth: Payer: Self-pay | Admitting: Endocrinology

## 2013-04-18 MED ORDER — GLUCOSE BLOOD VI STRP: ORAL_STRIP | Status: DC

## 2013-04-18 NOTE — Telephone Encounter (Signed)
Pt's daughter called and stated that Mr. Mullenbach needs his one touch IQ refilled Call back: [phone]  Thank You :)

## 2013-04-23 ENCOUNTER — Other Ambulatory Visit: Payer: Self-pay | Admitting: *Deleted

## 2013-04-23 MED ORDER — IRBESARTAN 300 MG PO TABS: 300.0000 mg | ORAL_TABLET | Freq: Every day | ORAL | Status: DC

## 2013-04-27 ENCOUNTER — Ambulatory Visit (INDEPENDENT_AMBULATORY_CARE_PROVIDER_SITE_OTHER): Payer: Medicare Other | Admitting: Interventional Cardiology

## 2013-04-27 ENCOUNTER — Encounter: Payer: Self-pay | Admitting: Interventional Cardiology

## 2013-04-27 VITALS — BP 142/70 | HR 72 | Ht 69.0 in | Wt 212.0 lb

## 2013-04-27 DIAGNOSIS — I447 Left bundle-branch block, unspecified: Secondary | ICD-10-CM | POA: Diagnosis not present

## 2013-04-27 DIAGNOSIS — E119 Type 2 diabetes mellitus without complications: Secondary | ICD-10-CM | POA: Diagnosis not present

## 2013-04-27 DIAGNOSIS — I10 Essential (primary) hypertension: Secondary | ICD-10-CM

## 2013-04-27 DIAGNOSIS — I5042 Chronic combined systolic (congestive) and diastolic (congestive) heart failure: Secondary | ICD-10-CM | POA: Diagnosis not present

## 2013-04-27 MED ORDER — HYDROCHLOROTHIAZIDE 12.5 MG PO CAPS: 12.5000 mg | ORAL_CAPSULE | Freq: Every day | ORAL | Status: DC

## 2013-04-27 NOTE — Patient Instructions (Signed)
Your physician has recommended you make the following change in your medication:   1. Decrease Hydrochlorothiazide 12.5mg  once daily  Your physician wants you to follow-up in: 6-9 months with Dr. Katrinka Blazing. You will receive a reminder letter in the mail two months in advance. If you don't receive a letter, please call our office to schedule the follow-up appointment.  *MONITOR FLUID LEVELS and CHEST PAINS*

## 2013-04-27 NOTE — Progress Notes (Signed)
Patient ID: [name], male   DOB: [birth year], 79 y.o.   MRN: [id number] Past Medical History  Diabetes type 2 insulin treated   HTN   Hyperlipidemia   Prostate Cancer   Anemia   Vitamin D deficiency   Schatzki's ring, esophageal dilatation in 07/2004   Acute diastolic heart failure EF 45%, and neg ischemia on nuclear      [address], [address] [place], [place]  [address] Phone: [phone] Fax:  [phone]  Date:  04/27/2013   ID:  [name], DOB Mar 21, 1931, MRN [id number]  PCP:  Reather Littler, MD   ASSESSMENT:  1.Chronic combined systolic and diastolic heart failure, asymptomatic. His primary physician is decrease the diuretic from HCTZ 25 mg per day down to 12.5 mg per day. 2. Hypertension, controlled 3. Left bundle branch block, unchanged but also with first-degree AV block  PLAN:  1. Provide new prescription of HCTZ with 12.5 mg 2. Limit salt and fluid intake 3. Physical activity as tolerated   SUBJECTIVE: [name] is a 79 y.o. male who has no dyspnea or swelling. His appetite is good. He has not had syncope or palpitations. He has difficulty splitting the HCTZ 25 mg tablets. There is no orthopnea. He denies chest pain.   Wt Readings from Last 3 Encounters:  04/27/13 212 lb (96.163 kg)  04/16/13 221 lb 12.8 oz (100.608 kg)  01/15/13 215 lb 11.2 oz (97.841 kg)     Past Medical History  Diagnosis Date  . Diabetes mellitus   . ED (erectile dysfunction)   . Hypertension   . Hyperlipidemia   . Pneumonia     hx of  . CHF (congestive heart failure)   . Urination frequency   . Cancer     hx of prostate ca  . Neuromuscular disorder     numbness in hand/cervical issues  . Cataracts, bilateral     hx of  . Syncope     hx of    Current Outpatient Prescriptions  Medication Sig Dispense Refill  . aspirin 81 MG chewable tablet Chew 81 mg by mouth daily.      Marland Kitchen atorvastatin (LIPITOR) 40 MG tablet Take 1 tablet (40 mg total) by mouth daily.   90 tablet  3  . calcium-vitamin D (OSCAL WITH D) 500-200 MG-UNIT per tablet Take 1 tablet by mouth daily.      . carvedilol (COREG) 12.5 MG tablet Take 12.5 mg by mouth 2 (two) times daily with a meal.      . cholecalciferol (VITAMIN D) 1000 UNITS tablet Take 1,000 Units by mouth daily.      . clotrimazole-betamethasone (LOTRISONE) cream       . cyanocobalamin 500 MCG tablet Take 500 mcg by mouth daily.      Marland Kitchen glucose blood (ONETOUCH VERIO) test strip Use as instructed to check blood sugars 3 times a day dx code 250.02  100 each  12  . hydrochlorothiazide (HYDRODIURIL) 25 MG tablet       . insulin aspart (NOVOLOG) 100 UNIT/ML injection Inject 6 Units into the skin 3 (three) times daily with meals.  60 mL  0  . insulin glargine (LANTUS) 100 UNIT/ML injection Inject 0.3 mLs (30 Units total) into the skin every evening.  60 mL  0  . irbesartan (AVAPRO) 300 MG tablet Take 1 tablet (300 mg total) by mouth daily.  90 tablet  3  . ONETOUCH DELICA LANCETS FINE MISC Use to check blood  sugars 3 times per day  100 each  5  . polyethylene glycol (MIRALAX / GLYCOLAX) packet       . sildenafil (VIAGRA) 100 MG tablet Take 1 tablet (100 mg total) by mouth daily as needed for erectile dysfunction.  30 tablet  1   No current facility-administered medications for this visit.    Allergies:   No Known Allergies  Social History:  The patient  reports that he has never smoked. He has never used smokeless tobacco. He reports that he drinks about 1.2 ounces of alcohol per week. He reports that he does not use illicit drugs.   ROS:  Please see the history of present illness.   Denies palpitations. No transient neurological symptoms. No claudication.   All other systems reviewed and negative.   OBJECTIVE: VS:  BP 142/70  Pulse 72  Ht 5\' 9"  (1.753 m)  Wt 212 lb (96.163 kg)  BMI 31.29 kg/m2 Well nourished, well developed, in no acute distress, elderly HEENT: normal Neck: JVD flat. Carotid bruit absent  Cardiac:   normal S1, S2; RRR; no murmur. An S4 gallop is audible Lungs:  clear to auscultation bilaterally, no wheezing, rhonchi or rales Abd: soft, nontender, no hepatomegaly Ext: Edema absent. Pulses 2+ Skin: warm and dry Neuro:  CNs 2-12 intact, no focal abnormalities noted  EKG:  First-degree AV block, left bundle branch block       Signed, Darci Needle III, MD 04/27/2013 10:40 AM

## 2013-05-08 ENCOUNTER — Encounter: Payer: Self-pay | Admitting: Endocrinology

## 2013-05-08 DIAGNOSIS — Z0100 Encounter for examination of eyes and vision without abnormal findings: Secondary | ICD-10-CM | POA: Diagnosis not present

## 2013-05-08 DIAGNOSIS — E119 Type 2 diabetes mellitus without complications: Secondary | ICD-10-CM | POA: Diagnosis not present

## 2013-06-29 ENCOUNTER — Ambulatory Visit: Payer: Medicare Other

## 2013-07-11 ENCOUNTER — Other Ambulatory Visit: Payer: Self-pay | Admitting: *Deleted

## 2013-07-11 MED ORDER — GLUCOSE BLOOD VI STRP: ORAL_STRIP | Status: DC

## 2013-07-13 ENCOUNTER — Other Ambulatory Visit (INDEPENDENT_AMBULATORY_CARE_PROVIDER_SITE_OTHER): Payer: Medicare Other

## 2013-07-13 DIAGNOSIS — E538 Deficiency of other specified B group vitamins: Secondary | ICD-10-CM

## 2013-07-13 DIAGNOSIS — E119 Type 2 diabetes mellitus without complications: Secondary | ICD-10-CM | POA: Diagnosis not present

## 2013-07-13 LAB — CBC
HCT: 43 % (ref 39.0–52.0)
HEMOGLOBIN: 14 g/dL (ref 13.0–17.0)
MCHC: 32.7 g/dL (ref 30.0–36.0)
MCV: 95.3 fl (ref 78.0–100.0)
PLATELETS: 167 10*3/uL (ref 150.0–400.0)
RBC: 4.51 Mil/uL (ref 4.22–5.81)
RDW: 15 % — ABNORMAL HIGH (ref 11.5–14.6)
WBC: 6.6 10*3/uL (ref 4.5–10.5)

## 2013-07-13 LAB — COMPREHENSIVE METABOLIC PANEL
ALBUMIN: 3.5 g/dL (ref 3.5–5.2)
ALT: 14 U/L (ref 0–53)
AST: 18 U/L (ref 0–37)
Alkaline Phosphatase: 110 U/L (ref 39–117)
BILIRUBIN TOTAL: 0.9 mg/dL (ref 0.3–1.2)
BUN: 16 mg/dL (ref 6–23)
CO2: 32 mEq/L (ref 19–32)
CREATININE: 1.1 mg/dL (ref 0.4–1.5)
Calcium: 9.3 mg/dL (ref 8.4–10.5)
Chloride: 105 mEq/L (ref 96–112)
GFR: 82.71 mL/min (ref >60.00)
GLUCOSE: 143 mg/dL — AB (ref 70–99)
Potassium: 3.9 mEq/L (ref 3.5–5.1)
Sodium: 139 mEq/L (ref 135–145)
Total Protein: 6.9 g/dL (ref 6.0–8.3)

## 2013-07-13 LAB — MICROALBUMIN / CREATININE URINE RATIO
CREATININE, U: 93.9 mg/dL
Microalb Creat Ratio: 62.9 mg/g — ABNORMAL HIGH (ref 0.0–30.0)
Microalb, Ur: 59 mg/dL — ABNORMAL HIGH (ref 0.0–1.9)

## 2013-07-13 LAB — LIPID PANEL
CHOLESTEROL: 136 mg/dL (ref 0–200)
HDL: 52.6 mg/dL (ref >39.00)
LDL CALC: 74 mg/dL (ref 0–99)
TRIGLYCERIDES: 49 mg/dL (ref 0.0–149.0)
Total CHOL/HDL Ratio: 3
VLDL: 9.8 mg/dL (ref 0.0–40.0)

## 2013-07-13 LAB — HEMOGLOBIN A1C: Hgb A1c MFr Bld: 9.2 % — ABNORMAL HIGH (ref 4.6–6.5)

## 2013-07-14 LAB — FRUCTOSAMINE: FRUCTOSAMINE: 338 umol/L — AB (ref <285)

## 2013-07-16 ENCOUNTER — Other Ambulatory Visit: Payer: Medicare Other

## 2013-07-17 ENCOUNTER — Ambulatory Visit (INDEPENDENT_AMBULATORY_CARE_PROVIDER_SITE_OTHER): Payer: Medicare Other

## 2013-07-17 VITALS — BP 163/82 | HR 75 | Resp 16

## 2013-07-17 DIAGNOSIS — E1149 Type 2 diabetes mellitus with other diabetic neurological complication: Secondary | ICD-10-CM

## 2013-07-17 DIAGNOSIS — L608 Other nail disorders: Secondary | ICD-10-CM | POA: Diagnosis not present

## 2013-07-17 DIAGNOSIS — E1142 Type 2 diabetes mellitus with diabetic polyneuropathy: Secondary | ICD-10-CM

## 2013-07-17 DIAGNOSIS — E1140 Type 2 diabetes mellitus with diabetic neuropathy, unspecified: Secondary | ICD-10-CM

## 2013-07-17 NOTE — Patient Instructions (Signed)
Diabetes and Foot Care Diabetes may cause you to have problems because of poor blood supply (circulation) to your feet and legs. This may cause the skin on your feet to become thinner, break easier, and heal more slowly. Your skin may become dry, and the skin may peel and crack. You may also have nerve damage in your legs and feet causing decreased feeling in them. You may not notice minor injuries to your feet that could lead to infections or more serious problems. Taking care of your feet is one of the most important things you can do for yourself.  HOME CARE INSTRUCTIONS  Wear shoes at all times, even in the house. Do not go barefoot. Bare feet are easily injured.  Check your feet daily for blisters, cuts, and redness. If you cannot see the bottom of your feet, use a mirror or ask someone for help.  Wash your feet with warm water (do not use hot water) and mild soap. Then pat your feet and the areas between your toes until they are completely dry. Do not soak your feet as this can dry your skin.  Apply a moisturizing lotion or petroleum jelly (that does not contain alcohol and is unscented) to the skin on your feet and to dry, brittle toenails. Do not apply lotion between your toes.  Trim your toenails straight across. Do not dig under them or around the cuticle. File the edges of your nails with an emery board or nail file.  Do not cut corns or calluses or try to remove them with medicine.  Wear clean socks or stockings every day. Make sure they are not too tight. Do not wear knee-high stockings since they may decrease blood flow to your legs.  Wear shoes that fit properly and have enough cushioning. To break in new shoes, wear them for just a few hours a day. This prevents you from injuring your feet. Always look in your shoes before you put them on to be sure there are no objects inside.  Do not cross your legs. This may decrease the blood flow to your feet.  If you find a minor scrape,  cut, or break in the skin on your feet, keep it and the skin around it clean and dry. These areas may be cleansed with mild soap and water. Do not cleanse the area with peroxide, alcohol, or iodine.  When you remove an adhesive bandage, be sure not to damage the skin around it.  If you have a wound, look at it several times a day to make sure it is healing.  Do not use heating pads or hot water bottles. They may burn your skin. If you have lost feeling in your feet or legs, you may not know it is happening until it is too late.  Make sure your health care provider performs a complete foot exam at least annually or more often if you have foot problems. Report any cuts, sores, or bruises to your health care provider immediately. SEEK MEDICAL CARE IF:   You have an injury that is not healing.  You have cuts or breaks in the skin.  You have an ingrown nail.  You notice redness on your legs or feet.  You feel burning or tingling in your legs or feet.  You have pain or cramps in your legs and feet.  Your legs or feet are numb.  Your feet always feel cold. SEEK IMMEDIATE MEDICAL CARE IF:   There is increasing redness,   swelling, or pain in or around a wound.  There is a red line that goes up your leg.  Pus is coming from a wound.  You develop a fever or as directed by your health care provider.  You notice a bad smell coming from an ulcer or wound. Document Released: 04/23/2000 Document Revised: 12/27/2012 Document Reviewed: 10/03/2012 [employer] Patient Information 2014 [employer].  

## 2013-07-17 NOTE — Progress Notes (Signed)
   Subjective:    Patient ID: [name], male    DOB: [birth year], 80 y.o.   MRN: [id number]  HPI Comments: "Trim my toenails"     Review of Systems no new changes or findings     Objective:   Physical Exam Neurovascular status is intact pedal pulses palpable DP postal for PT one over 4 bilateral refill time 3 seconds. On [name] testing there is decreased sensation to forefoot digits and plantar arch. Neurologically skin color pigment normal hair growth absent nails thick criptotic incurvated friable 1 through 5 bilateral open wounds ulcerations no secondary infections rectus foot type mild digital contractures lesser digits.       Assessment & Plan:  Assessment this time his diabetes with peripheral neuropathy thick brittle crumbly friable mycotic nails debrided 1 through 5 bilateral the presence of diabetes and complications return for future palliative care and as-needed basis  [name] DPM

## 2013-07-20 ENCOUNTER — Ambulatory Visit (INDEPENDENT_AMBULATORY_CARE_PROVIDER_SITE_OTHER): Payer: Medicare Other | Admitting: Endocrinology

## 2013-07-20 ENCOUNTER — Encounter: Payer: Self-pay | Admitting: Endocrinology

## 2013-07-20 VITALS — BP 122/78 | HR 76 | Temp 97.9°F | Resp 14 | Ht 69.0 in | Wt 223.6 lb

## 2013-07-20 DIAGNOSIS — I10 Essential (primary) hypertension: Secondary | ICD-10-CM | POA: Diagnosis not present

## 2013-07-20 DIAGNOSIS — E1165 Type 2 diabetes mellitus with hyperglycemia: Principal | ICD-10-CM

## 2013-07-20 DIAGNOSIS — IMO0001 Reserved for inherently not codable concepts without codable children: Secondary | ICD-10-CM | POA: Diagnosis not present

## 2013-07-20 MED ORDER — METFORMIN HCL ER 500 MG PO TB24: 1000.0000 mg | ORAL_TABLET | Freq: Every day | ORAL | Status: DC

## 2013-07-20 NOTE — Patient Instructions (Addendum)
NOVOLOG 7 UNITS AT BREAKFAST AND 5 UNITS WITH LUNCH AND SUPPER  LANTUS 32 UNITS AND IF your AM SUGAR STAYS.140, THEN go up to 34 units (may take at supper also)  Please check blood sugars at least half the time about 2 hours after any meal and daily on waking up.   Start Metformin ER 500mg  at dinner for 3 days then 2x daily  Walking program

## 2013-07-20 NOTE — Progress Notes (Signed)
Patient ID: [name], male   DOB: 06/14/1930, 80 y.o.   MRN: [id number]   Reason for Appointment: Diabetes follow-up   History of Present Illness   Diagnosis: Type 2 DIABETES MELITUS, long-standing     He has been on basal bolus regimen of insulin for several years with usually fair control His A1c generally is higher than expected for his home blood sugars Over the last couple of years because of weight loss he had required much less mealtime insulin Recent history: His blood sugars appear to be much higher than usual since his last visit and also is using a new glucose monitor His highest blood sugars are around his late lunchtime although difficult to know if he is getting a lot of snacks in between. Also fasting blood sugars are significantly high almost everyday Currently taking his 5 units of mealtime insulin consistently before eating but does not change it based on blood sugar or food intake. However not clear what his readings are after meals since he does not monitor these A1c is higher than usual  Oral hypoglycemic drugs: None, previously on metformin which was stopped when he had weight loss        Side effects from medications: None Insulin regimen: NovoLog 3-5 units a.c., Lantus 30 units at bedtime           Proper timing of medications in relation to meals: Yes.         Monitors blood glucose: Once a day.    Glucometer:  Accu-Chek         Blood Glucose readings from meter download:   PREMEAL Breakfast Lunch Dinner Bedtime Overall  Glucose range:  110-263   82-330   120-316     Mean/median:  174   187   143    182    Hypoglycemia: None recently          Meals: 3 meals per day.          Physical activity:  a little walking      Eye exam 11/13    Wt Readings from Last 3 Encounters:  07/20/13 223 lb 9.6 oz (101.424 kg)  04/27/13 212 lb (96.163 kg)  04/16/13 221 lb 12.8 oz (100.608 kg)   Lab Results  Component Value Date   HGBA1C 9.2* 07/13/2013   HGBA1C 8.6*  04/11/2013   HGBA1C 8.8* 01/15/2013   Lab Results  Component Value Date   MICROALBUR 59.0* 07/13/2013   LDLCALC 74 07/13/2013   CREATININE 1.1 07/13/2013   No visits with results within 1 Week(s) from this visit. Latest known visit with results is:  Appointment on 07/13/2013  Component Date Value Ref Range Status  . Hemoglobin A1C 07/13/2013 9.2* 4.6 - 6.5 % Final   Glycemic Control Guidelines for People with Diabetes:Non Diabetic:  <6%Goal of Therapy: <7%Additional Action Suggested:  >8%   . Sodium 07/13/2013 139  135 - 145 mEq/L Final  . Potassium 07/13/2013 3.9  3.5 - 5.1 mEq/L Final  . Chloride 07/13/2013 105  96 - 112 mEq/L Final  . CO2 07/13/2013 32  19 - 32 mEq/L Final  . Glucose, Bld 07/13/2013 143* 70 - 99 mg/dL Final  . BUN 07/13/2013 16  6 - 23 mg/dL Final  . Creatinine, Ser 07/13/2013 1.1  0.4 - 1.5 mg/dL Final  . Total Bilirubin 07/13/2013 0.9  0.3 - 1.2 mg/dL Final  . Alkaline Phosphatase 07/13/2013 110  39 - 117 U/L Final  . AST 07/13/2013  18  0 - 37 U/L Final  . ALT 07/13/2013 14  0 - 53 U/L Final  . Total Protein 07/13/2013 6.9  6.0 - 8.3 g/dL Final  . Albumin 07/13/2013 3.5  3.5 - 5.2 g/dL Final  . Calcium 07/13/2013 9.3  8.4 - 10.5 mg/dL Final  . GFR 07/13/2013 82.71  >60.00 mL/min Final  . Fructosamine 07/13/2013 338* <285 umol/L Final   Comment:                            Variations in levels of serum proteins (albumin and immunoglobulins)                          may affect fructosamine results.                             . Cholesterol 07/13/2013 136  0 - 200 mg/dL Final   ATP III Classification       Desirable:  < 200 mg/dL               Borderline High:  200 - 239 mg/dL          High:  > = 240 mg/dL  . Triglycerides 07/13/2013 49.0  0.0 - 149.0 mg/dL Final   Normal:  <150 mg/dLBorderline High:  150 - 199 mg/dL  . HDL 07/13/2013 52.60  >39.00 mg/dL Final  . VLDL 07/13/2013 9.8  0.0 - 40.0 mg/dL Final  . LDL Cholesterol 07/13/2013 74  0 - 99 mg/dL Final  .  Total CHOL/HDL Ratio 07/13/2013 3   Final                  Men          Women1/2 Average Risk     3.4          3.3Average Risk          5.0          4.42X Average Risk          9.6          7.13X Average Risk          15.0          11.0                      . Microalb, Ur 07/13/2013 59.0* 0.0 - 1.9 mg/dL Final  . Creatinine,U 07/13/2013 93.9   Final  . Microalb Creat Ratio 07/13/2013 62.9* 0.0 - 30.0 mg/g Final  . WBC 07/13/2013 6.6  4.5 - 10.5 K/uL Final  . RBC 07/13/2013 4.51  4.22 - 5.81 Mil/uL Final  . Platelets 07/13/2013 167.0  150.0 - 400.0 K/uL Final  . Hemoglobin 07/13/2013 14.0  13.0 - 17.0 g/dL Final  . HCT 07/13/2013 43.0  39.0 - 52.0 % Final  . MCV 07/13/2013 95.3  78.0 - 100.0 fl Final  . MCHC 07/13/2013 32.7  30.0 - 36.0 g/dL Final  . RDW 07/13/2013 15.0* 11.5 - 14.6 % Final        Medication List       This list is accurate as of: 07/20/13 10:04 AM.  Always use your most recent med list.               aspirin 81 MG chewable tablet  Chew 81 mg by mouth daily.  atorvastatin 40 MG tablet  Commonly known as:  LIPITOR  Take 1 tablet (40 mg total) by mouth daily.     calcium-vitamin D 500-200 MG-UNIT per tablet  Commonly known as:  OSCAL WITH D  Take 1 tablet by mouth daily.     carvedilol 12.5 MG tablet  Commonly known as:  COREG  Take 12.5 mg by mouth 2 (two) times daily with a meal.     cholecalciferol 1000 UNITS tablet  Commonly known as:  VITAMIN D  Take 1,000 Units by mouth daily.     clotrimazole-betamethasone cream  Commonly known as:  LOTRISONE     cyanocobalamin 500 MCG tablet  Take 500 mcg by mouth daily.     gabapentin 300 MG capsule  Commonly known as:  NEURONTIN     glucose blood test strip  Commonly known as:  ONETOUCH VERIO  Use as instructed to check blood sugars 3 times a day dx code 250.02     hydrochlorothiazide 12.5 MG capsule  Commonly known as:  MICROZIDE  Take 1 capsule (12.5 mg total) by mouth daily.     insulin aspart  100 UNIT/ML injection  Commonly known as:  novoLOG  Inject 5 Units into the skin 3 (three) times daily with meals.     insulin glargine 100 UNIT/ML injection  Commonly known as:  LANTUS  Inject 0.3 mLs (30 Units total) into the skin every evening.     irbesartan 300 MG tablet  Commonly known as:  AVAPRO  Take 1 tablet (300 mg total) by mouth daily.     ONETOUCH DELICA LANCETS FINE Misc  Use to check blood sugars 3 times per day     polyethylene glycol packet  Commonly known as:  MIRALAX / GLYCOLAX     sildenafil 100 MG tablet  Commonly known as:  VIAGRA  Take 1 tablet (100 mg total) by mouth daily as needed for erectile dysfunction.        Allergies: No Known Allergies  Past Medical History  Diagnosis Date  . Diabetes mellitus   . ED (erectile dysfunction)   . Hypertension   . Hyperlipidemia   . Pneumonia     hx of  . CHF (congestive heart failure)   . Urination frequency   . Cancer     hx of prostate ca  . Neuromuscular disorder     numbness in hand/cervical issues  . Cataracts, bilateral     hx of  . Syncope     hx of    Past Surgical History  Procedure Laterality Date  . Hernia repair    . Tonsillectomy    . Eye surgery      cataract surgery bilateral  . Small intestine surgery      hx of  . Prostate surgery      s/p ca  . Posterior cervical fusion/foraminotomy  11/18/2011    Procedure: POSTERIOR CERVICAL FUSION/FORAMINOTOMY LEVEL 1;  Surgeon: Eustace Moore, MD;  Location: [facility];  Service: Neurosurgery;  Laterality: Bilateral;    No family history on file.  Social History:  reports that he has never smoked. He has never used smokeless tobacco. He reports that he drinks about 1.2 ounces of alcohol per week. He reports that he does not use illicit drugs.  Review of Systems:  HYPERTENSION:  this has been well controlled now, also occasionally checks at home with readings about 120/70 Currently on Coreg And HCTZ  Has history of CHF, currently  not having any shortness of breath or edema  He has had transient anemia in the past, continues to take B12 supplementsAnd hemoglobin is excellent  Hyperlipidemia: Well controlled  with Lipitor 40 mg  Lab Results  Component Value Date   CHOL 136 07/13/2013   HDL 52.60 07/13/2013   LDLCALC 74 07/13/2013   TRIG 49.0 07/13/2013   CHOLHDL 3 07/13/2013     Foot exam done on 04/16/13   Examination:   BP 122/78  Pulse 76  Temp(Src) 97.9 F (36.6 C)  Resp 14  Ht 5\' 9"  (1.753 m)  Wt 223 lb 9.6 oz (101.424 kg)  BMI 33.00 kg/m2  SpO2 98%  Body mass index is 33 kg/(m^2).    No pedal edema present  ASSESSMENT/ PLAN::   Diabetes type 2   The patient's diabetes control appears to be significantly worse with mainly higher readings fasting and in the afternoons Also may be getting high postprandial readings which are not monitored A1c is over 9% now Recommendations made:  Previously had benefited from Actoplusmet but because of his history of CHF will start him on metformin only Discussed benefits from this medication and possible side effects, may be well to titrate to maximum doses since his renal function is good  NOVOLOG 7 UNITS AT BREAKFAST AND 5 UNITS WITH LUNCH AND SUPPER  LANTUS 32 UNITS AND IF A.M. SUGAR >140, THEN go up to 34 units  Will check fructosamine on the next visit to confirm degree of hyperglycemia  Encouraged him to walk more regularly  HYPERTENSION: Excellent control and will continue same medications   Sharonica Kraszewski 07/20/2013, 10:04 AM

## 2013-08-17 ENCOUNTER — Encounter: Payer: Self-pay | Admitting: Endocrinology

## 2013-08-17 ENCOUNTER — Other Ambulatory Visit: Payer: Self-pay | Admitting: *Deleted

## 2013-08-17 ENCOUNTER — Ambulatory Visit (INDEPENDENT_AMBULATORY_CARE_PROVIDER_SITE_OTHER): Payer: Medicare Other | Admitting: Endocrinology

## 2013-08-17 VITALS — BP 118/62 | HR 75 | Temp 98.5°F | Resp 16 | Ht 69.0 in | Wt 215.0 lb

## 2013-08-17 DIAGNOSIS — D510 Vitamin B12 deficiency anemia due to intrinsic factor deficiency: Secondary | ICD-10-CM | POA: Diagnosis not present

## 2013-08-17 DIAGNOSIS — E1165 Type 2 diabetes mellitus with hyperglycemia: Principal | ICD-10-CM

## 2013-08-17 DIAGNOSIS — IMO0001 Reserved for inherently not codable concepts without codable children: Secondary | ICD-10-CM | POA: Diagnosis not present

## 2013-08-17 DIAGNOSIS — I10 Essential (primary) hypertension: Secondary | ICD-10-CM | POA: Diagnosis not present

## 2013-08-17 MED ORDER — GLUCOSE BLOOD VI STRP: ORAL_STRIP | Status: DC

## 2013-08-17 NOTE — Progress Notes (Signed)
Patient ID: [name], male   DOB: 1931/04/05, 80 y.o.   MRN: [id number]   Reason for Appointment: Diabetes follow-up   History of Present Illness   Diagnosis: Type 2 DIABETES MELITUS, long-standing     He has been on basal bolus regimen of insulin for several years with usually fair control His A1c generally is higher than expected for his home blood sugars Over the last couple of years because of weight loss he had required much less mealtime insulin Recent history: His blood sugars appear to be somewhat better than last time when insulin was increased but still significantly high His highest readings appear to be after supper This is despite his starting to be more active and losing some weight He was told on his last visit to increase his morning NovoLog to 7 units and Lantus to at least 32 units but not clear if he is following his instructions He does not think his diet is as inconsistent and he is reducing his snacks Fasting blood sugars are recently below 150 mostly  Oral hypoglycemic drugs:   metformin ER 1g          Side effects from medications: 2g Insulin regimen: NovoLog 5 units a.c., Lantus 30 units at bedtime           Proper timing of medications in relation to meals: Yes.         Monitors blood glucose: Once a day.    Glucometer:  Accu-Chek         Blood Glucose readings from meter download:   PREMEAL Breakfast  PC Lunch  PC Dinner Bedtime Overall  Glucose range:  124-215   91-160   167-308     Mean/median:  176   120  202    174    Hypoglycemia: None recently          Meals: 3 meals per day.          Physical activity:  walking 15-20 min on most days      Eye exam last  12/14   Wt Readings from Last 3 Encounters:  08/17/13 215 lb (97.523 kg)  07/20/13 223 lb 9.6 oz (101.424 kg)  04/27/13 212 lb (96.163 kg)   Lab Results  Component Value Date   HGBA1C 9.2* 07/13/2013   HGBA1C 8.6* 04/11/2013   HGBA1C 8.8* 01/15/2013   Lab Results  Component Value Date    MICROALBUR 59.0* 07/13/2013   LDLCALC 74 07/13/2013   CREATININE 1.1 07/13/2013   No visits with results within 1 Week(s) from this visit. Latest known visit with results is:  Appointment on 07/13/2013  Component Date Value Ref Range Status  . Hemoglobin A1C 07/13/2013 9.2* 4.6 - 6.5 % Final   Glycemic Control Guidelines for People with Diabetes:Non Diabetic:  <6%Goal of Therapy: <7%Additional Action Suggested:  >8%   . Sodium 07/13/2013 139  135 - 145 mEq/L Final  . Potassium 07/13/2013 3.9  3.5 - 5.1 mEq/L Final  . Chloride 07/13/2013 105  96 - 112 mEq/L Final  . CO2 07/13/2013 32  19 - 32 mEq/L Final  . Glucose, Bld 07/13/2013 143* 70 - 99 mg/dL Final  . BUN 07/13/2013 16  6 - 23 mg/dL Final  . Creatinine, Ser 07/13/2013 1.1  0.4 - 1.5 mg/dL Final  . Total Bilirubin 07/13/2013 0.9  0.3 - 1.2 mg/dL Final  . Alkaline Phosphatase 07/13/2013 110  39 - 117 U/L Final  . AST 07/13/2013 18  0 -  37 U/L Final  . ALT 07/13/2013 14  0 - 53 U/L Final  . Total Protein 07/13/2013 6.9  6.0 - 8.3 g/dL Final  . Albumin 07/13/2013 3.5  3.5 - 5.2 g/dL Final  . Calcium 07/13/2013 9.3  8.4 - 10.5 mg/dL Final  . GFR 07/13/2013 82.71  >60.00 mL/min Final  . Fructosamine 07/13/2013 338* <285 umol/L Final   Comment:                            Variations in levels of serum proteins (albumin and immunoglobulins)                          may affect fructosamine results.                             . Cholesterol 07/13/2013 136  0 - 200 mg/dL Final   ATP III Classification       Desirable:  < 200 mg/dL               Borderline High:  200 - 239 mg/dL          High:  > = 240 mg/dL  . Triglycerides 07/13/2013 49.0  0.0 - 149.0 mg/dL Final   Normal:  <150 mg/dLBorderline High:  150 - 199 mg/dL  . HDL 07/13/2013 52.60  >39.00 mg/dL Final  . VLDL 07/13/2013 9.8  0.0 - 40.0 mg/dL Final  . LDL Cholesterol 07/13/2013 74  0 - 99 mg/dL Final  . Total CHOL/HDL Ratio 07/13/2013 3   Final                  Men           Women1/2 Average Risk     3.4          3.3Average Risk          5.0          4.42X Average Risk          9.6          7.13X Average Risk          15.0          11.0                      . Microalb, Ur 07/13/2013 59.0* 0.0 - 1.9 mg/dL Final  . Creatinine,U 07/13/2013 93.9   Final  . Microalb Creat Ratio 07/13/2013 62.9* 0.0 - 30.0 mg/g Final  . WBC 07/13/2013 6.6  4.5 - 10.5 K/uL Final  . RBC 07/13/2013 4.51  4.22 - 5.81 Mil/uL Final  . Platelets 07/13/2013 167.0  150.0 - 400.0 K/uL Final  . Hemoglobin 07/13/2013 14.0  13.0 - 17.0 g/dL Final  . HCT 07/13/2013 43.0  39.0 - 52.0 % Final  . MCV 07/13/2013 95.3  78.0 - 100.0 fl Final  . MCHC 07/13/2013 32.7  30.0 - 36.0 g/dL Final  . RDW 07/13/2013 15.0* 11.5 - 14.6 % Final        Medication List       This list is accurate as of: 08/17/13 10:45 AM.  Always use your most recent med list.               aspirin 81 MG chewable tablet  Chew 81 mg by mouth daily.     atorvastatin  40 MG tablet  Commonly known as:  LIPITOR  Take 1 tablet (40 mg total) by mouth daily.     calcium-vitamin D 500-200 MG-UNIT per tablet  Commonly known as:  OSCAL WITH D  Take 1 tablet by mouth daily.     carvedilol 12.5 MG tablet  Commonly known as:  COREG  Take 12.5 mg by mouth 2 (two) times daily with a meal.     cholecalciferol 1000 UNITS tablet  Commonly known as:  VITAMIN D  Take 1,000 Units by mouth daily.     clotrimazole-betamethasone cream  Commonly known as:  LOTRISONE     cyanocobalamin 500 MCG tablet  Take 500 mcg by mouth daily.     gabapentin 300 MG capsule  Commonly known as:  NEURONTIN     glucose blood test strip  Commonly known as:  ONETOUCH VERIO  Use as instructed to check blood sugars 3 times a day dx code 250.02     hydrochlorothiazide 12.5 MG capsule  Commonly known as:  MICROZIDE  Take 1 capsule (12.5 mg total) by mouth daily.     insulin aspart 100 UNIT/ML injection  Commonly known as:  novoLOG  Inject 5 Units  into the skin 3 (three) times daily with meals.     insulin glargine 100 UNIT/ML injection  Commonly known as:  LANTUS  Inject 0.3 mLs (30 Units total) into the skin every evening.     irbesartan 300 MG tablet  Commonly known as:  AVAPRO  Take 1 tablet (300 mg total) by mouth daily.     metFORMIN 500 MG 24 hr tablet  Commonly known as:  GLUCOPHAGE-XR  Take 2 tablets (1,000 mg total) by mouth daily with supper.     ONETOUCH DELICA LANCETS FINE Misc  Use to check blood sugars 3 times per day     polyethylene glycol packet  Commonly known as:  MIRALAX / GLYCOLAX     sildenafil 100 MG tablet  Commonly known as:  VIAGRA  Take 1 tablet (100 mg total) by mouth daily as needed for erectile dysfunction.        Allergies: No Known Allergies  Past Medical History  Diagnosis Date  . Diabetes mellitus   . ED (erectile dysfunction)   . Hypertension   . Hyperlipidemia   . Pneumonia     hx of  . CHF (congestive heart failure)   . Urination frequency   . Cancer     hx of prostate ca  . Neuromuscular disorder     numbness in hand/cervical issues  . Cataracts, bilateral     hx of  . Syncope     hx of    Past Surgical History  Procedure Laterality Date  . Hernia repair    . Tonsillectomy    . Eye surgery      cataract surgery bilateral  . Small intestine surgery      hx of  . Prostate surgery      s/p ca  . Posterior cervical fusion/foraminotomy  11/18/2011    Procedure: POSTERIOR CERVICAL FUSION/FORAMINOTOMY LEVEL 1;  Surgeon: Eustace Moore, MD;  Location: [facility];  Service: Neurosurgery;  Laterality: Bilateral;    No family history on file.  Social History:  reports that he has never smoked. He has never used smokeless tobacco. He reports that he drinks about 1.2 ounces of alcohol per week. He reports that he does not use illicit drugs.  Review of Systems:  HYPERTENSION:  this has been well controlled now, also occasionally checks at home with readings  111/67 Currently on Coreg And HCTZ  He has had transient anemia in the past, continues to take B12 supplements   Hyperlipidemia: Well controlled  with Lipitor 40 mg  Lab Results  Component Value Date   CHOL 136 07/13/2013   HDL 52.60 07/13/2013   LDLCALC 74 07/13/2013   TRIG 49.0 07/13/2013   CHOLHDL 3 07/13/2013     Foot exam done on 04/16/13   Examination:   BP 118/62  Pulse 75  Temp(Src) 98.5 F (36.9 C)  Resp 16  Ht 5\' 9"  (1.753 m)  Wt 215 lb (97.523 kg)  BMI 31.74 kg/m2  SpO2 95%  Body mass index is 31.74 kg/(m^2).    No pedal edema present  ASSESSMENT/ PLAN::   Diabetes type 2   The patient's diabetes control appears to be improving slightly with adding metformin and increasing his insulin. Overall blood sugars are still high but appear to be better in the last week Discussed needing to follow instructions for insulin and these were reviewed in detail. He'll increase his Lantus by 2 units as also suppertime NovoLog  HYPERTENSION: Excellent control and if the blood pressure being low normal will stop his HCTZ   Elayne Snare 08/17/2013, 10:45 AM

## 2013-08-17 NOTE — Patient Instructions (Addendum)
STOP hydrochlorothiazide   Novolog take 7 units before supper, continue 5 minutes before breakfast and lunch LANTUS 32 UNITS

## 2013-08-19 ENCOUNTER — Encounter: Payer: Self-pay | Admitting: *Deleted

## 2013-08-20 ENCOUNTER — Telehealth: Payer: Self-pay | Admitting: *Deleted

## 2013-08-20 NOTE — Telephone Encounter (Signed)
Patients daughter is requesting a letter so the patient can get a home health aide to come to his home and help him.

## 2013-08-20 NOTE — Telephone Encounter (Signed)
We do not do any letters. Need to have something faxed from the agency

## 2013-08-21 NOTE — Telephone Encounter (Signed)
Left message on patient's voicemail.

## 2013-08-23 ENCOUNTER — Telehealth: Payer: Self-pay | Admitting: *Deleted

## 2013-08-23 NOTE — Telephone Encounter (Signed)
Patients daughter says she needs Dr. Dwyane Dee to fax something to [facility] that assisting is need before she can even go to an agency. Please advise

## 2013-08-24 NOTE — Telephone Encounter (Signed)
Not clear what assistance he needs. We can ask [facility] if they will go to assess him

## 2013-09-05 ENCOUNTER — Telehealth: Payer: Self-pay | Admitting: *Deleted

## 2013-09-05 ENCOUNTER — Other Ambulatory Visit: Payer: Self-pay | Admitting: *Deleted

## 2013-09-05 MED ORDER — SILDENAFIL CITRATE 100 MG PO TABS: 100.0000 mg | ORAL_TABLET | Freq: Every day | ORAL | Status: DC | PRN

## 2013-09-05 NOTE — Telephone Encounter (Signed)
rx sent

## 2013-09-24 ENCOUNTER — Telehealth: Payer: Self-pay | Admitting: Endocrinology

## 2013-09-24 ENCOUNTER — Telehealth: Payer: Self-pay | Admitting: *Deleted

## 2013-09-24 NOTE — Telephone Encounter (Signed)
Increase Lantus to 34 units instead of 30. He can see me tomorrow for the rash unless he wants to see a skin doctor

## 2013-09-24 NOTE — Telephone Encounter (Signed)
Patient is concerned about his sugars, he said yesterday his sugar went up to 208, this morning it was 195, he also states he has what looks like a rash on both legs and he's very concerned about that.

## 2013-09-24 NOTE — Telephone Encounter (Signed)
Noted, patient is aware. 

## 2013-09-24 NOTE — Telephone Encounter (Signed)
Pt's sugars are constantly around the high 190 to 200s will not come down pt also has red spot on both legs

## 2013-09-25 ENCOUNTER — Encounter: Payer: Self-pay | Admitting: Endocrinology

## 2013-09-25 ENCOUNTER — Ambulatory Visit (INDEPENDENT_AMBULATORY_CARE_PROVIDER_SITE_OTHER): Payer: Medicare Other | Admitting: Endocrinology

## 2013-09-25 VITALS — BP 124/78 | HR 60 | Temp 98.1°F | Resp 16 | Ht 69.0 in | Wt 216.2 lb

## 2013-09-25 DIAGNOSIS — E119 Type 2 diabetes mellitus without complications: Secondary | ICD-10-CM

## 2013-09-25 DIAGNOSIS — I10 Essential (primary) hypertension: Secondary | ICD-10-CM

## 2013-09-25 DIAGNOSIS — B372 Candidiasis of skin and nail: Secondary | ICD-10-CM | POA: Diagnosis not present

## 2013-09-25 MED ORDER — METFORMIN HCL ER 500 MG PO TB24: 1500.0000 mg | ORAL_TABLET | Freq: Every day | ORAL | Status: DC

## 2013-09-25 MED ORDER — KETOCONAZOLE 2 % EX CREA: 1.0000 "application " | TOPICAL_CREAM | Freq: Every day | CUTANEOUS | Status: DC

## 2013-09-25 NOTE — Progress Notes (Signed)
Patient ID: [name], male   DOB: [birth year], 80 y.o.   MRN: [id number]   Reason for Appointment: Diabetes follow-up   History of Present Illness   Diagnosis: Type 2 DIABETES MELITUS, long-standing     He has been on basal bolus regimen of insulin for several years with usually fair control His A1c generally is higher than expected for his home blood sugars Over the last couple of years because of weight loss he had required much less mealtime insulin  Recent history: He was concerned about his blood sugars being higher for the last week or so. He does not think he has changed his diet or activity level and his weight is about the same Blood sugars are mostly higher recently in the morning and somewhat variably so around supper time With increasing his Lantus to 34 last night as directed his fasting reading is 167 today Does not checked many readings after meals except some after supper  Oral hypoglycemic drugs:   metformin ER 1g          Side effects from medications: 2g Insulin regimen: NovoLog 5 units a.c., Lantus 30 units at bedtime           Proper timing of medications in relation to meals: Yes.         Monitors blood glucose: Once a day.    Glucometer:  Accu-Chek         Blood Glucose readings from meter download:   PREMEAL Breakfast Lunch Dinner  8 PM  Overall  Glucose range:  62-276   112-305    72-257    Median:  184   200    200   196    Hypoglycemia: No recent lower readings except on 09/15/13 with glucose of 62 at 7 AM         Meals: 3 meals per day.          Physical activity:  walking 15-20 min on most days      Eye exam last  12/14   Wt Readings from Last 3 Encounters:  09/25/13 216 lb 3.2 oz (98.068 kg)  08/17/13 215 lb (97.523 kg)  07/20/13 223 lb 9.6 oz (101.424 kg)   Lab Results  Component Value Date   HGBA1C 9.2* 07/13/2013   HGBA1C 8.6* 04/11/2013   HGBA1C 8.8* 01/15/2013   Lab Results  Component Value Date   MICROALBUR 59.0* 07/13/2013   LDLCALC 74  07/13/2013   CREATININE 1.1 07/13/2013         Medication List       This list is accurate as of: 09/25/13  9:36 AM.  Always use your most recent med list.               aspirin 81 MG chewable tablet  Chew 81 mg by mouth daily.     atorvastatin 40 MG tablet  Commonly known as:  LIPITOR  Take 1 tablet (40 mg total) by mouth daily.     calcium-vitamin D 500-200 MG-UNIT per tablet  Commonly known as:  OSCAL WITH D  Take 1 tablet by mouth daily.     carvedilol 12.5 MG tablet  Commonly known as:  COREG  Take 12.5 mg by mouth 2 (two) times daily with a meal.     cholecalciferol 1000 UNITS tablet  Commonly known as:  VITAMIN D  Take 1,000 Units by mouth daily.     clotrimazole-betamethasone cream  Commonly known as:  LOTRISONE  cyanocobalamin 500 MCG tablet  Take 500 mcg by mouth daily.     gabapentin 300 MG capsule  Commonly known as:  NEURONTIN  300 mg.     glucose blood test strip  Commonly known as:  ONETOUCH VERIO  Use as instructed to check blood sugars 3 times a day dx code 250.02     hydrochlorothiazide 12.5 MG capsule  Commonly known as:  MICROZIDE  Take 1 capsule (12.5 mg total) by mouth daily.     insulin aspart 100 UNIT/ML injection  Commonly known as:  novoLOG  Inject 5 Units into the skin 3 (three) times daily with meals.     insulin glargine 100 UNIT/ML injection  Commonly known as:  LANTUS  Inject 34 Units into the skin every evening.     irbesartan 300 MG tablet  Commonly known as:  AVAPRO  Take 1 tablet (300 mg total) by mouth daily.     metFORMIN 500 MG 24 hr tablet  Commonly known as:  GLUCOPHAGE-XR  Take 2 tablets (1,000 mg total) by mouth daily with supper.     ONETOUCH DELICA LANCETS FINE Misc  Use to check blood sugars 3 times per day     polyethylene glycol packet  Commonly known as:  MIRALAX / GLYCOLAX     sildenafil 100 MG tablet  Commonly known as:  VIAGRA  Take 1 tablet (100 mg total) by mouth daily as needed for  erectile dysfunction.        Allergies: No Known Allergies  Past Medical History  Diagnosis Date  . Diabetes mellitus   . ED (erectile dysfunction)   . Hypertension   . Hyperlipidemia   . Pneumonia     hx of  . CHF (congestive heart failure)   . Urination frequency   . Cancer     hx of prostate ca  . Neuromuscular disorder     numbness in hand/cervical issues  . Cataracts, bilateral     hx of  . Syncope     hx of    Past Surgical History  Procedure Laterality Date  . Hernia repair    . Tonsillectomy    . Eye surgery      cataract surgery bilateral  . Small intestine surgery      hx of  . Prostate surgery      s/p ca  . Posterior cervical fusion/foraminotomy  11/18/2011    Procedure: POSTERIOR CERVICAL FUSION/FORAMINOTOMY LEVEL 1;  Surgeon: Eustace Moore, MD;  Location: [facility];  Service: Neurosurgery;  Laterality: Bilateral;    No family history on file.  Social History:  reports that he has never smoked. He has never used smokeless tobacco. He reports that he drinks about 1.2 ounces of alcohol per week. He reports that he does not use illicit drugs.  Review of Systems:  He is asking about a persistent rash in his groins especially the left side. This was treated by his urologist with a Lotrisone cream and is also using hydrocortisone. He does not think he has any itching at this time although rash may be a little better  He is asking about his lower legs feeling a little tight and asking about possible swelling No recent shortness of breath  HYPERTENSION:  this has been well controlled now, also occasionally checks at home Currently on Coreg And HCTZ  He has had transient anemia in the past, continues to take B12 supplements   Hyperlipidemia: Well controlled  with Lipitor 40  mg  Lab Results  Component Value Date   CHOL 136 07/13/2013   HDL 52.60 07/13/2013   LDLCALC 74 07/13/2013   TRIG 49.0 07/13/2013   CHOLHDL 3 07/13/2013     Foot exam done on  04/16/13   Examination:   BP 124/78  Pulse 60  Temp(Src) 98.1 F (36.7 C)  Resp 16  Ht 5\' 9"  (1.753 m)  Wt 216 lb 3.2 oz (98.068 kg)  BMI 31.91 kg/m2  SpO2 97%  Body mass index is 31.91 kg/(m^2).    He has a rosy red rash on his  left upper inner thigh near the groin which is macular and irregular. No papules. Has minimal rash on the right side No pedal edema present  ASSESSMENT/ PLAN::   Diabetes type 2   The patient's blood sugars are much higher in the last week or so for unknown reasons. Blood sugars are consistently high before breakfast and lunch and he will benefit from increased Lantus insulin for now Since he has only a few readings after supper which are inconsistent will need him to check more readings after breakfast and lunch also to help on postprandial insulin adjustment Meanwhile will increase his NovoLog at breakfast and lunch and reduce it by 2 units at supper time and he is eating a smaller meal Discussed blood sugar targets, when to check his blood sugars and call if blood sugars are not improved Also to increase his metformin to 1500 mg a day since her renal function is normal  Rash on inner thigh: Most likely this is candidiasis and he is not responding to Lotrimin partly because he is using a lot of steroid creams also Will give him a trial of Nizoral cream, starting with twice a day for the first week  Hypertension: Appears well controlled and since he has no edema or or to stasis with continue same medications   Elayne Snare 09/25/2013, 9:36 AM

## 2013-09-25 NOTE — Patient Instructions (Addendum)
NovoLog 6 units at breakfast and lunch and 4 at supper.  Lantus 34 units at bedtime  Some sugars in between meals, 2-3 hrs after meals  Apply new cream 2x daily for 1 week then 1x daily

## 2013-10-01 ENCOUNTER — Other Ambulatory Visit: Payer: Self-pay | Admitting: Endocrinology

## 2013-10-17 ENCOUNTER — Other Ambulatory Visit (INDEPENDENT_AMBULATORY_CARE_PROVIDER_SITE_OTHER): Payer: Medicare Other

## 2013-10-17 DIAGNOSIS — IMO0001 Reserved for inherently not codable concepts without codable children: Secondary | ICD-10-CM | POA: Diagnosis not present

## 2013-10-17 DIAGNOSIS — D510 Vitamin B12 deficiency anemia due to intrinsic factor deficiency: Secondary | ICD-10-CM

## 2013-10-17 DIAGNOSIS — E1165 Type 2 diabetes mellitus with hyperglycemia: Principal | ICD-10-CM

## 2013-10-17 LAB — COMPREHENSIVE METABOLIC PANEL
ALBUMIN: 3.4 g/dL — AB (ref 3.5–5.2)
ALK PHOS: 99 U/L (ref 39–117)
ALT: 15 U/L (ref 0–53)
AST: 20 U/L (ref 0–37)
BUN: 18 mg/dL (ref 6–23)
CALCIUM: 9.2 mg/dL (ref 8.4–10.5)
CO2: 28 mEq/L (ref 19–32)
Chloride: 107 mEq/L (ref 96–112)
Creatinine, Ser: 1 mg/dL (ref 0.4–1.5)
GFR: 88.18 mL/min (ref >60.00)
Glucose, Bld: 109 mg/dL — ABNORMAL HIGH (ref 70–99)
POTASSIUM: 4 meq/L (ref 3.5–5.1)
Sodium: 140 mEq/L (ref 135–145)
Total Bilirubin: 0.7 mg/dL (ref 0.2–1.2)
Total Protein: 6.2 g/dL (ref 6.0–8.3)

## 2013-10-17 LAB — CBC
HEMATOCRIT: 39.8 % (ref 39.0–52.0)
Hemoglobin: 13 g/dL (ref 13.0–17.0)
MCHC: 32.6 g/dL (ref 30.0–36.0)
MCV: 96.6 fl (ref 78.0–100.0)
Platelets: 175 10*3/uL (ref 150.0–400.0)
RBC: 4.12 Mil/uL — ABNORMAL LOW (ref 4.22–5.81)
RDW: 15.4 % (ref 11.5–15.5)
WBC: 6.1 10*3/uL (ref 4.0–10.5)

## 2013-10-17 LAB — HEMOGLOBIN A1C: HEMOGLOBIN A1C: 8.9 % — AB (ref 4.6–6.5)

## 2013-10-18 ENCOUNTER — Other Ambulatory Visit: Payer: Self-pay | Admitting: *Deleted

## 2013-10-18 MED ORDER — GABAPENTIN 300 MG PO CAPS: 300.0000 mg | ORAL_CAPSULE | Freq: Three times a day (TID) | ORAL | Status: DC

## 2013-10-22 ENCOUNTER — Encounter: Payer: Self-pay | Admitting: Endocrinology

## 2013-10-22 ENCOUNTER — Ambulatory Visit (INDEPENDENT_AMBULATORY_CARE_PROVIDER_SITE_OTHER): Payer: Medicare Other | Admitting: Endocrinology

## 2013-10-22 VITALS — BP 140/72 | HR 77 | Temp 97.9°F | Resp 16 | Ht 69.0 in | Wt 219.2 lb

## 2013-10-22 DIAGNOSIS — E1149 Type 2 diabetes mellitus with other diabetic neurological complication: Secondary | ICD-10-CM | POA: Diagnosis not present

## 2013-10-22 DIAGNOSIS — E785 Hyperlipidemia, unspecified: Secondary | ICD-10-CM

## 2013-10-22 DIAGNOSIS — I10 Essential (primary) hypertension: Secondary | ICD-10-CM

## 2013-10-22 MED ORDER — KETOCONAZOLE 2 % EX CREA: 1.0000 "application " | TOPICAL_CREAM | Freq: Every day | CUTANEOUS | Status: DC

## 2013-10-22 NOTE — Patient Instructions (Addendum)
Gabapentin only if feet hurt or sting    More sugars after lunch and doinner  Metformin 1 twice daily  Lantus Inject 36 Units into the skin every evening

## 2013-10-22 NOTE — Progress Notes (Signed)
Patient ID: [name], male   DOB: 02/08/1931, 80 y.o.   MRN: [id number]   Reason for Appointment: Diabetes follow-up   History of Present Illness   Diagnosis: Type 2 DIABETES MELITUS, long-standing     He has been on basal bolus regimen of insulin for several years with usually fair control His A1c generally is higher than expected for his home blood sugars Over the last couple of years because of weight loss he had required much less mealtime insulin  Recent history: His blood sugars have been overall poorly controlled over the last few months Because of higher fasting readings he was supposed to increase his Lantus to 34 in 5/15 but he is somewhat unclear whether he is taking this dose. Still has some fasting reading as high as 230 Also was having sporadic high readings later in the day and was asked to increase her NovoLog by 2 units at breakfast and lunch However he could not increase the metformin to 3 tablets a day because of diarrhea and he is taking only one tablet now Although he thinks he is doing a little walking around the house his daughter thinks that he is not doing much Does not check his glucose readings after meals regularly except after lunch Also appears to have relatively higher readings around 5-6 PM  Oral hypoglycemic drugs:   metformin ER 0.5g          Side effects from medications: 1.5g Insulin regimen: NovoLog 5-7 units a.c., Lantus 30 units at bedtime           Proper timing of medications in relation to meals: Yes.         Monitors blood glucose: Once a day.    Glucometer:  Accu-Chek         Blood Glucose readings from meter download:   PREMEAL Breakfast Lunch  3 PM   6-8 PM  Overall  Glucose range:  85-230   124   62-306   118-251    Mean/median:  175     210   164    Hypoglycemia: Lowest reading 62 at 3:30 PM         Meals: 3 meals per day.          Physical activity: not walking    Eye exam last  12/14   Wt Readings from Last 3 Encounters:   10/22/13 219 lb 3.2 oz (99.428 kg)  09/25/13 216 lb 3.2 oz (98.068 kg)  08/17/13 215 lb (97.523 kg)   Lab Results  Component Value Date   HGBA1C 8.9* 10/17/2013   HGBA1C 9.2* 07/13/2013   HGBA1C 8.6* 04/11/2013   Lab Results  Component Value Date   MICROALBUR 59.0* 07/13/2013   LDLCALC 74 07/13/2013   CREATININE 1.0 10/17/2013         Medication List       This list is accurate as of: 10/22/13 10:31 AM.  Always use your most recent med list.               aspirin 81 MG chewable tablet  Chew 81 mg by mouth daily.     atorvastatin 40 MG tablet  Commonly known as:  LIPITOR  Take 1 tablet (40 mg total) by mouth daily.     calcium-vitamin D 500-200 MG-UNIT per tablet  Commonly known as:  OSCAL WITH D  Take 1 tablet by mouth daily.     carvedilol 12.5 MG tablet  Commonly known as:  COREG  Take 12.5 mg by mouth 2 (two) times daily with a meal.     cholecalciferol 1000 UNITS tablet  Commonly known as:  VITAMIN D  Take 1,000 Units by mouth daily.     clotrimazole-betamethasone cream  Commonly known as:  LOTRISONE     cyanocobalamin 500 MCG tablet  Take 500 mcg by mouth daily.     gabapentin 300 MG capsule  Commonly known as:  NEURONTIN  Take 1 capsule (300 mg total) by mouth 3 (three) times daily.     glucose blood test strip  Commonly known as:  ONETOUCH VERIO  Use as instructed to check blood sugars 3 times a day dx code 250.02     hydrochlorothiazide 12.5 MG capsule  Commonly known as:  MICROZIDE  Take 1 capsule (12.5 mg total) by mouth daily.     insulin aspart 100 UNIT/ML injection  Commonly known as:  novoLOG  Inject 5 Units into the skin 3 (three) times daily with meals.     insulin glargine 100 UNIT/ML injection  Commonly known as:  LANTUS  Inject 34 Units into the skin every evening.     irbesartan 300 MG tablet  Commonly known as:  AVAPRO  Take 1 tablet (300 mg total) by mouth daily.     ketoconazole 2 % cream  Commonly known as:  NIZORAL   Apply 1 application topically daily.     metFORMIN 500 MG 24 hr tablet  Commonly known as:  GLUCOPHAGE-XR  Take 3 tablets (1,500 mg total) by mouth daily with supper.     ONETOUCH DELICA LANCETS 84O Misc  USE TO CHECK BLOOD SUGARS 3 TIMES PER DAY     polyethylene glycol packet  Commonly known as:  MIRALAX / GLYCOLAX     sildenafil 100 MG tablet  Commonly known as:  VIAGRA  Take 1 tablet (100 mg total) by mouth daily as needed for erectile dysfunction.        Allergies: No Known Allergies  Past Medical History  Diagnosis Date  . Diabetes mellitus   . ED (erectile dysfunction)   . Hypertension   . Hyperlipidemia   . Pneumonia     hx of  . CHF (congestive heart failure)   . Urination frequency   . Cancer     hx of prostate ca  . Neuromuscular disorder     numbness in hand/cervical issues  . Cataracts, bilateral     hx of  . Syncope     hx of    Past Surgical History  Procedure Laterality Date  . Hernia repair    . Tonsillectomy    . Eye surgery      cataract surgery bilateral  . Small intestine surgery      hx of  . Prostate surgery      s/p ca  . Posterior cervical fusion/foraminotomy  11/18/2011    Procedure: POSTERIOR CERVICAL FUSION/FORAMINOTOMY LEVEL 1;  Surgeon: Eustace Moore, MD;  Location: [facility];  Service: Neurosurgery;  Laterality: Bilateral;    No family history on file.  Social History:  reports that he has never smoked. He has never used smokeless tobacco. He reports that he drinks about 1.2 ounces of alcohol per week. He reports that he does not use illicit drugs.  Review of Systems:  He had Candida intertrigo in his groin area and he got better with Nizoral but is asking for a refill  No recent shortness of breath  HYPERTENSION:  this has  been well controlled now, also occasionally checks at home. Home BP 135/70 Currently on Coreg And HCTZ  He has had transient anemia in the past, continues to take B12 supplements    Hyperlipidemia: Well controlled  with Lipitor 40 mg  Lab Results  Component Value Date   CHOL 136 07/13/2013   HDL 52.60 07/13/2013   LDLCALC 74 07/13/2013   TRIG 49.0 07/13/2013   CHOLHDL 3 07/13/2013     Foot exam done on 04/16/13   Examination:   BP 140/72  Pulse 77  Temp(Src) 97.9 F (36.6 C)  Resp 16  Ht 5\' 9"  (1.753 m)  Wt 219 lb 3.2 oz (99.428 kg)  BMI 32.36 kg/m2  SpO2 96%  Body mass index is 32.36 kg/(m^2).    No pedal edema present  ASSESSMENT/ PLAN::   Diabetes type 2   The patient's blood sugars are somewhat better with increasing his insulin but not clear if he is following the instructions for the doses especially NovoLog Still having some high readings around 6 PM He does not think he can tolerate maximum dose of metformin and will try to do 500 mg twice a day Also encouraged him to be more active with walking  Hypertension: Appears well controlled and since he has no edema or orthostasis with continue same medications   [name] 10/22/2013, 10:31 AM

## 2013-10-26 ENCOUNTER — Ambulatory Visit (INDEPENDENT_AMBULATORY_CARE_PROVIDER_SITE_OTHER): Payer: Medicare Other

## 2013-10-26 VITALS — BP 154/73 | HR 83 | Resp 16

## 2013-10-26 DIAGNOSIS — E1142 Type 2 diabetes mellitus with diabetic polyneuropathy: Secondary | ICD-10-CM | POA: Diagnosis not present

## 2013-10-26 DIAGNOSIS — E1149 Type 2 diabetes mellitus with other diabetic neurological complication: Secondary | ICD-10-CM | POA: Diagnosis not present

## 2013-10-26 DIAGNOSIS — L03039 Cellulitis of unspecified toe: Secondary | ICD-10-CM | POA: Diagnosis not present

## 2013-10-26 DIAGNOSIS — L608 Other nail disorders: Secondary | ICD-10-CM

## 2013-10-26 DIAGNOSIS — E1140 Type 2 diabetes mellitus with diabetic neuropathy, unspecified: Secondary | ICD-10-CM

## 2013-10-26 DIAGNOSIS — S90121S Contusion of right lesser toe(s) without damage to nail, sequela: Secondary | ICD-10-CM

## 2013-10-26 MED ORDER — CEPHALEXIN 500 MG PO CAPS: 500.0000 mg | ORAL_CAPSULE | Freq: Three times a day (TID) | ORAL | Status: DC

## 2013-10-26 NOTE — Progress Notes (Signed)
   Subjective:    Patient ID: [name], male    DOB: Jun 28, 1930, 80 y.o.   MRN: [id number]  HPI Comments: "Need my toenails cut"  patient presents for nail care however also there is a area of the medial nail fold of the right great toe showing hemorrhage in the nail fold and what appears to be a contusion of the medial nail fold possible early paronychia. There is no discharge or drainage however large hematoma the medial nail fold is noted    Review of Systems many systemic findings or changes noted     Objective:   Physical Exam 80 year old [place] American male well-developed well-nourished oriented x3 presents this time for followup care and identified at this time with a new contusion possible paronychia right great toe as well.  Lower extremity objective findings unchanged pedal pulses are palpable DP +2/4 PT one over 4 bilateral capillary refill time 3 seconds all digits decreased sensation Semmes Weinstein to the toes forefoot patient was unaware he actually a contusion of toe no pain or discomfort on palpation of the area dermatologically skin color pigment normal hair growth absent again pigment keratotic hemorrhagic keratoses of the medial nail fold is noted on the right great toe possibly a blister or contusion patient does not recommend anything over has neuropathy. The remaining nails also show thickening discoloration dystrophy friability and patient presents radially every 3 months for palliative nail care to be undertaken at this time no other new changes no open wounds ulcerations otherwise noted       Assessment & Plan:  Assessment this time #1 diabetes with peripheral neuropathy dystrophic friable gratified nails debrided x10 the presence of diabetes and complications patient will return in 3 months for continued diabetic foot and palliative nail care  Assessment #2 is contusion of right hallux with subsequent paronychia or hematoma medial nail fold. The medial nail  fold is debrided and area of ulceration down to dermal level only is identified a medial nail fold this appears to be either a hematoma or early paronychia which was debrided without the need for anesthesia to neuropathy. The area is debrided cleansed and patient placed empirically average for cephalexin 500 mg 3 times a day x10 days was given instructions for soaking antibacterial soap and water apply Neosporin Band-Aid dressings daily until resolved recheck of the wound site in 3-3 weeks for reevaluation followup in 3 months for continued palliative care is needed  Harriet Masson DPM

## 2013-10-26 NOTE — Patient Instructions (Addendum)
ANTIBACTERIAL SOAP INSTRUCTIONS  THE DAY AFTER PROCEDURE  Please follow the instructions your doctor has marked.   Shower as usual. Before getting out, place a drop of antibacterial liquid soap (Dial) on a wet, clean washcloth.  Gently wipe washcloth over affected area.  Afterward, rinse the area with warm water.  Blot the area dry with a soft cloth and cover with antibiotic ointment (neosporin, polysporin, bacitracin) and band aid or gauze and tape  Place 3-4 drops of antibacterial liquid soap in a quart of warm tap water.  Submerge foot into water for 20 minutes.  If bandage was applied after your procedure, leave on to allow for easy lift off, then remove and continue with soak for the remaining time.  Next, blot area dry with a soft cloth and cover with a bandage.  Apply other medications as directed by your doctor, such as cortisporin otic solution (eardrops) or neosporin antibiotic ointment  Triple antibiotic ointment or Neosporin ointment can be purchased at any drugstore or pharmacy. After applying the ointment cover with a Band-Aid Continue daily treatment and dressing change until resolved        Diabetes and Foot Care Diabetes may cause you to have problems because of poor blood supply (circulation) to your feet and legs. This may cause the skin on your feet to become thinner, break easier, and heal more slowly. Your skin may become dry, and the skin may peel and crack. You may also have nerve damage in your legs and feet causing decreased feeling in them. You may not notice minor injuries to your feet that could lead to infections or more serious problems. Taking care of your feet is one of the most important things you can do for yourself.  HOME CARE INSTRUCTIONS  Wear shoes at all times, even in the house. Do not go barefoot. Bare feet are easily injured.  Check your feet daily for blisters, cuts, and redness. If you cannot see the bottom of your feet, use a mirror or ask  someone for help.  Wash your feet with warm water (do not use hot water) and mild soap. Then pat your feet and the areas between your toes until they are completely dry. Do not soak your feet as this can dry your skin.  Apply a moisturizing lotion or petroleum jelly (that does not contain alcohol and is unscented) to the skin on your feet and to dry, brittle toenails. Do not apply lotion between your toes.  Trim your toenails straight across. Do not dig under them or around the cuticle. File the edges of your nails with an emery board or nail file.  Do not cut corns or calluses or try to remove them with medicine.  Wear clean socks or stockings every day. Make sure they are not too tight. Do not wear knee-high stockings since they may decrease blood flow to your legs.  Wear shoes that fit properly and have enough cushioning. To break in new shoes, wear them for just a few hours a day. This prevents you from injuring your feet. Always look in your shoes before you put them on to be sure there are no objects inside.  Do not cross your legs. This may decrease the blood flow to your feet.  If you find a minor scrape, cut, or break in the skin on your feet, keep it and the skin around it clean and dry. These areas may be cleansed with mild soap and water. Do not cleanse the  area with peroxide, alcohol, or iodine.  When you remove an adhesive bandage, be sure not to damage the skin around it.  If you have a wound, look at it several times a day to make sure it is healing.  Do not use heating pads or hot water bottles. They may burn your skin. If you have lost feeling in your feet or legs, you may not know it is happening until it is too late.  Make sure your health care Orlo Brickle performs a complete foot exam at least annually or more often if you have foot problems. Report any cuts, sores, or bruises to your health care Dewan Emond immediately. SEEK MEDICAL CARE IF:   You have an injury that is not  healing.  You have cuts or breaks in the skin.  You have an ingrown nail.  You notice redness on your legs or feet.  You feel burning or tingling in your legs or feet.  You have pain or cramps in your legs and feet.  Your legs or feet are numb.  Your feet always feel cold. SEEK IMMEDIATE MEDICAL CARE IF:   There is increasing redness, swelling, or pain in or around a wound.  There is a red line that goes up your leg.  Pus is coming from a wound.  You develop a fever or as directed by your health care Coby Antrobus.  You notice a bad smell coming from an ulcer or wound. Document Released: 04/23/2000 Document Revised: 12/27/2012 Document Reviewed: 10/03/2012 [facility] Patient Information 2015 [place], [place]. This information is not intended to replace advice given to you by your health care Calix Heinbaugh. Make sure you discuss any questions you have with your health care Mckoy Bhakta.

## 2013-11-23 ENCOUNTER — Ambulatory Visit: Payer: Medicare Other

## 2013-12-07 ENCOUNTER — Ambulatory Visit (INDEPENDENT_AMBULATORY_CARE_PROVIDER_SITE_OTHER): Payer: Medicare Other

## 2013-12-07 VITALS — BP 118/62 | HR 78 | Resp 16

## 2013-12-07 DIAGNOSIS — L03039 Cellulitis of unspecified toe: Secondary | ICD-10-CM

## 2013-12-07 DIAGNOSIS — Z09 Encounter for follow-up examination after completed treatment for conditions other than malignant neoplasm: Secondary | ICD-10-CM

## 2013-12-07 DIAGNOSIS — L608 Other nail disorders: Secondary | ICD-10-CM

## 2013-12-07 NOTE — Progress Notes (Signed)
   Subjective:    Patient ID: MAKHI MUZQUIZ, male    DOB: [birth year], 80 y.o.   MRN: [id number]  HPI Comments: "He wanted to check this toe and make sure its healing up and it is. Its much better than it was"  Follow up 1st toe right medial border  Toe Pain       Review of Systems no new findings or systemic changes noted     Objective:   Physical Exam Neurovascular status is intact and unchanged pedal pulses palpable DP +2/4 bilateral PT one over 4 bilateral the right hallux medial nail fold show some dry eschar tissue which on debridement reveals healed epithelialized medial nail fold no discharge no drainage no pain no discomfort no signs of infection completely resolves patient was soaking as instructed in applying Band-Aid as instructed no other new findings new changes no open wounds or ulcerations noted       Assessment & Plan:  Assessment status post iodine debridement of paronychia medial nail border right great toe patient does have neuropathy the presence of diabetes and complications the medial nail fold is healed following antibiotic and soaking instructions patient discharged for followup with the next one to 2 months for regular palliative care as needed  Harriet Masson DPM

## 2013-12-07 NOTE — Patient Instructions (Signed)
Diabetes and Foot Care Diabetes may cause you to have problems because of poor blood supply (circulation) to your feet and legs. This may cause the skin on your feet to become thinner, break easier, and heal more slowly. Your skin may become dry, and the skin may peel and crack. You may also have nerve damage in your legs and feet causing decreased feeling in them. You may not notice minor injuries to your feet that could lead to infections or more serious problems. Taking care of your feet is one of the most important things you can do for yourself.  HOME CARE INSTRUCTIONS  Wear shoes at all times, even in the house. Do not go barefoot. Bare feet are easily injured.  Check your feet daily for blisters, cuts, and redness. If you cannot see the bottom of your feet, use a mirror or ask someone for help.  Wash your feet with warm water (do not use hot water) and mild soap. Then pat your feet and the areas between your toes until they are completely dry. Do not soak your feet as this can dry your skin.  Apply a moisturizing lotion or petroleum jelly (that does not contain alcohol and is unscented) to the skin on your feet and to dry, brittle toenails. Do not apply lotion between your toes.  Trim your toenails straight across. Do not dig under them or around the cuticle. File the edges of your nails with an emery board or nail file.  Do not cut corns or calluses or try to remove them with medicine.  Wear clean socks or stockings every day. Make sure they are not too tight. Do not wear knee-high stockings since they may decrease blood flow to your legs.  Wear shoes that fit properly and have enough cushioning. To break in new shoes, wear them for just a few hours a day. This prevents you from injuring your feet. Always look in your shoes before you put them on to be sure there are no objects inside.  Do not cross your legs. This may decrease the blood flow to your feet.  If you find a minor scrape,  cut, or break in the skin on your feet, keep it and the skin around it clean and dry. These areas may be cleansed with mild soap and water. Do not cleanse the area with peroxide, alcohol, or iodine.  When you remove an adhesive bandage, be sure not to damage the skin around it.  If you have a wound, look at it several times a day to make sure it is healing.  Do not use heating pads or hot water bottles. They may burn your skin. If you have lost feeling in your feet or legs, you may not know it is happening until it is too late.  Make sure your health care provider performs a complete foot exam at least annually or more often if you have foot problems. Report any cuts, sores, or bruises to your health care provider immediately. SEEK MEDICAL CARE IF:   You have an injury that is not healing.  You have cuts or breaks in the skin.  You have an ingrown nail.  You notice redness on your legs or feet.  You feel burning or tingling in your legs or feet.  You have pain or cramps in your legs and feet.  Your legs or feet are numb.  Your feet always feel cold. SEEK IMMEDIATE MEDICAL CARE IF:   There is increasing redness,   swelling, or pain in or around a wound.  There is a red line that goes up your leg.  Pus is coming from a wound.  You develop a fever or as directed by your health care provider.  You notice a bad smell coming from an ulcer or wound. Document Released: 04/23/2000 Document Revised: 12/27/2012 Document Reviewed: 10/03/2012 [employer] Patient Information 2015 [employer]. This information is not intended to replace advice given to you by your health care provider. Make sure you discuss any questions you have with your health care provider.  

## 2013-12-24 ENCOUNTER — Encounter: Payer: Self-pay | Admitting: Endocrinology

## 2013-12-24 ENCOUNTER — Ambulatory Visit (INDEPENDENT_AMBULATORY_CARE_PROVIDER_SITE_OTHER): Payer: Medicare Other | Admitting: Endocrinology

## 2013-12-24 VITALS — BP 133/60 | HR 72 | Temp 98.5°F | Resp 16 | Ht 69.0 in | Wt 217.2 lb

## 2013-12-24 DIAGNOSIS — I10 Essential (primary) hypertension: Secondary | ICD-10-CM | POA: Diagnosis not present

## 2013-12-24 DIAGNOSIS — E1149 Type 2 diabetes mellitus with other diabetic neurological complication: Secondary | ICD-10-CM

## 2013-12-24 DIAGNOSIS — E785 Hyperlipidemia, unspecified: Secondary | ICD-10-CM | POA: Diagnosis not present

## 2013-12-24 LAB — BASIC METABOLIC PANEL
BUN: 12 mg/dL (ref 6–23)
CHLORIDE: 106 meq/L (ref 96–112)
CO2: 26 meq/L (ref 19–32)
CREATININE: 1.1 mg/dL (ref 0.4–1.5)
Calcium: 9.2 mg/dL (ref 8.4–10.5)
GFR: 79.28 mL/min (ref >60.00)
GLUCOSE: 107 mg/dL — AB (ref 70–99)
POTASSIUM: 4.2 meq/L (ref 3.5–5.1)
Sodium: 140 mEq/L (ref 135–145)

## 2013-12-24 LAB — URINALYSIS, ROUTINE W REFLEX MICROSCOPIC
BILIRUBIN URINE: NEGATIVE
HGB URINE DIPSTICK: NEGATIVE
Ketones, ur: NEGATIVE
Leukocytes, UA: NEGATIVE
Nitrite: NEGATIVE
RBC / HPF: NONE SEEN (ref 0–?)
Specific Gravity, Urine: 1.01 (ref 1.000–1.030)
Total Protein, Urine: NEGATIVE
URINE GLUCOSE: NEGATIVE
Urobilinogen, UA: 1 (ref 0.0–1.0)
WBC, UA: NONE SEEN (ref 0–?)
pH: 7.5 (ref 5.0–8.0)

## 2013-12-24 LAB — LIPID PANEL
CHOL/HDL RATIO: 3
Cholesterol: 124 mg/dL (ref 0–200)
HDL: 40.3 mg/dL (ref >39.00)
LDL CALC: 77 mg/dL (ref 0–99)
NONHDL: 83.7
Triglycerides: 32 mg/dL (ref 0.0–149.0)
VLDL: 6.4 mg/dL (ref 0.0–40.0)

## 2013-12-24 LAB — MICROALBUMIN / CREATININE URINE RATIO
Creatinine,U: 102.2 mg/dL
MICROALB UR: 5.6 mg/dL — AB (ref 0.0–1.9)
Microalb Creat Ratio: 5.5 mg/g (ref 0.0–30.0)

## 2013-12-24 LAB — LDL CHOLESTEROL, DIRECT: LDL DIRECT: 79 mg/dL

## 2013-12-24 NOTE — Progress Notes (Signed)
Patient ID: [name], male   DOB: [birth year], 80 y.o.   MRN: [id number]   Reason for Appointment: Diabetes follow-up   History of Present Illness   Diagnosis: Type 2 DIABETES MELITUS, long-standing     He has been on basal bolus regimen of insulin for several years with usually fair control His A1c generally is higher than expected for his home blood sugars Over the last couple of years because of weight loss he had required much less mealtime insulin  Recent history: His blood sugars have been better since his last visit in 6/15 He has benefited from increasing his insulin doses especially Lantus and also adding metformin 500 mg twice a day which he is tolerating Checking blood sugars mostly in the mornings and has sporadic high readings later in the day and also a couple of occasions in the mornings which he cannot explain Sometimes will have a relatively large meal as he did on Sunday causing hyperglycemia but occasionally with a smaller meal he has had a glucose as low as 59 A1c is pending, previously 8.9  Oral hypoglycemic drugs:   metformin ER 0.5g bid        Side effects from medications: 1.5g Insulin regimen: NovoLog 5 units a.c., Lantus 34 units at bedtime           Proper timing of medications in relation to meals: Yes.         Monitors blood glucose: Once a day.    Glucometer:  One Touch         Blood Glucose readings from meter download:   PREMEAL Breakfast Lunch Dinner Bedtime Overall  Glucose range:  90-222   95-123  153, 167     Mean/median:  117      13 0    POST-MEAL PC Breakfast PC Lunch PC Dinner  Glucose range:   57-308   145-247   Mean/median:   149     Hypoglycemia: Once after a light lunch         Meals: 3 meals per day. Lunch 1 pm, dinner 5 pm         Physical activity: walking    Eye exam last  12/14   Wt Readings from Last 3 Encounters:  12/24/13 217 lb 3.2 oz (98.521 kg)  10/22/13 219 lb 3.2 oz (99.428 kg)  09/25/13 216 lb 3.2 oz (98.068 kg)    Lab Results  Component Value Date   HGBA1C 8.9* 10/17/2013   HGBA1C 9.2* 07/13/2013   HGBA1C 8.6* 04/11/2013   Lab Results  Component Value Date   MICROALBUR 59.0* 07/13/2013   LDLCALC 74 07/13/2013   CREATININE 1.0 10/17/2013         Medication List       This list is accurate as of: 12/24/13 10:34 AM.  Always use your most recent med list.               aspirin 81 MG chewable tablet  Chew 81 mg by mouth daily.     atorvastatin 40 MG tablet  Commonly known as:  LIPITOR  Take 1 tablet (40 mg total) by mouth daily.     calcium-vitamin D 500-200 MG-UNIT per tablet  Commonly known as:  OSCAL WITH D  Take 1 tablet by mouth daily.     carvedilol 12.5 MG tablet  Commonly known as:  COREG  Take 12.5 mg by mouth 2 (two) times daily with a meal.     cephALEXin 500 MG  capsule  Commonly known as:  KEFLEX  Take 1 capsule (500 mg total) by mouth 3 (three) times daily.     cholecalciferol 1000 UNITS tablet  Commonly known as:  VITAMIN D  Take 1,000 Units by mouth daily.     cyanocobalamin 500 MCG tablet  Take 500 mcg by mouth daily.     gabapentin 300 MG capsule  Commonly known as:  NEURONTIN  Take 1 capsule (300 mg total) by mouth 3 (three) times daily.     glucose blood test strip  Commonly known as:  ONETOUCH VERIO  Use as instructed to check blood sugars 3 times a day dx code 250.02     hydrochlorothiazide 12.5 MG capsule  Commonly known as:  MICROZIDE  Take 1 capsule (12.5 mg total) by mouth daily.     insulin aspart 100 UNIT/ML injection  Commonly known as:  novoLOG  Inject 5 Units into the skin 3 (three) times daily with meals.     insulin glargine 100 UNIT/ML injection  Commonly known as:  LANTUS  Inject 34 Units into the skin every evening.     irbesartan 300 MG tablet  Commonly known as:  AVAPRO  Take 1 tablet (300 mg total) by mouth daily.     ketoconazole 2 % cream  Commonly known as:  NIZORAL  Apply 1 application topically daily.     metFORMIN  500 MG 24 hr tablet  Commonly known as:  GLUCOPHAGE-XR  Take 3 tablets (1,500 mg total) by mouth daily with supper.     ONETOUCH DELICA LANCETS 69G Misc  USE TO CHECK BLOOD SUGARS 3 TIMES PER DAY     polyethylene glycol packet  Commonly known as:  MIRALAX / GLYCOLAX     sildenafil 100 MG tablet  Commonly known as:  VIAGRA  Take 1 tablet (100 mg total) by mouth daily as needed for erectile dysfunction.        Allergies: No Known Allergies  Past Medical History  Diagnosis Date  . Diabetes mellitus   . ED (erectile dysfunction)   . Hypertension   . Hyperlipidemia   . Pneumonia     hx of  . CHF (congestive heart failure)   . Urination frequency   . Cancer     hx of prostate ca  . Neuromuscular disorder     numbness in hand/cervical issues  . Cataracts, bilateral     hx of  . Syncope     hx of    Past Surgical History  Procedure Laterality Date  . Hernia repair    . Tonsillectomy    . Eye surgery      cataract surgery bilateral  . Small intestine surgery      hx of  . Prostate surgery      s/p ca  . Posterior cervical fusion/foraminotomy  11/18/2011    Procedure: POSTERIOR CERVICAL FUSION/FORAMINOTOMY LEVEL 1;  Surgeon: Eustace Moore, MD;  Location: [facility];  Service: Neurosurgery;  Laterality: Bilateral;    No family history on file.  Social History:  reports that he has never smoked. He has never used smokeless tobacco. He reports that he drinks about 1.2 ounces of alcohol per week. He reports that he does not use illicit drugs.  Review of Systems:  He had Candida intertrigo in his groin area and he got better with Nizoral but is still using prn  No recent shortness of breath  HYPERTENSION:  this has been well controlled now, also  occasionally checks at home. Currently on Coreg And HCTZ  He has had transient anemia in the past, continues to take B12 supplements   Lab Results  Component Value Date   WBC 6.1 10/17/2013   HGB 13.0 10/17/2013   HCT  39.8 10/17/2013   MCV 96.6 10/17/2013   PLT 175.0 10/17/2013     Hyperlipidemia: Well controlled  with Lipitor 40 mg  Lab Results  Component Value Date   CHOL 136 07/13/2013   HDL 52.60 07/13/2013   LDLCALC 74 07/13/2013   TRIG 49.0 07/13/2013   CHOLHDL 3 07/13/2013     Foot exam done on 04/16/13   Examination:   BP 133/60  Pulse 72  Temp(Src) 98.5 F (36.9 C)  Resp 16  Ht 5\' 9"  (1.753 m)  Wt 217 lb 3.2 oz (98.521 kg)  BMI 32.06 kg/m2  SpO2 98%  Body mass index is 32.06 kg/(m^2).    No pedal edema present  ASSESSMENT/ PLAN:   Diabetes type 2   The patient's blood sugars are somewhat better with increasing his insulin and using metformin 500 twice a day Discussed needing to adjust the NovoLog by 2 units at mealtimes if he is eating a relatively larger or smaller meal He should check more readings after meals and less on waking up  Hypertension: Appears well controlled on current regimen  Fatigue/weakness appears to be improved and he is a little more active  Carma Dwiggins 12/24/2013, 10:34 AM

## 2013-12-24 NOTE — Patient Instructions (Signed)
Go up or down 2 units on Novolog if eating a larger or smaller meal

## 2014-01-27 DIAGNOSIS — Z23 Encounter for immunization: Secondary | ICD-10-CM | POA: Diagnosis not present

## 2014-02-01 ENCOUNTER — Ambulatory Visit: Payer: Medicare Other

## 2014-02-13 DIAGNOSIS — E559 Vitamin D deficiency, unspecified: Secondary | ICD-10-CM | POA: Diagnosis not present

## 2014-02-20 DIAGNOSIS — C61 Malignant neoplasm of prostate: Secondary | ICD-10-CM | POA: Diagnosis not present

## 2014-02-26 ENCOUNTER — Other Ambulatory Visit: Payer: Self-pay | Admitting: Endocrinology

## 2014-02-27 ENCOUNTER — Encounter: Payer: Self-pay | Admitting: *Deleted

## 2014-02-27 DIAGNOSIS — E119 Type 2 diabetes mellitus without complications: Secondary | ICD-10-CM | POA: Diagnosis not present

## 2014-02-27 LAB — HM DIABETES EYE EXAM

## 2014-02-28 NOTE — Telephone Encounter (Signed)
error 

## 2014-03-05 ENCOUNTER — Ambulatory Visit (INDEPENDENT_AMBULATORY_CARE_PROVIDER_SITE_OTHER): Payer: Medicare Other

## 2014-03-05 DIAGNOSIS — E1140 Type 2 diabetes mellitus with diabetic neuropathy, unspecified: Secondary | ICD-10-CM

## 2014-03-05 DIAGNOSIS — M79673 Pain in unspecified foot: Secondary | ICD-10-CM

## 2014-03-05 DIAGNOSIS — B351 Tinea unguium: Secondary | ICD-10-CM | POA: Diagnosis not present

## 2014-03-05 NOTE — Progress Notes (Signed)
   Subjective:    Patient ID: [name], male    DOB: [birth year], 80 y.o.   MRN: [id number]  HPI Pt presents for nail debridement    Review of Systems no new findings or systemic changes noted     Objective:   Physical Exam neurovascular status is intact pedal pulses are palpable DP +2 PT +1 over 4 Refill time 3 seconds nails thick brittle, friable dystrophic 1 through 5 bilateral debrided recheck and to 3 months for continued foot palliative nail care new changes no open wounds no ulceration of secondary infection is noted nails thick brittle acromion friable      Assessment & Plan:  Assessment diabetes I and paronychia has resolved no signs of current infection as a history of peripheral neuropathy and angiopathy nails debrided 10 return for future palliative care in 3 months or as recommended next  [employer] DPM

## 2014-03-08 ENCOUNTER — Ambulatory Visit: Payer: Medicare Other

## 2014-03-21 ENCOUNTER — Telehealth: Payer: Self-pay | Admitting: *Deleted

## 2014-03-21 ENCOUNTER — Other Ambulatory Visit (INDEPENDENT_AMBULATORY_CARE_PROVIDER_SITE_OTHER): Payer: Medicare Other

## 2014-03-21 DIAGNOSIS — E1149 Type 2 diabetes mellitus with other diabetic neurological complication: Secondary | ICD-10-CM | POA: Diagnosis not present

## 2014-03-21 DIAGNOSIS — IMO0002 Reserved for concepts with insufficient information to code with codable children: Secondary | ICD-10-CM

## 2014-03-21 DIAGNOSIS — E1165 Type 2 diabetes mellitus with hyperglycemia: Principal | ICD-10-CM

## 2014-03-21 LAB — BASIC METABOLIC PANEL
BUN: 17 mg/dL (ref 6–23)
CHLORIDE: 107 meq/L (ref 96–112)
CO2: 20 mEq/L (ref 19–32)
Calcium: 9.3 mg/dL (ref 8.4–10.5)
Creatinine, Ser: 1.3 mg/dL (ref 0.4–1.5)
GFR: 71.24 mL/min (ref >60.00)
Glucose, Bld: 117 mg/dL — ABNORMAL HIGH (ref 70–99)
POTASSIUM: 4.3 meq/L (ref 3.5–5.1)
Sodium: 140 mEq/L (ref 135–145)

## 2014-03-21 LAB — HEMOGLOBIN A1C: Hgb A1c MFr Bld: 7.4 % — ABNORMAL HIGH (ref 4.6–6.5)

## 2014-03-26 ENCOUNTER — Other Ambulatory Visit: Payer: Self-pay | Admitting: *Deleted

## 2014-03-26 ENCOUNTER — Ambulatory Visit (INDEPENDENT_AMBULATORY_CARE_PROVIDER_SITE_OTHER): Payer: Medicare Other | Admitting: Endocrinology

## 2014-03-26 ENCOUNTER — Encounter: Payer: Self-pay | Admitting: Endocrinology

## 2014-03-26 VITALS — BP 128/62 | HR 76 | Temp 98.5°F | Resp 14 | Ht 69.0 in | Wt 213.8 lb

## 2014-03-26 DIAGNOSIS — Z23 Encounter for immunization: Secondary | ICD-10-CM

## 2014-03-26 DIAGNOSIS — I10 Essential (primary) hypertension: Secondary | ICD-10-CM

## 2014-03-26 DIAGNOSIS — E1165 Type 2 diabetes mellitus with hyperglycemia: Secondary | ICD-10-CM

## 2014-03-26 DIAGNOSIS — IMO0002 Reserved for concepts with insufficient information to code with codable children: Secondary | ICD-10-CM

## 2014-03-26 MED ORDER — IRBESARTAN 300 MG PO TABS: 300.0000 mg | ORAL_TABLET | Freq: Every day | ORAL | Status: DC

## 2014-03-26 MED ORDER — ATORVASTATIN CALCIUM 40 MG PO TABS: 40.0000 mg | ORAL_TABLET | Freq: Every day | ORAL | Status: DC

## 2014-03-26 MED ORDER — GABAPENTIN 300 MG PO CAPS: 300.0000 mg | ORAL_CAPSULE | Freq: Three times a day (TID) | ORAL | Status: DC

## 2014-03-26 MED ORDER — CARVEDILOL 12.5 MG PO TABS: 12.5000 mg | ORAL_TABLET | Freq: Two times a day (BID) | ORAL | Status: DC

## 2014-03-26 NOTE — Patient Instructions (Signed)
Reduce the Irbesartan to 1/2 daily  Please check blood sugars at least half the time about 2 hours after any meal and times per week on waking up. Please bring blood sugar monitor to each visit

## 2014-03-26 NOTE — Progress Notes (Signed)
Patient ID: [name], male   DOB: February 09, 1931, 80 y.o.   MRN: [id number]   Reason for Appointment: Diabetes follow-up   History of Present Illness   Diagnosis: Type 2 DIABETES MELITUS, long-standing     He has been on basal bolus regimen of insulin for several years with usually fair control His A1c generally is higher than expected for his home blood sugars Over the last couple of years because of weight loss he had required much less mealtime insulin  Recent history: His blood sugars appear to be fairly well controlled with A1c now 7.4% and had been much higher earlier this year Blood sugars had improved with increasing his insulin doses especially Lantus and also adding metformin 500 mg twice a day which he is tolerating, does not tolerate higher doses of metformin Checking blood sugars mostly in the mornings and in the late afternoon but difficult to analyze his monitor download since he has the incorrect time programmed He does have sporadic readings over 200, rarely nearly 300 but he does not know what kind of foods cause the high readings He also has lost weight and is trying to walk more than before  Oral hypoglycemic drugs:   metformin ER 0.5g bid        Side effects from medications: diarrhea with 1.5g metformin Insulin regimen: NovoLog 5 units a.c., Lantus 34 units at bedtime           Proper timing of medications in relation to meals: Yes.          Monitors blood glucose: 2.3 times a day.    Glucometer:  One Touch         Blood Glucose readings from meter download:   Fasting glucose range 61-150 with average 113 Afternoon and early evening 72-301 with median about 155 Overall median 130  Hypoglycemia: Once only on waking up         Meals: 3 meals per day. Lunch 1 pm, dinner 5 pm         Physical activity: walking upto 1 hour   Eye exam last  12/14   Wt Readings from Last 3 Encounters:  03/26/14 213 lb 12.8 oz (96.979 kg)  12/24/13 217 lb 3.2 oz (98.521 kg)   10/22/13 219 lb 3.2 oz (99.428 kg)   Lab Results  Component Value Date   HGBA1C 7.4* 03/21/2014   HGBA1C 8.9* 10/17/2013   HGBA1C 9.2* 07/13/2013   Lab Results  Component Value Date   MICROALBUR 5.6* 12/24/2013   LDLCALC 77 12/24/2013   CREATININE 1.3 03/21/2014         Medication List       This list is accurate as of: 03/26/14  9:11 AM.  Always use your most recent med list.               aspirin 81 MG chewable tablet  Chew 81 mg by mouth daily.     atorvastatin 40 MG tablet  Commonly known as:  LIPITOR  Take 1 tablet (40 mg total) by mouth daily.     calcium-vitamin D 500-200 MG-UNIT per tablet  Commonly known as:  OSCAL WITH D  Take 1 tablet by mouth daily.     carvedilol 12.5 MG tablet  Commonly known as:  COREG  Take 1 tablet (12.5 mg total) by mouth 2 (two) times daily with a meal.     cephALEXin 500 MG capsule  Commonly known as:  KEFLEX  Take 1 capsule (500  mg total) by mouth 3 (three) times daily.     cholecalciferol 1000 UNITS tablet  Commonly known as:  VITAMIN D  Take 1,000 Units by mouth daily.     cyanocobalamin 500 MCG tablet  Take 500 mcg by mouth daily.     gabapentin 300 MG capsule  Commonly known as:  NEURONTIN  Take 1 capsule (300 mg total) by mouth 3 (three) times daily.     glucose blood test strip  Commonly known as:  ONETOUCH VERIO  Use as instructed to check blood sugars 3 times a day dx code 250.02     hydrochlorothiazide 12.5 MG capsule  Commonly known as:  MICROZIDE  Take 1 capsule (12.5 mg total) by mouth daily.     insulin aspart 100 UNIT/ML injection  Commonly known as:  novoLOG  Inject 5 Units into the skin 3 (three) times daily with meals.     insulin glargine 100 UNIT/ML injection  Commonly known as:  LANTUS  Inject 34 Units into the skin every evening.     irbesartan 300 MG tablet  Commonly known as:  AVAPRO  Take 1 tablet (300 mg total) by mouth daily.     ketoconazole 2 % cream  Commonly known as:   NIZORAL  Apply 1 application topically daily.     metFORMIN 500 MG 24 hr tablet  Commonly known as:  GLUCOPHAGE-XR  Take 3 tablets (1,500 mg total) by mouth daily with supper.     ONETOUCH DELICA LANCETS 46N Misc  USE TO CHECK BLOOD SUGARS 3 TIMES PER DAY     polyethylene glycol packet  Commonly known as:  MIRALAX / GLYCOLAX     VIAGRA 100 MG tablet  Generic drug:  sildenafil  TAKE 1 TABLET (100 MG TOTAL) BY MOUTH DAILY AS NEEDED FOR ERECTILE DYSFUNCTION.        Allergies: No Known Allergies  Past Medical History  Diagnosis Date  . Diabetes mellitus   . ED (erectile dysfunction)   . Hypertension   . Hyperlipidemia   . Pneumonia     hx of  . CHF (congestive heart failure)   . Urination frequency   . Cancer     hx of prostate ca  . Neuromuscular disorder     numbness in hand/cervical issues  . Cataracts, bilateral     hx of  . Syncope     hx of    Past Surgical History  Procedure Laterality Date  . Hernia repair    . Tonsillectomy    . Eye surgery      cataract surgery bilateral  . Small intestine surgery      hx of  . Prostate surgery      s/p ca  . Posterior cervical fusion/foraminotomy  11/18/2011    Procedure: POSTERIOR CERVICAL FUSION/FORAMINOTOMY LEVEL 1;  Surgeon: Eustace Moore, MD;  Location: [facility];  Service: Neurosurgery;  Laterality: Bilateral;    No family history on file.  Social History:  reports that he has never smoked. He has never used smokeless tobacco. He reports that he drinks about 1.2 oz of alcohol per week. He reports that he does not use illicit drugs.  Review of Systems:  No recent shortness of breath or leg edema  HYPERTENSION:  this has been well controlled now, also occasionally checks at home with good readings. Currently on Coreg, Avapro And HCTZ Does not feel lightheaded on standing up  He has had transient anemia previously, continues to take  B12 supplements as directed  Lab Results  Component Value Date   WBC  6.1 10/17/2013   HGB 13.0 10/17/2013   HCT 39.8 10/17/2013   MCV 96.6 10/17/2013   PLT 175.0 10/17/2013     Hyperlipidemia: Well controlled  with Lipitor 40 mg  Lab Results  Component Value Date   CHOL 124 12/24/2013   HDL 40.30 12/24/2013   LDLCALC 77 12/24/2013   LDLDIRECT 79.0 12/24/2013   TRIG 32.0 12/24/2013   CHOLHDL 3 12/24/2013     Foot exam done on 04/16/13   Examination:   BP 128/62 mmHg  Pulse 76  Temp(Src) 98.5 F (36.9 C)  Resp 14  Ht 5\' 9"  (1.753 m)  Wt 213 lb 12.8 oz (96.979 kg)  BMI 31.56 kg/m2  SpO2 98%  Body mass index is 31.56 kg/(m^2).    Repeat standing blood pressure 120/62 Heart rate regular  No pedal edema present  ASSESSMENT/ PLAN:   Diabetes type 2   The patient's blood sugars are overall fairly good with combination of basal and bolus insulin as well as low dose metformin He has done better with his exercise regimen and is losing some weight His glucose monitor was reprogrammed to the correct date and time Advised him to adjust his mealtime dose 1-2 units up or down based on his meal size and carbohydrate intake since he tends to have occasional low normal readings after lunch and at times readings over 200  He should check more readings after meals  Including supper For now will continue the same insulin dose  Hypertension: Appears to have relatively lower blood pressure reading today and also since creatinine is tending to be higher will reduce his Avapro to half a tablet  Preventive care: He has not had Prevnar and discussed benefits of this, this was administered  Counseling time over 50% of today's 25 minute visit  Thoms Barthelemy 03/26/2014, 9:11 AM

## 2014-03-28 ENCOUNTER — Other Ambulatory Visit: Payer: Self-pay | Admitting: *Deleted

## 2014-03-28 MED ORDER — HYDROCHLOROTHIAZIDE 12.5 MG PO CAPS: 12.5000 mg | ORAL_CAPSULE | Freq: Every day | ORAL | Status: DC

## 2014-03-29 ENCOUNTER — Other Ambulatory Visit: Payer: Self-pay

## 2014-03-29 MED ORDER — HYDROCHLOROTHIAZIDE 12.5 MG PO CAPS: 12.5000 mg | ORAL_CAPSULE | Freq: Every day | ORAL | Status: DC

## 2014-04-16 ENCOUNTER — Other Ambulatory Visit: Payer: Self-pay | Admitting: Endocrinology

## 2014-04-20 ENCOUNTER — Other Ambulatory Visit: Payer: Self-pay | Admitting: Endocrinology

## 2014-06-04 ENCOUNTER — Ambulatory Visit: Payer: Medicare Other | Admitting: Interventional Cardiology

## 2014-06-05 ENCOUNTER — Ambulatory Visit (INDEPENDENT_AMBULATORY_CARE_PROVIDER_SITE_OTHER): Payer: Medicare Other | Admitting: Interventional Cardiology

## 2014-06-05 ENCOUNTER — Encounter: Payer: Self-pay | Admitting: Interventional Cardiology

## 2014-06-05 ENCOUNTER — Other Ambulatory Visit: Payer: Self-pay

## 2014-06-05 ENCOUNTER — Telehealth: Payer: Self-pay

## 2014-06-05 VITALS — BP 130/82 | HR 77 | Ht 69.0 in | Wt 214.4 lb

## 2014-06-05 DIAGNOSIS — I5042 Chronic combined systolic (congestive) and diastolic (congestive) heart failure: Secondary | ICD-10-CM | POA: Diagnosis not present

## 2014-06-05 DIAGNOSIS — I447 Left bundle-branch block, unspecified: Secondary | ICD-10-CM

## 2014-06-05 DIAGNOSIS — I10 Essential (primary) hypertension: Secondary | ICD-10-CM | POA: Diagnosis not present

## 2014-06-05 DIAGNOSIS — I209 Angina pectoris, unspecified: Secondary | ICD-10-CM | POA: Diagnosis not present

## 2014-06-05 MED ORDER — NITROGLYCERIN 0.4 MG SL SUBL: 0.4000 mg | SUBLINGUAL_TABLET | SUBLINGUAL | Status: DC | PRN

## 2014-06-05 MED ORDER — CARVEDILOL 12.5 MG PO TABS: 12.5000 mg | ORAL_TABLET | Freq: Two times a day (BID) | ORAL | Status: DC

## 2014-06-05 MED ORDER — HYDROCHLOROTHIAZIDE 12.5 MG PO CAPS: 12.5000 mg | ORAL_CAPSULE | Freq: Every day | ORAL | Status: DC

## 2014-06-05 NOTE — Telephone Encounter (Signed)
F/u ° ° °Pt returning your call. Please call pt. °

## 2014-06-05 NOTE — Patient Instructions (Signed)
Your physician has recommended you make the following change in your medication:  1) An Rx for Nitro-glycerin has been sent to your pharmacy. Use as directed  Call the office if your are having to use Nitro frequently  Your physician wants you to follow-up in: 6 months with Dr.Smith You will receive a reminder letter in the mail two months in advance. If you don't receive a letter, please call our office to schedule the follow-up appointment.

## 2014-06-05 NOTE — Telephone Encounter (Signed)
lmtcb. pt was prscribed Nitro-Glycerin, ge also has Viagra lised on his med list, he is to call the office before he use Nitro. he is to call back to be given instruction to not take Nitro and Viagra with in 24 hours of each other.

## 2014-06-05 NOTE — Progress Notes (Signed)
Cardiology Office Note   Date:  06/05/2014   ID:  [name], DOB 07-25-1930, MRN [id number]  PCP:  Elayne Snare, MD  Cardiologist:   Sinclair Grooms, MD   Chronic systolic heart failure    History of Present Illness: [name] is a 81 y.o. male who presents for follow-up of chronic systolic heart failure. He is a known diabetic. He is followed by Dr. Dwyane Dee. Over this pain she we have adjusted his medications and gotten him out of fluid overload. He feels his breathing is doing quite well. There is no lower extremity swelling. He does complain, vaguely, of left chest discomfort. He describes it as an ache. It can occur with activity but also spontaneously. He denies associated diaphoresis and radiation. No associated dyspnea. He uses times and after about 30 minutes the discomfort goes away.    Past Medical History  Diagnosis Date  . Diabetes mellitus   . ED (erectile dysfunction)   . Hypertension   . Hyperlipidemia   . Pneumonia     hx of  . CHF (congestive heart failure)   . Urination frequency   . Cancer     hx of prostate ca  . Neuromuscular disorder     numbness in hand/cervical issues  . Cataracts, bilateral     hx of  . Syncope     hx of    Past Surgical History  Procedure Laterality Date  . Hernia repair    . Tonsillectomy    . Eye surgery      cataract surgery bilateral  . Small intestine surgery      hx of  . Prostate surgery      s/p ca  . Posterior cervical fusion/foraminotomy  11/18/2011    Procedure: POSTERIOR CERVICAL FUSION/FORAMINOTOMY LEVEL 1;  Surgeon: Eustace Moore, MD;  Location: [facility];  Service: Neurosurgery;  Laterality: Bilateral;     Current Outpatient Prescriptions  Medication Sig Dispense Refill  . aspirin 81 MG chewable tablet Chew 81 mg by mouth daily.    Marland Kitchen atorvastatin (LIPITOR) 40 MG tablet Take 1 tablet (40 mg total) by mouth daily. 90 tablet 1  . calcium-vitamin D (OSCAL WITH D) 500-200 MG-UNIT per tablet Take 1  tablet by mouth daily.    . carvedilol (COREG) 12.5 MG tablet Take 1 tablet (12.5 mg total) by mouth 2 (two) times daily with a meal. 180 tablet 1  . cephALEXin (KEFLEX) 500 MG capsule Take 1 capsule (500 mg total) by mouth 3 (three) times daily. 30 capsule 0  . cholecalciferol (VITAMIN D) 1000 UNITS tablet Take 1,000 Units by mouth daily.    . cyanocobalamin 500 MCG tablet Take 500 mcg by mouth daily.    Marland Kitchen gabapentin (NEURONTIN) 300 MG capsule Take 1 capsule (300 mg total) by mouth 3 (three) times daily. 270 capsule 1  . hydrochlorothiazide (MICROZIDE) 12.5 MG capsule Take 1 capsule (12.5 mg total) by mouth daily. 30 capsule 0  . insulin aspart (NOVOLOG) 100 UNIT/ML injection Inject 5 Units into the skin 3 (three) times daily with meals.    . insulin glargine (LANTUS) 100 UNIT/ML injection Inject 34 Units into the skin every evening.    . irbesartan (AVAPRO) 300 MG tablet Take 1 tablet (300 mg total) by mouth daily. 90 tablet 1  . ketoconazole (NIZORAL) 2 % cream Apply 1 application topically daily. 30 g 0  . metFORMIN (GLUCOPHAGE-XR) 500 MG 24 hr tablet Take 3 tablets (1,500 mg total)  by mouth daily with supper. 90 tablet 3  . ONETOUCH DELICA LANCETS 17P MISC USE TO CHECK BLOOD SUGARS 3 TIMES PER DAY 100 each 5  . ONETOUCH VERIO test strip CHECK BLOOD SUGAR 3 TIMES DAILY 300 each 1  . polyethylene glycol (MIRALAX / GLYCOLAX) packet     . VIAGRA 100 MG tablet TAKE 1 TABLET (100 MG TOTAL) BY MOUTH DAILY AS NEEDED FOR ERECTILE DYSFUNCTION. 30 tablet 1   No current facility-administered medications for this visit.    Allergies:   Review of patient's allergies indicates no known allergies.    Social History:  The patient  reports that he has never smoked. He has never used smokeless tobacco. He reports that he drinks about 1.2 oz of alcohol per week. He reports that he does not use illicit drugs.   Family History:  The patient's family history includes Diabetes in his mother; Hypertension in  his father.    ROS:  Please see the history of present illness.   Otherwise, review of systems are positive for occasional ankle swelling.   All other systems are reviewed and negative.    PHYSICAL EXAM: VS:  BP 130/82 mmHg  Pulse 77  Ht 5\' 9"  (1.753 m)  Wt 214 lb 6.4 oz (97.251 kg)  BMI 31.65 kg/m2 , BMI Body mass index is 31.65 kg/(m^2). GEN: Well nourished, well developed, in no acute distress HEENT: normal Neck: no JVD, carotid bruits, or masses Cardiac: RRR; no murmurs, rubs, or gallops,no edema  Respiratory:  clear to auscultation bilaterally, normal work of breathing GI: soft, nontender, nondistended, + BS MS: no deformity or atrophy Skin: warm and dry, no rash Neuro:  Strength and sensation are intact Psych: euthymic mood, full affect   EKG:  EKG is ordered today. The ekg ordered today demonstrates normal sinus rhythm with first-degree AV block, left bundle branch block, and PACs. When compared to the prior tracing, the PACs are new.   Recent Labs: 10/17/2013: ALT 15; Hemoglobin 13.0; Platelets 175.0 03/21/2014: BUN 17; Creatinine 1.3; Potassium 4.3; Sodium 140    Lipid Panel    Component Value Date/Time   CHOL 124 12/24/2013 1048   TRIG 32.0 12/24/2013 1048   HDL 40.30 12/24/2013 1048   CHOLHDL 3 12/24/2013 1048   VLDL 6.4 12/24/2013 1048   LDLCALC 77 12/24/2013 1048   LDLDIRECT 79.0 12/24/2013 1048      Wt Readings from Last 3 Encounters:  06/05/14 214 lb 6.4 oz (97.251 kg)  03/26/14 213 lb 12.8 oz (96.979 kg)  12/24/13 217 lb 3.2 oz (98.521 kg)      Other studies Reviewed: Additional studies/ records that were reviewed today include: Prior nuclear and echocardiogram performed at [facility] cardi[facility]. Review of the above records demonstrates: Ejection fraction of 45%. No ischemia by myocardial perfusion imaging in 2014   ASSESSMENT AND PLAN:  1.  Chronic combined systolic and diastolic heart failure without evidence of volume overload. Patient is well  compensated on his current medical regimen that includes a beta blocker and angiotensin receptor blocker. 2. Chest discomfort that could represent angina pectoris although difficult to tell at this time. Symptoms are not classical. We'll give the patient tetralogy sublingual nitroglycerin to see if it alters the course of the discomfort which usually lasts up to 30 minutes. He will call us with further data concerning response to nitroglycerin 3. Essential hypertension, controlled 4. Left bundle branch block, unchanged   Current medicines are reviewed at length with the patient today.  The patient does not have concerns regarding medicines.  The following changes have been made:  Sublingual nitroglycerin 0.4 mg as needed for chest discomfort. He should call us within the next month or so to let us here whether or not the nitroglycerin impacts the course of his episodes of chest discomfort. He should also quantitate the frequency of nitroglycerin use.  Labs/ tests ordered today include: None  No orders of the defined types were placed in this encounter.     Disposition:   FU with Linard Millers in 6 months   Signed, Sinclair Grooms, MD  06/05/2014 7:53 AM    [address], [place], [place]  [address] Phone: [phone]; Fax: [phone]

## 2014-06-05 NOTE — Telephone Encounter (Signed)
Pt aware of Dr.Smith's instructions. He is NOT to use Nitro-glycerin and Viagra with in 24 hours of each other. Pt asked that I talk with his daughter, she was given the same instruction with verbal understanding

## 2014-06-05 NOTE — Telephone Encounter (Signed)
2nd attempt to reach pt

## 2014-06-07 ENCOUNTER — Ambulatory Visit: Payer: Medicare Other | Admitting: Interventional Cardiology

## 2014-06-11 ENCOUNTER — Ambulatory Visit (INDEPENDENT_AMBULATORY_CARE_PROVIDER_SITE_OTHER): Payer: Medicare Other

## 2014-06-11 ENCOUNTER — Other Ambulatory Visit: Payer: Self-pay | Admitting: *Deleted

## 2014-06-11 VITALS — BP 134/62 | HR 79 | Resp 12

## 2014-06-11 DIAGNOSIS — B351 Tinea unguium: Secondary | ICD-10-CM | POA: Diagnosis not present

## 2014-06-11 DIAGNOSIS — E1140 Type 2 diabetes mellitus with diabetic neuropathy, unspecified: Secondary | ICD-10-CM | POA: Diagnosis not present

## 2014-06-11 DIAGNOSIS — M79673 Pain in unspecified foot: Secondary | ICD-10-CM | POA: Diagnosis not present

## 2014-06-11 LAB — HM DIABETES FOOT EXAM: HM Diabetic Foot Exam: ABNORMAL

## 2014-06-11 MED ORDER — INSULIN ASPART 100 UNIT/ML ~~LOC~~ SOLN: 5.0000 [IU] | Freq: Three times a day (TID) | SUBCUTANEOUS | Status: DC

## 2014-06-11 MED ORDER — INSULIN GLARGINE 100 UNIT/ML ~~LOC~~ SOLN: 34.0000 [IU] | Freq: Every evening | SUBCUTANEOUS | Status: DC

## 2014-06-11 NOTE — Progress Notes (Signed)
   Subjective:    Patient ID: [name], male    DOB: 10/24/1930, 81 y.o.   MRN: [id number]  HPI  ''trim my toenails.''  b/l  Review of Systems No new findings or systemic changes    Objective:   Physical Exam Neurovascular status is unchanged pedal pulses DP +2 PT 1 over 4 bilateral capillary refill timed 3-4 seconds all digits. Nails thick brittle crumbly discolored friable 1 through 5 bilateral no open wounds no ulcers no secondary infections. Mild flexible digital contractures are noted. There is decreased sensation Semmes Weinstein to the forefoot digits and arch bilateral.       Assessment & Plan:  Assessment diabetes with history of ingrowing nail thick painful mycotic dystrophic nails 1 through 5 bilateral debrided return for follow-up diabetic foot and mycotic nail care in 3 months as recommended  Harriet Masson DPM

## 2014-06-21 ENCOUNTER — Other Ambulatory Visit: Payer: Medicare Other

## 2014-06-26 ENCOUNTER — Ambulatory Visit: Payer: Medicare Other | Admitting: Endocrinology

## 2014-07-09 ENCOUNTER — Other Ambulatory Visit: Payer: Self-pay | Admitting: Interventional Cardiology

## 2014-07-16 ENCOUNTER — Other Ambulatory Visit: Payer: Medicare Other

## 2014-07-16 ENCOUNTER — Other Ambulatory Visit (INDEPENDENT_AMBULATORY_CARE_PROVIDER_SITE_OTHER): Payer: Medicare Other

## 2014-07-16 DIAGNOSIS — E1165 Type 2 diabetes mellitus with hyperglycemia: Secondary | ICD-10-CM | POA: Diagnosis not present

## 2014-07-16 DIAGNOSIS — IMO0002 Reserved for concepts with insufficient information to code with codable children: Secondary | ICD-10-CM

## 2014-07-16 LAB — COMPREHENSIVE METABOLIC PANEL
ALT: 9 U/L (ref 0–53)
AST: 14 U/L (ref 0–37)
Albumin: 3.9 g/dL (ref 3.5–5.2)
Alkaline Phosphatase: 108 U/L (ref 39–117)
BUN: 13 mg/dL (ref 6–23)
CALCIUM: 9 mg/dL (ref 8.4–10.5)
CO2: 26 mEq/L (ref 19–32)
CREATININE: 1.09 mg/dL (ref 0.40–1.50)
Chloride: 104 mEq/L (ref 96–112)
GFR: 83.37 mL/min (ref >60.00)
Glucose, Bld: 204 mg/dL — ABNORMAL HIGH (ref 70–99)
Potassium: 4.3 mEq/L (ref 3.5–5.1)
Sodium: 136 mEq/L (ref 135–145)
TOTAL PROTEIN: 7 g/dL (ref 6.0–8.3)
Total Bilirubin: 0.7 mg/dL (ref 0.2–1.2)

## 2014-07-16 LAB — HEMOGLOBIN A1C: HEMOGLOBIN A1C: 7.8 % — AB (ref 4.6–6.5)

## 2014-07-19 ENCOUNTER — Ambulatory Visit (INDEPENDENT_AMBULATORY_CARE_PROVIDER_SITE_OTHER): Payer: Medicare Other | Admitting: Endocrinology

## 2014-07-19 ENCOUNTER — Encounter: Payer: Self-pay | Admitting: Endocrinology

## 2014-07-19 VITALS — BP 133/71 | HR 67 | Temp 98.3°F | Resp 14 | Ht 69.0 in | Wt 212.6 lb

## 2014-07-19 DIAGNOSIS — I209 Angina pectoris, unspecified: Secondary | ICD-10-CM | POA: Diagnosis not present

## 2014-07-19 DIAGNOSIS — E7800 Pure hypercholesterolemia, unspecified: Secondary | ICD-10-CM

## 2014-07-19 DIAGNOSIS — IMO0002 Reserved for concepts with insufficient information to code with codable children: Secondary | ICD-10-CM

## 2014-07-19 DIAGNOSIS — E538 Deficiency of other specified B group vitamins: Secondary | ICD-10-CM

## 2014-07-19 DIAGNOSIS — I10 Essential (primary) hypertension: Secondary | ICD-10-CM | POA: Diagnosis not present

## 2014-07-19 DIAGNOSIS — E1165 Type 2 diabetes mellitus with hyperglycemia: Secondary | ICD-10-CM

## 2014-07-19 NOTE — Patient Instructions (Addendum)
Novolog 6 units before meals  Please check blood sugars at least half the time about 2 hours after any meal and 3 times per week on waking up. Please bring blood sugar monitor to each visit. Recommended blood sugar levels about 2 hours after meal is 140-180 and on waking up 90-130  Take Gabapentin with meals

## 2014-07-19 NOTE — Progress Notes (Signed)
Patient ID: [name], male   DOB: 1931/05/02, 81 y.o.   MRN: [id number]   Reason for Appointment: follow-up of various problems   History of Present Illness   1.   Type 2 DIABETES MELITUS, long-standing     He has been on basal bolus regimen of insulin for several years with usually fair control His A1c generally is higher than expected for his home blood sugars Over the last couple of years because of weight loss he had required much less mealtime insulin  Recent history: His blood sugars appear to be relatively higher with A1c nearly 8% now He did not bring his blood sugar readings for review and difficult to know when his blood sugars are high He thinks his blood sugars are not over 150 but his lab glucose was over 200 after breakfast; on his previous visit he also had some high readings on his meter He thinks he is compliant with his mealtime insulin before eating as directed Also he thinks fasting readings are fairly good He also has lost weight although is trying to walk up to 30 minutes when the weather is good  Oral hypoglycemic drugs:   metformin ER 0.5g bid        Side effects from medications: diarrhea with 1.5g metformin Insulin regimen: NovoLog 5 units a.c., Lantus 34 units at bedtime           Proper timing of medications in relation to meals: Yes.          Monitors blood glucose: 2-3 times a day.    Glucometer:  One Touch         Blood Glucose readings from recall  Fasting glucose range 115 pc 150, highest hs  Hypoglycemia: Once only on waking up         Meals: 3 meals per day. Breakfast eggs, grits; Lunch 1 pm, dinner 5 pm         Physical activity: walking upto 1 hour   Eye exam last  12/14   Wt Readings from Last 3 Encounters:  07/19/14 212 lb 9.6 oz (96.435 kg)  06/05/14 214 lb 6.4 oz (97.251 kg)  03/26/14 213 lb 12.8 oz (96.979 kg)   Lab Results  Component Value Date   HGBA1C 7.8* 07/16/2014   HGBA1C 7.4* 03/21/2014   HGBA1C 8.9* 10/17/2013   Lab  Results  Component Value Date   MICROALBUR 5.6* 12/24/2013   LDLCALC 77 12/24/2013   CREATININE 1.09 07/16/2014         Medication List       This list is accurate as of: 07/19/14 10:55 AM.  Always use your most recent med list.               aspirin 81 MG chewable tablet  Chew 81 mg by mouth daily.     atorvastatin 40 MG tablet  Commonly known as:  LIPITOR  Take 1 tablet (40 mg total) by mouth daily.     calcium-vitamin D 500-200 MG-UNIT per tablet  Commonly known as:  OSCAL WITH D  Take 1 tablet by mouth daily.     carvedilol 12.5 MG tablet  Commonly known as:  COREG  Take 1 tablet (12.5 mg total) by mouth 2 (two) times daily with a meal.     cephALEXin 500 MG capsule  Commonly known as:  KEFLEX  Take 1 capsule (500 mg total) by mouth 3 (three) times daily.     cholecalciferol 1000 UNITS tablet  Commonly known as:  VITAMIN D  Take 1,000 Units by mouth daily.     cyanocobalamin 500 MCG tablet  Take 500 mcg by mouth daily.     gabapentin 300 MG capsule  Commonly known as:  NEURONTIN  Take 1 capsule (300 mg total) by mouth 3 (three) times daily.     hydrochlorothiazide 12.5 MG capsule  Commonly known as:  MICROZIDE  Take 1 capsule (12.5 mg total) by mouth daily.     insulin aspart 100 UNIT/ML injection  Commonly known as:  novoLOG  Inject 5 Units into the skin 3 (three) times daily with meals.     insulin glargine 100 UNIT/ML injection  Commonly known as:  LANTUS  Inject 0.34 mLs (34 Units total) into the skin every evening.     irbesartan 300 MG tablet  Commonly known as:  AVAPRO  Take 1 tablet (300 mg total) by mouth daily.     ketoconazole 2 % cream  Commonly known as:  NIZORAL  Apply 1 application topically daily.     metFORMIN 500 MG 24 hr tablet  Commonly known as:  GLUCOPHAGE-XR  Take 3 tablets (1,500 mg total) by mouth daily with supper.     nitroGLYCERIN 0.4 MG SL tablet  Commonly known as:  NITROSTAT  Place 1 tablet (0.4 mg total)  under the tongue every 5 (five) minutes as needed for chest pain.     ONETOUCH DELICA LANCETS 54U Misc  USE TO CHECK BLOOD SUGARS 3 TIMES PER DAY     ONETOUCH VERIO test strip  Generic drug:  glucose blood  CHECK BLOOD SUGAR 3 TIMES DAILY     polyethylene glycol packet  Commonly known as:  MIRALAX / GLYCOLAX     VIAGRA 100 MG tablet  Generic drug:  sildenafil  TAKE 1 TABLET (100 MG TOTAL) BY MOUTH DAILY AS NEEDED FOR ERECTILE DYSFUNCTION.        Allergies: No Known Allergies  Past Medical History  Diagnosis Date  . Diabetes mellitus   . ED (erectile dysfunction)   . Hypertension   . Hyperlipidemia   . Pneumonia     hx of  . CHF (congestive heart failure)   . Urination frequency   . Cancer     hx of prostate ca  . Neuromuscular disorder     numbness in hand/cervical issues  . Cataracts, bilateral     hx of  . Syncope     hx of    Past Surgical History  Procedure Laterality Date  . Hernia repair    . Tonsillectomy    . Eye surgery      cataract surgery bilateral  . Small intestine surgery      hx of  . Prostate surgery      s/p ca  . Posterior cervical fusion/foraminotomy  11/18/2011    Procedure: POSTERIOR CERVICAL FUSION/FORAMINOTOMY LEVEL 1;  Surgeon: Eustace Moore, MD;  Location: [facility];  Service: Neurosurgery;  Laterality: Bilateral;    Family History  Problem Relation Age of Onset  . Diabetes Mother   . Hypertension Father     Social History:  reports that he has never smoked. He has never used smokeless tobacco. He reports that he drinks about 1.2 oz of alcohol per week. He reports that he does not use illicit drugs.  Review of Systems:  No recent shortness of breath or leg edema  HYPERTENSION:  this has been well controlled, also occasionally checks at home with  good readings. Currently on Coreg, Avapro And HCTZ Avapro was reduced on his last visit because of relatively higher creatinine and this is better now  Does not feel  lightheaded on standing up  He has had transient anemia previously, continues to take B12 supplements as directed  Lab Results  Component Value Date   WBC 6.1 10/17/2013   HGB 13.0 10/17/2013   HCT 39.8 10/17/2013   MCV 96.6 10/17/2013   PLT 175.0 10/17/2013     Hyperlipidemia: Well controlled  with Lipitor 40 mg  Lab Results  Component Value Date   CHOL 124 12/24/2013   HDL 40.30 12/24/2013   LDLCALC 77 12/24/2013   LDLDIRECT 79.0 12/24/2013   TRIG 32.0 12/24/2013   CHOLHDL 3 12/24/2013     Foot exam done in 3/16  NEUROPATHY:  still complains of tingling in his legs especially left side during the day although is able to rest at night.  He is taking gabapentin up to 3 times a day without side effects   Examination:   BP 133/71 mmHg  Pulse 67  Temp(Src) 98.3 F (36.8 C)  Resp 14  Ht 5\' 9"  (1.753 m)  Wt 212 lb 9.6 oz (96.435 kg)  BMI 31.38 kg/m2  SpO2 99%  Body mass index is 31.38 kg/(m^2).    No pedal edema present Diabetic foot exam shows normal monofilament sensation in the toes and plantar surfaces, no skin lesions or ulcers on the feet and normal pedal pulses   ASSESSMENT/ PLAN:   Diabetes type 2   The patient's blood sugars are difficult to assess as he did not bring his monitor and his A1c appears to be going up However considering his age and multiple medical problems and duration of diabetes his blood sugars are probably reasonably controlled Recommendations:  Discussed that his blood sugars are higher at least after breakfast on the lab and he needs to check more readings after meals  He will call if blood sugars are going over 200 frequently  For now we will increase his NovoLog by 1 unit  He will continue the Lantus unchanged less fasting blood sugars go up  Discussed adding protein to breakfast and balanced meals consistently   NEUROPATHY: He has few objective signs but continues to complain of tingling. He can continue the gabapentin 3  times a day and may take extra if symptoms are worse during the day  Hypertension: Appears to have  very good control of his blood pressure even with reducing his Avapro on the last visit   Counseling time over 50% of today's 25 minute visit  Tashari Schoenfelder 07/19/2014, 10:55 AM

## 2014-08-02 ENCOUNTER — Other Ambulatory Visit: Payer: Self-pay | Admitting: Interventional Cardiology

## 2014-08-05 ENCOUNTER — Other Ambulatory Visit: Payer: Self-pay | Admitting: Endocrinology

## 2014-09-17 ENCOUNTER — Other Ambulatory Visit: Payer: Self-pay | Admitting: Endocrinology

## 2014-09-24 ENCOUNTER — Ambulatory Visit (INDEPENDENT_AMBULATORY_CARE_PROVIDER_SITE_OTHER): Payer: Medicare Other | Admitting: Podiatry

## 2014-09-24 DIAGNOSIS — B351 Tinea unguium: Secondary | ICD-10-CM

## 2014-09-24 DIAGNOSIS — M79673 Pain in unspecified foot: Secondary | ICD-10-CM

## 2014-09-24 NOTE — Progress Notes (Signed)
   Subjective:    Patient ID: [name], male    DOB: [birth year], 81 y.o.   MRN: [id number]  HPI Patient is here today for a B/L Nail trim being seen by Dr Prudence Davidson.HPI Presents today chief complaint of painful elongated toenails.  Objective: Pulses are palpable bilateral nails are thick, yellow dystrophic onychomycosis and painful palpation.   Assessment: Onychomycosis with pain in limb.  Plan: Treatment of nails in thickness and length as covered service secondary to pain.   Review of Systems  All other systems reviewed and are negative.      Objective:   Physical Exam        Assessment & Plan:

## 2014-10-01 ENCOUNTER — Other Ambulatory Visit: Payer: Self-pay | Admitting: Endocrinology

## 2014-10-14 ENCOUNTER — Other Ambulatory Visit: Payer: Self-pay | Admitting: *Deleted

## 2014-10-14 MED ORDER — GLUCOSE BLOOD VI STRP: ORAL_STRIP | Status: DC

## 2014-10-15 ENCOUNTER — Other Ambulatory Visit (INDEPENDENT_AMBULATORY_CARE_PROVIDER_SITE_OTHER): Payer: Medicare Other

## 2014-10-15 DIAGNOSIS — IMO0002 Reserved for concepts with insufficient information to code with codable children: Secondary | ICD-10-CM

## 2014-10-15 DIAGNOSIS — E7800 Pure hypercholesterolemia, unspecified: Secondary | ICD-10-CM

## 2014-10-15 DIAGNOSIS — E1165 Type 2 diabetes mellitus with hyperglycemia: Secondary | ICD-10-CM

## 2014-10-15 DIAGNOSIS — E538 Deficiency of other specified B group vitamins: Secondary | ICD-10-CM | POA: Diagnosis not present

## 2014-10-15 LAB — HEMOGLOBIN A1C: Hgb A1c MFr Bld: 7.4 % — ABNORMAL HIGH (ref 4.6–6.5)

## 2014-10-15 LAB — COMPREHENSIVE METABOLIC PANEL
ALT: 10 U/L (ref 0–53)
AST: 14 U/L (ref 0–37)
Albumin: 3.7 g/dL (ref 3.5–5.2)
Alkaline Phosphatase: 110 U/L (ref 39–117)
BUN: 17 mg/dL (ref 6–23)
CO2: 27 meq/L (ref 19–32)
CREATININE: 1.08 mg/dL (ref 0.40–1.50)
Calcium: 9.2 mg/dL (ref 8.4–10.5)
Chloride: 105 mEq/L (ref 96–112)
GFR: 84.21 mL/min (ref >60.00)
Glucose, Bld: 80 mg/dL (ref 70–99)
POTASSIUM: 3.6 meq/L (ref 3.5–5.1)
Sodium: 139 mEq/L (ref 135–145)
Total Bilirubin: 0.7 mg/dL (ref 0.2–1.2)
Total Protein: 6.7 g/dL (ref 6.0–8.3)

## 2014-10-15 LAB — URINALYSIS, ROUTINE W REFLEX MICROSCOPIC
Bilirubin Urine: NEGATIVE
KETONES UR: NEGATIVE
Leukocytes, UA: NEGATIVE
Nitrite: NEGATIVE
Specific Gravity, Urine: 1.02 (ref 1.000–1.030)
Total Protein, Urine: 30 — AB
Urine Glucose: NEGATIVE
Urobilinogen, UA: 1 (ref 0.0–1.0)
pH: 6 (ref 5.0–8.0)

## 2014-10-15 LAB — MICROALBUMIN / CREATININE URINE RATIO
Creatinine,U: 116.8 mg/dL
Microalb Creat Ratio: 30.2 mg/g — ABNORMAL HIGH (ref 0.0–30.0)
Microalb, Ur: 35.3 mg/dL — ABNORMAL HIGH (ref 0.0–1.9)

## 2014-10-15 LAB — CBC
HEMATOCRIT: 38.8 % — AB (ref 39.0–52.0)
Hemoglobin: 13 g/dL (ref 13.0–17.0)
MCHC: 33.6 g/dL (ref 30.0–36.0)
MCV: 93.6 fl (ref 78.0–100.0)
Platelets: 173 10*3/uL (ref 150.0–400.0)
RBC: 4.15 Mil/uL — ABNORMAL LOW (ref 4.22–5.81)
RDW: 15.5 % (ref 11.5–15.5)
WBC: 5.7 10*3/uL (ref 4.0–10.5)

## 2014-10-15 LAB — LIPID PANEL
Cholesterol: 112 mg/dL (ref 0–200)
HDL: 35.7 mg/dL — ABNORMAL LOW (ref >39.00)
LDL Cholesterol: 67 mg/dL (ref 0–99)
NonHDL: 76.3
Total CHOL/HDL Ratio: 3
Triglycerides: 46 mg/dL (ref 0.0–149.0)
VLDL: 9.2 mg/dL (ref 0.0–40.0)

## 2014-10-16 ENCOUNTER — Other Ambulatory Visit: Payer: Self-pay | Admitting: Endocrinology

## 2014-10-18 ENCOUNTER — Ambulatory Visit (INDEPENDENT_AMBULATORY_CARE_PROVIDER_SITE_OTHER): Payer: Medicare Other | Admitting: Endocrinology

## 2014-10-18 ENCOUNTER — Encounter: Payer: Self-pay | Admitting: Endocrinology

## 2014-10-18 VITALS — BP 138/76 | HR 72 | Temp 98.0°F | Resp 16 | Ht 69.0 in | Wt 207.6 lb

## 2014-10-18 DIAGNOSIS — E7800 Pure hypercholesterolemia, unspecified: Secondary | ICD-10-CM

## 2014-10-18 DIAGNOSIS — I10 Essential (primary) hypertension: Secondary | ICD-10-CM

## 2014-10-18 DIAGNOSIS — IMO0002 Reserved for concepts with insufficient information to code with codable children: Secondary | ICD-10-CM

## 2014-10-18 DIAGNOSIS — R600 Localized edema: Secondary | ICD-10-CM | POA: Diagnosis not present

## 2014-10-18 DIAGNOSIS — I209 Angina pectoris, unspecified: Secondary | ICD-10-CM | POA: Diagnosis not present

## 2014-10-18 DIAGNOSIS — E1165 Type 2 diabetes mellitus with hyperglycemia: Secondary | ICD-10-CM

## 2014-10-18 DIAGNOSIS — R809 Proteinuria, unspecified: Secondary | ICD-10-CM

## 2014-10-18 MED ORDER — SPIRONOLACTONE 25 MG PO TABS: 25.0000 mg | ORAL_TABLET | Freq: Every day | ORAL | Status: DC

## 2014-10-18 NOTE — Patient Instructions (Signed)
Check blood sugars on waking up .Marland Kitchen3-4  .Marland Kitchen times a week Also check blood sugars about 2 hours after a meal and do this after different meals by rotation  Recommended blood sugar levels on waking up is 90-130 and about 2 hours after meal is 140-180 Please bring blood sugar monitor to each visit.  If eating less at lunch reduce Novolog to 3 units  Start new Rx spironolactone

## 2014-10-18 NOTE — Progress Notes (Signed)
Patient ID: [name], male   DOB: October 17, 1930, 81 y.o.   MRN: [id number]   Reason for Appointment: follow-up of various problems   History of Present Illness   1.   Type 2 DIABETES MELITUS, long-standing     He has been on basal bolus regimen of insulin for several years with usually fair control His A1c generally is higher than expected for his home blood sugars Over the last couple of years because of weight loss he had required much less mealtime insulin  Recent history:  Insulin regimen: NovoLog 5 units a.c., Lantus 34 units at bedtime            His blood sugars appear to be relatively Better with A1c down to 7.4 He is fairly compliant with his insulin including pre-meal doses He also has lost weight although is trying to walk up to 30 minutes when the weather is good Current blood sugar patterns and problems identified:  Fasting readings are excellent with most readings in the low 100 range, no nocturnal hypoglycemia  His blood sugars are being checked around midday and afternoon and not clear which readings are after meals  He has sporadic readings over 170 in the early afternoon and he does not know if certain foods cause the high readings  Also has had couple of low normal readings along with a glucose of 57 at 3 PM possibly from eating smaller lunch  Has no readings after supper  He has no side effects from low dose metformin  Overall median reading is 140  Oral hypoglycemic drugs:   metformin ER 0.5g bid        Side effects from medications: diarrhea with 1.5g metformin Proper timing of medications in relation to meals: Yes.          Monitors blood glucose: 2-3 times a day.    Glucometer:  One Touch         Blood Glucose readings from download:  Fasting glucose range 103-211 with median about 145 Midday and afternoon range 57-253 with median reading about 120  Hypoglycemia: Once at 3 PM         Meals: 3 meals per day. Breakfast eggs, grits;  Lunch 1 pm, dinner 5 pm         Physical activity: walking upto 1 hour when weather is good    Eye exam last  12/14   Wt Readings from Last 3 Encounters:  10/18/14 207 lb 9.6 oz (94.167 kg)  07/19/14 212 lb 9.6 oz (96.435 kg)  06/05/14 214 lb 6.4 oz (97.251 kg)   Lab Results  Component Value Date   HGBA1C 7.4* 10/15/2014   HGBA1C 7.8* 07/16/2014   HGBA1C 7.4* 03/21/2014   Lab Results  Component Value Date   MICROALBUR 35.3* 10/15/2014   LDLCALC 67 10/15/2014   CREATININE 1.08 10/15/2014    EDEMA: See review of systems       Medication List       This list is accurate as of: 10/18/14 11:59 PM.  Always use your most recent med list.               aspirin 81 MG chewable tablet  Chew 81 mg by mouth daily.     atorvastatin 40 MG tablet  Commonly known as:  LIPITOR  TAKE 1 TABLET (40 MG TOTAL) BY MOUTH DAILY.     calcium-vitamin D 500-200 MG-UNIT per tablet  Commonly  known as:  OSCAL WITH D  Take 1 tablet by mouth daily.     carvedilol 12.5 MG tablet  Commonly known as:  COREG  Take 1 tablet (12.5 mg total) by mouth 2 (two) times daily with a meal.     cholecalciferol 1000 UNITS tablet  Commonly known as:  VITAMIN D  Take 1,000 Units by mouth daily.     cyanocobalamin 500 MCG tablet  Take 500 mcg by mouth daily.     gabapentin 300 MG capsule  Commonly known as:  NEURONTIN  Take 1 capsule (300 mg total) by mouth 3 (three) times daily.     glucose blood test strip  Commonly known as:  ONETOUCH VERIO  CHECK BLOOD SUGAR 3 TIMES DAILY DX Code E11.49     hydrochlorothiazide 12.5 MG capsule  Commonly known as:  MICROZIDE  Take 1 capsule (12.5 mg total) by mouth daily.     irbesartan 300 MG tablet  Commonly known as:  AVAPRO  TAKE 1 TABLET (300 MG TOTAL) BY MOUTH DAILY.     LANTUS 100 UNIT/ML injection  Generic drug:  insulin glargine  INJECT 0.34 MLS (34 UNITS TOTAL) INTO THE SKIN EVERY EVENING.     metFORMIN 500 MG 24 hr tablet  Commonly known as:   GLUCOPHAGE-XR  Take 3 tablets (1,500 mg total) by mouth daily with supper.     nitroGLYCERIN 0.4 MG SL tablet  Commonly known as:  NITROSTAT  Place 1 tablet (0.4 mg total) under the tongue every 5 (five) minutes as needed for chest pain.     NOVOLOG 100 UNIT/ML injection  Generic drug:  insulin aspart  INJECT 5 UNITS INTO THE SKIN 3 TIMES DAILY WITH MEALS     ONETOUCH DELICA LANCETS 53G Misc  USE TO CHECK BLOOD SUGARS 3 TIMES PER DAY     polyethylene glycol packet  Commonly known as:  MIRALAX / GLYCOLAX     spironolactone 25 MG tablet  Commonly known as:  ALDACTONE  Take 1 tablet (25 mg total) by mouth daily.     VIAGRA 100 MG tablet  Generic drug:  sildenafil  TAKE 1 TABLET (100 MG TOTAL) BY MOUTH DAILY AS NEEDED FOR ERECTILE DYSFUNCTION.        Allergies: No Known Allergies  Past Medical History  Diagnosis Date  . Diabetes mellitus   . ED (erectile dysfunction)   . Hypertension   . Hyperlipidemia   . Pneumonia     hx of  . CHF (congestive heart failure)   . Urination frequency   . Cancer     hx of prostate ca  . Neuromuscular disorder     numbness in hand/cervical issues  . Cataracts, bilateral     hx of  . Syncope     hx of    Past Surgical History  Procedure Laterality Date  . Hernia repair    . Tonsillectomy    . Eye surgery      cataract surgery bilateral  . Small intestine surgery      hx of  . Prostate surgery      s/p ca  . Posterior cervical fusion/foraminotomy  11/18/2011    Procedure: POSTERIOR CERVICAL FUSION/FORAMINOTOMY LEVEL 1;  Surgeon: Eustace Moore, MD;  Location: [facility];  Service: Neurosurgery;  Laterality: Bilateral;    Family History  Problem Relation Age of Onset  . Diabetes Mother   . Hypertension Father     Social History:  reports that he  has never smoked. He has never used smokeless tobacco. He reports that he drinks about 1.2 oz of alcohol per week. He reports that he does not use illicit drugs.  Review of  Systems:  No recent shortness of breath on exertion  EDEMA: He is concerned about his lower legs swelling especially at the end of the day, currently not controlled with HCTZ 12.5 mg Does not wear elastic stockings  HYPERTENSION:  this has been well controlled, also occasionally checks at home  Currently on Coreg, Avapro 150 mg And HCTZ Electrolytes and renal function as follows:  Lab Results  Component Value Date   CREATININE 1.08 10/15/2014   BUN 17 10/15/2014   NA 139 10/15/2014   K 3.6 10/15/2014   CL 105 10/15/2014   CO2 27 10/15/2014    He has had transient anemia previously, continues to take B12 supplements as directed  Lab Results  Component Value Date   WBC 5.7 10/15/2014   HGB 13.0 10/15/2014   HCT 38.8* 10/15/2014   MCV 93.6 10/15/2014   PLT 173.0 10/15/2014     Hyperlipidemia: Well controlled  with Lipitor 40 mg  Lab Results  Component Value Date   CHOL 112 10/15/2014   HDL 35.70* 10/15/2014   LDLCALC 67 10/15/2014   LDLDIRECT 79.0 12/24/2013   TRIG 46.0 10/15/2014   CHOLHDL 3 10/15/2014     Foot exam done in 3/16 with normal findings  NEUROPATHY:  Less complaints of tingling in his legs especially left side during the day although is able to rest at night.  He is taking gabapentin up to 3 times a day without side effects   Examination:   BP 138/76 mmHg  Pulse 72  Temp(Src) 98 F (36.7 C)  Resp 16  Ht 5\' 9"  (1.753 m)  Wt 207 lb 9.6 oz (94.167 kg)  BMI 30.64 kg/m2  SpO2 97%  Body mass index is 30.64 kg/(m^2).    No pedal edema present but he has 2+ lower leg edema Lungs clear  ASSESSMENT/ PLAN:   Diabetes type 2   The patient's blood sugars variably controlled during the day although fairly stable in the morning However considering his age and multiple medical problems and duration of diabetes his blood sugars are in a reasonable range and his A1c is slightly better at 7.4 He is generally following his diet and trying to walk along with  some weight loss also Recommendations:  Discussed need to check some blood sugars after breakfast and supper as he is checking them around lunchtime mostly  Since he has relatively lower readings in the afternoon will reduce his lunchtime Novolog to 3 units unless eating a large meal  He will call if he has any further hypoglycemia  No change in Lantus as yet, fasting blood sugar of about 140 average is reasonable for him   continue metformin as renal function is normal and he has no side effects  Reminded him on having balanced meals with some protein and carbohydrate at every meal   EDEMA: He appears to have some dependent edema without any evidence of CHF Most likely this is related to venous insufficiency but he does not want to use elastic stockings Since his potassium is low normal will add low-dose Aldactone, this may be beneficial with his history of CHF Discussed nature of the new medication and need to periodically monitor potassium   Hypertension: Appears to have good control of his blood pressure with 3 drugs  HYPERLIPIDEMIA: Recent lipid panel shows excellent control of LDL, continues to have low HDL.  Will continue Lipitor for now.  No history of CAD  Patient Instructions  Check blood sugars on waking up .Marland Kitchen3-4  .Marland Kitchen times a week Also check blood sugars about 2 hours after a meal and do this after different meals by rotation  Recommended blood sugar levels on waking up is 90-130 and about 2 hours after meal is 140-180 Please bring blood sugar monitor to each visit.  If eating less at lunch reduce Novolog to 3 units  Start new Rx spironolactone    Counseling time on subjects discussed above is over 50% of today's 25 minute visit     Cataldo Cosgriff 10/20/2014, 5:47 PM

## 2014-11-18 NOTE — Progress Notes (Signed)
Cardiology Office Note   Date:  11/19/2014   ID:  RENNER SEBALD, DOB 1930/08/11, MRN [id number]  PCP:  Elayne Snare, MD  Cardiologist:  Sinclair Grooms, MD   Chief Complaint  Patient presents with  . Congestive Heart Failure      History of Present Illness: [name] is a 81 y.o. male who presents for combined systolic and diastolic heart failure, diabetes mellitus, hypertension, and history of syncope.  The patient had some lower extremity swelling. Dr. Dwyane Dee started spiral lactone 25 mg per day in addition to HCTZ. Lower extremity of the swelling has resolved. He did not have dyspnea, chest pain, orthopnea. He denies dyspnea.  Past Medical History  Diagnosis Date  . Diabetes mellitus   . ED (erectile dysfunction)   . Hypertension   . Hyperlipidemia   . Pneumonia     hx of  . CHF (congestive heart failure)   . Urination frequency   . Cancer     hx of prostate ca  . Neuromuscular disorder     numbness in hand/cervical issues  . Cataracts, bilateral     hx of  . Syncope     hx of    Past Surgical History  Procedure Laterality Date  . Hernia repair    . Tonsillectomy    . Eye surgery      cataract surgery bilateral  . Small intestine surgery      hx of  . Prostate surgery      s/p ca  . Posterior cervical fusion/foraminotomy  11/18/2011    Procedure: POSTERIOR CERVICAL FUSION/FORAMINOTOMY LEVEL 1;  Surgeon: Eustace Moore, MD;  Location: [facility];  Service: Neurosurgery;  Laterality: Bilateral;     Current Outpatient Prescriptions  Medication Sig Dispense Refill  . aspirin 81 MG chewable tablet Chew 81 mg by mouth daily.    Marland Kitchen atorvastatin (LIPITOR) 40 MG tablet TAKE 1 TABLET (40 MG TOTAL) BY MOUTH DAILY. 90 tablet 1  . calcium-vitamin D (OSCAL WITH D) 500-200 MG-UNIT per tablet Take 1 tablet by mouth daily.    . carvedilol (COREG) 12.5 MG tablet Take 1 tablet (12.5 mg total) by mouth 2 (two) times daily with a meal. 60 tablet 11  . cholecalciferol  (VITAMIN D) 1000 UNITS tablet Take 1,000 Units by mouth daily.    . cyanocobalamin 500 MCG tablet Take 500 mcg by mouth daily.    Marland Kitchen gabapentin (NEURONTIN) 300 MG capsule Take 1 capsule (300 mg total) by mouth 3 (three) times daily. 270 capsule 1  . glucose blood (ONETOUCH VERIO) test strip CHECK BLOOD SUGAR 3 TIMES DAILY DX Code E11.49 300 each 1  . hydrochlorothiazide (MICROZIDE) 12.5 MG capsule Take 1 capsule (12.5 mg total) by mouth daily. 30 capsule 11  . irbesartan (AVAPRO) 300 MG tablet TAKE 1 TABLET (300 MG TOTAL) BY MOUTH DAILY. 90 tablet 1  . LANTUS 100 UNIT/ML injection INJECT 0.34 MLS (34 UNITS TOTAL) INTO THE SKIN EVERY EVENING. 30 mL 1  . metFORMIN (GLUCOPHAGE-XR) 500 MG 24 hr tablet Take 3 tablets (1,500 mg total) by mouth daily with supper. 90 tablet 3  . nitroGLYCERIN (NITROSTAT) 0.4 MG SL tablet Place 1 tablet (0.4 mg total) under the tongue every 5 (five) minutes as needed for chest pain. 25 tablet 3  . NOVOLOG 100 UNIT/ML injection INJECT 5 UNITS INTO THE SKIN 3 TIMES DAILY WITH MEALS 20 mL 1  . ONETOUCH DELICA LANCETS 99M MISC USE TO CHECK BLOOD  SUGARS 3 TIMES PER DAY 100 each 5  . spironolactone (ALDACTONE) 25 MG tablet Take 1 tablet (25 mg total) by mouth daily. 30 tablet 1  . VIAGRA 100 MG tablet TAKE 1 TABLET (100 MG TOTAL) BY MOUTH DAILY AS NEEDED FOR ERECTILE DYSFUNCTION. 90 tablet 1   No current facility-administered medications for this visit.    Allergies:   Review of patient's allergies indicates no known allergies.    Social History:  The patient  reports that he has never smoked. He has never used smokeless tobacco. He reports that he drinks about 1.2 oz of alcohol per week. He reports that he does not use illicit drugs.   Family History:  The patient's family history includes Diabetes in his mother; Hypertension in his father.    ROS:  Please see the history of present illness.   Otherwise, review of systems are positive for none.   All other systems are  reviewed and negative.    PHYSICAL EXAM: VS:  BP 112/60 mmHg  Pulse 70  Ht 5\' 9"  (1.753 m)  Wt 91.989 kg (202 lb 12.8 oz)  BMI 29.93 kg/m2 , BMI Body mass index is 29.93 kg/(m^2). GEN: Well nourished, well developed, in no acute distress HEENT: normal Neck: no JVD, carotid bruits, or masses Cardiac: RRR; no murmurs, rubs, or gallops,no edema  Respiratory:  clear to auscultation bilaterally, normal work of breathing GI: soft, nontender, nondistended, + BS MS: no deformity or atrophy Skin: warm and dry, no rash Neuro:  Strength and sensation are intact Psych: euthymic mood, full affect   EKG:  EKG is not ordered today.    Recent Labs: 10/15/2014: ALT 10; BUN 17; Creatinine, Ser 1.08; Hemoglobin 13.0; Platelets 173.0; Potassium 3.6; Sodium 139    Lipid Panel    Component Value Date/Time   CHOL 112 10/15/2014 0828   TRIG 46.0 10/15/2014 0828   HDL 35.70* 10/15/2014 0828   CHOLHDL 3 10/15/2014 0828   VLDL 9.2 10/15/2014 0828   LDLCALC 67 10/15/2014 0828   LDLDIRECT 79.0 12/24/2013 1048      Wt Readings from Last 3 Encounters:  11/19/14 91.989 kg (202 lb 12.8 oz)  10/18/14 94.167 kg (207 lb 9.6 oz)  07/19/14 96.435 kg (212 lb 9.6 oz)      Other studies Reviewed: Additional studies/ records that were reviewed today include: . Review of the above records demonstrates:    ASSESSMENT AND PLAN:  1. Chronic combined systolic and diastolic heart failure No evidence of volume overload  2. Essential hypertension Controlled  3. Left bundle branch block Not reassessed'    Current medicines are reviewed at length with the patient today.  The patient does not have concerns regarding medicines.  The following changes have been made:  no change  Labs/ tests ordered today include:  No orders of the defined types were placed in this encounter.   Basic metabolic panel today  Disposition:   FU with HS in 6 months  Signed, Sinclair Grooms, MD  11/19/2014 2:20 PM     [address], [place], [place]  [address] Phone: [phone]; Fax: [phone]

## 2014-11-19 ENCOUNTER — Encounter: Payer: Self-pay | Admitting: Endocrinology

## 2014-11-19 ENCOUNTER — Encounter: Payer: Self-pay | Admitting: Interventional Cardiology

## 2014-11-19 ENCOUNTER — Ambulatory Visit (INDEPENDENT_AMBULATORY_CARE_PROVIDER_SITE_OTHER): Payer: Medicare Other | Admitting: Interventional Cardiology

## 2014-11-19 VITALS — BP 112/60 | HR 70 | Ht 69.0 in | Wt 202.8 lb

## 2014-11-19 DIAGNOSIS — I209 Angina pectoris, unspecified: Secondary | ICD-10-CM | POA: Diagnosis not present

## 2014-11-19 DIAGNOSIS — I10 Essential (primary) hypertension: Secondary | ICD-10-CM | POA: Diagnosis not present

## 2014-11-19 DIAGNOSIS — I447 Left bundle-branch block, unspecified: Secondary | ICD-10-CM | POA: Diagnosis not present

## 2014-11-19 DIAGNOSIS — I5042 Chronic combined systolic (congestive) and diastolic (congestive) heart failure: Secondary | ICD-10-CM

## 2014-11-19 LAB — BASIC METABOLIC PANEL
BUN: 22 mg/dL (ref 6–23)
CHLORIDE: 103 meq/L (ref 96–112)
CO2: 25 mEq/L (ref 19–32)
Calcium: 9.4 mg/dL (ref 8.4–10.5)
Creatinine, Ser: 1.35 mg/dL (ref 0.40–1.50)
GFR: 65.08 mL/min (ref >60.00)
GLUCOSE: 141 mg/dL — AB (ref 70–99)
Potassium: 4 mEq/L (ref 3.5–5.1)
Sodium: 138 mEq/L (ref 135–145)

## 2014-11-19 NOTE — Patient Instructions (Signed)
Medication Instructions:  Your physician recommends that you continue on your current medications as directed. Please refer to the Current Medication list given to you today.   Labwork: Bmet today  Testing/Procedures: None   Follow-Up: Your physician wants you to follow-up in: 6 months with Dr.Smith You will receive a reminder letter in the mail two months in advance. If you don't receive a letter, please call our office to schedule the follow-up appointment.   Any Other Special Instructions Will Be Listed Below (If Applicable).

## 2014-11-21 ENCOUNTER — Telehealth: Payer: Self-pay

## 2014-11-21 NOTE — Telephone Encounter (Signed)
Pt aware of lab results. Kidney function and potassium were normal. We will fwd results to Dr.Kumar Pt verbalized understanding.

## 2014-11-21 NOTE — Telephone Encounter (Signed)
-----   Message from Belva Crome, MD sent at 11/19/2014  6:45 PM EDT ----- Kidney function and potassium were normal. I will 4 to Dr. Dwyane Dee.

## 2014-11-29 ENCOUNTER — Ambulatory Visit: Payer: Medicare Other | Admitting: Endocrinology

## 2014-12-12 ENCOUNTER — Other Ambulatory Visit: Payer: Self-pay | Admitting: Endocrinology

## 2014-12-23 ENCOUNTER — Other Ambulatory Visit: Payer: Medicare Other

## 2014-12-24 ENCOUNTER — Other Ambulatory Visit (INDEPENDENT_AMBULATORY_CARE_PROVIDER_SITE_OTHER): Payer: Medicare Other

## 2014-12-24 DIAGNOSIS — R600 Localized edema: Secondary | ICD-10-CM

## 2014-12-24 LAB — BASIC METABOLIC PANEL
BUN: 26 mg/dL — AB (ref 6–23)
CALCIUM: 9.6 mg/dL (ref 8.4–10.5)
CO2: 27 mEq/L (ref 19–32)
CREATININE: 1.27 mg/dL (ref 0.40–1.50)
Chloride: 103 mEq/L (ref 96–112)
GFR: 69.81 mL/min (ref >60.00)
GLUCOSE: 160 mg/dL — AB (ref 70–99)
Potassium: 4.6 mEq/L (ref 3.5–5.1)
Sodium: 137 mEq/L (ref 135–145)

## 2014-12-26 ENCOUNTER — Ambulatory Visit (INDEPENDENT_AMBULATORY_CARE_PROVIDER_SITE_OTHER): Payer: Medicare Other | Admitting: Endocrinology

## 2014-12-26 ENCOUNTER — Encounter: Payer: Self-pay | Admitting: Endocrinology

## 2014-12-26 ENCOUNTER — Ambulatory Visit: Payer: Medicare Other | Admitting: Endocrinology

## 2014-12-26 ENCOUNTER — Ambulatory Visit (INDEPENDENT_AMBULATORY_CARE_PROVIDER_SITE_OTHER): Payer: Medicare Other | Admitting: Podiatry

## 2014-12-26 ENCOUNTER — Encounter: Payer: Self-pay | Admitting: Podiatry

## 2014-12-26 VITALS — BP 132/78 | HR 71 | Temp 97.8°F | Resp 16 | Ht 69.0 in | Wt 202.0 lb

## 2014-12-26 DIAGNOSIS — I209 Angina pectoris, unspecified: Secondary | ICD-10-CM

## 2014-12-26 DIAGNOSIS — E119 Type 2 diabetes mellitus without complications: Secondary | ICD-10-CM | POA: Diagnosis not present

## 2014-12-26 DIAGNOSIS — M79673 Pain in unspecified foot: Secondary | ICD-10-CM

## 2014-12-26 DIAGNOSIS — B351 Tinea unguium: Secondary | ICD-10-CM | POA: Diagnosis not present

## 2014-12-26 DIAGNOSIS — E1140 Type 2 diabetes mellitus with diabetic neuropathy, unspecified: Secondary | ICD-10-CM | POA: Diagnosis not present

## 2014-12-26 NOTE — Progress Notes (Signed)
Patient ID: [name], male   DOB: [birth year], 81 y.o.   MRN: [id number] Complaint:  Visit Type: Patient returns to my office for continued preventative foot care services. Complaint: Patient states" my nails have grown long and thick and become painful to walk and wear shoes" Patient has been diagnosed with DM with no foot complications. The patient presents for preventative foot care services. No changes to ROS  Podiatric Exam: Vascular: dorsalis pedis and posterior tibial pulses are palpable bilateral. Capillary return is immediate. Temperature gradient is WNL. Skin turgor WNL  Sensorium: Diminished  Semmes Weinstein monofilament test. Normal tactile sensation bilaterally. Nail Exam: Pt has thick disfigured discolored nails with subungual debris noted bilateral entire nail hallux through fifth toenails Ulcer Exam: There is no evidence of ulcer or pre-ulcerative changes or infection. Orthopedic Exam: Muscle tone and strength are WNL. No limitations in general ROM. No crepitus or effusions noted. Foot type and digits show no abnormalities. Bony prominences are unremarkable. Skin: No Porokeratosis. No infection or ulcers  Diagnosis:  Onychomycosis, , Pain in right toe, pain in left toes  Treatment & Plan Procedures and Treatment: Consent by patient was obtained for treatment procedures. The patient understood the discussion of treatment and procedures well. All questions were answered thoroughly reviewed. Debridement of mycotic and hypertrophic toenails, 1 through 5 bilateral and clearing of subungual debris. No ulceration, no infection noted.  Return Visit-Office Procedure: Patient instructed to return to the office for a follow up visit 3 months for continued evaluation and treatment.

## 2014-12-26 NOTE — Progress Notes (Signed)
Patient ID: [name], male   DOB: 07/17/1930, 81 y.o.   MRN: [id number]   Reason for Appointment: follow-up of various problems   History of Present Illness   1.   Type 2 DIABETES MELITUS, long-standing     He has been on basal bolus regimen of insulin for several years with usually fair control His A1c generally is higher than expected for his home blood sugars Over the last couple of years because of weight loss he had required much less mealtime insulin  Recent history:  Insulin regimen: NovoLog 5 units a.c., Lantus 34 units at bedtime            His blood sugars have been overall fairly well controlled with last A1c 7.4 He is generally compliant with his insulin including pre-meal doses  Current blood sugar patterns and problems identified:  Fasting readings are generally higher compared to the last visit and not clear why.  Has only occasional readings below 140  He has somewhat variable readings later in the day but not consistently high  Blood sugars are sometimes significantly high after supper which he thinks is from going off his diet and eating sweets  His weight has gone down but previously was having some edema also  No hypoglycemia  He has no side effects from low dose metformin   Oral hypoglycemic drugs:   metformin ER 0.5g bid        Side effects from medications: diarrhea with 1.5g metformin Proper timing of medications in relation to meals: Yes.          Monitors blood glucose: 2-3 times a day.    Glucometer:  One Touch         Blood Glucose readings from download:  Mean values apply above for all meters except median for One Touch  PRE-MEAL Fasting Lunch Dinner Bedtime Overall  Glucose range:  117-218   127-220   133-176   112-301    Mean/median:  163      T1    Meals: 3 meals per day. Breakfast eggs, grits; Lunch 1 pm, dinner 5 pm         Physical activity: walking upto 1 hour when weather is good    Eye exam last  12/14   Wt  Readings from Last 3 Encounters:  12/26/14 202 lb (91.627 kg)  11/19/14 202 lb 12.8 oz (91.989 kg)  10/18/14 207 lb 9.6 oz (94.167 kg)   Lab Results  Component Value Date   HGBA1C 7.4* 10/15/2014   HGBA1C 7.8* 07/16/2014   HGBA1C 7.4* 03/21/2014   Lab Results  Component Value Date   MICROALBUR 35.3* 10/15/2014   LDLCALC 67 10/15/2014   CREATININE 1.27 12/24/2014    EDEMA: See review of systems       Medication List       This list is accurate as of: 12/26/14 11:59 PM.  Always use your most recent med list.               aspirin 81 MG chewable tablet  Chew 81 mg by mouth daily.     atorvastatin 40 MG tablet  Commonly known as:  LIPITOR  TAKE 1 TABLET (40 MG TOTAL) BY MOUTH DAILY.     calcium-vitamin D 500-200 MG-UNIT per tablet  Commonly known as:  OSCAL WITH D  Take 1 tablet by mouth daily.     carvedilol 12.5 MG tablet  Commonly  known as:  COREG  Take 1 tablet (12.5 mg total) by mouth 2 (two) times daily with a meal.     cholecalciferol 1000 UNITS tablet  Commonly known as:  VITAMIN D  Take 1,000 Units by mouth daily.     cyanocobalamin 500 MCG tablet  Take 500 mcg by mouth daily.     gabapentin 300 MG capsule  Commonly known as:  NEURONTIN  Take 1 capsule (300 mg total) by mouth 3 (three) times daily.     glucose blood test strip  Commonly known as:  ONETOUCH VERIO  CHECK BLOOD SUGAR 3 TIMES DAILY DX Code E11.49     hydrochlorothiazide 12.5 MG capsule  Commonly known as:  MICROZIDE  Take 1 capsule (12.5 mg total) by mouth daily.     irbesartan 300 MG tablet  Commonly known as:  AVAPRO  TAKE 1 TABLET (300 MG TOTAL) BY MOUTH DAILY.     LANTUS 100 UNIT/ML injection  Generic drug:  insulin glargine  INJECT 0.34 MLS (34 UNITS TOTAL) INTO THE SKIN EVERY EVENING.     metFORMIN 500 MG 24 hr tablet  Commonly known as:  GLUCOPHAGE-XR  Take 3 tablets (1,500 mg total) by mouth daily with supper.     nitroGLYCERIN 0.4 MG SL tablet  Commonly known as:   NITROSTAT  Place 1 tablet (0.4 mg total) under the tongue every 5 (five) minutes as needed for chest pain.     NOVOLOG 100 UNIT/ML injection  Generic drug:  insulin aspart  INJECT 5 UNITS INTO THE SKIN 3 TIMES DAILY WITH MEALS     ONETOUCH DELICA LANCETS 24O Misc  USE TO CHECK BLOOD SUGARS 3 TIMES PER DAY     spironolactone 25 MG tablet  Commonly known as:  ALDACTONE  TAKE 1 TABLET (25 MG TOTAL) BY MOUTH DAILY.     VIAGRA 100 MG tablet  Generic drug:  sildenafil  TAKE 1 TABLET (100 MG TOTAL) BY MOUTH DAILY AS NEEDED FOR ERECTILE DYSFUNCTION.        Allergies: No Known Allergies  Past Medical History  Diagnosis Date  . Diabetes mellitus   . ED (erectile dysfunction)   . Hypertension   . Hyperlipidemia   . Pneumonia     hx of  . CHF (congestive heart failure)   . Urination frequency   . Cancer     hx of prostate ca  . Neuromuscular disorder     numbness in hand/cervical issues  . Cataracts, bilateral     hx of  . Syncope     hx of    Past Surgical History  Procedure Laterality Date  . Hernia repair    . Tonsillectomy    . Eye surgery      cataract surgery bilateral  . Small intestine surgery      hx of  . Prostate surgery      s/p ca  . Posterior cervical fusion/foraminotomy  11/18/2011    Procedure: POSTERIOR CERVICAL FUSION/FORAMINOTOMY LEVEL 1;  Surgeon: Eustace Moore, MD;  Location: [facility];  Service: Neurosurgery;  Laterality: Bilateral;    Family History  Problem Relation Age of Onset  . Diabetes Mother   . Hypertension Father     Social History:  reports that he has never smoked. He has never used smokeless tobacco. He reports that he drinks about 1.2 oz of alcohol per week. He reports that he does not use illicit drugs.  Review of Systems:  EDEMA: He is  not have any further edema with adding Aldactone 25 mg daily to his HCTZ 12.5 mg daily  Does not wear elastic stockings recently even though he was advised to do so  He was seen recently  by cardiologist who felt his CHF has been well controlled and no changes were made  HYPERTENSION:  this has been well controlled, also occasionally checks at home  Currently on Coreg, Avapro 150 mg And HCTZ Electrolytes and renal function as follows:  Lab Results  Component Value Date   CREATININE 1.27 12/24/2014   BUN 26* 12/24/2014   NA 137 12/24/2014   K 4.6 12/24/2014   CL 103 12/24/2014   CO2 27 12/24/2014    He has had transient anemia previously, continues to take B12 supplements as directed  Lab Results  Component Value Date   WBC 5.7 10/15/2014   HGB 13.0 10/15/2014   HCT 38.8* 10/15/2014   MCV 93.6 10/15/2014   PLT 173.0 10/15/2014     Hyperlipidemia: Well controlled  with Lipitor 40 mg  Lab Results  Component Value Date   CHOL 112 10/15/2014   HDL 35.70* 10/15/2014   LDLCALC 67 10/15/2014   LDLDIRECT 79.0 12/24/2013   TRIG 46.0 10/15/2014   CHOLHDL 3 10/15/2014     Foot exam done in 3/16 with normal findings  NEUROPATHY:  Recently less complaints of tingling in his legs especially left side during the day although is able to rest at night.   He is taking gabapentin up to 3 times a day without side effects   Examination:   BP 132/78 mmHg  Pulse 71  Temp(Src) 97.8 F (36.6 C)  Resp 16  Ht 5\' 9"  (1.753 m)  Wt 202 lb (91.627 kg)  BMI 29.82 kg/m2  SpO2 97%  Body mass index is 29.82 kg/(m^2).    No pedal edema present on either side  ASSESSMENT/ PLAN:   Diabetes type 2   The patient's blood sugars are overall relatively higher including fasting Most of his high readings are related to inconsistent diet Since his A1c has been 7.4 and he does not need tight control will let him have a more liberal diet along with extra mealtime coverage  However since his fasting blood sugars are fairly consistently high we can increase Lantus to 36 at least Details instructions below  EDEMA: Resolved and he will continue Aldactone Electrolytes  normal  Hypertension:  good control of his blood pressure with 3 drugs    Patient Instructions  36 Lantus at nite  If eating out or sweet take 7-8 Novolog instead of 5       Abdo Denault 12/27/2014, 10:07 AM

## 2014-12-26 NOTE — Patient Instructions (Signed)
36 Lantus at nite  If eating out or sweet take 7-8 Novolog instead of 5

## 2015-01-22 ENCOUNTER — Other Ambulatory Visit: Payer: Self-pay | Admitting: Endocrinology

## 2015-01-22 DIAGNOSIS — Z23 Encounter for immunization: Secondary | ICD-10-CM | POA: Diagnosis not present

## 2015-02-26 DIAGNOSIS — C61 Malignant neoplasm of prostate: Secondary | ICD-10-CM | POA: Diagnosis not present

## 2015-03-05 ENCOUNTER — Encounter: Payer: Self-pay | Admitting: *Deleted

## 2015-03-05 DIAGNOSIS — Z961 Presence of intraocular lens: Secondary | ICD-10-CM | POA: Diagnosis not present

## 2015-03-05 LAB — HM DIABETES EYE EXAM

## 2015-03-10 ENCOUNTER — Other Ambulatory Visit: Payer: Self-pay | Admitting: Endocrinology

## 2015-03-25 ENCOUNTER — Other Ambulatory Visit (INDEPENDENT_AMBULATORY_CARE_PROVIDER_SITE_OTHER): Payer: Medicare Other

## 2015-03-25 DIAGNOSIS — E119 Type 2 diabetes mellitus without complications: Secondary | ICD-10-CM

## 2015-03-25 LAB — COMPREHENSIVE METABOLIC PANEL
ALBUMIN: 3.7 g/dL (ref 3.5–5.2)
ALT: 10 U/L (ref 0–53)
AST: 14 U/L (ref 0–37)
Alkaline Phosphatase: 100 U/L (ref 39–117)
BUN: 16 mg/dL (ref 6–23)
CALCIUM: 9.4 mg/dL (ref 8.4–10.5)
CHLORIDE: 106 meq/L (ref 96–112)
CO2: 26 meq/L (ref 19–32)
Creatinine, Ser: 1.17 mg/dL (ref 0.40–1.50)
GFR: 76.7 mL/min (ref >60.00)
Glucose, Bld: 94 mg/dL (ref 70–99)
Potassium: 4.2 mEq/L (ref 3.5–5.1)
Sodium: 139 mEq/L (ref 135–145)
Total Bilirubin: 0.4 mg/dL (ref 0.2–1.2)
Total Protein: 6.6 g/dL (ref 6.0–8.3)

## 2015-03-25 LAB — HEMOGLOBIN A1C: Hgb A1c MFr Bld: 7.5 % — ABNORMAL HIGH (ref 4.6–6.5)

## 2015-03-28 ENCOUNTER — Ambulatory Visit (INDEPENDENT_AMBULATORY_CARE_PROVIDER_SITE_OTHER): Payer: Medicare Other | Admitting: Endocrinology

## 2015-03-28 ENCOUNTER — Encounter: Payer: Self-pay | Admitting: Endocrinology

## 2015-03-28 VITALS — BP 138/70 | HR 69 | Temp 98.6°F | Resp 14 | Ht 69.0 in | Wt 206.6 lb

## 2015-03-28 DIAGNOSIS — E538 Deficiency of other specified B group vitamins: Secondary | ICD-10-CM

## 2015-03-28 DIAGNOSIS — E119 Type 2 diabetes mellitus without complications: Secondary | ICD-10-CM | POA: Diagnosis not present

## 2015-03-28 DIAGNOSIS — I10 Essential (primary) hypertension: Secondary | ICD-10-CM

## 2015-03-28 DIAGNOSIS — I209 Angina pectoris, unspecified: Secondary | ICD-10-CM | POA: Diagnosis not present

## 2015-03-28 NOTE — Patient Instructions (Signed)
NOVOLOG 6-17 AT SUPPER  Check blood sugars on waking up 3  times a week Also check blood sugars about 2 hours after a meal and do this after different meals by rotation  Recommended blood sugar levels on waking up is 90-130 and about 2 hours after meal is 130-160  Please bring your blood sugar monitor to each visit, thank you

## 2015-03-28 NOTE — Progress Notes (Signed)
Patient ID: [name], male   DOB: 1930-07-26, 81 y.o.   MRN: [id number]   Reason for Appointment: follow-up of various problems   History of Present Illness   1.   Type 2 DIABETES MELITUS, long-standing     He has been on basal bolus regimen of insulin for several years with usually fair control His A1c generally is higher than expected for his home blood sugars Over the last couple of years because of weight loss he had required much less mealtime insulin  Recent history:  Insulin regimen: NovoLog 5 units a.c., Lantus 36 units at bedtime            His blood sugars have been overall fairly well controlled with last A1c 7.5 He is generally compliant with his insulin including pre-meal doses On his last visit fasting readings are relatively high and Lantus was increased slightly  Current blood sugar patterns and problems identified:  Fasting readings are still periodically higher and not clear if these are related to higher readings the night before supper: Last night glucose was 175 and this morning 118  Also his lab glucose an hour after his home reading was significantly higher  Sometimes will have a higher fat meal in the evening gravy.  Blood sugars are generally much better in the afternoons  Does not adjust his insulin based on his meal size  No hypoglycemia  He has no side effects from low dose metformin   Oral hypoglycemic drugs:   metformin ER 0.5g bid        Side effects from medications: diarrhea with 1.5g metformin Proper timing of medications in relation to meals: Yes.          Monitors blood glucose: 2-3 times a day.    Glucometer:  One Touch         Blood Glucose readings from download:  Mean values apply above for all meters except median for One Touch  PRE-MEAL Fasting Lunch  2-5 PM  Bedtime Overall  Glucose range:  118-209    96-190   175    Mean/median:  175    155    158     Meals: 3 meals per day.  Breakfast 8 AM, Lunch 1 pm,  dinner 5 pm,   Breakfast eggs, grits; may have some gravy and dinner         Physical activity: walking upto 30 minutes when weather is good      Wt Readings from Last 3 Encounters:  03/28/15 206 lb 9.6 oz (93.713 kg)  12/26/14 202 lb (91.627 kg)  11/19/14 202 lb 12.8 oz (91.989 kg)   Lab Results  Component Value Date   HGBA1C 7.5* 03/25/2015   HGBA1C 7.4* 10/15/2014   HGBA1C 7.8* 07/16/2014   Lab Results  Component Value Date   MICROALBUR 35.3* 10/15/2014   LDLCALC 67 10/15/2014   CREATININE 1.17 03/25/2015            Medication List       This list is accurate as of: 03/28/15  2:48 PM.  Always use your most recent med list.               aspirin 81 MG chewable tablet  Chew 81 mg by mouth daily.     atorvastatin 40 MG tablet  Commonly known as:  LIPITOR  TAKE 1 TABLET (40 MG TOTAL) BY MOUTH DAILY.     calcium-vitamin  D 500-200 MG-UNIT tablet  Commonly known as:  OSCAL WITH D  Take 1 tablet by mouth daily.     carvedilol 12.5 MG tablet  Commonly known as:  COREG  Take 1 tablet (12.5 mg total) by mouth 2 (two) times daily with a meal.     cholecalciferol 1000 UNITS tablet  Commonly known as:  VITAMIN D  Take 1,000 Units by mouth daily.     cyanocobalamin 500 MCG tablet  Take 500 mcg by mouth daily.     FLUZONE HIGH-DOSE 0.5 ML Susy  Generic drug:  Influenza Vac Split High-Dose  TO BE ADMINISTERED BY PHARMACIST FOR IMMUNIZATION     gabapentin 300 MG capsule  Commonly known as:  NEURONTIN  Take 1 capsule (300 mg total) by mouth 3 (three) times daily.     glucose blood test strip  Commonly known as:  ONETOUCH VERIO  CHECK BLOOD SUGAR 3 TIMES DAILY DX Code E11.49     hydrochlorothiazide 12.5 MG capsule  Commonly known as:  MICROZIDE  Take 1 capsule (12.5 mg total) by mouth daily.     irbesartan 300 MG tablet  Commonly known as:  AVAPRO  TAKE 1 TABLET (300 MG TOTAL) BY MOUTH DAILY.     LANTUS 100 UNIT/ML injection  Generic drug:  insulin  glargine  INJECT 0.34 MLS (34 UNITS TOTAL) INTO THE SKIN EVERY EVENING.     metFORMIN 500 MG 24 hr tablet  Commonly known as:  GLUCOPHAGE-XR  Take 3 tablets (1,500 mg total) by mouth daily with supper.     nitroGLYCERIN 0.4 MG SL tablet  Commonly known as:  NITROSTAT  Place 1 tablet (0.4 mg total) under the tongue every 5 (five) minutes as needed for chest pain.     NOVOLOG 100 UNIT/ML injection  Generic drug:  insulin aspart  INJECT 5 UNITS INTO THE SKIN 3 TIMES DAILY WITH MEALS     ONETOUCH DELICA LANCETS 99991111 Misc  USE TO CHECK BLOOD SUGARS 3 TIMES PER DAY     spironolactone 25 MG tablet  Commonly known as:  ALDACTONE  TAKE 1 TABLET (25 MG TOTAL) BY MOUTH DAILY.     VIAGRA 100 MG tablet  Generic drug:  sildenafil  TAKE 1 TABLET (100 MG TOTAL) BY MOUTH DAILY AS NEEDED FOR ERECTILE DYSFUNCTION.        Allergies: No Known Allergies  Past Medical History  Diagnosis Date  . Diabetes mellitus   . ED (erectile dysfunction)   . Hypertension   . Hyperlipidemia   . Pneumonia     hx of  . CHF (congestive heart failure) ([place])   . Urination frequency   . Cancer ([place])     hx of prostate ca  . Neuromuscular disorder (HCC)     numbness in hand/cervical issues  . Cataracts, bilateral     hx of  . Syncope     hx of    Past Surgical History  Procedure Laterality Date  . Hernia repair    . Tonsillectomy    . Eye surgery      cataract surgery bilateral  . Small intestine surgery      hx of  . Prostate surgery      s/p ca  . Posterior cervical fusion/foraminotomy  11/18/2011    Procedure: POSTERIOR CERVICAL FUSION/FORAMINOTOMY LEVEL 1;  Surgeon: Eustace Moore, MD;  Location: [facility];  Service: Neurosurgery;  Laterality: Bilateral;    Family History  Problem Relation Age of Onset  .  Diabetes Mother   . Hypertension Father     Social History:  reports that he has never smoked. He has never used smokeless tobacco. He reports that he drinks about 1.2 oz of alcohol  per week. He reports that he does not use illicit drugs.  Review of Systems:  EDEMA: He is not have any further edema with adding Aldactone 25 mg daily to his HCTZ 12.5 mg daily   HYPERTENSION:  this has been well controlled Currently on Coreg, Avapro 150 mg And HCTZ Also followed by cardiologist for history of March CHF  Electrolytes and renal function as follows:  Lab Results  Component Value Date   CREATININE 1.17 03/25/2015   BUN 16 03/25/2015   NA 139 03/25/2015   K 4.2 03/25/2015   CL 106 03/25/2015   CO2 26 03/25/2015    He has had 1 women B-12 deficiency, continues to take B12 supplements as directed  Lab Results  Component Value Date   WBC 5.7 10/15/2014   HGB 13.0 10/15/2014   HCT 38.8* 10/15/2014   MCV 93.6 10/15/2014   PLT 173.0 10/15/2014     Hyperlipidemia: Well controlled  with Lipitor 40 mg  Lab Results  Component Value Date   CHOL 112 10/15/2014   HDL 35.70* 10/15/2014   LDLCALC 67 10/15/2014   LDLDIRECT 79.0 12/24/2013   TRIG 46.0 10/15/2014   CHOLHDL 3 10/15/2014     Foot exam done in 3/16 with normal findings  NEUROPATHY:  Had symptoms of tingling.   He is taking gabapentin up to 3 times a day without side effects   Examination:   BP 138/70 mmHg  Pulse 69  Temp(Src) 98.6 F (37 C)  Resp 14  Ht 5\' 9"  (1.753 m)  Wt 206 lb 9.6 oz (93.713 kg)  BMI 30.50 kg/m2  SpO2 98%  Body mass index is 30.5 kg/(m^2).    No pedal edema present on either ankle  ASSESSMENT/ PLAN:   Diabetes type 2   The patient's blood sugars are overall reasonably well controlled given his age and duration of diabetes See history of present illness for detailed discussion of his current management, blood sugar patterns and problems identified Periodically has high fasting readings and unclear why He is checking blood sugars primarily before breakfast and supper  Recommended that the have him watches sugars after supper more often He will try be consistent with  low fat diet  No change in Lantus as yet He can take at least 1-2 units more of Novolog at suppertime  Specific instructions below  EDEMA: Better with Aldactone Electrolytes normal  Hypertension:  good control of his blood pressure with 3 drugs    Patient Instructions  NOVOLOG 6-17 AT SUPPER  Check blood sugars on waking up 3  times a week Also check blood sugars about 2 hours after a meal and do this after different meals by rotation  Recommended blood sugar levels on waking up is 90-130 and about 2 hours after meal is 130-160  Please bring your blood sugar monitor to each visit, thank you        [facility] 03/28/2015, 2:48 PM

## 2015-03-29 ENCOUNTER — Other Ambulatory Visit: Payer: Self-pay | Admitting: Endocrinology

## 2015-04-01 ENCOUNTER — Encounter: Payer: Self-pay | Admitting: Podiatry

## 2015-04-01 ENCOUNTER — Ambulatory Visit (INDEPENDENT_AMBULATORY_CARE_PROVIDER_SITE_OTHER): Payer: Medicare Other | Admitting: Podiatry

## 2015-04-01 DIAGNOSIS — M79675 Pain in left toe(s): Secondary | ICD-10-CM

## 2015-04-01 DIAGNOSIS — M79674 Pain in right toe(s): Secondary | ICD-10-CM

## 2015-04-01 DIAGNOSIS — B351 Tinea unguium: Secondary | ICD-10-CM | POA: Diagnosis not present

## 2015-04-01 NOTE — Progress Notes (Signed)
Patient ID: [name], male   DOB: 19-Sep-1930, 81 y.o.   MRN: [id number]  Subjective: This patient presents for scheduled visit complaining of painful toenails and walking wearing shoes and is requesting toenail debridement  Objective: No open skin lesions bilaterally The toenails are elongated, brittle, incurvated, discolored and tender direct palpation 6-10  Assessment: Diabetic with a history of no complications Symptomatic onychomycoses 6-10  Plan: Debridement toenails 10 mechanically and electrically without any bleeding  Reappoint 4 months

## 2015-04-01 NOTE — Patient Instructions (Signed)
Diabetes and Foot Care Diabetes may cause you to have problems because of poor blood supply (circulation) to your feet and legs. This may cause the skin on your feet to become thinner, break easier, and heal more slowly. Your skin may become dry, and the skin may peel and crack. You may also have nerve damage in your legs and feet causing decreased feeling in them. You may not notice minor injuries to your feet that could lead to infections or more serious problems. Taking care of your feet is one of the most important things you can do for yourself.  HOME CARE INSTRUCTIONS  Wear shoes at all times, even in the house. Do not go barefoot. Bare feet are easily injured.  Check your feet daily for blisters, cuts, and redness. If you cannot see the bottom of your feet, use a mirror or ask someone for help.  Wash your feet with warm water (do not use hot water) and mild soap. Then pat your feet and the areas between your toes until they are completely dry. Do not soak your feet as this can dry your skin.  Apply a moisturizing lotion or petroleum jelly (that does not contain alcohol and is unscented) to the skin on your feet and to dry, brittle toenails. Do not apply lotion between your toes.  Trim your toenails straight across. Do not dig under them or around the cuticle. File the edges of your nails with an emery board or nail file.  Do not cut corns or calluses or try to remove them with medicine.  Wear clean socks or stockings every day. Make sure they are not too tight. Do not wear knee-high stockings since they may decrease blood flow to your legs.  Wear shoes that fit properly and have enough cushioning. To break in new shoes, wear them for just a few hours a day. This prevents you from injuring your feet. Always look in your shoes before you put them on to be sure there are no objects inside.  Do not cross your legs. This may decrease the blood flow to your feet.  If you find a minor scrape,  cut, or break in the skin on your feet, keep it and the skin around it clean and dry. These areas may be cleansed with mild soap and water. Do not cleanse the area with peroxide, alcohol, or iodine.  When you remove an adhesive bandage, be sure not to damage the skin around it.  If you have a wound, look at it several times a day to make sure it is healing.  Do not use heating pads or hot water bottles. They may burn your skin. If you have lost feeling in your feet or legs, you may not know it is happening until it is too late.  Make sure your health care provider performs a complete foot exam at least annually or more often if you have foot problems. Report any cuts, sores, or bruises to your health care provider immediately. SEEK MEDICAL CARE IF:   You have an injury that is not healing.  You have cuts or breaks in the skin.  You have an ingrown nail.  You notice redness on your legs or feet.  You feel burning or tingling in your legs or feet.  You have pain or cramps in your legs and feet.  Your legs or feet are numb.  Your feet always feel cold. SEEK IMMEDIATE MEDICAL CARE IF:   There is increasing redness,   swelling, or pain in or around a wound.  There is a red line that goes up your leg.  Pus is coming from a wound.  You develop a fever or as directed by your health care provider.  You notice a bad smell coming from an ulcer or wound.   This information is not intended to replace advice given to you by your health care provider. Make sure you discuss any questions you have with your health care provider.   Document Released: 04/23/2000 Document Revised: 12/27/2012 Document Reviewed: 10/03/2012 Elsevier Interactive Patient Education 2016 [employer]  

## 2015-04-13 ENCOUNTER — Other Ambulatory Visit: Payer: Self-pay | Admitting: Endocrinology

## 2015-04-21 ENCOUNTER — Other Ambulatory Visit: Payer: Self-pay | Admitting: Endocrinology

## 2015-04-26 ENCOUNTER — Other Ambulatory Visit: Payer: Self-pay | Admitting: Endocrinology

## 2015-06-06 ENCOUNTER — Ambulatory Visit (INDEPENDENT_AMBULATORY_CARE_PROVIDER_SITE_OTHER): Payer: Medicare Other | Admitting: Interventional Cardiology

## 2015-06-06 ENCOUNTER — Encounter: Payer: Self-pay | Admitting: Interventional Cardiology

## 2015-06-06 VITALS — BP 116/66 | HR 64 | Ht 69.0 in | Wt 203.1 lb

## 2015-06-06 DIAGNOSIS — I209 Angina pectoris, unspecified: Secondary | ICD-10-CM

## 2015-06-06 DIAGNOSIS — I447 Left bundle-branch block, unspecified: Secondary | ICD-10-CM | POA: Diagnosis not present

## 2015-06-06 DIAGNOSIS — I10 Essential (primary) hypertension: Secondary | ICD-10-CM | POA: Diagnosis not present

## 2015-06-06 DIAGNOSIS — I5042 Chronic combined systolic (congestive) and diastolic (congestive) heart failure: Secondary | ICD-10-CM

## 2015-06-06 NOTE — Patient Instructions (Signed)
Your physician recommends that you continue on your current medications as directed. Please refer to the Current Medication list given to you today. Your physician wants you to follow-up in: 1 year.  You will receive a reminder letter in the mail two months in advance. If you don't receive a letter, please call our office to schedule the follow-up appointment.  You are doing great!  Continue to watch salt in your diet and weigh yourself daily.  Call if weight gain >3 lb in 24 hrs or >5 lb in 1 week. Call if you develop shortness of breath or retain fluid.

## 2015-06-06 NOTE — Progress Notes (Signed)
Cardiology Office Note   Date:  06/06/2015   ID:  [name], DOB [birth year], MRN :[id number]  PCP:  Patrick Snare, MD  Cardiologist:  Patrick Grooms, MD   Chief Complaint  Patient presents with  . Congestive Heart Failure      History of Present Illness: [name] is a 82 y.o. male who presents for combined systolic and diastolic heart failure, left bundle branch block, hypertension, diabetes mellitus, hyperlipidemia, and remote history of syncope.  At 82 years of age she is doing well. He is independent and ambulatory with a cane. No limiting dyspnea, chest pain, ankle edema, and he denies orthopnea and PND. No specific medication side effects. Being followed closely by Dr. Dwyane Blevins his primary care/diabetes specialist.    Past Medical History  Diagnosis Date  . Diabetes mellitus   . ED (erectile dysfunction)   . Hypertension   . Hyperlipidemia   . Pneumonia     hx of  . CHF (congestive heart failure) ([place])   . Urination frequency   . Cancer ([place])     hx of prostate ca  . Neuromuscular disorder (HCC)     numbness in hand/cervical issues  . Cataracts, bilateral     hx of  . Syncope     hx of    Past Surgical History  Procedure Laterality Date  . Hernia repair    . Tonsillectomy    . Eye surgery      cataract surgery bilateral  . Small intestine surgery      hx of  . Prostate surgery      s/p ca  . Posterior cervical fusion/foraminotomy  11/18/2011    Procedure: POSTERIOR CERVICAL FUSION/FORAMINOTOMY LEVEL 1;  Surgeon: Patrick Moore, MD;  Location: [facility];  Service: Neurosurgery;  Laterality: Bilateral;     Current Outpatient Prescriptions  Medication Sig Dispense Refill  . aspirin 81 MG chewable tablet Chew 81 mg by mouth daily.    Marland Kitchen atorvastatin (LIPITOR) 40 MG tablet TAKE 1 TABLET (40 MG TOTAL) BY MOUTH DAILY. 90 tablet 1  . calcium-vitamin D (OSCAL WITH D) 500-200 MG-UNIT per tablet Take 1 tablet by mouth daily.    . carvedilol (COREG)  12.5 MG tablet Take 1 tablet (12.5 mg total) by mouth 2 (two) times daily with a meal. 60 tablet 11  . cholecalciferol (VITAMIN D) 1000 UNITS tablet Take 1,000 Units by mouth daily.    . cyanocobalamin 500 MCG tablet Take 500 mcg by mouth daily.    Marland Kitchen gabapentin (NEURONTIN) 300 MG capsule Take 1 capsule (300 mg total) by mouth 3 (three) times daily. 270 capsule 1  . glucose blood (ONETOUCH VERIO) test strip CHECK BLOOD SUGAR 3 TIMES DAILY DX Code E11.49 300 each 1  . hydrochlorothiazide (MICROZIDE) 12.5 MG capsule Take 1 capsule (12.5 mg total) by mouth daily. 30 capsule 11  . irbesartan (AVAPRO) 300 MG tablet TAKE 1 TABLET (300 MG TOTAL) BY MOUTH DAILY. 90 tablet 1  . LANTUS 100 UNIT/ML injection INJECT 0.34 MLS (34 UNITS TOTAL) INTO THE SKIN EVERY EVENING. 30 mL 1  . metFORMIN (GLUCOPHAGE-XR) 500 MG 24 hr tablet TAKE 2 TABLETS BY MOUTH DAILY WITH SUPPER. 60 tablet 3  . nitroGLYCERIN (NITROSTAT) 0.4 MG SL tablet Place 1 tablet (0.4 mg total) under the tongue every 5 (five) minutes as needed for chest pain. 25 tablet 3  . NOVOLOG 100 UNIT/ML injection INJECT 5 UNITS INTO THE SKIN 3 TIMES DAILY WITH  MEALS 20 mL 1  . ONETOUCH DELICA LANCETS 99991111 MISC USE TO CHECK BLOOD SUGARS 3 TIMES PER DAY 100 each 5  . spironolactone (ALDACTONE) 25 MG tablet TAKE 1 TABLET (25 MG TOTAL) BY MOUTH DAILY. 30 tablet 3  . VIAGRA 100 MG tablet TAKE 1 TABLET (100 MG TOTAL) BY MOUTH DAILY AS NEEDED FOR ERECTILE DYSFUNCTION. 90 tablet 1   No current facility-administered medications for this visit.    Allergies:   Review of patient's allergies indicates no known allergies.    Social History:  The patient  reports that he has never smoked. He has never used smokeless tobacco. He reports that he drinks about 1.2 oz of alcohol per week. He reports that he does not use illicit drugs.   Family History:  The patient's family history includes Diabetes in his mother; Hypertension in his father.    ROS:  Please see the history  of present illness.   Otherwise, review of systems are positive for increased fatigue and sleepiness. Left hip and knee pain..   All other systems are reviewed and negative.    PHYSICAL EXAM: VS:  BP 116/66 mmHg  Pulse 64  Ht 5\' 9"  (1.753 m)  Wt 203 lb 1.9 oz (92.135 kg)  BMI 29.98 kg/m2 , BMI Body mass index is 29.98 kg/(m^2). GEN: Well nourished, well developed, in no acute distress HEENT: normal Neck: no JVD, carotid bruits, or masses Cardiac: RRR.  There is no murmur, rub, or gallop. There is no edema. Respiratory:  clear to auscultation bilaterally, normal work of breathing. GI: soft, nontender, nondistended, + BS MS: no deformity or atrophy Skin: warm and dry, no rash Neuro:  Strength and sensation are intact Psych: euthymic mood, full affect   EKG:  EKG is ordered today. The ekg reveals normal sinus rhythm, first-degree AV block, left bundle branch block.   Recent Labs: 10/15/2014: Hemoglobin 13.0; Platelets 173.0 03/25/2015: ALT 10; BUN 16; Creatinine, Ser 1.17; Potassium 4.2; Sodium 139    Lipid Panel    Component Value Date/Time   CHOL 112 10/15/2014 0828   TRIG 46.0 10/15/2014 0828   HDL 35.70* 10/15/2014 0828   CHOLHDL 3 10/15/2014 0828   VLDL 9.2 10/15/2014 0828   LDLCALC 67 10/15/2014 0828   LDLDIRECT 79.0 12/24/2013 1048      Wt Readings from Last 3 Encounters:  06/06/15 203 lb 1.9 oz (92.135 kg)  03/28/15 206 lb 9.6 oz (93.713 kg)  12/26/14 202 lb (91.627 kg)      Other studies Reviewed: Additional studies/ records that were reviewed today include: Reviewed most recent laboratory data. The findings include within the past 4 months the BUN, creatinine, and potassium were checked and within the normal range.    ASSESSMENT AND PLAN:  1. Chronic combined systolic and diastolic heart failure (HCC) No evidence of volume overload  2. Essential hypertension Very good control  3. Left bundle branch block Unchanged  4. Angina pectoris  ([place]) Absent    Current medicines are reviewed at length with the patient today.  The patient has the following concerns regarding medicines: None.  The following changes/actions have been instituted:    Soft restricted and fluid restricted diet  Monitor daily weights  Call if chest pain or dyspnea.  Labs/ tests ordered today include:  No orders of the defined types were placed in this encounter.     Disposition:   FU with HS in 1 year  Signed, Patrick Grooms, MD  06/06/2015 9:11 AM  [facility] [address], [place], [place]  [address] Phone: [phone]; Fax: [phone]

## 2015-06-24 ENCOUNTER — Other Ambulatory Visit (INDEPENDENT_AMBULATORY_CARE_PROVIDER_SITE_OTHER): Payer: Medicare Other

## 2015-06-24 DIAGNOSIS — E119 Type 2 diabetes mellitus without complications: Secondary | ICD-10-CM

## 2015-06-24 DIAGNOSIS — E538 Deficiency of other specified B group vitamins: Secondary | ICD-10-CM | POA: Diagnosis not present

## 2015-06-24 LAB — COMPREHENSIVE METABOLIC PANEL
ALT: 8 U/L (ref 0–53)
AST: 12 U/L (ref 0–37)
Albumin: 4 g/dL (ref 3.5–5.2)
Alkaline Phosphatase: 105 U/L (ref 39–117)
BUN: 23 mg/dL (ref 6–23)
CALCIUM: 9.2 mg/dL (ref 8.4–10.5)
CHLORIDE: 103 meq/L (ref 96–112)
CO2: 27 meq/L (ref 19–32)
Creatinine, Ser: 1.28 mg/dL (ref 0.40–1.50)
GFR: 69.1 mL/min (ref >60.00)
Glucose, Bld: 161 mg/dL — ABNORMAL HIGH (ref 70–99)
Potassium: 4 mEq/L (ref 3.5–5.1)
Sodium: 137 mEq/L (ref 135–145)
Total Bilirubin: 0.5 mg/dL (ref 0.2–1.2)
Total Protein: 7 g/dL (ref 6.0–8.3)

## 2015-06-24 LAB — CBC
HEMATOCRIT: 37.7 % — AB (ref 39.0–52.0)
Hemoglobin: 12.6 g/dL — ABNORMAL LOW (ref 13.0–17.0)
MCHC: 33.3 g/dL (ref 30.0–36.0)
MCV: 94.3 fl (ref 78.0–100.0)
Platelets: 199 10*3/uL (ref 150.0–400.0)
RBC: 3.99 Mil/uL — AB (ref 4.22–5.81)
RDW: 14.4 % (ref 11.5–15.5)
WBC: 6.5 10*3/uL (ref 4.0–10.5)

## 2015-06-24 LAB — HEMOGLOBIN A1C: Hgb A1c MFr Bld: 8 % — ABNORMAL HIGH (ref 4.6–6.5)

## 2015-06-27 ENCOUNTER — Encounter: Payer: Self-pay | Admitting: Endocrinology

## 2015-06-27 ENCOUNTER — Ambulatory Visit (INDEPENDENT_AMBULATORY_CARE_PROVIDER_SITE_OTHER): Payer: Medicare Other | Admitting: Endocrinology

## 2015-06-27 VITALS — BP 132/70 | HR 71 | Temp 97.5°F | Resp 16 | Ht 69.0 in | Wt 206.8 lb

## 2015-06-27 DIAGNOSIS — Z794 Long term (current) use of insulin: Secondary | ICD-10-CM

## 2015-06-27 DIAGNOSIS — I10 Essential (primary) hypertension: Secondary | ICD-10-CM

## 2015-06-27 DIAGNOSIS — I209 Angina pectoris, unspecified: Secondary | ICD-10-CM

## 2015-06-27 DIAGNOSIS — E538 Deficiency of other specified B group vitamins: Secondary | ICD-10-CM | POA: Diagnosis not present

## 2015-06-27 DIAGNOSIS — E1165 Type 2 diabetes mellitus with hyperglycemia: Secondary | ICD-10-CM

## 2015-06-27 NOTE — Patient Instructions (Signed)
No lemonade and only 4 oz juice  Lantus 38 units  Reduce Novolog to 3 if eating 1/2 the meal  Check blood sugars on waking up 3-4  times a week Also check blood sugars about 2 hours after a meal and do this after different meals by rotation  Recommended blood sugar levels on waking up is 90-130 and about 2 hours after meal is 130-160  Please bring your blood sugar monitor to each visit, thank you

## 2015-06-27 NOTE — Progress Notes (Signed)
Patient ID: [name], male   DOB: 08/10/1930, 82 y.o.   MRN: [id number]   Reason for Appointment: follow-up of various problems   History of Present Illness   1.   Type 2 DIABETES MELITUS, long-standing     He has been on basal bolus regimen of insulin for several years with usually fair control His A1c generally is higher than expected for his home blood sugars Over the last couple of years because of weight loss he had required much less mealtime insulin  Recent history:  Insulin regimen: NovoLog 5 units a.c., Lantus 36 units at bedtime, uses syringes            His blood sugars have been overall progressively higher and A1c is now 8% He is generally compliant with his insulin including pre-meal doses On his last visit fasting readings are relatively high and Lantus was increased slightly  Current blood sugar patterns and problems identified:  Fasting readings have been higher and more consistently recently  However he does not appear to have any high readings after supper which he is monitoring periodically  He now says that he is either drinking juice or lemonade at breakfast time and sometimes at lunch  He does have significant variability in blood sugars at lunchtime possibly depending on what he is having to drink in the morning  Not eating a balanced meal in the evening and mostly eating hamburgers and sandwiches  Occasionally he will have a low sugar with eating smaller meal in the evening  Does not adjust his insulin based on his meal size  He has no side effects from low dose metformin   Oral hypoglycemic drugs:   metformin ER 0.5g bid        Side effects from medications: diarrhea with 1.5g metformin Proper timing of medications in relation to meals: Yes.          Monitors blood glucose: 2-3 times a day.    Glucometer:  One Touch         Blood Glucose readings from download:  Mean values apply above for all meters except median for One  Touch  PRE-MEAL Fasting Lunch Dinner Bedtime Overall  Glucose range:  117-228   76-292    58-157    Mean/median: 171  156    97  169    Meals: 3 meals per day.  Breakfast 8 AM, Lunch 1 pm, dinner 5 pm   Breakfast eggs, grits, juice; may have peanut butter crackers for snack at night but not large amounts of snacks         Physical activity: walking upto 30 minutes when weather is good      Wt Readings from Last 3 Encounters:  06/27/15 206 lb 12.8 oz (93.804 kg)  06/06/15 203 lb 1.9 oz (92.135 kg)  03/28/15 206 lb 9.6 oz (93.713 kg)   Lab Results  Component Value Date   HGBA1C 8.0* 06/24/2015   HGBA1C 7.5* 03/25/2015   HGBA1C 7.4* 10/15/2014   Lab Results  Component Value Date   MICROALBUR 35.3* 10/15/2014   LDLCALC 67 10/15/2014   CREATININE 1.28 06/24/2015            Medication List       This list is accurate as of: 06/27/15  9:00 AM.  Always use your most recent med list.               aspirin  81 MG chewable tablet  Chew 81 mg by mouth daily.     atorvastatin 40 MG tablet  Commonly known as:  LIPITOR  TAKE 1 TABLET (40 MG TOTAL) BY MOUTH DAILY.     calcium-vitamin D 500-200 MG-UNIT tablet  Commonly known as:  OSCAL WITH D  Take 1 tablet by mouth daily.     carvedilol 12.5 MG tablet  Commonly known as:  COREG  Take 1 tablet (12.5 mg total) by mouth 2 (two) times daily with a meal.     cholecalciferol 1000 units tablet  Commonly known as:  VITAMIN D  Take 1,000 Units by mouth daily.     cyanocobalamin 500 MCG tablet  Take 500 mcg by mouth daily.     gabapentin 300 MG capsule  Commonly known as:  NEURONTIN  Take 1 capsule (300 mg total) by mouth 3 (three) times daily.     glucose blood test strip  Commonly known as:  ONETOUCH VERIO  CHECK BLOOD SUGAR 3 TIMES DAILY DX Code E11.49     hydrochlorothiazide 12.5 MG capsule  Commonly known as:  MICROZIDE  Take 1 capsule (12.5 mg total) by mouth daily.     irbesartan 300 MG tablet  Commonly  known as:  AVAPRO  TAKE 1 TABLET (300 MG TOTAL) BY MOUTH DAILY.     LANTUS 100 UNIT/ML injection  Generic drug:  insulin glargine  INJECT 0.34 MLS (34 UNITS TOTAL) INTO THE SKIN EVERY EVENING.     metFORMIN 500 MG 24 hr tablet  Commonly known as:  GLUCOPHAGE-XR  TAKE 2 TABLETS BY MOUTH DAILY WITH SUPPER.     nitroGLYCERIN 0.4 MG SL tablet  Commonly known as:  NITROSTAT  Place 1 tablet (0.4 mg total) under the tongue every 5 (five) minutes as needed for chest pain.     NOVOLOG 100 UNIT/ML injection  Generic drug:  insulin aspart  INJECT 5 UNITS INTO THE SKIN 3 TIMES DAILY WITH MEALS     ONETOUCH DELICA LANCETS 99991111 Misc  USE TO CHECK BLOOD SUGARS 3 TIMES PER DAY     spironolactone 25 MG tablet  Commonly known as:  ALDACTONE  TAKE 1 TABLET (25 MG TOTAL) BY MOUTH DAILY.     VIAGRA 100 MG tablet  Generic drug:  sildenafil  TAKE 1 TABLET (100 MG TOTAL) BY MOUTH DAILY AS NEEDED FOR ERECTILE DYSFUNCTION.        Allergies: No Known Allergies  Past Medical History  Diagnosis Date  . Diabetes mellitus   . ED (erectile dysfunction)   . Hypertension   . Hyperlipidemia   . Pneumonia     hx of  . CHF (congestive heart failure) ([place])   . Urination frequency   . Cancer ([place])     hx of prostate ca  . Neuromuscular disorder ([facility])     numbness in hand/cervical issues  . Cataracts, bilateral     hx of  . Syncope     hx of    Past Surgical History  Procedure Laterality Date  . Hernia repair    . Tonsillectomy    . Eye surgery      cataract surgery bilateral  . Small intestine surgery      hx of  . Prostate surgery      s/p ca  . Posterior cervical fusion/foraminotomy  11/18/2011    Procedure: POSTERIOR CERVICAL FUSION/FORAMINOTOMY LEVEL 1;  Surgeon: Eustace Moore, MD;  Location: [facility];  Service: Neurosurgery;  Laterality:  Bilateral;    Family History  Problem Relation Age of Onset  . Diabetes Mother   . Hypertension Father     Social History:  reports that  he has never smoked. He has never used smokeless tobacco. He reports that he drinks about 1.2 oz of alcohol per week. He reports that he does not use illicit drugs.  Review of Systems:   HYPERTENSION:  this has been well controlled Currently on Coreg, Avapro 150 mg And HCTZ Also followed by cardiologist for history of CHF  Electrolytes and renal function as follows:  Lab Results  Component Value Date   CREATININE 1.28 06/24/2015   BUN 23 06/24/2015   NA 137 06/24/2015   K 4.0 06/24/2015   CL 103 06/24/2015   CO2 27 06/24/2015    He has had mild B-12 deficiency, continues to take B12 supplements as directed  Lab Results  Component Value Date   WBC 6.5 06/24/2015   HGB 12.6* 06/24/2015   HCT 37.7* 06/24/2015   MCV 94.3 06/24/2015   PLT 199.0 06/24/2015   No results found for: VITAMINB12   Hyperlipidemia: Well controlled  with Lipitor 40 mg, tends to have low HDL  Lab Results  Component Value Date   CHOL 112 10/15/2014   HDL 35.70* 10/15/2014   LDLCALC 67 10/15/2014   LDLDIRECT 79.0 12/24/2013   TRIG 46.0 10/15/2014   CHOLHDL 3 10/15/2014    NEUROPATHY:  Had symptoms of tingling controlled with gabapentin.   He is taking gabapentin up to 3 times a day  See last foot exam: 2/17   Examination:   BP 132/70 mmHg  Pulse 71  Temp(Src) 97.5 F (36.4 C)  Resp 16  Ht 5\' 9"  (1.753 m)  Wt 206 lb 12.8 oz (93.804 kg)  BMI 30.53 kg/m2  SpO2 98%  Body mass index is 30.53 kg/(m^2).    No pedal edema present  Diabetic Foot Exam - Simple   Simple Foot Form  Diabetic Foot exam was performed with the following findings:  Yes 06/27/2015  9:12 AM  Visual Inspection  No deformities, no ulcerations, no other skin breakdown bilaterally:  Yes  Sensation Testing  Intact to touch and monofilament testing bilaterally:  Yes  Pulse Check  See comments:  Yes  Comments  Absent pulses       ASSESSMENT/ PLAN:   Diabetes type 2   See history of present illness for detailed  discussion of his current management, blood sugar patterns and problems identified His blood sugars are overall higher with A1c now 8%  Again has high fasting readings and unclear why he is needing larger amount of basal insulin compared to minimal doses of mealtime Novolog He thinks he is measuring his insulin accurately on the syringe and is not very keen on switching to a pen Some of his high readings at lunchtime are related to drinking lemonade or juice in the morning specially when he is eating out in the morning He is checking blood sugars primarily before breakfast and lunchtime and only occasionally after supper Probably because of eating smaller meals sometimes he may get relatively low after supper  Recommendations:  Increase Lantus to 38  Stop drinking drinks which sugar in the morning  May use 3 units of Novolog at suppertime if eating a smaller meal  History of B12 deficiency: He has minimal decrease in hemoglobin, will follow  Hypertension:  good control of his blood pressure with 3 drugs with stable renal function and potassium  There are no Patient Instructions on file for this visit.     Elias Dennington 06/27/2015, 9:00 AM

## 2015-06-28 ENCOUNTER — Other Ambulatory Visit: Payer: Self-pay | Admitting: Interventional Cardiology

## 2015-07-11 ENCOUNTER — Telehealth: Payer: Self-pay | Admitting: Endocrinology

## 2015-07-11 ENCOUNTER — Other Ambulatory Visit: Payer: Self-pay | Admitting: *Deleted

## 2015-07-11 MED ORDER — "INSULIN SYRINGE-NEEDLE U-100 31G X 5/16"" 0.5 ML MISC": Status: DC

## 2015-07-11 NOTE — Telephone Encounter (Signed)
Pt daughter called and said PT needs needles called into [facility] on [address]

## 2015-07-11 NOTE — Telephone Encounter (Signed)
rx sent

## 2015-07-13 ENCOUNTER — Other Ambulatory Visit: Payer: Self-pay | Admitting: Endocrinology

## 2015-07-30 ENCOUNTER — Ambulatory Visit (INDEPENDENT_AMBULATORY_CARE_PROVIDER_SITE_OTHER): Payer: Medicare Other | Admitting: Podiatry

## 2015-07-30 ENCOUNTER — Encounter: Payer: Self-pay | Admitting: Podiatry

## 2015-07-30 DIAGNOSIS — B351 Tinea unguium: Secondary | ICD-10-CM | POA: Diagnosis not present

## 2015-07-30 DIAGNOSIS — M79675 Pain in left toe(s): Secondary | ICD-10-CM

## 2015-07-30 DIAGNOSIS — M79674 Pain in right toe(s): Secondary | ICD-10-CM | POA: Diagnosis not present

## 2015-07-30 NOTE — Patient Instructions (Signed)
Diabetes and Foot Care Diabetes may cause you to have problems because of poor blood supply (circulation) to your feet and legs. This may cause the skin on your feet to become thinner, break easier, and heal more slowly. Your skin may become dry, and the skin may peel and crack. You may also have nerve damage in your legs and feet causing decreased feeling in them. You may not notice minor injuries to your feet that could lead to infections or more serious problems. Taking care of your feet is one of the most important things you can do for yourself.  HOME CARE INSTRUCTIONS  Wear shoes at all times, even in the house. Do not go barefoot. Bare feet are easily injured.  Check your feet daily for blisters, cuts, and redness. If you cannot see the bottom of your feet, use a mirror or ask someone for help.  Wash your feet with warm water (do not use hot water) and mild soap. Then pat your feet and the areas between your toes until they are completely dry. Do not soak your feet as this can dry your skin.  Apply a moisturizing lotion or petroleum jelly (that does not contain alcohol and is unscented) to the skin on your feet and to dry, brittle toenails. Do not apply lotion between your toes.  Trim your toenails straight across. Do not dig under them or around the cuticle. File the edges of your nails with an emery board or nail file.  Do not cut corns or calluses or try to remove them with medicine.  Wear clean socks or stockings every day. Make sure they are not too tight. Do not wear knee-high stockings since they may decrease blood flow to your legs.  Wear shoes that fit properly and have enough cushioning. To break in new shoes, wear them for just a few hours a day. This prevents you from injuring your feet. Always look in your shoes before you put them on to be sure there are no objects inside.  Do not cross your legs. This may decrease the blood flow to your feet.  If you find a minor scrape,  cut, or break in the skin on your feet, keep it and the skin around it clean and dry. These areas may be cleansed with mild soap and water. Do not cleanse the area with peroxide, alcohol, or iodine.  When you remove an adhesive bandage, be sure not to damage the skin around it.  If you have a wound, look at it several times a day to make sure it is healing.  Do not use heating pads or hot water bottles. They may burn your skin. If you have lost feeling in your feet or legs, you may not know it is happening until it is too late.  Make sure your health care provider performs a complete foot exam at least annually or more often if you have foot problems. Report any cuts, sores, or bruises to your health care provider immediately. SEEK MEDICAL CARE IF:   You have an injury that is not healing.  You have cuts or breaks in the skin.  You have an ingrown nail.  You notice redness on your legs or feet.  You feel burning or tingling in your legs or feet.  You have pain or cramps in your legs and feet.  Your legs or feet are numb.  Your feet always feel cold. SEEK IMMEDIATE MEDICAL CARE IF:   There is increasing redness,   swelling, or pain in or around a wound.  There is a red line that goes up your leg.  Pus is coming from a wound.  You develop a fever or as directed by your health care provider.  You notice a bad smell coming from an ulcer or wound.   This information is not intended to replace advice given to you by your health care provider. Make sure you discuss any questions you have with your health care provider.   Document Released: 04/23/2000 Document Revised: 12/27/2012 Document Reviewed: 10/03/2012 Elsevier Interactive Patient Education 2016 [employer]  

## 2015-07-30 NOTE — Progress Notes (Signed)
Patient ID: [name], male   DOB: 1931-03-25, 82 y.o.   MRN: [id number]  Subjective: This patient presents for scheduled visit complaining of painful toenails and walking wearing shoes and is requesting toenail debridement  Objective: No open skin lesions bilaterally The toenails are elongated, brittle, incurvated, discolored and tender direct palpation 6-10  Assessment: Diabetic with a history of no complications Symptomatic onychomycoses 6-10  Plan: Debridement toenails 10 mechanically and electrically without any bleeding  Reappoint 4 months

## 2015-08-04 ENCOUNTER — Other Ambulatory Visit: Payer: Self-pay | Admitting: *Deleted

## 2015-08-04 MED ORDER — "INSULIN SYRINGE-NEEDLE U-100 30G X 1/2"" 0.5 ML MISC": Status: DC

## 2015-08-17 ENCOUNTER — Other Ambulatory Visit: Payer: Self-pay | Admitting: Endocrinology

## 2015-08-21 ENCOUNTER — Emergency Department (HOSPITAL_COMMUNITY)
Admission: EM | Admit: 2015-08-21 | Discharge: 2015-08-22 | Disposition: A | Discharge status: Home / Self Care | Payer: Medicare Other | Attending: Emergency Medicine | Admitting: Emergency Medicine

## 2015-08-21 ENCOUNTER — Encounter (HOSPITAL_COMMUNITY): Payer: Self-pay | Admitting: Emergency Medicine

## 2015-08-21 DIAGNOSIS — Z794 Long term (current) use of insulin: Secondary | ICD-10-CM | POA: Diagnosis not present

## 2015-08-21 DIAGNOSIS — Z7982 Long term (current) use of aspirin: Secondary | ICD-10-CM | POA: Diagnosis not present

## 2015-08-21 DIAGNOSIS — Z87438 Personal history of other diseases of male genital organs: Secondary | ICD-10-CM | POA: Insufficient documentation

## 2015-08-21 DIAGNOSIS — E11649 Type 2 diabetes mellitus with hypoglycemia without coma: Secondary | ICD-10-CM | POA: Diagnosis not present

## 2015-08-21 DIAGNOSIS — E162 Hypoglycemia, unspecified: Secondary | ICD-10-CM | POA: Diagnosis not present

## 2015-08-21 DIAGNOSIS — Z7984 Long term (current) use of oral hypoglycemic drugs: Secondary | ICD-10-CM | POA: Diagnosis not present

## 2015-08-21 DIAGNOSIS — Z79899 Other long term (current) drug therapy: Secondary | ICD-10-CM | POA: Diagnosis not present

## 2015-08-21 DIAGNOSIS — I509 Heart failure, unspecified: Secondary | ICD-10-CM | POA: Diagnosis not present

## 2015-08-21 DIAGNOSIS — R404 Transient alteration of awareness: Secondary | ICD-10-CM | POA: Diagnosis not present

## 2015-08-21 DIAGNOSIS — E785 Hyperlipidemia, unspecified: Secondary | ICD-10-CM | POA: Insufficient documentation

## 2015-08-21 DIAGNOSIS — Z8701 Personal history of pneumonia (recurrent): Secondary | ICD-10-CM | POA: Insufficient documentation

## 2015-08-21 DIAGNOSIS — Z8669 Personal history of other diseases of the nervous system and sense organs: Secondary | ICD-10-CM | POA: Diagnosis not present

## 2015-08-21 DIAGNOSIS — I10 Essential (primary) hypertension: Secondary | ICD-10-CM | POA: Insufficient documentation

## 2015-08-21 DIAGNOSIS — Z8546 Personal history of malignant neoplasm of prostate: Secondary | ICD-10-CM | POA: Insufficient documentation

## 2015-08-21 LAB — BASIC METABOLIC PANEL
ANION GAP: 15 (ref 5–15)
BUN: 38 mg/dL — ABNORMAL HIGH (ref 6–20)
CHLORIDE: 102 mmol/L (ref 101–111)
CO2: 18 mmol/L — AB (ref 22–32)
CREATININE: 1.89 mg/dL — AB (ref 0.61–1.24)
Calcium: 9 mg/dL (ref 8.9–10.3)
GFR calc Af Amer: 36 mL/min — ABNORMAL LOW (ref >60)
GFR calc non Af Amer: 31 mL/min — ABNORMAL LOW (ref >60)
Glucose, Bld: 88 mg/dL (ref 65–99)
Potassium: 4 mmol/L (ref 3.5–5.1)
SODIUM: 135 mmol/L (ref 135–145)

## 2015-08-21 LAB — URINALYSIS, ROUTINE W REFLEX MICROSCOPIC
GLUCOSE, UA: 100 mg/dL — AB
Hgb urine dipstick: NEGATIVE
Ketones, ur: 15 mg/dL — AB
LEUKOCYTES UA: NEGATIVE
Nitrite: NEGATIVE
PROTEIN: 100 mg/dL — AB
SPECIFIC GRAVITY, URINE: 1.021 (ref 1.005–1.030)
pH: 5 (ref 5.0–8.0)

## 2015-08-21 LAB — CBC
HCT: 36.9 % — ABNORMAL LOW (ref 39.0–52.0)
HEMOGLOBIN: 12.4 g/dL — AB (ref 13.0–17.0)
MCH: 30.5 pg (ref 26.0–34.0)
MCHC: 33.6 g/dL (ref 30.0–36.0)
MCV: 90.9 fL (ref 78.0–100.0)
PLATELETS: 168 10*3/uL (ref 150–400)
RBC: 4.06 MIL/uL — AB (ref 4.22–5.81)
RDW: 14.2 % (ref 11.5–15.5)
WBC: 11.4 10*3/uL — AB (ref 4.0–10.5)

## 2015-08-21 LAB — CBG MONITORING, ED
GLUCOSE-CAPILLARY: 218 mg/dL — AB (ref 65–99)
GLUCOSE-CAPILLARY: 241 mg/dL — AB (ref 65–99)

## 2015-08-21 LAB — URINE MICROSCOPIC-ADD ON

## 2015-08-21 MED ORDER — SODIUM CHLORIDE 0.9 % IV BOLUS (SEPSIS)
500.0000 mL | Freq: Once | INTRAVENOUS | Status: AC
  Administered 2015-08-22: 500 mL via INTRAVENOUS

## 2015-08-21 NOTE — ED Notes (Signed)
Per ems pt was found at home to be unresponsive with a cbg of 36. Pt was given 1/2 amp of d50 and cbg came was 138 on arrival to ED. Pt states," I have not eaten today because I didn't feel up to it, I did take my insulin but I don't remember when or how much." Pt is alert and and ox3.

## 2015-08-21 NOTE — ED Notes (Signed)
Pt CBG is 218. Nurse notified.

## 2015-08-21 NOTE — ED Provider Notes (Signed)
CSN: [id number]     Arrival date & time 08/21/15  2041 History   First MD Initiated Contact with Patient 08/21/15 2110     Chief Complaint  Patient presents with  . Hypoglycemia     (Consider location/radiation/quality/duration/timing/severity/associated sxs/prior Treatment) Patient is a 81 y.o. male presenting with hypoglycemia. The history is provided by the patient and medical records.  Hypoglycemia   82 year old male with history of diabetes, hypertension, hyperlipidemia, congestive heart failure, history of prostate cancer status post resection, presenting to the ED for hyperglycemia. Patient states he did not eat throughout the day today. He states he did not feel very hungry, he did however continue taking his insulin as normal. He states he is not sure of the exact dose of his insulin, he just took the amount he always takes.  He states his daughter had left the house to go get him a sandwich, but upon returning home found him unresponsive. EMS was called and his CBG was 36. Patient was given a half amp of D50 and CBG improved to 138. On arrival to ED patient is awake, alert, fully oriented. He is currently eating a sandwich and drinking juice. He states he feels fine. He denies any recent illness. No fever or chills. No chest pain or shortness of breath.  No cough.  No abdominal pain, nausea, vomiting, or diarrhea. He follows with endocrinology, Dr. Dwyane Dee.  Past Medical History  Diagnosis Date  . Diabetes mellitus   . ED (erectile dysfunction)   . Hypertension   . Hyperlipidemia   . Pneumonia     hx of  . CHF (congestive heart failure) ([place])   . Urination frequency   . Cancer ([place])     hx of prostate ca  . Neuromuscular disorder (H[facility])     numbness in hand/cervical issues  . Cataracts, bilateral     hx of  . Syncope     hx of   Past Surgical History  Procedure Laterality Date  . Hernia repair    . Tonsillectomy    . Eye surgery      cataract surgery bilateral  . Small  intestine surgery      hx of  . Prostate surgery      s/p ca  . Posterior cervical fusion/foraminotomy  11/18/2011    Procedure: POSTERIOR CERVICAL FUSION/FORAMINOTOMY LEVEL 1;  Surgeon: Eustace Moore, MD;  Location: [facility];  Service: Neurosurgery;  Laterality: Bilateral;   Family History  Problem Relation Age of Onset  . Diabetes Mother   . Hypertension Father    Social History  Substance Use Topics  . Smoking status: Never Smoker   . Smokeless tobacco: Never Used  . Alcohol Use: 1.2 oz/week    2 Shots of liquor per week    Review of Systems  Endocrine:       Hypoglycemia  All other systems reviewed and are negative.     Allergies  Review of patient's allergies indicates no known allergies.  Home Medications   Prior to Admission medications   Medication Sig Start Date End Date Taking? Authorizing Provider  aspirin 81 MG chewable tablet Chew 81 mg by mouth daily.   Yes Historical Provider, MD  atorvastatin (LIPITOR) 40 MG tablet TAKE 1 TABLET (40 MG TOTAL) BY MOUTH DAILY. 03/10/15  Yes Elayne Snare, MD  calcium-vitamin D (OSCAL WITH D) 500-200 MG-UNIT per tablet Take 1 tablet by mouth daily.   Yes Historical Provider, MD  carvedilol (COREG) 12.5 MG  tablet Take 1 tablet (12.5 mg total) by mouth 2 (two) times daily with a meal. 06/05/14  Yes Belva Crome, MD  cholecalciferol (VITAMIN D) 1000 UNITS tablet Take 1,000 Units by mouth daily.   Yes Historical Provider, MD  cyanocobalamin 500 MCG tablet Take 500 mcg by mouth daily.   Yes Historical Provider, MD  gabapentin (NEURONTIN) 300 MG capsule Take 1 capsule (300 mg total) by mouth 3 (three) times daily. 03/26/14  Yes Elayne Snare, MD  hydrochlorothiazide (MICROZIDE) 12.5 MG capsule TAKE 1 CAPSULE (12.5 MG TOTAL) BY MOUTH DAILY. 06/30/15  Yes Belva Crome, MD  irbesartan (AVAPRO) 300 MG tablet TAKE 1 TABLET (300 MG TOTAL) BY MOUTH DAILY. 10/17/14  Yes Elayne Snare, MD  LANTUS 100 UNIT/ML injection INJECT 0.34 MLS (34 UNITS  TOTAL) INTO THE SKIN EVERY EVENING. 10/01/14  Yes Elayne Snare, MD  metFORMIN (GLUCOPHAGE-XR) 500 MG 24 hr tablet TAKE 2 TABLETS BY MOUTH DAILY WITH SUPPER. 04/21/15  Yes Elayne Snare, MD  nitroGLYCERIN (NITROSTAT) 0.4 MG SL tablet Place 1 tablet (0.4 mg total) under the tongue every 5 (five) minutes as needed for chest pain. 06/05/14  Yes Belva Crome, MD  NOVOLOG 100 UNIT/ML injection INJECT 5 UNITS INTO THE SKIN 3 TIMES DAILY WITH MEALS 07/14/15  Yes Elayne Snare, MD  spironolactone (ALDACTONE) 25 MG tablet TAKE 1 TABLET (25 MG TOTAL) BY MOUTH DAILY. 08/18/15  Yes Elayne Snare, MD  VIAGRA 100 MG tablet TAKE 1 TABLET (100 MG TOTAL) BY MOUTH DAILY AS NEEDED FOR ERECTILE DYSFUNCTION. 01/22/15  Yes Elayne Snare, MD   BP 139/83 mmHg  Pulse 72  Temp(Src) 96.2 F (35.7 C) (Rectal)  Resp 20  Ht 5\' 10"  (1.778 m)  Wt 93.441 kg  BMI 29.56 kg/m2  SpO2 95%   Physical Exam  Constitutional: He is oriented to person, place, and time. He appears well-developed and well-nourished. No distress.  Awake, alert, eating [place] sandwich with orange juice  HENT:  Head: Normocephalic and atraumatic.  Mouth/Throat: Oropharynx is clear and moist.  Eyes: Conjunctivae and EOM are normal. Pupils are equal, round, and reactive to light.  Neck: Normal range of motion. Neck supple.  Cardiovascular: Normal rate, regular rhythm and normal heart sounds.   Pulmonary/Chest: Effort normal and breath sounds normal. No respiratory distress. He has no wheezes.  Abdominal: Soft. Bowel sounds are normal. There is no tenderness. There is no guarding.  Musculoskeletal: Normal range of motion.  Neurological: He is alert and oriented to person, place, and time.  AAOx3, answering questions and following commands appropriately; equal strength UE and LE bilaterally; CN grossly intact; moves all extremities appropriately without ataxia; no focal neuro deficits or facial asymmetry appreciated  Skin: Skin is warm and dry. He is not diaphoretic.   Psychiatric: He has a normal mood and affect.  Nursing note and vitals reviewed.   ED Course  Procedures (including critical care time) Labs Review Labs Reviewed  BASIC METABOLIC PANEL - Abnormal; Notable for the following:    CO2 18 (*)    BUN 38 (*)    Creatinine, Ser 1.89 (*)    GFR calc non Af Amer 31 (*)    GFR calc Af Amer 36 (*)    All other components within normal limits  CBC - Abnormal; Notable for the following:    WBC 11.4 (*)    RBC 4.06 (*)    Hemoglobin 12.4 (*)    HCT 36.9 (*)    All other components within  normal limits  URINALYSIS, ROUTINE W REFLEX MICROSCOPIC (NOT AT [facility]) - Abnormal; Notable for the following:    Color, Urine AMBER (*)    Glucose, UA 100 (*)    Bilirubin Urine SMALL (*)    Ketones, ur 15 (*)    Protein, ur 100 (*)    All other components within normal limits  URINE MICROSCOPIC-ADD ON - Abnormal; Notable for the following:    Squamous Epithelial / LPF 0-5 (*)    Bacteria, UA FEW (*)    Casts HYALINE CASTS (*)    All other components within normal limits  CBG MONITORING, ED - Abnormal; Notable for the following:    Glucose-Capillary 241 (*)    All other components within normal limits  CBG MONITORING, ED - Abnormal; Notable for the following:    Glucose-Capillary 218 (*)    All other components within normal limits  CBG MONITORING, ED    Imaging Review No results found. I have personally reviewed and evaluated these images and lab results as part of my medical decision-making.   EKG Interpretation   Date/Time:  Thursday August 21 2015 20:50:50 EDT Ventricular Rate:  70 PR Interval:  255 QRS Duration: 143 QT Interval:  451 QTC Calculation: 487 R Axis:   107 Text Interpretation:  Sinus rhythm Prolonged PR interval IVCD, consider  atypical LBBB no sig change from previous Confirmed by Johnney Killian, [place]  [phone]) on 08/21/2015 9:24:24 PM      MDM   Final diagnoses:  Hypoglycemia   82 year old male here with  hypoglycemia. He reports he did not eat today, but continued taking his diabetic medications as directed. Patient's initial CBG was 36 on EMS arrival. He was given half amp of D50 and CBG improved to 138. Blood sugar was 241 on arrival to ED. Patient is awake, alert, fully oriented. He is currently eating [place] sandwich and drinking orange juice. He is in no acute distress. He has no physical complaints at this time. Labwork was obtained, slight elevation of serum creatinine noted as well as elevation of BUN. Suspect component of dehydration given he has not eaten or had much fluids today. He was given a small fluid bolus given his history of CHF.  Patient was initially hypothermic at 96.62F, on recheck he is 97.15F orally without intervention.  He remains without complaints.  Has maintained his blood sugar, reports he continues to feel well.  VSS.  At this time, patient appears stable for discharge. I recommended that he follow-up with his PCP next week for recheck of his kidney function and close monitoring of his diabetes.  Continue daily CBG checks at home as scheduled, eat regular meals.  Discussed plan with patient, he/she acknowledged understanding and agreed with plan of care.  Return precautions given for new or worsening symptoms.  Case discussed with attending physician, Dr. Johnney Killian, who evaluated patient and agrees with assessment and plan of care.  Larene Pickett, PA-C 08/22/15 0050  Charlesetta Shanks, MD 08/30/15 [id number]

## 2015-08-22 DIAGNOSIS — E11649 Type 2 diabetes mellitus with hypoglycemia without coma: Secondary | ICD-10-CM | POA: Diagnosis not present

## 2015-08-22 DIAGNOSIS — R031 Nonspecific low blood-pressure reading: Secondary | ICD-10-CM | POA: Diagnosis not present

## 2015-08-22 NOTE — Discharge Instructions (Signed)
Continue your regular home medications. Please make sure to check your blood sugar regularly.  Make sure to eat regular meals as well. Follow-up with Dr. Dwyane Dee. Return here for new concerns.

## 2015-08-25 ENCOUNTER — Other Ambulatory Visit: Payer: Self-pay | Admitting: Endocrinology

## 2015-08-28 ENCOUNTER — Ambulatory Visit (INDEPENDENT_AMBULATORY_CARE_PROVIDER_SITE_OTHER): Payer: Medicare Other | Admitting: Endocrinology

## 2015-08-28 ENCOUNTER — Telehealth: Payer: Self-pay | Admitting: *Deleted

## 2015-08-28 VITALS — BP 90/50 | HR 61 | Temp 97.6°F | Resp 14 | Ht 69.0 in | Wt 201.8 lb

## 2015-08-28 DIAGNOSIS — R413 Other amnesia: Secondary | ICD-10-CM

## 2015-08-28 DIAGNOSIS — Z794 Long term (current) use of insulin: Secondary | ICD-10-CM

## 2015-08-28 DIAGNOSIS — I209 Angina pectoris, unspecified: Secondary | ICD-10-CM

## 2015-08-28 DIAGNOSIS — E538 Deficiency of other specified B group vitamins: Secondary | ICD-10-CM

## 2015-08-28 DIAGNOSIS — E1165 Type 2 diabetes mellitus with hyperglycemia: Secondary | ICD-10-CM | POA: Diagnosis not present

## 2015-08-28 DIAGNOSIS — T383X5A Adverse effect of insulin and oral hypoglycemic [antidiabetic] drugs, initial encounter: Secondary | ICD-10-CM

## 2015-08-28 DIAGNOSIS — N179 Acute kidney failure, unspecified: Secondary | ICD-10-CM | POA: Diagnosis not present

## 2015-08-28 DIAGNOSIS — E160 Drug-induced hypoglycemia without coma: Secondary | ICD-10-CM | POA: Diagnosis not present

## 2015-08-28 LAB — VITAMIN B12: Vitamin B-12: 830 pg/mL (ref 211–911)

## 2015-08-28 LAB — BASIC METABOLIC PANEL
BUN: 31 mg/dL — AB (ref 6–23)
CHLORIDE: 103 meq/L (ref 96–112)
CO2: 25 mEq/L (ref 19–32)
Calcium: 9.6 mg/dL (ref 8.4–10.5)
Creatinine, Ser: 1.24 mg/dL (ref 0.40–1.50)
GFR: 71.65 mL/min (ref >60.00)
GLUCOSE: 99 mg/dL (ref 70–99)
POTASSIUM: 4.8 meq/L (ref 3.5–5.1)
Sodium: 137 mEq/L (ref 135–145)

## 2015-08-28 NOTE — Telephone Encounter (Signed)
Patients daughter is requesting a letter so her father can get home health care.  She said you had written one before. Please advise.

## 2015-08-28 NOTE — Patient Instructions (Addendum)
INSULIN: Do the injection in the right outer leg or left outer stomach.  NOVOLOG insulin: Must take this within 5-10 minutes of your meal, not longer.  Stop drinking drinks with sugar, have only half a glass of juice at bedtime Drink more water  STOP taking irbesartan and hydrochlorothiazide  Check blood sugars on waking up at least every other day and some readings before lunch and dinner  Also check blood sugars about 2 hours after a meal and do this after different meals by rotation  Recommended blood sugar levels on waking up is 90-130 and about 2 hours after meal is 130-160  Please bring your blood sugar monitor to each visit, thank you

## 2015-08-28 NOTE — Progress Notes (Signed)
Patient ID: BRAVERY GABLE, male   DOB: 02-05-1931, 82 y.o.   MRN: [id number]   Reason for Appointment: follow-up of emergency room visit for hypoglycemia  History of Present Illness   1.   Type 2 DIABETES MELITUS, long-standing     He has been on basal bolus regimen of insulin for several years with usually fair control His A1c generally is higher than expected for his home blood sugars Over the last couple of years because of weight loss he had required much less mealtime insulin  Recent history:  Insulin regimen: NovoLog 5 units a.c., Lantus 36 units at bedtime, uses syringes            His blood sugars have been overall difficult to control, last A1c 8% He is generally compliant with his insulin including pre-meal doses On his last visit fasting readings are relatively high and Lantus was supposed to be increased to 38 but not clear if he is doing this He had an episode of HYPOGLYCEMIA on 4/13 apparently caused by his taking his Novolog at not getting his food for at least 30 minutes Glucose was 36  Current blood sugar patterns and problems identified:  Fasting readings have been mostly higher but inconsistent, lowest reading 65 without overnight hypoglycemia  His blood sugars are not usually higher at lunchtime but are not consistently either.  He had another low sugar at lunchtime yesterday, not clear whether this was being late for lunch or taking the insulin sometime before eating  Blood sugars are quite variable around suppertime and recently higher, he does not know why  Has only one reading after evening meal  He was told to cut back on drinks which sugar but is still drinking 2 glasses of juice in the morning and lemonade at other meals frequently  He now says that he is having trouble with the insulin syringe getting bent when he tries to inject but is getting a 30-gauge needle and is injecting consistently in his right abdomen only  He is frequently  eating fast food at breakfast  Does not adjust his insulin based on his meal size  He has no side effects from low dose metformin   Oral hypoglycemic drugs:   metformin ER 0.5g bid        Side effects from medications: diarrhea with 1.5g metformin Proper timing of medications in relation to meals: Yes.          Monitors blood glucose: 2-3 times a day.    Glucometer:  One Touch         Blood Glucose readings from download:  Mean values apply above for all meters except median for One Touch  PRE-MEAL Fasting Lunch Dinner Bedtime Overall  Glucose range: 65-271 52-360  84-425  295   Mean/median: 165  122  211   165    Meals: 3 meals per day.  Breakfast 8 AM, Lunch 1 pm, dinner 5 pm   Breakfast eggs, grits, juice; may have peanut butter crackers for snack at night          Physical activity: walking upto 30 minutes when weather is good      Wt Readings from Last 3 Encounters:  08/28/15 201 lb 12.8 oz (91.536 kg)  08/21/15 206 lb (93.441 kg)  06/27/15 206 lb 12.8 oz (93.804 kg)   Lab Results  Component Value Date   HGBA1C 8.0* 06/24/2015   HGBA1C 7.5* 03/25/2015  HGBA1C 7.4* 10/15/2014   Lab Results  Component Value Date   MICROALBUR 35.3* 10/15/2014   LDLCALC 67 10/15/2014   CREATININE 1.89* 08/21/2015            Medication List       This list is accurate as of: 08/28/15  9:33 AM.  Always use your most recent med list.               aspirin 81 MG chewable tablet  Chew 81 mg by mouth daily.     atorvastatin 40 MG tablet  Commonly known as:  LIPITOR  TAKE 1 TABLET (40 MG TOTAL) BY MOUTH DAILY.     B-D INS SYRINGE 0.5CC/30GX1/2" 30G X 1/2" 0.5 ML Misc  Generic drug:  Insulin Syringe-Needle U-100  USE 4 PER DAY TO INJECT INSULIN     calcium-vitamin D 500-200 MG-UNIT tablet  Commonly known as:  OSCAL WITH D  Take 1 tablet by mouth daily.     carvedilol 12.5 MG tablet  Commonly known as:  COREG  Take 1 tablet (12.5 mg total) by mouth 2 (two) times  daily with a meal.     cholecalciferol 1000 units tablet  Commonly known as:  VITAMIN D  Take 1,000 Units by mouth daily.     cyanocobalamin 500 MCG tablet  Take 500 mcg by mouth daily.     gabapentin 300 MG capsule  Commonly known as:  NEURONTIN  TAKE 1 CAPSULE (300 MG TOTAL) BY MOUTH 3 (THREE) TIMES DAILY.     hydrochlorothiazide 12.5 MG capsule  Commonly known as:  MICROZIDE  TAKE 1 CAPSULE (12.5 MG TOTAL) BY MOUTH DAILY.     irbesartan 300 MG tablet  Commonly known as:  AVAPRO  TAKE 1 TABLET (300 MG TOTAL) BY MOUTH DAILY.     LANTUS 100 UNIT/ML injection  Generic drug:  insulin glargine  INJECT 0.34 MLS (34 UNITS TOTAL) INTO THE SKIN EVERY EVENING.     metFORMIN 500 MG 24 hr tablet  Commonly known as:  GLUCOPHAGE-XR  TAKE 2 TABLETS BY MOUTH DAILY WITH SUPPER.     nitroGLYCERIN 0.4 MG SL tablet  Commonly known as:  NITROSTAT  Place 1 tablet (0.4 mg total) under the tongue every 5 (five) minutes as needed for chest pain.     NOVOLOG 100 UNIT/ML injection  Generic drug:  insulin aspart  INJECT 5 UNITS INTO THE SKIN 3 TIMES DAILY WITH MEALS     ONETOUCH VERIO test strip  Generic drug:  glucose blood  USE TO TEST 3 TIMES DAILY     spironolactone 25 MG tablet  Commonly known as:  ALDACTONE  TAKE 1 TABLET (25 MG TOTAL) BY MOUTH DAILY.     VIAGRA 100 MG tablet  Generic drug:  sildenafil  TAKE 1 TABLET (100 MG TOTAL) BY MOUTH DAILY AS NEEDED FOR ERECTILE DYSFUNCTION.        Allergies: No Known Allergies  Past Medical History  Diagnosis Date  . Diabetes mellitus   . ED (erectile dysfunction)   . Hypertension   . Hyperlipidemia   . Pneumonia     hx of  . CHF (congestive heart failure) ([place])   . Urination frequency   . Cancer ([place])     hx of prostate ca  . Neuromuscular disorder (HCC)     numbness in hand/cervical issues  . Cataracts, bilateral     hx of  . Syncope     hx of    Past  Surgical History  Procedure Laterality Date  . Hernia repair    .  Tonsillectomy    . Eye surgery      cataract surgery bilateral  . Small intestine surgery      hx of  . Prostate surgery      s/p ca  . Posterior cervical fusion/foraminotomy  11/18/2011    Procedure: POSTERIOR CERVICAL FUSION/FORAMINOTOMY LEVEL 1;  Surgeon: Eustace Moore, MD;  Location: [facility];  Service: Neurosurgery;  Laterality: Bilateral;    Family History  Problem Relation Age of Onset  . Diabetes Mother   . Hypertension Father     Social History:  reports that he has never smoked. He has never used smokeless tobacco. He reports that he drinks about 1.2 oz of alcohol per week. He reports that he does not use illicit drugs.  Review of Systems:   HYPERTENSION:  this has been well controlled previously.  Does not complain of lightheadedness but blood pressure is lower today and he does not monitor at home  Currently on Coreg, Avapro 150 mg and HCTZ, was seen by cardiologist recently Also followed by cardiologist for history of CHF  He had a higher creatinine in the emergency room, previously has been normal Electrolytes and renal function as follows:  Lab Results  Component Value Date   CREATININE 1.89* 08/21/2015   BUN 38* 08/21/2015   NA 135 08/21/2015   K 4.0 08/21/2015   CL 102 08/21/2015   CO2 18* 08/21/2015    He has had mild B-12 deficiency, continues to take B12 supplements as directed  Lab Results  Component Value Date   WBC 11.4* 08/21/2015   HGB 12.4* 08/21/2015   HCT 36.9* 08/21/2015   MCV 90.9 08/21/2015   PLT 168 08/21/2015   No results found for: VITAMINB12   Hyperlipidemia: Well controlled  with Lipitor 40 mg, tends to have low HDL  Lab Results  Component Value Date   CHOL 112 10/15/2014   HDL 35.70* 10/15/2014   LDLCALC 67 10/15/2014   LDLDIRECT 79.0 12/24/2013   TRIG 46.0 10/15/2014   CHOLHDL 3 10/15/2014    NEUROPATHY:  Had symptoms of tingling controlled with gabapentin.   He is taking gabapentin up to 3 times a day  See  last foot exam: 2/17  His daughter think that he is having more difficulty with memory   Examination:   BP 90/50 mmHg  Pulse 61  Temp(Src) 97.6 F (36.4 C)  Resp 14  Ht 5\' 9"  (1.753 m)  Wt 201 lb 12.8 oz (91.536 kg)  BMI 29.79 kg/m2  SpO2 99%  Body mass index is 29.79 kg/(m^2).    Blood pressure was lower standing up He is not giving consistent answers to questions, can remember what he had for evening meal last night Has some firm subcutaneous areas in the right abdomen.  He is doing his injections No pedal edema present   ASSESSMENT/ PLAN:   Diabetes type 2   See history of present illness for detailed discussion of his current management, blood sugar patterns and problems identified His blood sugars are more difficult to control and very labile Not clear if some of his high readings are from not getting his insulin injected properly because of his insulin needles getting bent, he is using the same site consistently in his abdomen  Again has high fasting readings and unclear why he is needing larger amount of basal insulin compared to minimal doses of mealtime Novolog He  thinks he is measuring his insulin accurately on the syringe and is not very keen on switching to a pen which was shown to him today May consider using a shorter needle Some of his high readings at lunchtime are related to drinking lemonade or juice in the morning specially when he is eating out in the morning He is checking blood sugars primarily before breakfast and lunchtime and only occasionally after supper Probably because of eating smaller meals sometimes he may get relatively low after supper  Recommendations as in patient instructions:  INSULIN: Do the injection in the right outer leg or left outer stomach.  NOVOLOG insulin: Must take this within 5-10 minutes of your meal, not longer.  Stop drinking drinks with sugar, have only half a glass of juice at bedtime Drink more water  STOP taking  irbesartan and hydrochlorothiazide  Keep a record of blood sugar and insulin doses in a diet that was given May need instructions from diabetes educator also  History of B12 deficiency: He has minimal decrease in hemoglobin, will recheck today  Hypertension:  His blood pressure is rather low and not clear why. With his renal insufficiency recently will stop his Avapro and HCTZ for the moment Follow-up next week  Memory difficulties: We will order a neurology consultation  Patient Instructions  INSULIN: Do the injection in the right outer leg or left outer stomach.  NOVOLOG insulin: Must take this within 5-10 minutes of your meal, not longer.  Stop drinking drinks with sugar, have only half a glass of juice at bedtime Drink more water  STOP taking irbesartan and hydrochlorothiazide  Check blood sugars on waking up at least every other day and some readings before lunch and dinner  Also check blood sugars about 2 hours after a meal and do this after different meals by rotation  Recommended blood sugar levels on waking up is 90-130 and about 2 hours after meal is 130-160  Please bring your blood sugar monitor to each visit, thank you     Counseling time on subjects discussed above is over 50% of today's 25 minute visit     Jaelynn Pozo 08/28/2015, 9:33 AM

## 2015-08-29 NOTE — Progress Notes (Signed)
Quick Note:  Please let patient know that the kidney test is better, can go back to metformin. ______

## 2015-09-02 ENCOUNTER — Encounter: Payer: Self-pay | Admitting: Endocrinology

## 2015-09-02 NOTE — Telephone Encounter (Signed)
Letter done

## 2015-09-05 ENCOUNTER — Ambulatory Visit (INDEPENDENT_AMBULATORY_CARE_PROVIDER_SITE_OTHER): Payer: Medicare Other | Admitting: Endocrinology

## 2015-09-05 ENCOUNTER — Encounter: Payer: Self-pay | Admitting: Endocrinology

## 2015-09-05 VITALS — BP 134/64 | HR 72 | Temp 97.6°F | Resp 14 | Ht 69.0 in | Wt 199.4 lb

## 2015-09-05 DIAGNOSIS — I951 Orthostatic hypotension: Secondary | ICD-10-CM

## 2015-09-05 DIAGNOSIS — E1165 Type 2 diabetes mellitus with hyperglycemia: Secondary | ICD-10-CM

## 2015-09-05 DIAGNOSIS — I209 Angina pectoris, unspecified: Secondary | ICD-10-CM | POA: Diagnosis not present

## 2015-09-05 DIAGNOSIS — Z794 Long term (current) use of insulin: Secondary | ICD-10-CM | POA: Diagnosis not present

## 2015-09-05 NOTE — Progress Notes (Signed)
Patient ID: [name], male   DOB: 05-28-1930, 82 y.o.   MRN: [id number]   Reason for Appointment:  Follow-up  History of Present Illness   1.   Type 2 DIABETES MELITUS, long-standing     He has been on basal bolus regimen of insulin for several years with usually fair control His A1c generally is higher than expected for his home blood sugars Over the last couple of years because of weight loss he had required much less mealtime insulin  Recent history:  Insulin regimen: NovoLog 5 units a.c., Lantus 36 units at bedtime, uses syringes            His blood sugars have been overall difficult to control, last A1c 8% He was seen a week ago in follow-up of an episode of hypoglycemia  Current blood sugar patterns and problems identified:  He was told to make sure he is taking his Novolog right before eating instead of 30 minutes before and he is doing this  His blood sugars have improved considerably in the last few days especially this week, previously were frequently high and inconsistent  FASTING blood sugars are relatively better without any change in Lantus.  He has had only one episode of mild hyperglycemia at 5 PM on Wednesday possibly because of missing lunch.  Probably does not have a snack if he skips a meal.  He has been compliant with checking his blood sugars at least 4 times a day on an average now  He has no side effects from low dose metformin  He is also injecting his insulin in his right thigh instead of continuing to inject in the right abdomen where he was having some skin changes   Oral hypoglycemic drugs:   metformin ER 0.5g bid        Side effects from medications: diarrhea with 1.5g metformin Proper timing of medications in relation to meals: Yes.          Monitors blood glucose:  4 times a day.    Glucometer:  One Touch         Blood Glucose readings from download recently:  Mean values apply above for all meters except median for One  Touch  PRE-MEAL Fasting Lunch Dinner Bedtime Overall  Glucose range: 120-227  103-167  51-207  156, 242    Mean/median:     185     Meals: 3 meals per day.  Breakfast 8 AM, Lunch 1 pm, dinner 5 pm   Breakfast eggs, grits, juice; may have peanut butter crackers for snack at night          Physical activity: walking upto 30 minutes when weather is good      Wt Readings from Last 3 Encounters:  09/05/15 199 lb 6.4 oz (90.447 kg)  08/28/15 201 lb 12.8 oz (91.536 kg)  08/21/15 206 lb (93.441 kg)   Lab Results  Component Value Date   HGBA1C 8.0* 06/24/2015   HGBA1C 7.5* 03/25/2015   HGBA1C 7.4* 10/15/2014   Lab Results  Component Value Date   MICROALBUR 35.3* 10/15/2014   LDLCALC 67 10/15/2014   CREATININE 1.24 08/28/2015            Medication List       This list is accurate as of: 09/05/15 12:34 PM.  Always use your most recent med list.  aspirin 81 MG chewable tablet  Chew 81 mg by mouth daily.     atorvastatin 40 MG tablet  Commonly known as:  LIPITOR  TAKE 1 TABLET (40 MG TOTAL) BY MOUTH DAILY.     B-D INS SYRINGE 0.5CC/30GX1/2" 30G X 1/2" 0.5 ML Misc  Generic drug:  Insulin Syringe-Needle U-100  USE 4 PER DAY TO INJECT INSULIN     calcium-vitamin D 500-200 MG-UNIT tablet  Commonly known as:  OSCAL WITH D  Take 1 tablet by mouth daily.     carvedilol 12.5 MG tablet  Commonly known as:  COREG  Take 1 tablet (12.5 mg total) by mouth 2 (two) times daily with a meal.     cholecalciferol 1000 units tablet  Commonly known as:  VITAMIN D  Take 1,000 Units by mouth daily.     cyanocobalamin 500 MCG tablet  Take 500 mcg by mouth daily.     gabapentin 300 MG capsule  Commonly known as:  NEURONTIN  TAKE 1 CAPSULE (300 MG TOTAL) BY MOUTH 3 (THREE) TIMES DAILY.     hydrochlorothiazide 12.5 MG capsule  Commonly known as:  MICROZIDE  TAKE 1 CAPSULE (12.5 MG TOTAL) BY MOUTH DAILY.     irbesartan 300 MG tablet  Commonly known as:  AVAPRO    TAKE 1 TABLET (300 MG TOTAL) BY MOUTH DAILY.     LANTUS 100 UNIT/ML injection  Generic drug:  insulin glargine  INJECT 0.34 MLS (34 UNITS TOTAL) INTO THE SKIN EVERY EVENING.     metFORMIN 500 MG 24 hr tablet  Commonly known as:  GLUCOPHAGE-XR  TAKE 2 TABLETS BY MOUTH DAILY WITH SUPPER.     nitroGLYCERIN 0.4 MG SL tablet  Commonly known as:  NITROSTAT  Place 1 tablet (0.4 mg total) under the tongue every 5 (five) minutes as needed for chest pain.     NOVOLOG 100 UNIT/ML injection  Generic drug:  insulin aspart  INJECT 5 UNITS INTO THE SKIN 3 TIMES DAILY WITH MEALS     ONETOUCH VERIO test strip  Generic drug:  glucose blood  USE TO TEST 3 TIMES DAILY     spironolactone 25 MG tablet  Commonly known as:  ALDACTONE  TAKE 1 TABLET (25 MG TOTAL) BY MOUTH DAILY.     VIAGRA 100 MG tablet  Generic drug:  sildenafil  TAKE 1 TABLET (100 MG TOTAL) BY MOUTH DAILY AS NEEDED FOR ERECTILE DYSFUNCTION.        Allergies: No Known Allergies  Past Medical History  Diagnosis Date  . Diabetes mellitus   . ED (erectile dysfunction)   . Hypertension   . Hyperlipidemia   . Pneumonia     hx of  . CHF (congestive heart failure) ([place])   . Urination frequency   . Cancer ([place])     hx of prostate ca  . Neuromuscular disorder (HCC)     numbness in hand/cervical issues  . Cataracts, bilateral     hx of  . Syncope     hx of    Past Surgical History  Procedure Laterality Date  . Hernia repair    . Tonsillectomy    . Eye surgery      cataract surgery bilateral  . Small intestine surgery      hx of  . Prostate surgery      s/p ca  . Posterior cervical fusion/foraminotomy  11/18/2011    Procedure: POSTERIOR CERVICAL FUSION/FORAMINOTOMY LEVEL 1;  Surgeon: Eustace Moore, MD;  Location:  [facility];  Service: Neurosurgery;  Laterality: Bilateral;    Family History  Problem Relation Age of Onset  . Diabetes Mother   . Hypertension Father     Social History:  reports that he has  never smoked. He has never used smokeless tobacco. He reports that he drinks about 1.2 oz of alcohol per week. He reports that he does not use illicit drugs.  Review of Systems:   HYPERTENSION:  this has been well controlled previously.  Does not complain of lightheadedness butBecause blood pressure was relatively low on the last visit his HCTZ and Avapro were stopped..  Currently on Coreg and low-dose Aldactone from cardiologist Also followed by cardiologist for history of CHF    Electrolytes and renal function as follows:  Lab Results  Component Value Date   CREATININE 1.24 08/28/2015   BUN 31* 08/28/2015   NA 137 08/28/2015   K 4.8 08/28/2015   CL 103 08/28/2015   CO2 25 08/28/2015    He has had mild B-12 deficiency, continues to take B12 supplements as directed, His last level is normal  Lab Results  Component Value Date   WBC 11.4* 08/21/2015   HGB 12.4* 08/21/2015   HCT 36.9* 08/21/2015   MCV 90.9 08/21/2015   PLT 168 08/21/2015   Lab Results  Component Value Date   VITAMINB12 830 08/28/2015     Hyperlipidemia: Well controlled  with Lipitor 40 mg, tends to have low HDL  Lab Results  Component Value Date   CHOL 112 10/15/2014   HDL 35.70* 10/15/2014   LDLCALC 67 10/15/2014   LDLDIRECT 79.0 12/24/2013   TRIG 46.0 10/15/2014   CHOLHDL 3 10/15/2014    NEUROPATHY:  Had symptoms of tingling controlled with gabapentin.   He is taking gabapentin up to 3 times a day  See last foot exam: 2/17  He is scheduled to see the neurologist for decreased memory   Examination:   BP 134/64 mmHg  Pulse 72  Temp(Src) 97.6 F (36.4 C)  Resp 14  Ht 5\' 9"  (1.753 m)  Wt 199 lb 6.4 oz (90.447 kg)  BMI 29.43 kg/m2  SpO2 98%  Body mass index is 29.43 kg/(m^2).    Blood pressure was  110/62 standing up He appears more alert today  ASSESSMENT/ PLAN:   Diabetes type 2   See history of present illness for detailed discussion of his current management, blood sugar  patterns and problems identified His blood sugars are improving somewhat since his last visit He is having fairly good fasting readings but since his sugar was 51 at suppertime with not eating on time will reduce his Lantus by 2 units Continue same dose of Novolog  Hypertension:  His blood pressure is still low normal especially on standing up Will reduce his Coreg to 12.5 mg, half tablet in the morning and continue 12 point 5 in the evening       Patient Instructions  Have a snack if missing a meal  Lantus 34 units in pm  Cut am Carvedilol in 1/2         [place] 09/05/2015, 12:34 PM

## 2015-09-05 NOTE — Patient Instructions (Addendum)
Have a snack if missing a meal  Lantus 34 units in pm  Cut am Carvedilol in 1/2

## 2015-09-09 ENCOUNTER — Other Ambulatory Visit: Payer: Self-pay | Admitting: Endocrinology

## 2015-09-11 ENCOUNTER — Ambulatory Visit: Payer: Medicare Other | Admitting: Neurology

## 2015-09-16 ENCOUNTER — Telehealth: Payer: Self-pay | Admitting: Endocrinology

## 2015-09-16 NOTE — Telephone Encounter (Signed)
Please see below and advise.

## 2015-09-16 NOTE — Telephone Encounter (Signed)
Message left for patient to return call.

## 2015-09-16 NOTE — Telephone Encounter (Signed)
Please find out why he is asking

## 2015-09-16 NOTE — Telephone Encounter (Signed)
PT wants to know if he can go back on the Hydrochlorothiazide

## 2015-09-17 NOTE — Telephone Encounter (Signed)
Prefer to start Lasix 20mg  daily

## 2015-09-17 NOTE — Telephone Encounter (Signed)
Patient said both feet are very swollen, that's why he wants to start taking the HCTZ. Please advise

## 2015-09-17 NOTE — Telephone Encounter (Signed)
Patient daughter ask if it is ok for patient to start back taking the hydrochlorothiazide (MICROZIDE) 12.5 MG capsule, please advise

## 2015-09-17 NOTE — Telephone Encounter (Signed)
Patient returning your call and ask you to call him.

## 2015-09-18 ENCOUNTER — Other Ambulatory Visit: Payer: Self-pay | Admitting: *Deleted

## 2015-09-18 MED ORDER — FUROSEMIDE 20 MG PO TABS: 20.0000 mg | ORAL_TABLET | Freq: Every day | ORAL | Status: DC

## 2015-09-18 NOTE — Telephone Encounter (Signed)
Noted, patient is aware rx has been sent.

## 2015-09-22 ENCOUNTER — Other Ambulatory Visit: Payer: Medicare Other

## 2015-09-26 ENCOUNTER — Ambulatory Visit: Payer: Medicare Other | Admitting: Endocrinology

## 2015-09-26 ENCOUNTER — Telehealth: Payer: Self-pay | Admitting: Endocrinology

## 2015-09-26 NOTE — Telephone Encounter (Signed)
See note below and please advise, Thanks! 

## 2015-09-26 NOTE — Telephone Encounter (Signed)
PT daughter called and said that PT is very constipated and uncomfortable and would like to know if there is something that can be called in for him.

## 2015-09-26 NOTE — Telephone Encounter (Signed)
Recommend trying OTC MiraLAX first. Is he taking any OTC iron pills

## 2015-09-26 NOTE — Telephone Encounter (Signed)
I contacted the pt's daughter and advised of note below. She stated the pt is not taking any iron supplements at this time.

## 2015-09-28 ENCOUNTER — Other Ambulatory Visit: Payer: Self-pay | Admitting: Endocrinology

## 2015-10-02 ENCOUNTER — Encounter: Payer: Self-pay | Admitting: Neurology

## 2015-10-02 ENCOUNTER — Ambulatory Visit (INDEPENDENT_AMBULATORY_CARE_PROVIDER_SITE_OTHER): Payer: Medicare Other | Admitting: Neurology

## 2015-10-02 VITALS — BP 90/58 | HR 78 | Ht 69.0 in | Wt 191.0 lb

## 2015-10-02 DIAGNOSIS — I209 Angina pectoris, unspecified: Secondary | ICD-10-CM | POA: Diagnosis not present

## 2015-10-02 DIAGNOSIS — G301 Alzheimer's disease with late onset: Secondary | ICD-10-CM | POA: Diagnosis not present

## 2015-10-02 DIAGNOSIS — F0280 Dementia in other diseases classified elsewhere without behavioral disturbance: Secondary | ICD-10-CM | POA: Diagnosis not present

## 2015-10-02 NOTE — Progress Notes (Signed)
The patient is seen in neurologic consultation at the request of [facility], MD for the evaluation of memory.  The patient is accompanied by his daughter who supplements the history.  The patient is a 82 y.o. year old male who has had memory issues for about 1-2 years, but pt thinks that it has only been going on for 1-2 months.  His daughter states that he doesn't remember conversations.  Pts daughter lives with the patient.  States that she has always lived with her father.    The patient does do the finances in the home and he remembers to pay the bills and his daughter states that he knows the days that they arrive and he is waiting for them.  The patient drives and his daughter doesn't drive with him.  States that he does most of his own driving and she drives him very little.  There have not been any motor vehicle accidents in the recent years.  The patient does not cook.  Daughter states that she will cook a few times a week and then he eats fast food.    The patient is his able to perform his own ADL's.  The patient is able to distribute his own medications.  He has no pill box and just uses the pharmacy bottles.  The patients bladder and bowel are under good control.  There have been no behavioral changes over the years.  There have been no hallucinations.  Last CT head was in 2008.    No Known Allergies  Current Outpatient Prescriptions on File Prior to Visit  Medication Sig Dispense Refill  . aspirin 81 MG chewable tablet Chew 81 mg by mouth daily.    Marland Kitchen atorvastatin (LIPITOR) 40 MG tablet TAKE 1 TABLET (40 MG TOTAL) BY MOUTH DAILY. 90 tablet 1  . B-D INS SYRINGE 0.5CC/30GX1/2" 30G X 1/2" 0.5 ML MISC USE 4 PER DAY TO INJECT INSULIN  3  . calcium-vitamin D (OSCAL WITH D) 500-200 MG-UNIT per tablet Take 1 tablet by mouth daily.    . carvedilol (COREG) 12.5 MG tablet Take 1 tablet (12.5 mg total) by mouth 2 (two) times daily with a meal. 60 tablet 11  . cholecalciferol (VITAMIN D) 1000  UNITS tablet Take 1,000 Units by mouth daily.    . cyanocobalamin 500 MCG tablet Take 500 mcg by mouth daily.    . furosemide (LASIX) 20 MG tablet Take 1 tablet (20 mg total) by mouth daily. 30 tablet 3  . gabapentin (NEURONTIN) 300 MG capsule TAKE 1 CAPSULE (300 MG TOTAL) BY MOUTH 3 (THREE) TIMES DAILY. 270 capsule 1  . hydrochlorothiazide (MICROZIDE) 12.5 MG capsule TAKE 1 CAPSULE (12.5 MG TOTAL) BY MOUTH DAILY. 30 capsule 10  . irbesartan (AVAPRO) 300 MG tablet TAKE 1 TABLET (300 MG TOTAL) BY MOUTH DAILY. 90 tablet 1  . LANTUS 100 UNIT/ML injection INJECT 0.34 MLS (34 UNITS TOTAL) INTO THE SKIN EVERY EVENING. 30 mL 1  . metFORMIN (GLUCOPHAGE-XR) 500 MG 24 hr tablet TAKE 2 TABLETS BY MOUTH DAILY WITH SUPPER. 60 tablet 3  . NOVOLOG 100 UNIT/ML injection INJECT 5 UNITS INTO THE SKIN 3 TIMES DAILY WITH MEALS 20 mL 1  . ONETOUCH VERIO test strip USE TO TEST 3 TIMES DAILY  1  . spironolactone (ALDACTONE) 25 MG tablet TAKE 1 TABLET (25 MG TOTAL) BY MOUTH DAILY. 30 tablet 3  . VIAGRA 100 MG tablet TAKE 1 TABLET (100 MG TOTAL) BY MOUTH DAILY AS NEEDED FOR ERECTILE DYSFUNCTION.  90 tablet 1  . nitroGLYCERIN (NITROSTAT) 0.4 MG SL tablet Place 1 tablet (0.4 mg total) under the tongue every 5 (five) minutes as needed for chest pain. (Patient not taking: Reported on 10/02/2015) 25 tablet 3   No current facility-administered medications on file prior to visit.    Past Medical History  Diagnosis Date  . Diabetes mellitus   . ED (erectile dysfunction)   . Hypertension   . Hyperlipidemia   . Pneumonia     hx of  . CHF (congestive heart failure) ([place])   . Urination frequency   . Cancer ([place])     hx of prostate ca  . Neuromuscular disorder (HCC)     numbness in hand/cervical issues  . Cataracts, bilateral     hx of  . Syncope     hx of    Past Surgical History  Procedure Laterality Date  . Hernia repair    . Tonsillectomy    . Eye surgery      cataract surgery bilateral  . Small intestine  surgery      hx of  . Prostate surgery      s/p ca  . Posterior cervical fusion/foraminotomy  11/18/2011    Procedure: POSTERIOR CERVICAL FUSION/FORAMINOTOMY LEVEL 1;  Surgeon: Eustace Moore, MD;  Location: [place] [facility];  Service: Neurosurgery;  Laterality: Bilateral;    Social History   Social History  . Marital Status: Widowed    Spouse Name: N/A  . Number of Children: N/A  . Years of Education: N/A   Occupational History  . retired     Actuary   Social History Main Topics  . Smoking status: Never Smoker   . Smokeless tobacco: Never Used  . Alcohol Use: No     Comment: daughter states that used to be "heavy weekend drinker" but quit 10 + years ago  . Drug Use: No  . Sexual Activity: Not Currently   Other Topics Concern  . Not on file   Social History Narrative    Family Status  Relation Status Death Age  . Mother Deceased     DM  . Father Deceased     HTN  . Maternal Grandmother Deceased     alzheimers  . Brother Alive     heart  . Brother Deceased     DM, HTN  . Sister Deceased     DM, HTN  . Daughter Alive     healthy  . Son Deceased 4    MI    ROS:  No lateralizing weakness or paresthesias.  Bilateral hand paresthesias that daughter states is "carpal tunnel."  Has feet paresthesias.  A complete 10 system ROS was obtained and was unremarkable except as above.   VITALS:   Filed Vitals:   10/02/15 0847  BP: 90/58  Pulse: 78  Height: 5\' 9"  (1.753 m)  Weight: 191 lb (86.637 kg)   HEENT:  Normocephalic, atraumatic. The mucous membranes are moist. The superficial temporal arteries are without ropiness or tenderness. Cardiovascular: Regular rate and rhythm. Lungs: Clear to auscultation bilaterally. Neck: There are no carotid bruits noted bilaterally.  NEUROLOGICAL:  Orientation:   Montreal Cognitive Assessment  10/02/2015  Visuospatial/ Executive (0/5) 1  Naming (0/3) 2  Attention: Read list of digits (0/2) 2  Attention:  Read list of letters (0/1) 1  Attention: Serial 7 subtraction starting at 100 (0/3) 0  Language: Repeat phrase (0/2) 2  Language : Fluency (0/1) 0  Abstraction (  0/2) 2  Delayed Recall (0/5) 2  Orientation (0/6) 5  Total 17  Adjusted Score (based on education) 18   Cranial nerves: There is good facial symmetry. The pupils are equal round and non-reactive to light bilaterally.Fundoscopic exam is attempted but the disc margins are not well visualized bilaterally.  Extraocular muscles are intact and visual fields are full to confrontational testing. Speech is fluent and clear. Soft palate rises symmetrically and there is no tongue deviation. Hearing is intact to conversational tone. Tone: Tone is good throughout. Sensation: Sensation is intact to light touch and pinprick throughout. Vibration is decreased distally.  There is no extinction with double simultaneous stimulation. There is no sensory dermatomal level identified. Coordination:  The patient has no difficulty with RAM's or FNF bilaterally. Motor: Strength is 5/5 in the bilateral upper and lower extremities.   There is wasting of the R>L APB.  There is no pronator drift.  There are no fasciculations noted. DTR's: Deep tendon reflexes are 2/4 at the bilateral biceps, triceps, brachioradialis, patella and absent at the bilateral achilles.  Plantar responses are downgoing bilaterally. Gait and Station: The patient is able to ambulate without difficulty with his cane, otherwise he is unsteady.  Cannot ambulate in tandem fashion unless uses the cane to lean on.  LABS  Lab Results  Component Value Date   VITAMINB12 830 08/28/2015     Chemistry      Component Value Date/Time   NA 137 08/28/2015 0848   K 4.8 08/28/2015 0848   CL 103 08/28/2015 0848   CO2 25 08/28/2015 0848   BUN 31* 08/28/2015 0848   CREATININE 1.24 08/28/2015 0848      Component Value Date/Time   CALCIUM 9.6 08/28/2015 0848   ALKPHOS 105 06/24/2015 0820   AST 12  06/24/2015 0820   ALT 8 06/24/2015 0820   BILITOT 0.5 06/24/2015 0820     Lab Results  Component Value Date   HGBA1C 8.0* 06/24/2015     IMPRESSIONS/PLAN:  Memory Loss  -Long discussion with the patient and family today.  The patients constellation of symptoms along with physical examination findings strongly favors a diagnosis of dementia, perhaps vascular but Alzheimers is high in Ddx.  We discussed diagnosis, pathophysiology and prognosis.  We discussed community resources for patient and caregiver.    -recommended neurocognitive testing.  He refused  -recommended MRI brain.  He refused.  -recommended driving eval. He refused.  Told him if he didn't do that, I recommend no driving at all.  Pt was very unhappy and that is when he refused all testing.  -recommended that his medication administration be monitored closely if not given to him.  Talked about strategies to do this.  -We discussed medications and the fact that they do not prevent memory loss nor do they halt it.  We discussed expectations with memory medications but I wanted to hold on this until testing completed, as the patient definitely needed "proof" of memory dysfunction.  Opted to hold on med  -We discussed the importance of physical and mental exercises.  -pt will let me know if he changes his mind and I will be happy to schedule the above or see him back.  Much greater than 50% of this visit was spent in counseling and coordinating care.  Total face to face time:  60 min

## 2015-10-02 NOTE — Patient Instructions (Addendum)
1. If you are interested in the driving assessment, you can contact [facility] [employer] in [place]. [phone]. If you do not complete a driving evaluation we recommend that you do not drive. 2. Please let us know if you would like [place] to order an MRI of your brain or memory testing in our office. We recommend this testing for you.  3. Medications should be monitored daily.  4. Follow up as needed.

## 2015-10-16 ENCOUNTER — Other Ambulatory Visit (INDEPENDENT_AMBULATORY_CARE_PROVIDER_SITE_OTHER): Payer: Medicare Other

## 2015-10-16 ENCOUNTER — Other Ambulatory Visit: Payer: Medicare Other

## 2015-10-16 DIAGNOSIS — Z794 Long term (current) use of insulin: Secondary | ICD-10-CM

## 2015-10-16 DIAGNOSIS — E1165 Type 2 diabetes mellitus with hyperglycemia: Secondary | ICD-10-CM

## 2015-10-16 DIAGNOSIS — E538 Deficiency of other specified B group vitamins: Secondary | ICD-10-CM | POA: Diagnosis not present

## 2015-10-16 LAB — LIPID PANEL
CHOLESTEROL: 120 mg/dL (ref 0–200)
HDL: 35.3 mg/dL — ABNORMAL LOW (ref >39.00)
LDL CALC: 72 mg/dL (ref 0–99)
NonHDL: 84.41
TRIGLYCERIDES: 63 mg/dL (ref 0.0–149.0)
Total CHOL/HDL Ratio: 3
VLDL: 12.6 mg/dL (ref 0.0–40.0)

## 2015-10-16 LAB — CBC
HEMATOCRIT: 36.9 % — AB (ref 39.0–52.0)
Hemoglobin: 12.3 g/dL — ABNORMAL LOW (ref 13.0–17.0)
MCHC: 33.2 g/dL (ref 30.0–36.0)
MCV: 95.5 fl (ref 78.0–100.0)
PLATELETS: 173 10*3/uL (ref 150.0–400.0)
RBC: 3.86 Mil/uL — AB (ref 4.22–5.81)
RDW: 15.9 % — ABNORMAL HIGH (ref 11.5–15.5)
WBC: 6.4 10*3/uL (ref 4.0–10.5)

## 2015-10-16 LAB — POCT URINALYSIS DIPSTICK
Bilirubin, UA: NEGATIVE
GLUCOSE UA: NEGATIVE
Ketones, UA: NEGATIVE
Leukocytes, UA: NEGATIVE
NITRITE UA: NEGATIVE
Spec Grav, UA: 1.01
UROBILINOGEN UA: 0.2
pH, UA: 6

## 2015-10-16 LAB — HEMOGLOBIN A1C: Hgb A1c MFr Bld: 7.6 % — ABNORMAL HIGH (ref 4.6–6.5)

## 2015-10-16 LAB — MICROALBUMIN / CREATININE URINE RATIO
Creatinine,U: 65 mg/dL
Microalb Creat Ratio: 17.9 mg/g (ref 0.0–30.0)
Microalb, Ur: 11.6 mg/dL — ABNORMAL HIGH (ref 0.0–1.9)

## 2015-10-17 ENCOUNTER — Other Ambulatory Visit: Payer: Self-pay

## 2015-10-17 MED ORDER — ONETOUCH VERIO VI STRP: ORAL_STRIP | Status: DC

## 2015-10-21 ENCOUNTER — Encounter: Payer: Self-pay | Admitting: Endocrinology

## 2015-10-21 ENCOUNTER — Ambulatory Visit (INDEPENDENT_AMBULATORY_CARE_PROVIDER_SITE_OTHER): Payer: Medicare Other | Admitting: Endocrinology

## 2015-10-21 VITALS — BP 136/72 | HR 78 | Ht 69.0 in | Wt 192.0 lb

## 2015-10-21 DIAGNOSIS — I209 Angina pectoris, unspecified: Secondary | ICD-10-CM | POA: Diagnosis not present

## 2015-10-21 DIAGNOSIS — E1165 Type 2 diabetes mellitus with hyperglycemia: Secondary | ICD-10-CM | POA: Diagnosis not present

## 2015-10-21 DIAGNOSIS — I10 Essential (primary) hypertension: Secondary | ICD-10-CM

## 2015-10-21 DIAGNOSIS — Z794 Long term (current) use of insulin: Secondary | ICD-10-CM

## 2015-10-21 DIAGNOSIS — E7800 Pure hypercholesterolemia, unspecified: Secondary | ICD-10-CM | POA: Diagnosis not present

## 2015-10-21 NOTE — Progress Notes (Signed)
Patient ID: [name], male   DOB: May 24, 1930, 82 y.o.   MRN: [id number]   Reason for Appointment:  Follow-up of blood pressure and diabetes  History of Present Illness    HYPERTENSION:  this has been well controlled previously.   Because blood pressure was relatively low on the last visit his Coreg was reduced to half tablet in the morning, previously HCTZ and Avapro were stopped. He is also on low-dose Aldactone from cardiologist He is now here for blood pressure follow-up Does not feel weak or tired   Type 2 DIABETES MELITUS, long-standing     He has been on basal bolus regimen of insulin for several years with usually fair control His A1c generally is higher than expected for his home blood sugars Over the last couple of years because of weight loss he had required much less mealtime insulin  Recent history:  Insulin regimen: NovoLog 5 units a.c., Lantus 34 units at bedtime, uses syringes            His blood sugars have been overall difficult to control, last A1c 1.6 Did not bring his monitor for download today  Current blood sugar patterns and problems identified:  He thinks his blood sugars are excellent at all time but does not remember to readings, was 113 this morning  No further hypoglycemia  Oral hypoglycemic drugs:   metformin ER 0.5g bid        Side effects from medications: diarrhea with 1.5g metformin Proper timing of medications in relation to meals: Yes.          Monitors blood glucose:  4 times a day.    Glucometer:  One Touch         Blood Glucose readings not available for review today     Meals: 3 meals per day.  Breakfast 8 AM, Lunch 1 pm, dinner 5 pm   Breakfast eggs, grits, juice; may have peanut butter crackers for snack at night          Physical activity: walking upto 30 minutes when weather is good      Wt Readings from Last 3 Encounters:  10/21/15 192 lb (87.091 kg)  10/02/15 191 lb (86.637 kg)  09/05/15 199 lb 6.4 oz  (90.447 kg)   Lab Results  Component Value Date   HGBA1C 7.6* 10/16/2015   HGBA1C 8.0* 06/24/2015   HGBA1C 7.5* 03/25/2015   Lab Results  Component Value Date   MICROALBUR 11.6* 10/16/2015   LDLCALC 72 10/16/2015   CREATININE 1.24 08/28/2015            Medication List       This list is accurate as of: 10/21/15 12:41 PM.  Always use your most recent med list.               aspirin 81 MG chewable tablet  Chew 81 mg by mouth daily.     atorvastatin 40 MG tablet  Commonly known as:  LIPITOR  TAKE 1 TABLET (40 MG TOTAL) BY MOUTH DAILY.     B-D INS SYRINGE 0.5CC/30GX1/2" 30G X 1/2" 0.5 ML Misc  Generic drug:  Insulin Syringe-Needle U-100  USE 4 PER DAY TO INJECT INSULIN     calcium-vitamin D 500-200 MG-UNIT tablet  Commonly known as:  OSCAL WITH D  Take 1 tablet by mouth daily.     carvedilol 12.5 MG tablet  Commonly known as:  COREG  Take  1 tablet (12.5 mg total) by mouth 2 (two) times daily with a meal.     cholecalciferol 1000 units tablet  Commonly known as:  VITAMIN D  Take 1,000 Units by mouth daily.     cyanocobalamin 500 MCG tablet  Take 500 mcg by mouth daily.     furosemide 20 MG tablet  Commonly known as:  LASIX  Take 1 tablet (20 mg total) by mouth daily.     gabapentin 300 MG capsule  Commonly known as:  NEURONTIN  TAKE 1 CAPSULE (300 MG TOTAL) BY MOUTH 3 (THREE) TIMES DAILY.     irbesartan 300 MG tablet  Commonly known as:  AVAPRO  TAKE 1 TABLET (300 MG TOTAL) BY MOUTH DAILY.     LANTUS 100 UNIT/ML injection  Generic drug:  insulin glargine  INJECT 0.34 MLS (34 UNITS TOTAL) INTO THE SKIN EVERY EVENING.     metFORMIN 500 MG 24 hr tablet  Commonly known as:  GLUCOPHAGE-XR  TAKE 2 TABLETS BY MOUTH DAILY WITH SUPPER.     nitroGLYCERIN 0.4 MG SL tablet  Commonly known as:  NITROSTAT  Place 1 tablet (0.4 mg total) under the tongue every 5 (five) minutes as needed for chest pain.     NOVOLOG 100 UNIT/ML injection  Generic drug:  insulin  aspart  INJECT 5 UNITS INTO THE SKIN 3 TIMES DAILY WITH MEALS     ONETOUCH VERIO test strip  Generic drug:  glucose blood  USE TO TEST 3 TIMES DAILY     spironolactone 25 MG tablet  Commonly known as:  ALDACTONE  TAKE 1 TABLET (25 MG TOTAL) BY MOUTH DAILY.     VIAGRA 100 MG tablet  Generic drug:  sildenafil  TAKE 1 TABLET (100 MG TOTAL) BY MOUTH DAILY AS NEEDED FOR ERECTILE DYSFUNCTION.        Allergies: No Known Allergies  Past Medical History  Diagnosis Date  . Diabetes mellitus   . ED (erectile dysfunction)   . Hypertension   . Hyperlipidemia   . Pneumonia     hx of  . CHF (congestive heart failure) ([place])   . Urination frequency   . Cancer ([place])     hx of prostate ca  . Neuromuscular disorder (HCC)     numbness in hand/cervical issues  . Cataracts, bilateral     hx of  . Syncope     hx of    Past Surgical History  Procedure Laterality Date  . Hernia repair    . Tonsillectomy    . Eye surgery      cataract surgery bilateral  . Small intestine surgery      hx of  . Prostate surgery      s/p ca  . Posterior cervical fusion/foraminotomy  11/18/2011    Procedure: POSTERIOR CERVICAL FUSION/FORAMINOTOMY LEVEL 1;  Surgeon: Eustace Moore, MD;  Location: [place] [facility];  Service: Neurosurgery;  Laterality: Bilateral;    Family History  Problem Relation Age of Onset  . Diabetes Mother   . Hypertension Father     Social History:  reports that he has never smoked. He has never used smokeless tobacco. He reports that he does not drink alcohol or use illicit drugs.  Review of Systems:    Electrolytes and renal function as follows:  Lab Results  Component Value Date   CREATININE 1.24 08/28/2015   BUN 31* 08/28/2015   NA 137 08/28/2015   K 4.8 08/28/2015   CL 103 08/28/2015  CO2 25 08/28/2015    He has had mild B-12 deficiency, Taking take B12 supplements as directed, His last level is normal  Lab Results  Component Value Date   WBC 6.4 10/16/2015    HGB 12.3* 10/16/2015   HCT 36.9* 10/16/2015   MCV 95.5 10/16/2015   PLT 173.0 10/16/2015   Lab Results  Component Value Date   VITAMINB12 830 08/28/2015     Hyperlipidemia: Well controlled  with Lipitor 40 mg, tends to have low HDL  Lab Results  Component Value Date   CHOL 120 10/16/2015   HDL 35.30* 10/16/2015   LDLCALC 72 10/16/2015   LDLDIRECT 79.0 12/24/2013   TRIG 63.0 10/16/2015   CHOLHDL 3 10/16/2015    NEUROPATHY:  Had symptoms of tingling controlled with gabapentin.   He is taking gabapentin up to 3 times a day  See last foot exam: 2/17   Talking about constipation despite taking MiraLAX every other day   Examination:   BP 136/72 mmHg  Pulse 78  Ht 5\' 9"  (1.753 m)  Wt 192 lb (87.091 kg)  BMI 28.34 kg/m2  SpO2 98%  Body mass index is 28.34 kg/(m^2).    Blood pressure was 122/68 standing No ankle edema  ASSESSMENT/ PLAN:   Diabetes type 2   He will have an A1c and will review his blood sugars on the next visit, currently reportedly doing well without hypoglycemia  Hypertension:  His blood pressure is not as low standing up He will continue his regimen unchanged including a half tablet of Coreg in the morning   He can try taking senna OTC on the days he is not taking MiraLAX  HYPERLIPIDEMIA: LDL controlled, HDL low as before.  To continue Lipitor 40 mg  Patient Instructions  Senna laxative OTC  Reduce Novolog to 3-4 if eating less         Ellianah Cordy 10/21/2015, 12:41 PM

## 2015-10-21 NOTE — Patient Instructions (Addendum)
Senna laxative OTC  Reduce Novolog to 3-4 if eating less

## 2015-11-13 ENCOUNTER — Encounter (HOSPITAL_COMMUNITY): Payer: Self-pay | Admitting: Emergency Medicine

## 2015-11-13 ENCOUNTER — Emergency Department (HOSPITAL_COMMUNITY): Payer: Medicare Other

## 2015-11-13 ENCOUNTER — Encounter (HOSPITAL_COMMUNITY)
Admission: EM | Disposition: A | Discharge status: Rehabilitation Facility | Payer: Self-pay | Source: Home / Self Care | Attending: Cardiology

## 2015-11-13 ENCOUNTER — Inpatient Hospital Stay (HOSPITAL_COMMUNITY): Payer: Medicare Other

## 2015-11-13 ENCOUNTER — Inpatient Hospital Stay (HOSPITAL_COMMUNITY)
Admission: EM | Admit: 2015-11-13 | Discharge: 2015-11-18 | DRG: 227 | Disposition: A | Discharge status: Rehabilitation Facility | Payer: Medicare Other | Attending: Cardiology | Admitting: Cardiology

## 2015-11-13 DIAGNOSIS — R001 Bradycardia, unspecified: Secondary | ICD-10-CM | POA: Diagnosis present

## 2015-11-13 DIAGNOSIS — D62 Acute posthemorrhagic anemia: Secondary | ICD-10-CM | POA: Insufficient documentation

## 2015-11-13 DIAGNOSIS — K5900 Constipation, unspecified: Secondary | ICD-10-CM | POA: Diagnosis not present

## 2015-11-13 DIAGNOSIS — E785 Hyperlipidemia, unspecified: Secondary | ICD-10-CM | POA: Diagnosis present

## 2015-11-13 DIAGNOSIS — E1140 Type 2 diabetes mellitus with diabetic neuropathy, unspecified: Secondary | ICD-10-CM | POA: Diagnosis present

## 2015-11-13 DIAGNOSIS — Z794 Long term (current) use of insulin: Secondary | ICD-10-CM

## 2015-11-13 DIAGNOSIS — R0682 Tachypnea, not elsewhere classified: Secondary | ICD-10-CM | POA: Insufficient documentation

## 2015-11-13 DIAGNOSIS — I442 Atrioventricular block, complete: Principal | ICD-10-CM | POA: Diagnosis present

## 2015-11-13 DIAGNOSIS — Z7982 Long term (current) use of aspirin: Secondary | ICD-10-CM

## 2015-11-13 DIAGNOSIS — I5043 Acute on chronic combined systolic (congestive) and diastolic (congestive) heart failure: Secondary | ICD-10-CM | POA: Diagnosis not present

## 2015-11-13 DIAGNOSIS — I447 Left bundle-branch block, unspecified: Secondary | ICD-10-CM | POA: Insufficient documentation

## 2015-11-13 DIAGNOSIS — I4729 Other ventricular tachycardia: Secondary | ICD-10-CM | POA: Insufficient documentation

## 2015-11-13 DIAGNOSIS — Z950 Presence of cardiac pacemaker: Secondary | ICD-10-CM | POA: Diagnosis not present

## 2015-11-13 DIAGNOSIS — R531 Weakness: Secondary | ICD-10-CM | POA: Diagnosis not present

## 2015-11-13 DIAGNOSIS — I10 Essential (primary) hypertension: Secondary | ICD-10-CM | POA: Insufficient documentation

## 2015-11-13 DIAGNOSIS — I5042 Chronic combined systolic (congestive) and diastolic (congestive) heart failure: Secondary | ICD-10-CM | POA: Diagnosis not present

## 2015-11-13 DIAGNOSIS — R350 Frequency of micturition: Secondary | ICD-10-CM | POA: Diagnosis present

## 2015-11-13 DIAGNOSIS — Z87898 Personal history of other specified conditions: Secondary | ICD-10-CM | POA: Insufficient documentation

## 2015-11-13 DIAGNOSIS — E1142 Type 2 diabetes mellitus with diabetic polyneuropathy: Secondary | ICD-10-CM | POA: Diagnosis not present

## 2015-11-13 DIAGNOSIS — I361 Nonrheumatic tricuspid (valve) insufficiency: Secondary | ICD-10-CM | POA: Diagnosis not present

## 2015-11-13 DIAGNOSIS — I472 Ventricular tachycardia: Secondary | ICD-10-CM | POA: Insufficient documentation

## 2015-11-13 DIAGNOSIS — I509 Heart failure, unspecified: Secondary | ICD-10-CM | POA: Diagnosis not present

## 2015-11-13 DIAGNOSIS — R5381 Other malaise: Secondary | ICD-10-CM | POA: Diagnosis not present

## 2015-11-13 DIAGNOSIS — I110 Hypertensive heart disease with heart failure: Secondary | ICD-10-CM | POA: Diagnosis present

## 2015-11-13 DIAGNOSIS — E875 Hyperkalemia: Secondary | ICD-10-CM | POA: Diagnosis not present

## 2015-11-13 DIAGNOSIS — Z8546 Personal history of malignant neoplasm of prostate: Secondary | ICD-10-CM

## 2015-11-13 DIAGNOSIS — I452 Bifascicular block: Secondary | ICD-10-CM | POA: Diagnosis present

## 2015-11-13 DIAGNOSIS — M792 Neuralgia and neuritis, unspecified: Secondary | ICD-10-CM | POA: Diagnosis not present

## 2015-11-13 DIAGNOSIS — Z959 Presence of cardiac and vascular implant and graft, unspecified: Secondary | ICD-10-CM

## 2015-11-13 DIAGNOSIS — Z8249 Family history of ischemic heart disease and other diseases of the circulatory system: Secondary | ICD-10-CM

## 2015-11-13 HISTORY — PX: CARDIAC CATHETERIZATION: SHX172

## 2015-11-13 LAB — CBC WITH DIFFERENTIAL/PLATELET
Basophils Absolute: 0 10*3/uL (ref 0.0–0.1)
Basophils Relative: 1 %
EOS ABS: 0.2 10*3/uL (ref 0.0–0.7)
EOS PCT: 4 %
HCT: 38.9 % — ABNORMAL LOW (ref 39.0–52.0)
Hemoglobin: 12.6 g/dL — ABNORMAL LOW (ref 13.0–17.0)
LYMPHS ABS: 1.1 10*3/uL (ref 0.7–4.0)
Lymphocytes Relative: 24 %
MCH: 31.1 pg (ref 26.0–34.0)
MCHC: 32.4 g/dL (ref 30.0–36.0)
MCV: 96 fL (ref 78.0–100.0)
MONOS PCT: 7 %
Monocytes Absolute: 0.3 10*3/uL (ref 0.1–1.0)
Neutro Abs: 3 10*3/uL (ref 1.7–7.7)
Neutrophils Relative %: 64 %
PLATELETS: 183 10*3/uL (ref 150–400)
RBC: 4.05 MIL/uL — ABNORMAL LOW (ref 4.22–5.81)
RDW: 14.8 % (ref 11.5–15.5)
WBC: 4.6 10*3/uL (ref 4.0–10.5)

## 2015-11-13 LAB — ECHOCARDIOGRAM COMPLETE
E decel time: 299 msec
E/e' ratio: 48.26
FS: 19 % — AB (ref 28–44)
HEIGHTINCHES: 69 in
IV/PV OW: 0.88
LA ID, A-P, ES: 50 mm
LA diam index: 2.43 cm/m2
LA vol A4C: 53.8 ml
LA vol index: 34.4 mL/m2
LA vol: 70.7 mL
LDCA: 2.54 cm2
LEFT ATRIUM END SYS DIAM: 50 mm
LV E/e' medial: 48.26
LV E/e'average: 48.26
LV TDI E'LATERAL: 1.9
LV TDI E'MEDIAL: 3.92
LVELAT: 1.9 cm/s
LVOT SV: 31 mL
LVOT VTI: 12.3 cm
LVOTD: 18 mm
LVOTPV: 63.5 cm/s
MV Dec: 299
MV Peak grad: 3 mmHg
MV pk E vel: 91.7 m/s
PW: 14.8 mm — AB (ref 0.6–1.1)
WEIGHTICAEL: 3015.89 [oz_av]

## 2015-11-13 LAB — MRSA PCR SCREENING: MRSA by PCR: NEGATIVE

## 2015-11-13 LAB — I-STAT CHEM 8, ED
BUN: 17 mg/dL (ref 6–20)
CALCIUM ION: 1.11 mmol/L — AB (ref 1.12–1.23)
CREATININE: 1.1 mg/dL (ref 0.61–1.24)
Chloride: 103 mmol/L (ref 101–111)
GLUCOSE: 288 mg/dL — AB (ref 65–99)
HEMATOCRIT: 41 % (ref 39.0–52.0)
Hemoglobin: 13.9 g/dL (ref 13.0–17.0)
Potassium: 4.1 mmol/L (ref 3.5–5.1)
Sodium: 139 mmol/L (ref 135–145)
TCO2: 22 mmol/L (ref 0–100)

## 2015-11-13 LAB — BASIC METABOLIC PANEL
Anion gap: 13 (ref 5–15)
BUN: 14 mg/dL (ref 6–20)
CHLORIDE: 106 mmol/L (ref 101–111)
CO2: 19 mmol/L — AB (ref 22–32)
CREATININE: 1.4 mg/dL — AB (ref 0.61–1.24)
Calcium: 9 mg/dL (ref 8.9–10.3)
GFR calc Af Amer: 52 mL/min — ABNORMAL LOW (ref >60)
GFR calc non Af Amer: 45 mL/min — ABNORMAL LOW (ref >60)
GLUCOSE: 293 mg/dL — AB (ref 65–99)
Potassium: 4 mmol/L (ref 3.5–5.1)
SODIUM: 138 mmol/L (ref 135–145)

## 2015-11-13 LAB — GLUCOSE, CAPILLARY
GLUCOSE-CAPILLARY: 289 mg/dL — AB (ref 65–99)
Glucose-Capillary: 286 mg/dL — ABNORMAL HIGH (ref 65–99)
Glucose-Capillary: 194 mg/dL — ABNORMAL HIGH (ref 65–99)

## 2015-11-13 LAB — I-STAT TROPONIN, ED: Troponin i, poc: 0.06 ng/mL (ref 0.00–0.08)

## 2015-11-13 LAB — TSH: TSH: 2.38 u[IU]/mL (ref 0.350–4.500)

## 2015-11-13 SURGERY — TEMPORARY PACEMAKER
Anesthesia: LOCAL

## 2015-11-13 MED ORDER — ONDANSETRON HCL 4 MG/2ML IJ SOLN: 4.0000 mg | Freq: Four times a day (QID) | INTRAMUSCULAR | Status: DC | PRN

## 2015-11-13 MED ORDER — CEFAZOLIN SODIUM-DEXTROSE 2-4 GM/100ML-% IV SOLN
2.0000 g | INTRAVENOUS | Status: AC
  Administered 2015-11-14: 2 g via INTRAVENOUS

## 2015-11-13 MED ORDER — SODIUM CHLORIDE 0.9% FLUSH: 3.0000 mL | INTRAVENOUS | Status: DC | PRN

## 2015-11-13 MED ORDER — ACETAMINOPHEN 325 MG PO TABS
650.0000 mg | ORAL_TABLET | ORAL | Status: DC | PRN
  Administered 2015-11-16 – 2015-11-17 (×2): 650 mg via ORAL
  Filled 2015-11-13 (×2): qty 2

## 2015-11-13 MED ORDER — LIDOCAINE HCL (PF) 1 % IJ SOLN
INTRAMUSCULAR | Status: AC
  Filled 2015-11-13: qty 30

## 2015-11-13 MED ORDER — ASPIRIN 81 MG PO CHEW
81.0000 mg | CHEWABLE_TABLET | Freq: Every day | ORAL | Status: DC
  Administered 2015-11-13 – 2015-11-18 (×6): 81 mg via ORAL
  Filled 2015-11-13 (×6): qty 1

## 2015-11-13 MED ORDER — FUROSEMIDE 20 MG PO TABS
20.0000 mg | ORAL_TABLET | Freq: Every day | ORAL | Status: DC
  Administered 2015-11-14 – 2015-11-18 (×5): 20 mg via ORAL
  Filled 2015-11-13 (×5): qty 1

## 2015-11-13 MED ORDER — SODIUM CHLORIDE 0.9% FLUSH
3.0000 mL | Freq: Two times a day (BID) | INTRAVENOUS | Status: DC
  Administered 2015-11-13 – 2015-11-18 (×7): 3 mL via INTRAVENOUS

## 2015-11-13 MED ORDER — INSULIN ASPART 100 UNIT/ML ~~LOC~~ SOLN
0.0000 [IU] | Freq: Three times a day (TID) | SUBCUTANEOUS | Status: DC
  Administered 2015-11-13: 8 [IU] via SUBCUTANEOUS
  Administered 2015-11-15: 2 [IU] via SUBCUTANEOUS
  Administered 2015-11-15 – 2015-11-16 (×2): 3 [IU] via SUBCUTANEOUS
  Administered 2015-11-16: 2 [IU] via SUBCUTANEOUS
  Administered 2015-11-17: 8 [IU] via SUBCUTANEOUS
  Administered 2015-11-17 – 2015-11-18 (×2): 2 [IU] via SUBCUTANEOUS

## 2015-11-13 MED ORDER — SODIUM CHLORIDE 0.9 % IV SOLN
INTRAVENOUS | Status: DC
  Administered 2015-11-14: 06:00:00 via INTRAVENOUS

## 2015-11-13 MED ORDER — GABAPENTIN 300 MG PO CAPS
300.0000 mg | ORAL_CAPSULE | Freq: Three times a day (TID) | ORAL | Status: DC
  Administered 2015-11-13 – 2015-11-18 (×14): 300 mg via ORAL
  Filled 2015-11-13 (×15): qty 1

## 2015-11-13 MED ORDER — SODIUM CHLORIDE 0.9 % IR SOLN
80.0000 mg | Status: AC
  Administered 2015-11-14: 80 mg

## 2015-11-13 MED ORDER — SODIUM CHLORIDE 0.9 % IV SOLN: 250.0000 mL | INTRAVENOUS | Status: DC | PRN

## 2015-11-13 MED ORDER — HEPARIN (PORCINE) IN NACL 2-0.9 UNIT/ML-% IJ SOLN
INTRAMUSCULAR | Status: AC
  Filled 2015-11-13: qty 500

## 2015-11-13 MED ORDER — SPIRONOLACTONE 25 MG PO TABS
25.0000 mg | ORAL_TABLET | Freq: Every day | ORAL | Status: DC
  Administered 2015-11-14 – 2015-11-16 (×3): 25 mg via ORAL
  Filled 2015-11-13 (×3): qty 1

## 2015-11-13 MED ORDER — CHLORHEXIDINE GLUCONATE 4 % EX LIQD
60.0000 mL | Freq: Once | CUTANEOUS | Status: AC
  Administered 2015-11-13: 4 via TOPICAL
  Filled 2015-11-13: qty 60

## 2015-11-13 MED ORDER — LIDOCAINE HCL (PF) 1 % IJ SOLN
INTRAMUSCULAR | Status: DC | PRN
  Administered 2015-11-13: 15 mL

## 2015-11-13 MED ORDER — INSULIN GLARGINE 100 UNIT/ML ~~LOC~~ SOLN
34.0000 [IU] | Freq: Every day | SUBCUTANEOUS | Status: DC
  Administered 2015-11-13 – 2015-11-16 (×4): 34 [IU] via SUBCUTANEOUS
  Filled 2015-11-13 (×5): qty 0.34

## 2015-11-13 MED ORDER — CHLORHEXIDINE GLUCONATE 4 % EX LIQD
60.0000 mL | Freq: Once | CUTANEOUS | Status: AC
  Administered 2015-11-14: 4 via TOPICAL
  Filled 2015-11-13: qty 60

## 2015-11-13 SURGICAL SUPPLY — 4 items
CATH S G BIP PACING (SET/KITS/TRAYS/PACK) ×1 IMPLANT
PACK CARDIAC CATHETERIZATION (CUSTOM PROCEDURE TRAY) ×1 IMPLANT
SHEATH PINNACLE 6F 10CM (SHEATH) ×2 IMPLANT
SLEEVE REPOSITIONING LENGTH 30 (MISCELLANEOUS) ×1 IMPLANT

## 2015-11-13 NOTE — ED Notes (Signed)
Pt becoming more symptomatic at this time - growing more anxious and hypotensive at 79/41 with map of (56). MD Lacinda Axon notified and at the bedside to assess patient.

## 2015-11-13 NOTE — ED Provider Notes (Signed)
BP 78/57, P 28.  Called cath lab.  Reiterated critical vital signs and need for intervention.  Lab will attempt to open 2nd room.  Nat Christen, MD 11/13/15 [phone]

## 2015-11-13 NOTE — H&P (Signed)
ELECTROPHYSIOLOGY HISTORY AND PHYSICAL     Patient ID: [name] MRN: [id number], DOB/AGE: 1930/06/24 82 y.o.  Admit date: 11/13/2015 Date of Consult: 11/13/2015  Primary Physician: Elayne Snare, MD Primary Cardiologist: Tamala Julian  CC: complete heart block  HPI:  [name] is a 82 y.o. male with a past medical history significant for diabetes, hypertension, remote syncope, chronic LBBB, chronic combined systolic and diastolic heart failure. He was in his usual state of health until early this morning when he started "not feeling right" and having dizziness.  He presented to the ER for further evaluation and was found to be in complete heart block.  EP was asked to evaluate for treatment options.    He currently denies chest pain, shortness of breath, recent fevers or chills.  He has not had recent frank syncope.  He is relatively sedentary at home and limited by balance issues. He walks with a cane.   He has not had recent cardiac testing.   Past Medical History  Diagnosis Date  . Diabetes mellitus   . ED (erectile dysfunction)   . Hypertension   . Hyperlipidemia   . Pneumonia     hx of  . CHF (congestive heart failure) ([place])   . Urination frequency   . Cancer ([place])     hx of prostate ca  . Neuromuscular disorder ([facility])     numbness in hand/cervical issues  . Cataracts, bilateral     hx of  . Syncope     hx of     Surgical History:  Past Surgical History  Procedure Laterality Date  . Hernia repair    . Tonsillectomy    . Eye surgery      cataract surgery bilateral  . Small intestine surgery      hx of  . Prostate surgery      s/p ca  . Posterior cervical fusion/foraminotomy  11/18/2011    Procedure: POSTERIOR CERVICAL FUSION/FORAMINOTOMY LEVEL 1;  Surgeon: Eustace Moore, MD;  Location: [facility];  Service: Neurosurgery;  Laterality: Bilateral;     No current facility-administered medications for this encounter.  Current outpatient prescriptions:  .  aspirin  81 MG chewable tablet, Chew 81 mg by mouth daily., Disp: , Rfl:  .  atorvastatin (LIPITOR) 40 MG tablet, TAKE 1 TABLET (40 MG TOTAL) BY MOUTH DAILY., Disp: 90 tablet, Rfl: 1 .  B-D INS SYRINGE 0.5CC/30GX1/2" 30G X 1/2" 0.5 ML MISC, USE 4 PER DAY TO INJECT INSULIN, Disp: , Rfl: 3 .  calcium-vitamin D (OSCAL WITH D) 500-200 MG-UNIT per tablet, Take 1 tablet by mouth daily., Disp: , Rfl:  .  carvedilol (COREG) 12.5 MG tablet, Take 1 tablet (12.5 mg total) by mouth 2 (two) times daily with a meal., Disp: 60 tablet, Rfl: 11 .  cholecalciferol (VITAMIN D) 1000 UNITS tablet, Take 1,000 Units by mouth daily., Disp: , Rfl:  .  cyanocobalamin 500 MCG tablet, Take 500 mcg by mouth daily., Disp: , Rfl:  .  furosemide (LASIX) 20 MG tablet, Take 1 tablet (20 mg total) by mouth daily., Disp: 30 tablet, Rfl: 3 .  gabapentin (NEURONTIN) 300 MG capsule, TAKE 1 CAPSULE (300 MG TOTAL) BY MOUTH 3 (THREE) TIMES DAILY., Disp: 270 capsule, Rfl: 1 .  irbesartan (AVAPRO) 300 MG tablet, TAKE 1 TABLET (300 MG TOTAL) BY MOUTH DAILY. (Patient not taking: Reported on 10/21/2015), Disp: 90 tablet, Rfl: 1 .  LANTUS 100 UNIT/ML injection, INJECT 0.34 MLS (34 UNITS TOTAL) INTO  THE SKIN EVERY EVENING., Disp: 30 mL, Rfl: 1 .  metFORMIN (GLUCOPHAGE-XR) 500 MG 24 hr tablet, TAKE 2 TABLETS BY MOUTH DAILY WITH SUPPER., Disp: 60 tablet, Rfl: 3 .  nitroGLYCERIN (NITROSTAT) 0.4 MG SL tablet, Place 1 tablet (0.4 mg total) under the tongue every 5 (five) minutes as needed for chest pain., Disp: 25 tablet, Rfl: 3 .  NOVOLOG 100 UNIT/ML injection, INJECT 5 UNITS INTO THE SKIN 3 TIMES DAILY WITH MEALS, Disp: 20 mL, Rfl: 1 .  ONETOUCH VERIO test strip, USE TO TEST 3 TIMES DAILY, Disp: 100 each, Rfl: 1 .  spironolactone (ALDACTONE) 25 MG tablet, TAKE 1 TABLET (25 MG TOTAL) BY MOUTH DAILY., Disp: 30 tablet, Rfl: 3 .  VIAGRA 100 MG tablet, TAKE 1 TABLET (100 MG TOTAL) BY MOUTH DAILY AS NEEDED FOR ERECTILE DYSFUNCTION., Disp: 90 tablet, Rfl:  1   Inpatient Medications:   Allergies: No Known Allergies  Social History   Social History  . Marital Status: Widowed    Spouse Name: N/A  . Number of Children: N/A  . Years of Education: N/A   Occupational History  . retired     Actuary   Social History Main Topics  . Smoking status: Never Smoker   . Smokeless tobacco: Never Used  . Alcohol Use: No     Comment: daughter states that used to be "heavy weekend drinker" but quit 10 + years ago  . Drug Use: No  . Sexual Activity: Not Currently   Other Topics Concern  . Not on file   Social History Narrative     Family History  Problem Relation Age of Onset  . Diabetes Mother   . Hypertension Father      Review of Systems: All other systems reviewed and are otherwise negative except as noted above.  Physical Exam: Filed Vitals:   11/13/15 0717 11/13/15 0730 11/13/15 0731 11/13/15 0745  BP: 96/53 89/51  108/76  Pulse: 34 33  30  Temp:   97.4 F (36.3 C)   TempSrc:   Core (Comment)   Resp: 19 13  13   Height:      Weight:      SpO2: 100% 100%  100%    GEN- The patient is elderly appearing, alert and oriented x 3 today.   HEENT: normocephalic, atraumatic; sclera clear, conjunctiva pink; hearing intact; oropharynx clear; neck supple  Lungs- Clear to ausculation bilaterally, normal work of breathing.  No wheezes, rales, rhonchi Heart- Bradycardic regular rate and rhythm  GI- soft, non-tender, non-distended, bowel sounds present  Extremities- no clubbing, cyanosis, or edema; DP/PT/radial pulses 1+ bilaterally MS- no significant deformity or atrophy Skin- warm and dry, no rash or lesion Psych- euthymic mood, full affect Neuro- strength and sensation are intact  Labs:   Lab Results  Component Value Date   WBC 4.6 11/13/2015   HGB 13.9 11/13/2015   HCT 41.0 11/13/2015   MCV 96.0 11/13/2015   PLT 183 11/13/2015    Recent Labs Lab 11/13/15 0707 11/13/15 0709  NA 138 139  K 4.0  4.1  CL 106 103  CO2 19*  --   BUN 14 17  CREATININE 1.40* 1.10  CALCIUM 9.0  --   GLUCOSE 293* 288*      Radiology/Studies: Dg Chest Portable 1 View 11/13/2015  CLINICAL DATA:  Onset of weakness this morning. EXAM: PORTABLE CHEST 1 VIEW COMPARISON:  Single-view of the chest 03/06/2012 and 10/18/2011. FINDINGS: Defibrillator pad is in place. The lungs  are clear. Heart size is upper normal. No pneumothorax or pleural effusion. The patient is status post lower cervical fusion. IMPRESSION: No acute disease. Electronically Signed   By: Inge Rise M.D.   On: 11/13/2015 07:19    EKG: sinus rhythm with complete heart block, RBBB escape, previously LBBB  TELEMETRY: complete heart block, ventricular rate 20's, RBBB alternating with LBBB escape beats  Assessment/Plan: 1.  Complete heart block The patient has symptomatic complete heart block.  He is on Coreg at home and took his last dose yesterday around 6PM. Aleesha Ringstad need to let this wash out for 30 hours.  With previous LBBB and current RBBB as well as alternating bundles on telemetry presently, temporary pacemaker is warranted while allowing Coreg to wash out.  Risks, benefits discussed with patient by Dr Curt Bears who wishes to proceed. He has not had recent ischemic symptoms. I do not think that there is a need for ischemic eval at this time. Update echo If EF remains <50%, would recommend CRTP after Coreg washout tomorrow if conduction does not improve  2.  Chronic combined systolic and diastolic heart failure Euvolemic on exam Hold Coreg for now as above  3.  Diabetes Continue home meds   Signed, Chanetta Marshall, NP 11/13/2015 7:50 AM     I have seen and examined this patient with Chanetta Marshall.  Agree with above, note added to reflect my findings.  On exam, regular rhythm, bradycardic, no murmurs, lungs clear.  Presented to the hospital with extreme fatigue and found to be in 3 degree AVB.  Got temporary wire placed due to heart block  with alternating bundle branch morphologies.  Had last dose of coreg last night.  Kember Boch continue to hold until tomorrow to see if conduction resumes after 5 half lives of the drug.  May require permanent pacing, possibly CRT-P.    Marili Vader M. Jordani Nunn MD 11/13/2015 11:39 AM

## 2015-11-13 NOTE — ED Provider Notes (Signed)
CSN: [id number]     Arrival date & time 11/13/15  [id number] History   First MD Initiated Contact with Patient 11/13/15 [phone]     Chief Complaint  Patient presents with  . Weakness     (Consider location/radiation/quality/duration/timing/severity/associated sxs/prior Treatment) HPI  This is an 82 year old male with a history of diabetes, hypertension, hyperlipidemia, heart failure who presents with weakness. Patient states that he woke up this morning and felt "funny."  Denies chest pain, shortness of breath, or recent illness.  I was called emergently to the room as the patient was noted to be in complete heart block. His heart rate is in the 20s to 30s. Denies any dizziness or syncope. No known history of the same. He is followed by cardiology, Dr. Tamala Julian.  Level V caveat for acuity of condition.  Past Medical History  Diagnosis Date  . Diabetes mellitus   . ED (erectile dysfunction)   . Hypertension   . Hyperlipidemia   . Pneumonia     hx of  . CHF (congestive heart failure) ([place])   . Urination frequency   . Cancer ([place])     hx of prostate ca  . Neuromuscular disorder ([facility])     numbness in hand/cervical issues  . Cataracts, bilateral     hx of  . Syncope     hx of   Past Surgical History  Procedure Laterality Date  . Hernia repair    . Tonsillectomy    . Eye surgery      cataract surgery bilateral  . Small intestine surgery      hx of  . Prostate surgery      s/p ca  . Posterior cervical fusion/foraminotomy  11/18/2011    Procedure: POSTERIOR CERVICAL FUSION/FORAMINOTOMY LEVEL 1;  Surgeon: Eustace Moore, MD;  Location: [facility];  Service: Neurosurgery;  Laterality: Bilateral;   Family History  Problem Relation Age of Onset  . Diabetes Mother   . Hypertension Father    Social History  Substance Use Topics  . Smoking status: Never Smoker   . Smokeless tobacco: Never Used  . Alcohol Use: No     Comment: daughter states that used to be "heavy weekend drinker" but  quit 10 + years ago    Review of Systems  Unable to perform ROS: Acuity of condition  Constitutional: Negative for fever.  Respiratory: Negative for shortness of breath.   Cardiovascular: Negative for chest pain.  Neurological: Positive for weakness.      Allergies  Review of patient's allergies indicates no known allergies.  Home Medications   Prior to Admission medications   Medication Sig Start Date End Date Taking? Authorizing Provider  aspirin 81 MG chewable tablet Chew 81 mg by mouth daily.    Historical Provider, MD  atorvastatin (LIPITOR) 40 MG tablet TAKE 1 TABLET (40 MG TOTAL) BY MOUTH DAILY. 09/09/15   Elayne Snare, MD  B-D INS SYRINGE 0.5CC/30GX1/2" 30G X 1/2" 0.5 ML MISC USE 4 PER DAY TO INJECT INSULIN 08/04/15   Historical Provider, MD  calcium-vitamin D (OSCAL WITH D) 500-200 MG-UNIT per tablet Take 1 tablet by mouth daily.    Historical Provider, MD  carvedilol (COREG) 12.5 MG tablet Take 1 tablet (12.5 mg total) by mouth 2 (two) times daily with a meal. 06/05/14   Belva Crome, MD  cholecalciferol (VITAMIN D) 1000 UNITS tablet Take 1,000 Units by mouth daily.    Historical Provider, MD  cyanocobalamin 500 MCG tablet Take 500 mcg  by mouth daily.    Historical Provider, MD  furosemide (LASIX) 20 MG tablet Take 1 tablet (20 mg total) by mouth daily. 09/18/15   Elayne Snare, MD  gabapentin (NEURONTIN) 300 MG capsule TAKE 1 CAPSULE (300 MG TOTAL) BY MOUTH 3 (THREE) TIMES DAILY. 08/25/15   Elayne Snare, MD  irbesartan (AVAPRO) 300 MG tablet TAKE 1 TABLET (300 MG TOTAL) BY MOUTH DAILY. Patient not taking: Reported on 10/21/2015 10/17/14   Elayne Snare, MD  LANTUS 100 UNIT/ML injection INJECT 0.34 MLS (34 UNITS TOTAL) INTO THE SKIN EVERY EVENING. 10/01/14   Elayne Snare, MD  metFORMIN (GLUCOPHAGE-XR) 500 MG 24 hr tablet TAKE 2 TABLETS BY MOUTH DAILY WITH SUPPER. 09/29/15   Elayne Snare, MD  nitroGLYCERIN (NITROSTAT) 0.4 MG SL tablet Place 1 tablet (0.4 mg total) under the tongue every 5 (five)  minutes as needed for chest pain. 06/05/14   Belva Crome, MD  NOVOLOG 100 UNIT/ML injection INJECT 5 UNITS INTO THE SKIN 3 TIMES DAILY WITH MEALS 07/14/15   Elayne Snare, MD  [facility] VERIO test strip USE TO TEST 3 TIMES DAILY 10/17/15   Elayne Snare, MD  spironolactone (ALDACTONE) 25 MG tablet TAKE 1 TABLET (25 MG TOTAL) BY MOUTH DAILY. 08/18/15   Elayne Snare, MD  VIAGRA 100 MG tablet TAKE 1 TABLET (100 MG TOTAL) BY MOUTH DAILY AS NEEDED FOR ERECTILE DYSFUNCTION. 01/22/15   Elayne Snare, MD   BP 96/58 mmHg  Pulse 32  Temp(Src) 95.9 F (35.5 C) (Axillary)  Resp 24  Ht 5\' 9"  (1.753 m)  Wt 195 lb (88.451 kg)  BMI 28.78 kg/m2  SpO2 100% Physical Exam  Constitutional: He is oriented to person, place, and time. No distress.  HENT:  Head: Normocephalic and atraumatic.  Cardiovascular: Regular rhythm and normal heart sounds.   No murmur heard. Bradycardia  Pulmonary/Chest: Effort normal and breath sounds normal. No respiratory distress. He has no wheezes. He has no rales.  Abdominal: Soft. There is no tenderness.  Musculoskeletal: He exhibits no edema.  Neurological: He is alert and oriented to person, place, and time.  Skin: Skin is warm and dry.  Psychiatric: He has a normal mood and affect.  Nursing note and vitals reviewed.   ED Course  Procedures (including critical care time)  CRITICAL CARE Performed by: Merryl Hacker   Total critical care time: 45 minutes  Critical care time was exclusive of separately billable procedures and treating other patients.  Critical care was necessary to treat or prevent imminent or life-threatening deterioration.  Critical care was time spent personally by me on the following activities: development of treatment plan with patient and/or surrogate as well as nursing, discussions with consultants, evaluation of patient's response to treatment, examination of patient, obtaining history from patient or surrogate, ordering and performing treatments and  interventions, ordering and review of laboratory studies, ordering and review of radiographic studies, pulse oximetry and re-evaluation of patient's condition.  Labs Review Labs Reviewed  CBC WITH DIFFERENTIAL/PLATELET - Abnormal; Notable for the following:    RBC 4.05 (*)    Hemoglobin 12.6 (*)    HCT 38.9 (*)    All other components within normal limits  I-STAT CHEM 8, ED - Abnormal; Notable for the following:    Glucose, Bld 288 (*)    Calcium, Ion 1.11 (*)    All other components within normal limits  BASIC METABOLIC PANEL  Randolm Idol, ED    Imaging Review Dg Chest Portable 1 View  11/13/2015  CLINICAL DATA:  Onset of weakness this morning. EXAM: PORTABLE CHEST 1 VIEW COMPARISON:  Single-view of the chest 03/06/2012 and 10/18/2011. FINDINGS: Defibrillator pad is in place. The lungs are clear. Heart size is upper normal. No pneumothorax or pleural effusion. The patient is status post lower cervical fusion. IMPRESSION: No acute disease. Electronically Signed   By: Inge Rise M.D.   On: 11/13/2015 07:19   I have personally reviewed and evaluated these images and lab results as part of my medical decision-making.   EKG Interpretation   Date/Time:  Thursday November 13 2015 06:52:06 EDT Ventricular Rate:  37 PR Interval:    QRS Duration: 138 QT Interval:  675 QTC Calculation: 530 R Axis:   -78 Text Interpretation:  AV block, complete (third degree) Right bundle  branch block Inferior infarct, old Lateral leads are also involved  Confirmed by HORTON  MD, [place] ([id number]) on 11/13/2015 6:58:31 AM      MDM   Final diagnoses:  Complete heart block ([place])    Patient presents with generalized weakness. Was noted in triage to be in complete heart block. EKG confirms complete heart block. Initial blood pressure 96/58. Repeat blood pressure 123456 systolic. He is currently asymptomatic. Pacer pads were placed. 2 IVs were established and basic labwork including stat Chem-8 were  obtained. Patient does take carvedilol and is on a potassium sparing diuretic. Potassium 4.1. He does not appear volume overloaded. He is mentating well. Initially heart rates between high 20s and mid 40s. However, patient leveled off with heart rates in the high 20s. Cardiology consulted emergently for need for likely pacemaker. Currently he is mentating well. No need for external pacing at this time. Will monitor closely. Temperature 97.4. Initial axillary temperature was documented at 95.9. Suspect that this is a primary cardiac event. Denies any recent infectious symptoms or fevers.  Cardiology is at the bedside. Patient will be taken for a temporary pacemaker this morning.      Merryl Hacker, MD 11/13/15 [phone]

## 2015-11-13 NOTE — Progress Notes (Signed)
Echocardiogram 2D Echocardiogram has been performed.  [name] 11/13/2015, 1:35 PM

## 2015-11-13 NOTE — ED Notes (Signed)
Pt arrives from home with weakness upon waking this morning. Noted to be in complete heartblock in triage. Denies pain, SHOB.

## 2015-11-13 NOTE — ED Notes (Signed)
Cardiology at bedside.

## 2015-11-14 ENCOUNTER — Encounter (HOSPITAL_COMMUNITY)
Admission: EM | Disposition: A | Discharge status: Rehabilitation Facility | Payer: Self-pay | Source: Home / Self Care | Attending: Cardiology

## 2015-11-14 DIAGNOSIS — I442 Atrioventricular block, complete: Secondary | ICD-10-CM

## 2015-11-14 HISTORY — PX: EP IMPLANTABLE DEVICE: SHX172B

## 2015-11-14 LAB — CBC
HEMATOCRIT: 34.1 % — AB (ref 39.0–52.0)
HEMOGLOBIN: 11.4 g/dL — AB (ref 13.0–17.0)
MCH: 31.5 pg (ref 26.0–34.0)
MCHC: 33.4 g/dL (ref 30.0–36.0)
MCV: 94.2 fL (ref 78.0–100.0)
Platelets: 160 10*3/uL (ref 150–400)
RBC: 3.62 MIL/uL — AB (ref 4.22–5.81)
RDW: 14.6 % (ref 11.5–15.5)
WBC: 6.5 10*3/uL (ref 4.0–10.5)

## 2015-11-14 LAB — GLUCOSE, CAPILLARY
Glucose-Capillary: 144 mg/dL — ABNORMAL HIGH (ref 65–99)
Glucose-Capillary: 137 mg/dL — ABNORMAL HIGH (ref 65–99)
Glucose-Capillary: 115 mg/dL — ABNORMAL HIGH (ref 65–99)
Glucose-Capillary: 168 mg/dL — ABNORMAL HIGH (ref 65–99)

## 2015-11-14 LAB — BASIC METABOLIC PANEL
Anion gap: 8 (ref 5–15)
BUN: 18 mg/dL (ref 6–20)
CHLORIDE: 105 mmol/L (ref 101–111)
CO2: 23 mmol/L (ref 22–32)
Calcium: 8.3 mg/dL — ABNORMAL LOW (ref 8.9–10.3)
Creatinine, Ser: 1.36 mg/dL — ABNORMAL HIGH (ref 0.61–1.24)
GFR calc non Af Amer: 47 mL/min — ABNORMAL LOW (ref >60)
GFR, EST AFRICAN AMERICAN: 54 mL/min — AB (ref >60)
Glucose, Bld: 151 mg/dL — ABNORMAL HIGH (ref 65–99)
POTASSIUM: 3.5 mmol/L (ref 3.5–5.1)
SODIUM: 136 mmol/L (ref 135–145)

## 2015-11-14 SURGERY — BIV PACEMAKER INSERTION CRT-P
Anesthesia: LOCAL

## 2015-11-14 MED ORDER — ONDANSETRON HCL 4 MG/2ML IJ SOLN: 4.0000 mg | Freq: Four times a day (QID) | INTRAMUSCULAR | Status: DC | PRN

## 2015-11-14 MED ORDER — IOPAMIDOL (ISOVUE-370) INJECTION 76%
INTRAVENOUS | Status: DC | PRN
  Administered 2015-11-14: 20 mL

## 2015-11-14 MED ORDER — FENTANYL CITRATE (PF) 100 MCG/2ML IJ SOLN
INTRAMUSCULAR | Status: AC
  Filled 2015-11-14: qty 2

## 2015-11-14 MED ORDER — ACETAMINOPHEN 325 MG PO TABS: 325.0000 mg | ORAL_TABLET | ORAL | Status: DC | PRN

## 2015-11-14 MED ORDER — CARVEDILOL 12.5 MG PO TABS
12.5000 mg | ORAL_TABLET | Freq: Two times a day (BID) | ORAL | Status: DC
Start: 2015-11-14 — End: 2015-11-16
  Administered 2015-11-14 – 2015-11-16 (×4): 12.5 mg via ORAL
  Filled 2015-11-14 (×4): qty 1

## 2015-11-14 MED ORDER — CEFAZOLIN SODIUM-DEXTROSE 2-4 GM/100ML-% IV SOLN
INTRAVENOUS | Status: AC
  Filled 2015-11-14: qty 100

## 2015-11-14 MED ORDER — MIDAZOLAM HCL 5 MG/5ML IJ SOLN
INTRAMUSCULAR | Status: DC | PRN
  Administered 2015-11-14 (×2): 1 mg via INTRAVENOUS

## 2015-11-14 MED ORDER — LIDOCAINE HCL (PF) 1 % IJ SOLN
INTRAMUSCULAR | Status: AC
  Filled 2015-11-14: qty 60

## 2015-11-14 MED ORDER — FLUMAZENIL 1 MG/10ML IV SOLN
INTRAVENOUS | Status: AC
  Filled 2015-11-14: qty 10

## 2015-11-14 MED ORDER — CEFAZOLIN IN D5W 1 GM/50ML IV SOLN
1.0000 g | Freq: Four times a day (QID) | INTRAVENOUS | Status: AC
  Administered 2015-11-14 – 2015-11-15 (×3): 1 g via INTRAVENOUS
  Filled 2015-11-14 (×4): qty 50

## 2015-11-14 MED ORDER — FLUMAZENIL 1 MG/10ML IV SOLN
INTRAVENOUS | Status: DC | PRN
  Administered 2015-11-14 (×2): 0.2 mg via INTRAVENOUS

## 2015-11-14 MED ORDER — SODIUM CHLORIDE 0.9 % IV SOLN
INTRAVENOUS | Status: DC | PRN
  Administered 2015-11-14: 10 mL/h via INTRAVENOUS

## 2015-11-14 MED ORDER — LIDOCAINE HCL (PF) 1 % IJ SOLN
INTRAMUSCULAR | Status: DC | PRN
  Administered 2015-11-14: 35 mL

## 2015-11-14 MED ORDER — HEPARIN (PORCINE) IN NACL 2-0.9 UNIT/ML-% IJ SOLN
INTRAMUSCULAR | Status: DC | PRN
  Administered 2015-11-14: 15:00:00

## 2015-11-14 MED ORDER — SODIUM CHLORIDE 0.9 % IR SOLN
Status: AC
  Filled 2015-11-14: qty 2

## 2015-11-14 MED ORDER — MIDAZOLAM HCL 5 MG/5ML IJ SOLN
INTRAMUSCULAR | Status: AC
  Filled 2015-11-14: qty 5

## 2015-11-14 MED FILL — Heparin Sodium (Porcine) 2 Unit/ML in Sodium Chloride 0.9%: INTRAMUSCULAR | Qty: 500 | Status: AC

## 2015-11-14 SURGICAL SUPPLY — 21 items
ALLURE CRT PM3262 (Pacemaker) ×2 IMPLANT
BALLN COR SINUS VENO 6FR 80 (BALLOONS) ×2
BALLOON COR SINUS VENO 6FR 80 (BALLOONS) IMPLANT
CABLE SURGICAL S-101-97-12 (CABLE) ×2 IMPLANT
CATH CPS DIRECT 135 DS2C020 (CATHETERS) ×1 IMPLANT
ELECT DEFIB PAD ADLT CADENCE (PAD) ×2 IMPLANT
HEMOSTAT SURGICEL 2X4 FIBR (HEMOSTASIS) ×1 IMPLANT
KIT ESSENTIALS PG (KITS) ×1 IMPLANT
LEAD QUARTET 1456Q-86 (Lead) IMPLANT
LEAD TENDRIL MRI 52CM LPA1200M (Lead) ×1 IMPLANT
LEAD TENDRIL MRI 58CM LPA1200M (Lead) ×1 IMPLANT
PACEMAKER ALLURE CRT (Pacemaker) IMPLANT
PAD DEFIB LIFELINK (PAD) ×1 IMPLANT
QUARTET 1456Q-86 (Lead) ×2 IMPLANT
SHEATH CLASSIC 8F (SHEATH) ×2 IMPLANT
SHEATH CLASSIC 9.5F (SHEATH) ×1 IMPLANT
SHIELD RADPAD SCOOP 12X17 (MISCELLANEOUS) ×1 IMPLANT
SLITTER UNIVERSAL DS2A003 (MISCELLANEOUS) ×2 IMPLANT
TRAY PACEMAKER INSERTION (PACKS) ×1 IMPLANT
WIRE ACUITY WHISPER EDS 4648 (WIRE) ×4 IMPLANT
WIRE HI TORQ VERSACORE-J 145CM (WIRE) ×2 IMPLANT

## 2015-11-14 NOTE — Progress Notes (Signed)
SUBJECTIVE: The patient is doing well today.  At this time, he denies chest pain, shortness of breath, or any new concerns.  Marland Kitchen aspirin  81 mg Oral Daily  .  ceFAZolin (ANCEF) IV  2 g Intravenous To Cath  . furosemide  20 mg Oral Daily  . gabapentin  300 mg Oral TID  . gentamicin irrigation  80 mg Irrigation To Cath  . insulin aspart  0-15 Units Subcutaneous TID WC  . insulin glargine  34 Units Subcutaneous QHS  . sodium chloride flush  3 mL Intravenous Q12H  . spironolactone  25 mg Oral Daily   . sodium chloride 50 mL/hr at 11/14/15 0542    OBJECTIVE: Physical Exam: Filed Vitals:   11/14/15 0400 11/14/15 0500 11/14/15 0733 11/14/15 0800  BP: 112/69 124/69 128/75 124/75  Pulse: 59 59 60 58  Temp: 97.6 F (36.4 C)  97.5 F (36.4 C)   TempSrc: Oral  Oral   Resp: 25 23 12 24   Height:      Weight:      SpO2: 100% 100% 100% 100%    Intake/Output Summary (Last 24 hours) at 11/14/15 0917 Last data filed at 11/14/15 0800  Gross per 24 hour  Intake    940 ml  Output    926 ml  Net     14 ml    Telemetry reveals paced rhythm  GEN- The patient is well appearing, alert and oriented x 3 today.   Head- normocephalic, atraumatic Eyes-  Sclera clear, conjunctiva pink Ears- hearing intact Oropharynx- clear Neck- supple, no JVP Lungs- Clear to ausculation bilaterally, normal work of breathing Heart- Regular rate and rhythm, no significant murmurs, no rubs or gallops GI- soft, NT, ND Extremities- no clubbing, cyanosis, or edema Skin- no rash or lesion Psych- euthymic mood, full affect Neuro- no gross deficits appreciated  LABS: Basic Metabolic Panel:  Recent Labs  11/13/15 0707 11/13/15 0709 11/14/15 0309  NA 138 139 136  K 4.0 4.1 3.5  CL 106 103 105  CO2 19*  --  23  GLUCOSE 293* 288* 151*  BUN 14 17 18   CREATININE 1.40* 1.10 1.36*  CALCIUM 9.0  --  8.3*   CBC:  Recent Labs  11/13/15 0707 11/13/15 0709 11/14/15 0309  WBC 4.6  --  6.5  NEUTROABS 3.0   --   --   HGB 12.6* 13.9 11.4*  HCT 38.9* 41.0 34.1*  MCV 96.0  --  94.2  PLT 183  --  160   Thyroid Function Tests:  Recent Labs  11/13/15 1027  TSH 2.380    RADIOLOGY: Dg Chest Portable 1 View 11/13/2015  CLINICAL DATA:  Onset of weakness this morning. EXAM: PORTABLE CHEST 1 VIEW COMPARISON:  Single-view of the chest 03/06/2012 and 10/18/2011. FINDINGS: Defibrillator pad is in place. The lungs are clear. Heart size is upper normal. No pneumothorax or pleural effusion. The patient is status post lower cervical fusion. IMPRESSION: No acute disease. Electronically Signed   By: Inge Rise M.D.   On: 11/13/2015 07:19   11/13/15: Echocardiogram Study Conclusions - Left ventricle: Diffuse hypokinesis worse in the inferior and  posterior lateral walls. The cavity size was mildly dilated. Wall  thickness was increased in a pattern of mild LVH. Systolic  function was mildly to moderately reduced. The estimated ejection  fraction was in the range of 40% to 45%. Doppler parameters are  consistent with both elevated ventricular end-diastolic filling  pressure and elevated left atrial  filling pressure. - Mitral valve: moderately calcified anterior leaflet. There was  mild regurgitation. - Left atrium: The atrium was moderately dilated. - Right atrium: The atrium was mildly dilated. - Atrial septum: No defect or patent foramen ovale was identified. - Systemic veins: ? catheter in IVC.   ASSESSMENT AND PLAN:  1. Complete heart block       S/p temp wire, pacing       Last dose of Coreg 11/12/15 approx 6PM       TSH wnl       EF 40-45%       Plan is for CRT-P this afternoon  2. Chronic combined systolic and diastolic heart failure      Will resume BB post pacer      He is euvolemic  3. Diabetes      Continue home meds  Tommye Standard, Hershal Coria 11/14/2015 9:17 AM

## 2015-11-14 NOTE — Care Management Important Message (Signed)
Important Message  Patient Details  Name: [name] MRN: :[id number] Date of Birth: 19-Sep-1930   Medicare Important Message Given:  Yes    Loann Quill 11/14/2015, 10:24 AM

## 2015-11-14 NOTE — Progress Notes (Signed)
Would pull sheath in am anticpate discharge on SUNDAY   Spoke with daughter-- she is very stressed and overwhelmed in thrying to care for her father Will get social services to see what is available for home support and day support for her father Will get her contact for health wellness center for her own care--depression

## 2015-11-14 NOTE — Discharge Instructions (Signed)
° ° °  Supplemental Discharge Instructions for  °Pacemaker/Defibrillator Patients ° °Activity °No heavy lifting or vigorous activity with your left/right arm for 6 to 8 weeks.  Do not raise your left/right arm above your head for one week.  Gradually raise your affected arm as drawn below. ° °        °   11/18/15                     11/19/15                    11/20/15                  11/21/15 ° ° °NO DRIVING for  1 week  ; you may begin driving on  11/21/15   . ° °WOUND CARE °- Keep the wound area clean and dry.  Do not get this area wet for 24 hours. No showers for 24hours; you may shower on  11/15/15 evening   . °- The tape/steri-strips on your wound will fall off; do not pull them off.  No bandage is needed on the site.  DO  NOT apply any creams, oils, or ointments to the wound area. °- If you notice any drainage or discharge from the wound, any swelling or bruising at the site, or you develop a fever > 101? F after you are discharged home, call the office at once. ° °Special Instructions °- You are still able to use cellular telephones; use the ear opposite the side where you have your pacemaker/defibrillator.  Avoid carrying your cellular phone near your device. °- When traveling through airports, show security personnel your identification card to avoid being screened in the metal detectors.  Ask the security personnel to use the hand wand. °- Avoid arc welding equipment, MRI testing (magnetic resonance imaging), TENS units (transcutaneous nerve stimulators).  Call the office for questions about other devices. °- Avoid electrical appliances that are in poor condition or are not properly grounded. °- Microwave ovens are safe to be near or to operate. ° °Additional information for defibrillator patients should your device go off: °- If your device goes off ONCE and you feel fine afterward, notify the device clinic nurses. °- If your device goes off ONCE and you do not feel well afterward, call 911. °- If your device  goes off TWICE, call 911. °- If your device goes off THREE times in one day, call 911. ° °DO NOT DRIVE YOURSELF OR A FAMILY MEMBER °WITH A DEFIBRILLATOR TO THE HOSPITAL--CALL 911. ° °

## 2015-11-15 ENCOUNTER — Inpatient Hospital Stay (HOSPITAL_COMMUNITY): Payer: Medicare Other

## 2015-11-15 LAB — GLUCOSE, CAPILLARY
GLUCOSE-CAPILLARY: 154 mg/dL — AB (ref 65–99)
GLUCOSE-CAPILLARY: 55 mg/dL — AB (ref 65–99)
GLUCOSE-CAPILLARY: 69 mg/dL (ref 65–99)
Glucose-Capillary: 144 mg/dL — ABNORMAL HIGH (ref 65–99)
Glucose-Capillary: 86 mg/dL (ref 65–99)
Glucose-Capillary: 111 mg/dL — ABNORMAL HIGH (ref 65–99)

## 2015-11-15 LAB — HEMOGLOBIN A1C
HEMOGLOBIN A1C: 7.3 % — AB (ref 4.8–5.6)
MEAN PLASMA GLUCOSE: 163 mg/dL

## 2015-11-15 NOTE — Progress Notes (Signed)
Patient Name: [name]      SUBJECTIVE: without complaints A little confused  Past Medical History  Diagnosis Date  . Diabetes mellitus   . ED (erectile dysfunction)   . Hypertension   . Hyperlipidemia   . Pneumonia     hx of  . CHF (congestive heart failure) ([place])   . Urination frequency   . Cancer ([place])     hx of prostate ca  . Neuromuscular disorder (HCC)     numbness in hand/cervical issues  . Cataracts, bilateral     hx of  . Syncope     hx of    Scheduled Meds:  Scheduled Meds: . aspirin  81 mg Oral Daily  . carvedilol  12.5 mg Oral BID WC  .  ceFAZolin (ANCEF) IV  1 g Intravenous Q6H  . furosemide  20 mg Oral Daily  . gabapentin  300 mg Oral TID  . insulin aspart  0-15 Units Subcutaneous TID WC  . insulin glargine  34 Units Subcutaneous QHS  . sodium chloride flush  3 mL Intravenous Q12H  . spironolactone  25 mg Oral Daily   Continuous Infusions:  sodium chloride, acetaminophen, acetaminophen, ondansetron (ZOFRAN) IV, ondansetron (ZOFRAN) IV, sodium chloride flush    PHYSICAL EXAM Filed Vitals:   11/15/15 0400 11/15/15 0500 11/15/15 0600 11/15/15 0745  BP: 146/86 137/91 138/89   Pulse: 70 73 59   Temp: 97.6 F (36.4 C)   97.4 F (36.3 C)  TempSrc: Oral   Oral  Resp: 24 28 23    Height:      Weight:      SpO2: 98% 100% 100%     Well developed and nourished in no acute distress HENT normal Neck supple with JVP-flat [place] without  hematoma, swelling or tenderness  Regular rate and rhythm, no murmurs or gallops Abd-soft with active BS No Clubbing cyanosis edema Skin-warm and dry A & Oriented  Grossly normal sensory and motor function   TELEMETRY: Reviewed telemetry pt in P-synchronous/ AV  pacing CXR  Stable lead position     Intake/Output Summary (Last 24 hours) at 11/15/15 0758 Last data filed at 11/15/15 0600  Gross per 24 hour  Intake   1190 ml  Output   2400 ml  Net  -1210 ml    LABS: Basic Metabolic  Panel:  Recent Labs Lab 11/13/15 0707 11/13/15 0709 11/14/15 0309  NA 138 139 136  K 4.0 4.1 3.5  CL 106 103 105  CO2 19*  --  23  GLUCOSE 293* 288* 151*  BUN 14 17 18   CREATININE 1.40* 1.10 1.36*  CALCIUM 9.0  --  8.3*   Cardiac Enzymes: No results for input(s): CKTOTAL, CKMB, CKMBINDEX, TROPONINI in the last 72 hours. CBC:  Recent Labs Lab 11/13/15 0707 11/13/15 0709 11/14/15 0309  WBC 4.6  --  6.5  NEUTROABS 3.0  --   --   HGB 12.6* 13.9 11.4*  HCT 38.9* 41.0 34.1*  MCV 96.0  --  94.2  PLT 183  --  160   PROTIME: No results for input(s): LABPROT, INR in the last 72 hours. Liver Function Tests: No results for input(s): AST, ALT, ALKPHOS, BILITOT, PROT, ALBUMIN in the last 72 hours. No results for input(s): LIPASE, AMYLASE in the last 72 hours. BNP: BNP (last 3 results) No results for input(s): BNP in the last 8760 hours.  ProBNP (last 3 results) No results for input(s): PROBNP in  the last 8760 hours.  D-Dimer: No results for input(s): DDIMER in the last 72 hours. Hemoglobin A1C:  Recent Labs  11/14/15 0309  HGBA1C 7.3*   Fasting Lipid Panel: No results for input(s): CHOL, HDL, LDLCALC, TRIG, CHOLHDL, LDLDIRECT in the last 72 hours. Thyroid Function Tests:  Recent Labs  11/13/15 1027  TSH 2.380   Anemia Panel: No results for input(s): VITAMINB12, FOLATE, FERRITIN, TIBC, IRON, RETICCTPCT in the last 72 hours.   Device Interrogation:  Normal  Device function    ASSESSMENT AND PLAN:  Active Problems:   Bradycardia, severe sinus   Complete heart block (HCC) diabetes Pull venous sheath Ambulate today Home tomorrow Refused social services consult for home support? Little paranoia with dementia Check BMET in am  meds are confusing to me with no ACE and aldactone despite DM but will defer to Dr Hiram Gash   Signed, Virl Axe MD  11/15/2015

## 2015-11-16 DIAGNOSIS — I472 Ventricular tachycardia: Secondary | ICD-10-CM | POA: Insufficient documentation

## 2015-11-16 DIAGNOSIS — I4729 Other ventricular tachycardia: Secondary | ICD-10-CM | POA: Insufficient documentation

## 2015-11-16 LAB — GLUCOSE, CAPILLARY
GLUCOSE-CAPILLARY: 77 mg/dL (ref 65–99)
GLUCOSE-CAPILLARY: 138 mg/dL — AB (ref 65–99)
Glucose-Capillary: 180 mg/dL — ABNORMAL HIGH (ref 65–99)
Glucose-Capillary: 196 mg/dL — ABNORMAL HIGH (ref 65–99)

## 2015-11-16 LAB — BASIC METABOLIC PANEL
ANION GAP: 8 (ref 5–15)
BUN: 13 mg/dL (ref 6–20)
CALCIUM: 8.7 mg/dL — AB (ref 8.9–10.3)
CO2: 21 mmol/L — ABNORMAL LOW (ref 22–32)
Chloride: 107 mmol/L (ref 101–111)
Creatinine, Ser: 1.11 mg/dL (ref 0.61–1.24)
GFR, EST NON AFRICAN AMERICAN: 60 mL/min — AB (ref >60)
Glucose, Bld: 178 mg/dL — ABNORMAL HIGH (ref 65–99)
Potassium: 5.2 mmol/L — ABNORMAL HIGH (ref 3.5–5.1)
Sodium: 136 mmol/L (ref 135–145)

## 2015-11-16 MED ORDER — METOPROLOL TARTRATE 25 MG PO TABS
25.0000 mg | ORAL_TABLET | Freq: Two times a day (BID) | ORAL | Status: DC
  Administered 2015-11-16 – 2015-11-18 (×4): 25 mg via ORAL
  Filled 2015-11-16 (×4): qty 1

## 2015-11-16 MED ORDER — METOPROLOL TARTRATE 12.5 MG HALF TABLET
12.5000 mg | ORAL_TABLET | Freq: Once | ORAL | Status: AC
  Administered 2015-11-16: 12.5 mg via ORAL
  Filled 2015-11-16: qty 1

## 2015-11-16 NOTE — Progress Notes (Signed)
Hospital Problem List     Principal Problem:   Complete heart block [facility]) Active Problems:   Bradycardia, severe sinus     Patient Profile:   Primary Cardiologist: Dr. Tamala Julian EP: Dr. Caryl Comes  Subjective   Denies any chest pain or shortness of breath overnight. Feeling well. Anxious to go home.  Inpatient Medications    . aspirin  81 mg Oral Daily  . carvedilol  12.5 mg Oral BID WC  . furosemide  20 mg Oral Daily  . gabapentin  300 mg Oral TID  . insulin aspart  0-15 Units Subcutaneous TID WC  . insulin glargine  34 Units Subcutaneous QHS  . sodium chloride flush  3 mL Intravenous Q12H  . spironolactone  25 mg Oral Daily    Vital Signs    Filed Vitals:   11/16/15 0400 11/16/15 0500 11/16/15 0600 11/16/15 0700  BP: 142/75 134/73 128/82 136/81  Pulse: 70 70 77 75  Temp: 97.7 F (36.5 C)     TempSrc: Oral     Resp: 10 17 20 17   Height:      Weight:      SpO2: 100% 97% 97% 96%    Intake/Output Summary (Last 24 hours) at 11/16/15 0735 Last data filed at 11/16/15 0400  Gross per 24 hour  Intake    780 ml  Output   1510 ml  Net   -730 ml   Filed Weights   11/13/15 0704 11/13/15 0904  Weight: 195 lb (88.451 kg) 188 lb 7.9 oz (85.5 kg)    Physical Exam    General: Elderly African American  male appearing in no acute distress. Head: Normocephalic, atraumatic.  Neck: Supple without bruits, JVD not elevated. Lungs:  Resp regular and unlabored, CTA without wheezing or rales. Heart: RRR, S1, S2, no S3, S4, or murmur; no rub. Mild erythema present along PPM site.  Abdomen: Soft, non-tender, non-distended with normoactive bowel sounds. No hepatomegaly. No rebound/guarding. No obvious abdominal masses. Extremities: No clubbing, cyanosis, or edema. Distal pedal pulses are 2+ bilaterally. Neuro: Alert and oriented X 3. Moves all extremities spontaneously. Psych: Normal affect.  Labs    CBC  Recent Labs  11/14/15 0309  WBC 6.5  HGB 11.4*  HCT 34.1*  MCV  94.2  PLT 0000000   Basic Metabolic Panel  Recent Labs  11/14/15 0309  NA 136  K 3.5  CL 105  CO2 23  GLUCOSE 151*  BUN 18  CREATININE 1.36*  CALCIUM 8.3*   Hemoglobin A1C  Recent Labs  11/14/15 0309  HGBA1C 7.3*   Fasting Lipid Panel No results for input(s): CHOL, HDL, LDLCALC, TRIG, CHOLHDL, LDLDIRECT in the last 72 hours. Thyroid Function Tests  Recent Labs  11/13/15 1027  TSH 2.380    Telemetry    AV-paced HR in 70's.   ECG    No new tracings.   Cardiac Studies and Radiology    Dg Chest Port 1 View  11/15/2015  CLINICAL DATA:  82 year old male status post pacemaker placement yesterday. Initial encounter. EXAM: PORTABLE CHEST 1 VIEW COMPARISON:  11/13/2015 and earlier. FINDINGS: Portable AP semi upright view at 0626 hours. Left chest subclavian approach triple lead cardiac pacemaker now in place. Mediastinal contours are stable, normal aside from mild cardiomegaly. No pneumothorax or pulmonary edema. Allowing for portable technique, the lungs are clear. Stable visualized osseous structures. Lower cervical posterior element fusion hardware re- demonstrated. IMPRESSION: Left chest cardiac pacemaker placed with no acute cardiopulmonary abnormality. Electronically  Signed   By: Genevie Ann M.D.   On: 11/15/2015 07:50   Dg Chest Portable 1 View  11/13/2015  CLINICAL DATA:  Onset of weakness this morning. EXAM: PORTABLE CHEST 1 VIEW COMPARISON:  Single-view of the chest 03/06/2012 and 10/18/2011. FINDINGS: Defibrillator pad is in place. The lungs are clear. Heart size is upper normal. No pneumothorax or pleural effusion. The patient is status post lower cervical fusion. IMPRESSION: No acute disease. Electronically Signed   By: Inge Rise M.D.   On: 11/13/2015 07:19    Assessment & Plan    1. Complete heart block - presented to [facility] ED on 11/13/2015 for dizziness, found to be in complete heart block. - s/p temp wire pacing on admission.  - s/p St Jude dual lead PPM  with an LV lead on 11/14/2015. No complications noted following the procedure.  2. Chronic combined systolic and diastolic heart failure - Echo this admission with EF of 40-45%.  - continue Coreg, Lasix, and Spironolactone.  3.Type 2 DM - Lantus 34U QHS  - SSI while admitted  Will recheck BMET this AM per Dr. Caryl Comes. Patient's nurse reports he lives with his daughter and she is overwhelmed and feels like she will not be able to take care of him at home (anticipated discharge today). Patient has refused to talk with Social Work. Will get PT to evaluate this AM and determine if [place] are recommended.  Signed, Erma Heritage , PA-C 7:35 AM 11/16/2015 Pager: [phone]

## 2015-11-17 ENCOUNTER — Encounter (HOSPITAL_COMMUNITY): Payer: Self-pay | Admitting: Internal Medicine

## 2015-11-17 DIAGNOSIS — E875 Hyperkalemia: Secondary | ICD-10-CM

## 2015-11-17 DIAGNOSIS — D62 Acute posthemorrhagic anemia: Secondary | ICD-10-CM

## 2015-11-17 DIAGNOSIS — Z9189 Other specified personal risk factors, not elsewhere classified: Secondary | ICD-10-CM

## 2015-11-17 DIAGNOSIS — I447 Left bundle-branch block, unspecified: Secondary | ICD-10-CM

## 2015-11-17 DIAGNOSIS — R0682 Tachypnea, not elsewhere classified: Secondary | ICD-10-CM

## 2015-11-17 DIAGNOSIS — E1142 Type 2 diabetes mellitus with diabetic polyneuropathy: Secondary | ICD-10-CM

## 2015-11-17 DIAGNOSIS — I5042 Chronic combined systolic (congestive) and diastolic (congestive) heart failure: Secondary | ICD-10-CM

## 2015-11-17 DIAGNOSIS — Z950 Presence of cardiac pacemaker: Secondary | ICD-10-CM

## 2015-11-17 DIAGNOSIS — I442 Atrioventricular block, complete: Principal | ICD-10-CM

## 2015-11-17 DIAGNOSIS — Z87898 Personal history of other specified conditions: Secondary | ICD-10-CM | POA: Insufficient documentation

## 2015-11-17 DIAGNOSIS — I10 Essential (primary) hypertension: Secondary | ICD-10-CM | POA: Insufficient documentation

## 2015-11-17 LAB — CBC
HEMATOCRIT: 40.1 % (ref 39.0–52.0)
Hemoglobin: 13.1 g/dL (ref 13.0–17.0)
MCH: 31.2 pg (ref 26.0–34.0)
MCHC: 32.7 g/dL (ref 30.0–36.0)
MCV: 95.5 fL (ref 78.0–100.0)
Platelets: 163 10*3/uL (ref 150–400)
RBC: 4.2 MIL/uL — ABNORMAL LOW (ref 4.22–5.81)
RDW: 14.7 % (ref 11.5–15.5)
WBC: 7 10*3/uL (ref 4.0–10.5)

## 2015-11-17 LAB — GLUCOSE, CAPILLARY
GLUCOSE-CAPILLARY: 143 mg/dL — AB (ref 65–99)
GLUCOSE-CAPILLARY: 252 mg/dL — AB (ref 65–99)
Glucose-Capillary: 60 mg/dL — ABNORMAL LOW (ref 65–99)
Glucose-Capillary: 62 mg/dL — ABNORMAL LOW (ref 65–99)
Glucose-Capillary: 143 mg/dL — ABNORMAL HIGH (ref 65–99)
Glucose-Capillary: 99 mg/dL (ref 65–99)

## 2015-11-17 LAB — BASIC METABOLIC PANEL
Anion gap: 9 (ref 5–15)
Anion gap: 9 (ref 5–15)
BUN: 11 mg/dL (ref 6–20)
BUN: 13 mg/dL (ref 6–20)
CHLORIDE: 104 mmol/L (ref 101–111)
CO2: 26 mmol/L (ref 22–32)
CO2: 25 mmol/L (ref 22–32)
Calcium: 8.9 mg/dL (ref 8.9–10.3)
Calcium: 8.8 mg/dL — ABNORMAL LOW (ref 8.9–10.3)
Chloride: 104 mmol/L (ref 101–111)
Creatinine, Ser: 1.02 mg/dL (ref 0.61–1.24)
Creatinine, Ser: 1.02 mg/dL (ref 0.61–1.24)
GFR calc Af Amer: >60 mL/min (ref >60)
GFR calc non Af Amer: >60 mL/min (ref >60)
GLUCOSE: 96 mg/dL (ref 65–99)
Glucose, Bld: 208 mg/dL — ABNORMAL HIGH (ref 65–99)
POTASSIUM: 4.3 mmol/L (ref 3.5–5.1)
POTASSIUM: 4.5 mmol/L (ref 3.5–5.1)
SODIUM: 139 mmol/L (ref 135–145)
SODIUM: 138 mmol/L (ref 135–145)

## 2015-11-17 MED ORDER — INSULIN GLARGINE 100 UNIT/ML ~~LOC~~ SOLN
20.0000 [IU] | Freq: Every day | SUBCUTANEOUS | Status: DC
  Administered 2015-11-17: 20 [IU] via SUBCUTANEOUS
  Filled 2015-11-17 (×2): qty 0.2

## 2015-11-17 MED ORDER — IRBESARTAN 75 MG PO TABS
75.0000 mg | ORAL_TABLET | Freq: Every day | ORAL | Status: DC
  Administered 2015-11-17 – 2015-11-18 (×2): 75 mg via ORAL
  Filled 2015-11-17 (×2): qty 1

## 2015-11-17 MED ORDER — DEXTROSE 50 % IV SOLN
25.0000 mL | Freq: Once | INTRAVENOUS | Status: AC
  Administered 2015-11-17: 25 mL via INTRAVENOUS

## 2015-11-17 MED ORDER — LISINOPRIL 2.5 MG PO TABS: 2.5000 mg | ORAL_TABLET | Freq: Every day | ORAL | Status: DC

## 2015-11-17 MED ORDER — DEXTROSE 50 % IV SOLN
INTRAVENOUS | Status: AC
Start: 2015-11-17 — End: 2015-11-17
  Administered 2015-11-17: 25 mL via INTRAVENOUS
  Filled 2015-11-17: qty 50

## 2015-11-17 MED FILL — Cefazolin Sodium-Dextrose IV Solution 2 GM/100ML-4%: INTRAVENOUS | Qty: 100 | Status: AC

## 2015-11-17 MED FILL — Sodium Chloride Irrigation Soln 0.9%: Qty: 500 | Status: AC

## 2015-11-17 MED FILL — Gentamicin Sulfate Inj 40 MG/ML: INTRAMUSCULAR | Qty: 2 | Status: AC

## 2015-11-17 NOTE — Progress Notes (Signed)
Hypoglycemic Event  CBG: 60  Treatment: Orange juice pt can take PO  Symptoms: None  Follow-up CBG: Time: [id number] CBG Result:62  Possible Reasons for Event: Unknown   69mL Dextrose given per pts IV repeat CBG 143.    Maxie Barb

## 2015-11-17 NOTE — Progress Notes (Addendum)
Inpatient Diabetes Program Recommendations  AACE/ADA: New Consensus Statement on Inpatient Glycemic Control (2015)  Target Ranges:  Prepandial:   less than 140 mg/dL      Peak postprandial:   less than 180 mg/dL (1-2 hours)      Critically ill patients:  140 - 180 mg/dL   Lab Results  Component Value Date   GLUCAP 143* 11/17/2015   HGBA1C 7.3* 11/14/2015    Review of Glycemic ControlResults for QASIM, SCHICKEL (MRN [id number]) as of 11/17/2015 09:36  Ref. Range 11/16/2015 17:07 11/16/2015 21:36 11/17/2015 07:33 11/17/2015 07:54 11/17/2015 08:34  Glucose-Capillary Latest Ref Range: 65-99 mg/dL 138 (H) 196 (H) 60 (L) 62 (L) 143 (H)   Diabetes history: Type 2 diabetes Outpatient Diabetes medications: Lantus 34 units q PM, Metformin XR 1000 mg with supper Current orders for Inpatient glycemic control:  Novolog moderate tid with meals, Lantus 34 units q HS  Inpatient Diabetes Program Recommendations:   Note low fasting blood sugar.  Please consider reducing Lantus to 20 units q HS.  Called and spoke with PA and verbal order received.   Thanks, Adah Perl, RN, BC-ADM Inpatient Diabetes Coordinator Pager [phone] (8a-5p)

## 2015-11-17 NOTE — Clinical Social Work Placement (Signed)
   CLINICAL SOCIAL WORK PLACEMENT  NOTE  Date:  11/17/2015  Patient Details  Name: ADVIK MCGINLEY MRN: [id number] Date of Birth: June 25, 1930  Clinical Social Work is seeking post-discharge placement for this patient at the [place] level of care (*CSW will initial, date and re-position this form in  chart as items are completed):  Yes   Patient/family provided with [facility] Work Department's list of facilities offering this level of care within the geographic area requested by the patient (or if unable, by the patient's family).  Yes   Patient/family informed of their freedom to choose among providers that offer the needed level of care, that participate in Medicare, Medicaid or managed care program needed by the patient, have an available bed and are willing to accept the patient.  Yes   Patient/family informed of [facility]'s ownership interest in [facility] and [facility], as well as of the fact that they are under no obligation to receive care at these facilities.  PASRR submitted to EDS on 11/17/15     PASRR number received on 11/17/15     Existing PASRR number confirmed on       FL2 transmitted to all facilities in geographic area requested by pt/family on 11/17/15     FL2 transmitted to all facilities within larger geographic area on       Patient informed that his/her managed care company has contracts with or will negotiate with certain facilities, including the following:            Patient/family informed of bed offers received.  Patient chooses bed at       Physician recommends and patient chooses bed at      Patient to be transferred to   on  .  Patient to be transferred to facility by       Patient family notified on   of transfer.  Name of family member notified:        PHYSICIAN Please sign FL2     Additional Comment:    _______________________________________________ Caroline Sauger, LCSW 11/17/2015, 3:49  PM

## 2015-11-17 NOTE — Progress Notes (Signed)
SUBJECTIVE: The patient is doing well today.  At this time, he denies chest pain, shortness of breath, or any new concerns.  He is open to the idea of going to rehab for strengthening prior to returning home.  Marland Kitchen aspirin  81 mg Oral Daily  . furosemide  20 mg Oral Daily  . gabapentin  300 mg Oral TID  . insulin aspart  0-15 Units Subcutaneous TID WC  . insulin glargine  34 Units Subcutaneous QHS  . metoprolol tartrate  25 mg Oral BID  . sodium chloride flush  3 mL Intravenous Q12H  . spironolactone  25 mg Oral Daily      OBJECTIVE: Physical Exam: Filed Vitals:   11/17/15 0400 11/17/15 0500 11/17/15 0600 11/17/15 0737  BP: 107/46 100/64 108/73 122/80  Pulse: 73 72 73 72  Temp: 97.9 F (36.6 C)   97.6 F (36.4 C)  TempSrc: Oral   Oral  Resp: 21 23 13 18   Height:      Weight:      SpO2: 94% 95% 98% 98%    Intake/Output Summary (Last 24 hours) at 11/17/15 0756 Last data filed at 11/17/15 0500  Gross per 24 hour  Intake    600 ml  Output   1550 ml  Net   -950 ml    Telemetry reveals AV paced rhythm, one 5-beat NSVT noted only  GEN- The patient is well appearing, alert and oriented x 3 today.   Head- normocephalic, atraumatic Eyes-  Sclera clear, conjunctiva pink Ears- hearing intact Oropharynx- clear Neck- supple, no JVP Lungs- Clear to ausculation bilaterally, normal work of breathing Heart- Regular rate and rhythm, no significant murmurs, no rubs or gallops GI- soft, NT, ND Extremities- no clubbing, cyanosis, or edema Skin- no rash or lesion Psych- euthymic mood, full affect Neuro- no gross deficits appreciated  Procedures sites: R groin soft, no hematoma, no bleeding PPM site is dry, no hematoma  LABS: Basic Metabolic Panel:  Recent Labs  11/16/15 1111  NA 136  K 5.2*  CL 107  CO2 21*  GLUCOSE 178*  BUN 13  CREATININE 1.11  CALCIUM 8.7*    RADIOLOGY: Dg Chest Port 1 View 11/15/2015  CLINICAL DATA:  82 year old male status post pacemaker  placement yesterday. Initial encounter. EXAM: PORTABLE CHEST 1 VIEW COMPARISON:  11/13/2015 and earlier. FINDINGS: Portable AP semi upright view at 0626 hours. Left chest subclavian approach triple lead cardiac pacemaker now in place. Mediastinal contours are stable, normal aside from mild cardiomegaly. No pneumothorax or pulmonary edema. Allowing for portable technique, the lungs are clear. Stable visualized osseous structures. Lower cervical posterior element fusion hardware re- demonstrated. IMPRESSION: Left chest cardiac pacemaker placed with no acute cardiopulmonary abnormality. Electronically Signed   By: Genevie Ann M.D.   On: 11/15/2015 07:50      ASSESSMENT AND PLAN:  1. Complete heart block  S/p temp wire, pacing >>> CRT-P implant 11/14/15       LVEF 40-45%   2. Chronic combined systolic and diastolic heart failure  back on BB, renal function is improved by lab yesterday will add on ACE      Fluid neg cumulative -2906  3. Diabetes  Continue home meds  4. Functional decline     Pending PT evaluation today for disposition/needs      The patient this morning states he is OK with the idea of going to a rehab   anticipate discharge pending PT eval  Tommye Standard, PA-C 11/17/2015  7:56 AM  Resume irbersartan as was at home  Start 75  Stop aldactone OT /PT  Home;'rehab when ready

## 2015-11-17 NOTE — Care Management Important Message (Signed)
Important Message  Patient Details  Name: [name] MRN: [id number] Date of Birth: Nov 12, 1930   Medicare Important Message Given:  Yes    Loann Quill 11/17/2015, 8:11 AM

## 2015-11-17 NOTE — Consult Note (Signed)
Physical Medicine and Rehabilitation Consult Reason for Consult: Debilitation/multi-medical Referring Physician: Dr. Curt Bears   HPI: [name] is a 82 y.o. right handed male with history of diabetes mellitus with neuropathy, hypertension, remote syncope, chronic left bundle branch block, chronic combined systolic diastolic congestive heart failure. Patient lives with daughter and assistance is needed. Independent with assistive device using quad cane prior to admission and driving short distances. One level home with 3 steps to entry. Presented 11/13/2015 with dizziness/question syncope. Troponin negative. He was found to be in complete heart block. Follow-up with cardiology services. Echocardiogram ejection fraction of 45%. Systolic function was mildly to moderately reduced. Underwent placement of permanent pacemaker 11/14/2015. Physical therapy evaluation completed 11/17/2015 with recommendations of physical medicine rehabilitation consult.  Review of Systems  Constitutional: Negative for fever and chills.  HENT: Negative for hearing loss.   Eyes: Negative for blurred vision and double vision.  Respiratory: Negative for cough and shortness of breath.   Gastrointestinal: Positive for constipation. Negative for nausea and vomiting.  Genitourinary: Positive for frequency.  Skin: Negative for rash.  Neurological: Positive for dizziness. Negative for seizures and headaches.  All other systems reviewed and are negative.  Past Medical History  Diagnosis Date  . Diabetes mellitus   . ED (erectile dysfunction)   . Hypertension   . Hyperlipidemia   . Pneumonia     hx of  . CHF (congestive heart failure) ([place])   . Urination frequency   . Cancer ([place])     hx of prostate ca  . Neuromuscular disorder (HCC)     numbness in hand/cervical issues  . Cataracts, bilateral     hx of  . Syncope     hx of   Past Surgical History  Procedure Laterality Date  . Hernia repair    .  Tonsillectomy    . Eye surgery      cataract surgery bilateral  . Small intestine surgery      hx of  . Prostate surgery      s/p ca  . Posterior cervical fusion/foraminotomy  11/18/2011    Procedure: POSTERIOR CERVICAL FUSION/FORAMINOTOMY LEVEL 1;  Surgeon: Eustace Moore, MD;  Location: [facility];  Service: Neurosurgery;  Laterality: Bilateral;  . Cardiac catheterization N/A 11/13/2015    Procedure: Temporary Pacemaker;  Surgeon: Lorretta Harp, MD;  Location: [facility];  Service: Cardiovascular;  Laterality: N/A;  . Ep implantable device N/A 11/14/2015    Procedure: BiV Pacemaker Insertion CRT-P;  Surgeon: Deboraha Sprang, MD;  Location: [facility];  Service: Cardiovascular;  Laterality: N/A;   Family History  Problem Relation Age of Onset  . Diabetes Mother   . Hypertension Father    Social History:  reports that he has never smoked. He has never used smokeless tobacco. He reports that he does not drink alcohol or use illicit drugs. Allergies: No Known Allergies Medications Prior to Admission  Medication Sig Dispense Refill  . aspirin 81 MG chewable tablet Chew 81 mg by mouth daily.    Marland Kitchen atorvastatin (LIPITOR) 40 MG tablet TAKE 1 TABLET (40 MG TOTAL) BY MOUTH DAILY. 90 tablet 1  . calcium-vitamin D (OSCAL WITH D) 500-200 MG-UNIT per tablet Take 1 tablet by mouth daily.    . carvedilol (COREG) 12.5 MG tablet Take 1 tablet (12.5 mg total) by mouth 2 (two) times daily with a meal. (Patient taking differently: Take 12.5-25 mg by mouth See admin instructions. Pt  takes 12.5mg  in am and 25mg  in pm) 60 tablet 11  . cholecalciferol (VITAMIN D) 1000 UNITS tablet Take 1,000 Units by mouth daily.    . cyanocobalamin 500 MCG tablet Take 500 mcg by mouth daily.    . furosemide (LASIX) 20 MG tablet Take 1 tablet (20 mg total) by mouth daily. 30 tablet 3  . gabapentin (NEURONTIN) 300 MG capsule TAKE 1 CAPSULE (300 MG TOTAL) BY MOUTH 3 (THREE) TIMES DAILY. 270 capsule 1  . LANTUS 100  UNIT/ML injection INJECT 0.34 MLS (34 UNITS TOTAL) INTO THE SKIN EVERY EVENING. 30 mL 1  . metFORMIN (GLUCOPHAGE-XR) 500 MG 24 hr tablet TAKE 2 TABLETS BY MOUTH DAILY WITH SUPPER. (Patient taking differently: TAKE 2 TABLETS=1000mg  BY MOUTH DAILY WITH SUPPER.) 60 tablet 3  . nitroGLYCERIN (NITROSTAT) 0.4 MG SL tablet Place 1 tablet (0.4 mg total) under the tongue every 5 (five) minutes as needed for chest pain. 25 tablet 3  . NOVOLOG 100 UNIT/ML injection INJECT 5 UNITS INTO THE SKIN 3 TIMES DAILY WITH MEALS 20 mL 1  . spironolactone (ALDACTONE) 25 MG tablet TAKE 1 TABLET (25 MG TOTAL) BY MOUTH DAILY. 30 tablet 3  . VIAGRA 100 MG tablet TAKE 1 TABLET (100 MG TOTAL) BY MOUTH DAILY AS NEEDED FOR ERECTILE DYSFUNCTION. 90 tablet 1    Home: Home Living Family/patient expects to be discharged to:: Inpatient rehab Living Arrangements: Children Available Help at Discharge: Family, Available 24 hours/day Type of Home: House Home Access: Stairs to enter [employer] of Steps: 3 Entrance Stairs-Rails: None Home Layout: One level Bathroom Shower/Tub: Walk-in shower Home Equipment: Cane - quad, Hand held shower head, Grab bars - tub/shower, Shower seat  Functional History: Prior Function Level of Independence: Independent with assistive device(s) Comments: used cane, drove himself, daughter took care of home management Functional Status:  Mobility: Bed Mobility Overal bed mobility: Needs Assistance Bed Mobility: Supine to Sit Supine to sit: Min assist General bed mobility comments: increased time, especially with scooting to EOB and mod cues for precautions Transfers Overall transfer level: Needs assistance Equipment used: Quad cane Transfers: Sit to/from Stand Sit to Stand: Mod assist General transfer comment: lifting assist from EOB, pulls up on cane despite cues Ambulation/Gait Ambulation/Gait assistance: Mod assist Ambulation Distance (Feet): 3 Feet Assistive device: Quad  cane Gait Pattern/deviations: Decreased stride length, Leaning posteriorly, Wide base of support General Gait Details: heavy support posteriorly due to imbalance with pt walking to sink, then pivotal steps with cane to the chair, again with heavy support to prevent posterior LOB    ADL:    Cognition: Cognition Overall Cognitive Status: Within Functional Limits for tasks assessed Orientation Level: Oriented to person, Oriented to place, Oriented to situation Cognition Arousal/Alertness: Awake/alert Behavior During Therapy: [facility] for tasks assessed/performed Overall Cognitive Status: Within Functional Limits for tasks assessed  Blood pressure 113/66, pulse 75, temperature 97.3 F (36.3 C), temperature source Oral, resp. rate 22, height 5\' 9"  (1.753 m), weight 85.5 kg (188 lb 7.9 oz), SpO2 99 %. Physical Exam  Vitals reviewed. Constitutional: He is oriented to person, place, and time. He appears well-developed and well-nourished.  HENT:  Head: Normocephalic and atraumatic.  Eyes: Conjunctivae and EOM are normal.  Neck: Normal range of motion. Neck supple. No thyromegaly present.  Cardiovascular:  Cardiac rate control  Respiratory: Effort normal and breath sounds normal. No respiratory distress.  GI: Soft. Bowel sounds are normal. He exhibits no distension.  Musculoskeletal: He exhibits no edema or tenderness.  Neurological:  He is alert and oriented to person, place, and time.  Motor: 4+/5 throughout Sensation intact to light touch  Skin: Skin is warm and dry.  Pacemaker site clean and dry with shoulder sling in place  Psychiatric: He has a normal mood and affect. His behavior is normal. Thought content normal.    Results for orders placed or performed during the hospital encounter of 11/13/15 (from the past 24 hour(s))  Glucose, capillary     Status: Abnormal   Collection Time: 11/16/15  5:07 PM  Result Value Ref Range   Glucose-Capillary 138 (H) 65 - 99 mg/dL   Comment 1  Capillary Specimen   Glucose, capillary     Status: Abnormal   Collection Time: 11/16/15  9:36 PM  Result Value Ref Range   Glucose-Capillary 196 (H) 65 - 99 mg/dL   Comment 1 Capillary Specimen   Glucose, capillary     Status: Abnormal   Collection Time: 11/17/15  7:33 AM  Result Value Ref Range   Glucose-Capillary 60 (L) 65 - 99 mg/dL  Glucose, capillary     Status: Abnormal   Collection Time: 11/17/15  7:54 AM  Result Value Ref Range   Glucose-Capillary 62 (L) 65 - 99 mg/dL   Comment 1 Capillary Specimen   Glucose, capillary     Status: Abnormal   Collection Time: 11/17/15  8:34 AM  Result Value Ref Range   Glucose-Capillary 143 (H) 65 - 99 mg/dL   Comment 1 Capillary Specimen    No results found.  Assessment/Plan: Diagnosis: Debilitation Labs and images independently reviewed.  Records reviewed and summated above.  1. Does the need for close, 24 hr/day medical supervision in concert with the patient's rehab needs make it unreasonable for this patient to be served in a less intensive setting? Potentially  2. Co-Morbidities requiring supervision/potential complications: diabetes mellitus with neuropathy (Monitor in accordance with exercise and adjust meds as necessary), HTN (monitor and provide prns in accordance with increased physical exertion and pain), remote syncope, chronic left bundle branch block, chronic combined systolic diastolic congestive heart failure (Monitor in accordance with increased physical activity and avoid UE resistance excercises), tachypnea (monitor RR and O2 Sats with increased physical exertion), ABLA (transfuse if necessary to ensure appropriate perfusion for increased activity tolerance) 3. Due to safety, skin/wound care, disease management and patient education, does the patient require 24 hr/day rehab nursing? Potentially 4. Does the patient require coordinated care of a physician, rehab nurse, PT (1-2 hrs/day, 5 days/week) and OT (1-2 hrs/day, 5  days/week) to address physical and functional deficits in the context of the above medical diagnosis(es)? Potentially Addressing deficits in the following areas: balance, endurance, locomotion, strength, transferring, toileting and psychosocial support 5. Can the patient actively participate in an intensive therapy program of at least 3 hrs of therapy per day at least 5 days per week? Yes 6. The potential for patient to make measurable gains while on inpatient rehab is excellent 7. Anticipated functional outcomes upon discharge from inpatient rehab are modified independent and supervision  with PT, modified independent with OT, n/a with SLP. 8. Estimated rehab length of stay to reach the above functional goals is: 14-18 days. 9. Does the patient have adequate social supports and living environment to accommodate these discharge functional goals? Potentially 10. Anticipated D/C setting: Home 11. Anticipated post D/C treatments: HH therapy and Home excercise program 12. Overall Rehab/Functional Prognosis: excellent  RECOMMENDATIONS: This patient's condition is appropriate for continued rehabilitative care in the following setting:  CIR Patient has agreed to participate in recommended program. Yes Note that insurance prior authorization may be required for reimbursement for recommended care.  Comment: Rehab Admissions Coordinator to follow up.  Delice Lesch, MD 11/17/2015

## 2015-11-17 NOTE — Evaluation (Signed)
Physical Therapy Evaluation Patient Details Name: [name] MRN: [id number] DOB: [birth year] Today's Date: 11/17/2015   History of Present Illness  [name] is a 82 y.o. male with a past medical history significant for diabetes, hypertension, remote syncope, chronic LBBB, chronic combined systolic and diastolic heart failure. Patient admitted with dizziness, found to be in complete heart block.  Now s/p PPM.  Clinical Impression  Patient presents with decreased independence with mobility due to deficits listed in PT problem list.  He will benefit from skilled PT in the acute setting to allow return home with family support following CIR level rehab stay.  Patient limited due to imbalance, decreased proprioception and poor activity tolerance.    Follow Up Recommendations CIR    Equipment Recommendations  Other (comment) (TBA)    Recommendations for Other Services Rehab consult;OT consult     Precautions / Restrictions Precautions Precautions: Fall;ICD/Pacemaker Required Braces or Orthoses: Sling Restrictions Weight Bearing Restrictions: No      Mobility  Bed Mobility Overal bed mobility: Needs Assistance Bed Mobility: Supine to Sit     Supine to sit: Min assist     General bed mobility comments: increased time, especially with scooting to EOB and mod cues for precautions  Transfers Overall transfer level: Needs assistance Equipment used: Quad cane Transfers: Sit to/from Stand Sit to Stand: Mod assist         General transfer comment: lifting assist from EOB, pulls up on cane despite cues  Ambulation/Gait Ambulation/Gait assistance: Mod assist Ambulation Distance (Feet): 3 Feet Assistive device: Quad cane Gait Pattern/deviations: Decreased stride length;Leaning posteriorly;Wide base of support     General Gait Details: heavy support posteriorly due to imbalance with pt walking to sink, then pivotal steps with cane to the chair, again with heavy support to  prevent posterior LOB  Stairs            Wheelchair Mobility    Modified Rankin (Stroke Patients Only)       Balance Overall balance assessment: Needs assistance Sitting-balance support: Feet unsupported;Feet supported Sitting balance-Leahy Scale: Fair   Postural control: Posterior lean Standing balance support: Single extremity supported Standing balance-Leahy Scale: Poor Standing balance comment: standing at sink for ant weight shifts with heel raises x 5 with R UE support on sink, improved to allow close S to minguard A standing without UE support, but posterior once again when backing up and shifting to recliner                             Pertinent Vitals/Pain Pain Assessment: Faces Faces Pain Scale: Hurts little more Pain Location: L shoulder Pain Descriptors / Indicators: Sore Pain Intervention(s): Premedicated before session;Repositioned    Home Living Family/patient expects to be discharged to:: Inpatient rehab Living Arrangements: Children Available Help at Discharge: Family;Available 24 hours/day Type of Home: House Home Access: Stairs to enter Entrance Stairs-Rails: None Entrance Stairs-Number of Steps: 3 Home Layout: One level Home Equipment: Cane - quad;Hand held shower head;Grab bars - tub/shower;Shower seat      Prior Function Level of Independence: Independent with assistive device(s)         Comments: used cane, drove himself, daughter took care of home management     Hand Dominance   Dominant Hand: Right    Extremity/Trunk Assessment   Upper Extremity Assessment: LUE deficits/detail       LUE Deficits / Details: NT due to Pacemaker precautions, moves independently,  however with cues needed for precautions and assist to adjust sling   Lower Extremity Assessment: RLE deficits/detail;LLE deficits/detail RLE Deficits / Details: stiffness in ankles, pt reports intact sensation, but question proprioceptive changes with h/o  DM LLE Deficits / Details: stiffness in ankles, pt reports intact sensation, but question proprioceptive changes with h/o DM     Communication   Communication: No difficulties  Cognition Arousal/Alertness: Awake/alert Behavior During Therapy: WFL for tasks assessed/performed Overall Cognitive Status: Within Functional Limits for tasks assessed                      General Comments      Exercises        Assessment/Plan    PT Assessment Patient needs continued PT services  PT Diagnosis Abnormality of gait;Generalized weakness   PT Problem List Decreased strength;Decreased activity tolerance;Decreased balance;Decreased mobility;Decreased knowledge of use of DME;Decreased safety awareness;Decreased knowledge of precautions  PT Treatment Interventions DME instruction;Balance training;Gait training;Stair training;Functional mobility training;Patient/family education;Neuromuscular re-education;Therapeutic activities;Therapeutic exercise   PT Goals (Current goals can be found in the Care Plan section) Acute Rehab PT Goals Patient Stated Goal: To go to rehab  PT Goal Formulation: With patient Time For Goal Achievement: 11/24/15 Potential to Achieve Goals: Good    Frequency Min 3X/week   Barriers to discharge        Co-evaluation               End of Session Equipment Utilized During Treatment: Gait belt Activity Tolerance: Patient limited by fatigue Patient left: with call bell/phone within reach;in chair           Time: [id number] PT Time Calculation (min) (ACUTE ONLY): 26 min   Charges:   PT Evaluation $PT Eval Moderate Complexity: 1 Procedure PT Treatments $Therapeutic Activity: 8-22 mins   PT G Codes:        Reginia Naas 11-19-2015, 10:00 AM  Magda Kiel, [place] November 19, 2015

## 2015-11-17 NOTE — Progress Notes (Signed)
Physical medicine rehabilitation consult requested chart reviewed. At this time await formal physical and occupational therapy evaluations to be completed follow-up at that time with appropriate recommendations

## 2015-11-17 NOTE — NC FL2 (Signed)
[place] LEVEL OF CARE SCREENING TOOL     IDENTIFICATION  Patient Name: [name] Birthdate: 12/17/1930 Sex: male Admission Date (Current Location): 11/13/2015  [facility] and [place] Number:  Herbalist and Address:  [facility] [place], [place] [address], [place], [place] [address]      Provider Number: [id number]  Attending Physician Name and Address:  Will Meredith Leeds, MD  Relative Name and Phone Number:       Current Level of Care: Hospital Recommended Level of Care: [place] Prior Approval Number:    Date Approved/Denied:   PASRR Number: [id number]  Discharge Plan: SNF    Current Diagnoses: Patient Active Problem List   Diagnosis Date Noted  . Pacemaker   . DM type 2 with diabetic peripheral neuropathy ([place])   . Benign essential HTN   . History of syncope   . LBBB (left bundle branch block)   . Tachypnea   . Acute blood loss anemia   . NSVT (nonsustained ventricular tachycardia) ([place])   . Bradycardia, severe sinus 11/13/2015  . Complete heart block ([place])   . Chronic combined systolic and diastolic heart failure ([place]) 04/27/2013  . Essential hypertension 04/27/2013  . Left bundle branch block 04/27/2013  . Type II or unspecified type diabetes mellitus with neurological manifestations, uncontrolled 01/15/2013    Orientation RESPIRATION BLADDER Height & Weight     Self, Place, Situation  Normal Incontinent Weight: 188 lb 7.9 oz (85.5 kg) Height:  5\' 9"  (175.3 cm)  BEHAVIORAL SYMPTOMS/MOOD NEUROLOGICAL BOWEL NUTRITION STATUS      Continent Diet (Please see discharge summary.)  AMBULATORY STATUS COMMUNICATION OF NEEDS Skin   Extensive Assist Verbally Surgical wounds                       Personal Care Assistance Level of Assistance  Bathing, Feeding, Dressing Bathing Assistance: Limited assistance Feeding assistance: Independent Dressing Assistance: Limited assistance     Functional  Limitations Info             [place]  PT (By licensed PT), OT (By licensed OT)     PT Frequency: 5 OT Frequency: 5            Contractures      Additional Factors Info  Code Status, Allergies Code Status Info: FULL Allergies Info: No known allergies.           Current Medications (11/17/2015):  This is the current hospital active medication list Current Facility-Administered Medications  Medication Dose Route Frequency Provider Last Rate Last Dose  . 0.9 %  sodium chloride infusion  250 mL Intravenous PRN Patsey Berthold, NP      . acetaminophen (TYLENOL) tablet 325-650 mg  325-650 mg Oral Q4H PRN Deboraha Sprang, MD      . acetaminophen (TYLENOL) tablet 650 mg  650 mg Oral Q4H PRN Patsey Berthold, NP   650 mg at 11/17/15 0858  . aspirin chewable tablet 81 mg  81 mg Oral Daily Patsey Berthold, NP   81 mg at 11/17/15 0900  . furosemide (LASIX) tablet 20 mg  20 mg Oral Daily Patsey Berthold, NP   20 mg at 11/17/15 0900  . gabapentin (NEURONTIN) capsule 300 mg  300 mg Oral TID Patsey Berthold, NP   300 mg at 11/17/15 0900  . insulin aspart (novoLOG) injection 0-15 Units  0-15 Units Subcutaneous TID WC  Patsey Berthold, NP   8 Units at 11/17/15 1208  . insulin glargine (LANTUS) injection 20 Units  20 Units Subcutaneous QHS Renee Dyane Dustman, PA-C      . irbesartan (AVAPRO) tablet 75 mg  75 mg Oral Daily Deboraha Sprang, MD   75 mg at 11/17/15 0900  . metoprolol tartrate (LOPRESSOR) tablet 25 mg  25 mg Oral BID Skeet Latch, MD   25 mg at 11/17/15 0900  . ondansetron (ZOFRAN) injection 4 mg  4 mg Intravenous Q6H PRN Patsey Berthold, NP      . ondansetron (ZOFRAN) injection 4 mg  4 mg Intravenous Q6H PRN Deboraha Sprang, MD      . sodium chloride flush (NS) 0.9 % injection 3 mL  3 mL Intravenous Q12H Patsey Berthold, NP   3 mL at 11/17/15 0901  . sodium chloride flush (NS) 0.9 % injection 3 mL  3 mL Intravenous PRN Patsey Berthold, NP         Discharge  Medications: Please see discharge summary for a list of discharge medications.  Relevant Imaging Results:  Relevant Lab Results:   Additional Information SSN: [id number]  Caroline Sauger, LCSW

## 2015-11-17 NOTE — Clinical Social Work Note (Signed)
Clinical Social Work Assessment  Patient Details  Name: [name] MRN: [id number] Date of Birth: June 09, 1930  Date of referral:  11/17/15               Reason for consult:  Facility Placement                Permission sought to share information with:  Facility Sport and exercise psychologist, Family Supports Permission granted to share information::  Yes, Verbal Permission Granted  Name::     [place]::  [facility]F  Relationship::  Daughter  Contact Information:  [phone]  Housing/Transportation Living arrangements for the past 2 months:  [place] of Information:  Adult Children Patient Interpreter Needed:  None Criminal Activity/Legal Involvement Pertinent to Current Situation/Hospitalization:  No - Comment as needed Significant Relationships:  Adult Children Lives with:  Adult Children Do you feel safe going back to the place where you live?  No Need for family participation in patient care:  Yes (Comment) (Patient's daughter active in patient's care.)  Care giving concerns:  Patient's daughter expressed no concerns at this time.   Social Worker assessment / plan:  LCSW received referral for possible CIR/SNF placement at time of discharge. LCSW spoke with patient's daughter, [name], regarding discharge planning. Per patient's daughter, patient's family agreeable to CIR and SNF placement. LCSW to continue to follow and assist with discharge planning needs.  Employment status:  Retired Forensic scientist:  [employer] PT Recommendations:  [place], [place] / Referral to community resources:  [place], Acute Rehab  Patient/Family's Response to care:  Patient's daughter understanding and agreeable to [employer] plan of care.  Patient/Family's Understanding of and Emotional Response to Diagnosis, Current Treatment, and Prognosis:  Patient's daughter understanding and agreeable to LCSW plan  of care.  Emotional Assessment Appearance:  Appears stated age Attitude/Demeanor/Rapport:  Other (LCSW spoke with patient's daughter.) Affect (typically observed):   (LCSW spoke with patient's daughter.) Orientation:  Oriented to Place, Oriented to Self, Oriented to Situation Alcohol / Substance use:  Not Applicable Psych involvement (Current and /or in the community):  No (Comment) (Not appropriate on this admission.)  Discharge Needs  Concerns to be addressed:  No discharge needs identified Readmission within the last 30 days:  No Current discharge risk:  None Barriers to Discharge:  No Barriers Identified   Caroline Sauger, LCSW 11/17/2015, 3:47 PM [phone]

## 2015-11-18 ENCOUNTER — Encounter (HOSPITAL_COMMUNITY): Payer: Self-pay | Admitting: *Deleted

## 2015-11-18 ENCOUNTER — Inpatient Hospital Stay (HOSPITAL_COMMUNITY)
Admission: RE | Admit: 2015-11-18 | Discharge: 2015-11-25 | DRG: 948 | Disposition: A | Discharge status: Home Health | Payer: Medicare Other | Source: Intra-hospital | Attending: Physical Medicine & Rehabilitation | Admitting: Physical Medicine & Rehabilitation

## 2015-11-18 DIAGNOSIS — R0682 Tachypnea, not elsewhere classified: Secondary | ICD-10-CM | POA: Diagnosis present

## 2015-11-18 DIAGNOSIS — Z950 Presence of cardiac pacemaker: Secondary | ICD-10-CM

## 2015-11-18 DIAGNOSIS — I10 Essential (primary) hypertension: Secondary | ICD-10-CM

## 2015-11-18 DIAGNOSIS — I5042 Chronic combined systolic (congestive) and diastolic (congestive) heart failure: Secondary | ICD-10-CM | POA: Diagnosis present

## 2015-11-18 DIAGNOSIS — I110 Hypertensive heart disease with heart failure: Secondary | ICD-10-CM | POA: Diagnosis present

## 2015-11-18 DIAGNOSIS — R5381 Other malaise: Secondary | ICD-10-CM | POA: Diagnosis present

## 2015-11-18 DIAGNOSIS — R000 Tachycardia, unspecified: Secondary | ICD-10-CM | POA: Diagnosis present

## 2015-11-18 DIAGNOSIS — R350 Frequency of micturition: Secondary | ICD-10-CM | POA: Diagnosis not present

## 2015-11-18 DIAGNOSIS — M792 Neuralgia and neuritis, unspecified: Secondary | ICD-10-CM | POA: Diagnosis not present

## 2015-11-18 DIAGNOSIS — I447 Left bundle-branch block, unspecified: Secondary | ICD-10-CM | POA: Diagnosis present

## 2015-11-18 DIAGNOSIS — D62 Acute posthemorrhagic anemia: Secondary | ICD-10-CM | POA: Diagnosis present

## 2015-11-18 DIAGNOSIS — I5043 Acute on chronic combined systolic (congestive) and diastolic (congestive) heart failure: Secondary | ICD-10-CM

## 2015-11-18 DIAGNOSIS — E1142 Type 2 diabetes mellitus with diabetic polyneuropathy: Secondary | ICD-10-CM | POA: Diagnosis present

## 2015-11-18 DIAGNOSIS — Z8546 Personal history of malignant neoplasm of prostate: Secondary | ICD-10-CM

## 2015-11-18 LAB — GLUCOSE, CAPILLARY
GLUCOSE-CAPILLARY: 109 mg/dL — AB (ref 65–99)
Glucose-Capillary: 84 mg/dL (ref 65–99)
Glucose-Capillary: 132 mg/dL — ABNORMAL HIGH (ref 65–99)
Glucose-Capillary: 132 mg/dL — ABNORMAL HIGH (ref 65–99)

## 2015-11-18 MED ORDER — ONDANSETRON HCL 4 MG/2ML IJ SOLN: 4.0000 mg | Freq: Four times a day (QID) | INTRAMUSCULAR | Status: DC | PRN

## 2015-11-18 MED ORDER — INSULIN ASPART 100 UNIT/ML ~~LOC~~ SOLN
0.0000 [IU] | Freq: Three times a day (TID) | SUBCUTANEOUS | Status: DC
  Administered 2015-11-18: 2 [IU] via SUBCUTANEOUS
  Administered 2015-11-19 – 2015-11-20 (×4): 3 [IU] via SUBCUTANEOUS
  Administered 2015-11-21: 8 [IU] via SUBCUTANEOUS
  Administered 2015-11-21 – 2015-11-22 (×2): 5 [IU] via SUBCUTANEOUS
  Administered 2015-11-22: 3 [IU] via SUBCUTANEOUS
  Administered 2015-11-23 (×2): 2 [IU] via SUBCUTANEOUS
  Administered 2015-11-24: 3 [IU] via SUBCUTANEOUS
  Administered 2015-11-24: 2 [IU] via SUBCUTANEOUS

## 2015-11-18 MED ORDER — IRBESARTAN 75 MG PO TABS
75.0000 mg | ORAL_TABLET | Freq: Every day | ORAL | Status: DC
  Administered 2015-11-19 – 2015-11-25 (×7): 75 mg via ORAL
  Filled 2015-11-18 (×7): qty 1

## 2015-11-18 MED ORDER — METOPROLOL TARTRATE 25 MG PO TABS: 25.0000 mg | ORAL_TABLET | Freq: Two times a day (BID) | ORAL | Status: DC

## 2015-11-18 MED ORDER — SENNOSIDES-DOCUSATE SODIUM 8.6-50 MG PO TABS
2.0000 | ORAL_TABLET | Freq: Every day | ORAL | Status: DC
  Administered 2015-11-18 – 2015-11-24 (×7): 2 via ORAL
  Filled 2015-11-18 (×7): qty 2

## 2015-11-18 MED ORDER — SORBITOL 70 % SOLN
30.0000 mL | Freq: Every day | Status: DC | PRN
  Administered 2015-11-19 – 2015-11-23 (×2): 30 mL via ORAL
  Filled 2015-11-18 (×2): qty 30

## 2015-11-18 MED ORDER — INSULIN GLARGINE 100 UNIT/ML ~~LOC~~ SOLN
18.0000 [IU] | Freq: Every day | SUBCUTANEOUS | Status: DC
Start: 2015-11-18 — End: 2015-11-25

## 2015-11-18 MED ORDER — GABAPENTIN 300 MG PO CAPS
300.0000 mg | ORAL_CAPSULE | Freq: Three times a day (TID) | ORAL | Status: DC
  Administered 2015-11-18 – 2015-11-25 (×20): 300 mg via ORAL
  Filled 2015-11-18 (×20): qty 1

## 2015-11-18 MED ORDER — INSULIN GLARGINE 100 UNIT/ML ~~LOC~~ SOLN
20.0000 [IU] | Freq: Every day | SUBCUTANEOUS | Status: DC
  Administered 2015-11-18 – 2015-11-24 (×7): 20 [IU] via SUBCUTANEOUS
  Filled 2015-11-18 (×8): qty 0.2

## 2015-11-18 MED ORDER — ASPIRIN 81 MG PO CHEW
81.0000 mg | CHEWABLE_TABLET | Freq: Every day | ORAL | Status: DC
  Administered 2015-11-19 – 2015-11-25 (×7): 81 mg via ORAL
  Filled 2015-11-18 (×7): qty 1

## 2015-11-18 MED ORDER — ONDANSETRON HCL 4 MG PO TABS: 4.0000 mg | ORAL_TABLET | Freq: Four times a day (QID) | ORAL | Status: DC | PRN

## 2015-11-18 MED ORDER — FUROSEMIDE 20 MG PO TABS
20.0000 mg | ORAL_TABLET | Freq: Every day | ORAL | Status: DC
  Administered 2015-11-19 – 2015-11-25 (×7): 20 mg via ORAL
  Filled 2015-11-18 (×7): qty 1

## 2015-11-18 MED ORDER — IRBESARTAN 75 MG PO TABS: 75.0000 mg | ORAL_TABLET | Freq: Every day | ORAL | Status: DC

## 2015-11-18 MED ORDER — INSULIN ASPART 100 UNIT/ML ~~LOC~~ SOLN: 3.0000 [IU] | Freq: Three times a day (TID) | SUBCUTANEOUS | Status: DC

## 2015-11-18 MED ORDER — METOPROLOL TARTRATE 25 MG PO TABS
25.0000 mg | ORAL_TABLET | Freq: Two times a day (BID) | ORAL | Status: DC
  Administered 2015-11-18 – 2015-11-25 (×14): 25 mg via ORAL
  Filled 2015-11-18 (×14): qty 1

## 2015-11-18 MED ORDER — ACETAMINOPHEN 325 MG PO TABS: 325.0000 mg | ORAL_TABLET | ORAL | Status: DC | PRN

## 2015-11-18 NOTE — Progress Notes (Signed)
Patient ID: [name], male   DOB: 28-Jan-1931, 82 y.o.   MRN: [id number] Patient arrived from 2H14 with RN and belongings. Patient oriented to room, rehab process, fall prevention, rehab safety plan, rehab schedule, and health resource notebook. Patient resting comfortably in bed with no c/o pain.

## 2015-11-18 NOTE — Progress Notes (Addendum)
Inpatient Diabetes Program Recommendations  AACE/ADA: New Consensus Statement on Inpatient Glycemic Control (2015)  Target Ranges:  Prepandial:   less than 140 mg/dL      Peak postprandial:   less than 180 mg/dL (1-2 hours)      Critically ill patients:  140 - 180 mg/dL   Lab Results  Component Value Date   GLUCAP 99 11/17/2015   HGBA1C 7.3* 11/14/2015    Review of Glycemic Control: Results for FILIP, TRIMMER (MRN Ali Molina:[id number]) as of 11/18/2015 09:00  Ref. Range 11/17/2015 07:54 11/17/2015 08:34 11/17/2015 12:06 11/17/2015 16:48 11/17/2015 21:08  Glucose-Capillary Latest Ref Range: 65-99 mg/dL 62 (L) 143 (H) 252 (H) 143 (H) 99   Diabetes history: Type 2 diabetes Outpatient Diabetes medications: Metformin XR 1000 mg with supper, Lantus 34 units q HS, Novolog 5 units tid with meals Current orders for Inpatient glycemic control:  Novolog moderate tid with meals, Lantus 20 units q HS  Inpatient Diabetes Program Recommendations:   CBG=84 mg/dL this morning.  Consider reducing Lantus to 18 units q HS.  Also consider reduction of Novolog meal coverage (home dose) to 3 units tid with meals. Called and discussed with PA.   Thanks, Adah Perl, RN, BC-ADM Inpatient Diabetes Coordinator Pager [phone] (8a-5p)

## 2015-11-18 NOTE — Progress Notes (Signed)
Ankit Lorie Phenix, MD Physician Signed Physical Medicine and Rehabilitation Consult Note 11/17/2015 12:24 PM  Related encounter: ED to Hosp-Admission (Discharged) from 11/13/2015 in [place] Collapse All        Physical Medicine and Rehabilitation Consult Reason for Consult: Debilitation/multi-medical Referring Physician: Dr. Curt Bears   HPI: [name] is a 82 y.o. right handed male with history of diabetes mellitus with neuropathy, hypertension, remote syncope, chronic left bundle branch block, chronic combined systolic diastolic congestive heart failure. Patient lives with daughter and assistance is needed. Independent with assistive device using quad cane prior to admission and driving short distances. One level home with 3 steps to entry. Presented 11/13/2015 with dizziness/question syncope. Troponin negative. He was found to be in complete heart block. Follow-up with cardiology services. Echocardiogram ejection fraction of 45%. Systolic function was mildly to moderately reduced. Underwent placement of permanent pacemaker 11/14/2015. Physical therapy evaluation completed 11/17/2015 with recommendations of physical medicine rehabilitation consult.  Review of Systems  Constitutional: Negative for fever and chills.  HENT: Negative for hearing loss.  Eyes: Negative for blurred vision and double vision.  Respiratory: Negative for cough and shortness of breath.  Gastrointestinal: Positive for constipation. Negative for nausea and vomiting.  Genitourinary: Positive for frequency.  Skin: Negative for rash.  Neurological: Positive for dizziness. Negative for seizures and headaches.  All other systems reviewed and are negative.  Past Medical History  Diagnosis Date  . Diabetes mellitus   . ED (erectile dysfunction)   . Hypertension   . Hyperlipidemia   . Pneumonia     hx of  . CHF (congestive heart failure)  ([place])   . Urination frequency   . Cancer ([place])     hx of prostate ca  . Neuromuscular disorder ([facility])     numbness in hand/cervical issues  . Cataracts, bilateral     hx of  . Syncope     hx of   Past Surgical History  Procedure Laterality Date  . Hernia repair    . Tonsillectomy    . Eye surgery      cataract surgery bilateral  . Small intestine surgery      hx of  . Prostate surgery      s/p ca  . Posterior cervical fusion/foraminotomy  11/18/2011    Procedure: POSTERIOR CERVICAL FUSION/FORAMINOTOMY LEVEL 1; Surgeon: Eustace Moore, MD; Location: [facility]; Service: Neurosurgery; Laterality: Bilateral;  . Cardiac catheterization N/A 11/13/2015    Procedure: Temporary Pacemaker; Surgeon: Lorretta Harp, MD; Location: [facility]; Service: Cardiovascular; Laterality: N/A;  . Ep implantable device N/A 11/14/2015    Procedure: BiV Pacemaker Insertion CRT-P; Surgeon: Deboraha Sprang, MD; Location: [facility]; Service: Cardiovascular; Laterality: N/A;   Family History  Problem Relation Age of Onset  . Diabetes Mother   . Hypertension Father    Social History:  reports that he has never smoked. He has never used smokeless tobacco. He reports that he does not drink alcohol or use illicit drugs. Allergies: No Known Allergies Medications Prior to Admission  Medication Sig Dispense Refill  . aspirin 81 MG chewable tablet Chew 81 mg by mouth daily.    Marland Kitchen atorvastatin (LIPITOR) 40 MG tablet TAKE 1 TABLET (40 MG TOTAL) BY MOUTH DAILY. 90 tablet 1  . calcium-vitamin D (OSCAL WITH D) 500-200 MG-UNIT per tablet Take 1 tablet by mouth daily.    . carvedilol (COREG) 12.5  MG tablet Take 1 tablet (12.5 mg total) by mouth 2 (two) times daily with a meal. (Patient taking differently: Take 12.5-25 mg by mouth See admin instructions. Pt takes 12.5mg  in am and  25mg  in pm) 60 tablet 11  . cholecalciferol (VITAMIN D) 1000 UNITS tablet Take 1,000 Units by mouth daily.    . cyanocobalamin 500 MCG tablet Take 500 mcg by mouth daily.    . furosemide (LASIX) 20 MG tablet Take 1 tablet (20 mg total) by mouth daily. 30 tablet 3  . gabapentin (NEURONTIN) 300 MG capsule TAKE 1 CAPSULE (300 MG TOTAL) BY MOUTH 3 (THREE) TIMES DAILY. 270 capsule 1  . LANTUS 100 UNIT/ML injection INJECT 0.34 MLS (34 UNITS TOTAL) INTO THE SKIN EVERY EVENING. 30 mL 1  . metFORMIN (GLUCOPHAGE-XR) 500 MG 24 hr tablet TAKE 2 TABLETS BY MOUTH DAILY WITH SUPPER. (Patient taking differently: TAKE 2 TABLETS=1000mg  BY MOUTH DAILY WITH SUPPER.) 60 tablet 3  . nitroGLYCERIN (NITROSTAT) 0.4 MG SL tablet Place 1 tablet (0.4 mg total) under the tongue every 5 (five) minutes as needed for chest pain. 25 tablet 3  . NOVOLOG 100 UNIT/ML injection INJECT 5 UNITS INTO THE SKIN 3 TIMES DAILY WITH MEALS 20 mL 1  . spironolactone (ALDACTONE) 25 MG tablet TAKE 1 TABLET (25 MG TOTAL) BY MOUTH DAILY. 30 tablet 3  . VIAGRA 100 MG tablet TAKE 1 TABLET (100 MG TOTAL) BY MOUTH DAILY AS NEEDED FOR ERECTILE DYSFUNCTION. 90 tablet 1    Home: Home Living Family/patient expects to be discharged to:: Inpatient rehab Living Arrangements: Children Available Help at Discharge: Family, Available 24 hours/day Type of Home: House Home Access: Stairs to enter [employer] of Steps: 3 Entrance Stairs-Rails: None Home Layout: One level Bathroom Shower/Tub: Walk-in shower Home Equipment: Cane - quad, Hand held shower head, Grab bars - tub/shower, Shower seat  Functional History: Prior Function Level of Independence: Independent with assistive device(s) Comments: used cane, drove himself, daughter took care of home management Functional Status:  Mobility: Bed Mobility Overal bed mobility: Needs Assistance Bed Mobility: Supine to Sit Supine to sit: Min  assist General bed mobility comments: increased time, especially with scooting to EOB and mod cues for precautions Transfers Overall transfer level: Needs assistance Equipment used: Quad cane Transfers: Sit to/from Stand Sit to Stand: Mod assist General transfer comment: lifting assist from EOB, pulls up on cane despite cues Ambulation/Gait Ambulation/Gait assistance: Mod assist Ambulation Distance (Feet): 3 Feet Assistive device: Quad cane Gait Pattern/deviations: Decreased stride length, Leaning posteriorly, Wide base of support General Gait Details: heavy support posteriorly due to imbalance with pt walking to sink, then pivotal steps with cane to the chair, again with heavy support to prevent posterior LOB    ADL:    Cognition: Cognition Overall Cognitive Status: Within Functional Limits for tasks assessed Orientation Level: Oriented to person, Oriented to place, Oriented to situation Cognition Arousal/Alertness: Awake/alert Behavior During Therapy: [facility] for tasks assessed/performed Overall Cognitive Status: Within Functional Limits for tasks assessed  Blood pressure 113/66, pulse 75, temperature 97.3 F (36.3 C), temperature source Oral, resp. rate 22, height 5\' 9"  (1.753 m), weight 85.5 kg (188 lb 7.9 oz), SpO2 99 %. Physical Exam  Vitals reviewed. Constitutional: He is oriented to person, place, and time. He appears well-developed and well-nourished.  HENT:  Head: Normocephalic and atraumatic.  Eyes: Conjunctivae and EOM are normal.  Neck: Normal range of motion. Neck supple. No thyromegaly present.  Cardiovascular:  Cardiac rate control  Respiratory: Effort  normal and breath sounds normal. No respiratory distress.  GI: Soft. Bowel sounds are normal. He exhibits no distension.  Musculoskeletal: He exhibits no edema or tenderness.  Neurological: He is alert and oriented to person, place, and time.  Motor: 4+/5 throughout Sensation intact to light touch  Skin: Skin  is warm and dry.  Pacemaker site clean and dry with shoulder sling in place  Psychiatric: He has a normal mood and affect. His behavior is normal. Thought content normal.     Lab Results Last 24 Hours    Results for orders placed or performed during the hospital encounter of 11/13/15 (from the past 24 hour(s))  Glucose, capillary Status: Abnormal   Collection Time: 11/16/15 5:07 PM  Result Value Ref Range   Glucose-Capillary 138 (H) 65 - 99 mg/dL   Comment 1 Capillary Specimen   Glucose, capillary Status: Abnormal   Collection Time: 11/16/15 9:36 PM  Result Value Ref Range   Glucose-Capillary 196 (H) 65 - 99 mg/dL   Comment 1 Capillary Specimen   Glucose, capillary Status: Abnormal   Collection Time: 11/17/15 7:33 AM  Result Value Ref Range   Glucose-Capillary 60 (L) 65 - 99 mg/dL  Glucose, capillary Status: Abnormal   Collection Time: 11/17/15 7:54 AM  Result Value Ref Range   Glucose-Capillary 62 (L) 65 - 99 mg/dL   Comment 1 Capillary Specimen   Glucose, capillary Status: Abnormal   Collection Time: 11/17/15 8:34 AM  Result Value Ref Range   Glucose-Capillary 143 (H) 65 - 99 mg/dL   Comment 1 Capillary Specimen       Imaging Results (Last 48 hours)    No results found.    Assessment/Plan: Diagnosis: Debilitation Labs and images independently reviewed. Records reviewed and summated above.  1. Does the need for close, 24 hr/day medical supervision in concert with the patient's rehab needs make it unreasonable for this patient to be served in a less intensive setting? Potentially  2. Co-Morbidities requiring supervision/potential complications: diabetes mellitus with neuropathy (Monitor in accordance with exercise and adjust meds as necessary), HTN (monitor and provide prns in accordance with increased physical exertion and pain), remote syncope, chronic left bundle branch  block, chronic combined systolic diastolic congestive heart failure (Monitor in accordance with increased physical activity and avoid UE resistance excercises), tachypnea (monitor RR and O2 Sats with increased physical exertion), ABLA (transfuse if necessary to ensure appropriate perfusion for increased activity tolerance) 3. Due to safety, skin/wound care, disease management and patient education, does the patient require 24 hr/day rehab nursing? Potentially 4. Does the patient require coordinated care of a physician, rehab nurse, PT (1-2 hrs/day, 5 days/week) and OT (1-2 hrs/day, 5 days/week) to address physical and functional deficits in the context of the above medical diagnosis(es)? Potentially Addressing deficits in the following areas: balance, endurance, locomotion, strength, transferring, toileting and psychosocial support 5. Can the patient actively participate in an intensive therapy program of at least 3 hrs of therapy per day at least 5 days per week? Yes 6. The potential for patient to make measurable gains while on inpatient rehab is excellent 7. Anticipated functional outcomes upon discharge from inpatient rehab are modified independent and supervision with PT, modified independent with OT, n/a with SLP. 8. Estimated rehab length of stay to reach the above functional goals is: 14-18 days. 9. Does the patient have adequate social supports and living environment to accommodate these discharge functional goals? Potentially 10. Anticipated D/C setting: Home 11. Anticipated post D/C treatments: HH therapy  and Home excercise program 12. Overall Rehab/Functional Prognosis: excellent  RECOMMENDATIONS: This patient's condition is appropriate for continued rehabilitative care in the following setting: CIR Patient has agreed to participate in recommended program. Yes Note that insurance prior authorization may be required for reimbursement for recommended care.  Comment: Rehab Admissions  Coordinator to follow up.  Delice Lesch, MD 11/17/2015       Revision History     Date/Time User Provider Type Action   11/17/2015 3:04 PM Ankit Lorie Phenix, MD Physician Sign   11/17/2015 12:42 PM Cathlyn Parsons, PA-C Physician Assistant Pend   View Details Report       Routing History     Date/Time From To Method   11/17/2015 3:04 PM Ankit Lorie Phenix, MD Elayne Snare, MD In [facility]

## 2015-11-18 NOTE — Progress Notes (Signed)
Report given to [facility] in Inpatient rehab.  Pt has no s/s of any acute distress or c/o pain.  AVS summary will be sent with pt.

## 2015-11-18 NOTE — Progress Notes (Signed)
    SUBJECTIVE: The patient is doing well today.  At this time, he denies chest pain, shortness of breath, or any new concerns.  He is open to the idea of going to rehab for strengthening prior to returning home.  Marland Kitchen aspirin  81 mg Oral Daily  . furosemide  20 mg Oral Daily  . gabapentin  300 mg Oral TID  . insulin aspart  0-15 Units Subcutaneous TID WC  . insulin glargine  20 Units Subcutaneous QHS  . irbesartan  75 mg Oral Daily  . metoprolol tartrate  25 mg Oral BID  . sodium chloride flush  3 mL Intravenous Q12H      OBJECTIVE: Physical Exam: Filed Vitals:   11/18/15 0300 11/18/15 0400 11/18/15 0500 11/18/15 0600  BP:  121/71    Pulse: 78 76 76 72  Temp:      TempSrc:      Resp: 0 20 22 16   Height:      Weight:      SpO2: 100% 99% 97% 94%    Intake/Output Summary (Last 24 hours) at 11/18/15 0730 Last data filed at 11/17/15 2300  Gross per 24 hour  Intake    480 ml  Output    825 ml  Net   -345 ml    Telemetry reveals AV paced rhythm,  Well developed and nourished in no acute distress HENT normal Neck supple with JVP-flat Clear Regular rate and rhythm, no murmurs or gallops Abd-soft with active BS No Clubbing cyanosis edema Skin-warm and dry A & Oriented  Grossly normal sensory and motor function  Procedures sites: R groin soft, no hematoma, no bleeding PPM site is dry, no hematoma  LABS: Basic Metabolic Panel:  Recent Labs  11/17/15 1229 11/17/15 2020  NA 139 138  K 4.3 4.5  CL 104 104  CO2 26 25  GLUCOSE 208* 96  BUN 11 13  CREATININE 1.02 1.02  CALCIUM 8.9 8.8*    RADIOLOGY: Dg Chest Port 1 View 11/15/2015  CLINICAL DATA:  82 year old male status post pacemaker placement yesterday. Initial encounter. EXAM: PORTABLE CHEST 1 VIEW COMPARISON:  11/13/2015 and earlier. FINDINGS: Portable AP semi upright view at 0626 hours. Left chest subclavian approach triple lead cardiac pacemaker now in place. Mediastinal contours are stable, normal aside from  mild cardiomegaly. No pneumothorax or pulmonary edema. Allowing for portable technique, the lungs are clear. Stable visualized osseous structures. Lower cervical posterior element fusion hardware re- demonstrated. IMPRESSION: Left chest cardiac pacemaker placed with no acute cardiopulmonary abnormality. Electronically Signed   By: Genevie Ann M.D.   On: 11/15/2015 07:50      ASSESSMENT AND PLAN:  1. Complete heart block  S/p temp wire, pacing >>> CRT-P implant 11/14/15       LVEF 40-45%   2. Chronic combined systolic and diastolic heart failure  back on BB, renal function is improved by lab yesterday will add on ACE      Fluid neg cumulative -2906  3. Diabetes  Continue home meds  4. Functional decline     Pending PT evaluation today for disposition/needs      The patient this morning states he is OK with the idea of going to a rehab     Continue irbersartan     BP weill controlled  For CIR   Home;'rehab when ready

## 2015-11-18 NOTE — H&P (Signed)
Physical Medicine and Rehabilitation Admission H&P    Chief Complaint  Patient presents with  . Weakness  : HPI: [name] is a 82 y.o. right handed male with history of diabetes mellitus with neuropathy, hypertension, remote syncope, chronic left bundle branch block, chronic combined systolic diastolic congestive heart failure. Patient lives with daughter and assistance is needed. Independent with assistive device using quad cane prior to admission and driving short distances. One level home with 3 steps to entry. Presented 11/13/2015 with dizziness/question syncope. Troponin negative. He was found to be in complete heart block. Follow-up with cardiology services. Echocardiogram ejection fraction of 45%. Systolic function was mildly to moderately reduced. Underwent placement of permanent pacemaker 11/14/2015. Physical therapy evaluation completed 11/17/2015 with recommendations of physical medicine rehabilitation consult.Patient was admitted for a comprehensive rehabilitation program  ROS Constitutional: Negative for fever and chills.  HENT: Negative for hearing loss.  Eyes: Negative for blurred vision and double vision.  Respiratory: Negative for cough and shortness of breath.  Gastrointestinal: Positive for constipation. Negative for nausea and vomiting.  Genitourinary: Positive for frequency.  Skin: Negative for rash.  Neurological: Positive for dizziness. Negative for seizures and headaches.  All other systems reviewed and are negative   Past Medical History  Diagnosis Date  . Diabetes mellitus   . ED (erectile dysfunction)   . Hypertension   . Hyperlipidemia   . Pneumonia     hx of  . CHF (congestive heart failure) ([place])   . Urination frequency   . Cancer ([place])     hx of prostate ca  . Neuromuscular disorder ([facility])     numbness in hand/cervical issues  . Cataracts, bilateral     hx of  . Syncope     hx of   Past Surgical History  Procedure Laterality Date  .  Hernia repair    . Tonsillectomy    . Eye surgery      cataract surgery bilateral  . Small intestine surgery      hx of  . Prostate surgery      s/p ca  . Posterior cervical fusion/foraminotomy  11/18/2011    Procedure: POSTERIOR CERVICAL FUSION/FORAMINOTOMY LEVEL 1;  Surgeon: Eustace Moore, MD;  Location: [facility];  Service: Neurosurgery;  Laterality: Bilateral;  . Cardiac catheterization N/A 11/13/2015    Procedure: Temporary Pacemaker;  Surgeon: Lorretta Harp, MD;  Location: [facility];  Service: Cardiovascular;  Laterality: N/A;  . Ep implantable device N/A 11/14/2015    Procedure: BiV Pacemaker Insertion CRT-P;  Surgeon: Deboraha Sprang, MD;  Location: [facility];  Service: Cardiovascular;  Laterality: N/A;   Family History  Problem Relation Age of Onset  . Diabetes Mother   . Hypertension Father    Social History:  reports that he has never smoked. He has never used smokeless tobacco. He reports that he does not drink alcohol or use illicit drugs. Allergies: No Known Allergies Medications Prior to Admission  Medication Sig Dispense Refill  . aspirin 81 MG chewable tablet Chew 81 mg by mouth daily.    Marland Kitchen atorvastatin (LIPITOR) 40 MG tablet TAKE 1 TABLET (40 MG TOTAL) BY MOUTH DAILY. 90 tablet 1  . calcium-vitamin D (OSCAL WITH D) 500-200 MG-UNIT per tablet Take 1 tablet by mouth daily.    . carvedilol (COREG) 12.5 MG tablet Take 1 tablet (12.5 mg total) by mouth 2 (two) times daily with a meal. (Patient taking differently: Take 12.5-25 mg  by mouth See admin instructions. Pt takes 12.108m in am and 286min pm) 60 tablet 11  . cholecalciferol (VITAMIN D) 1000 UNITS tablet Take 1,000 Units by mouth daily.    . cyanocobalamin 500 MCG tablet Take 500 mcg by mouth daily.    . furosemide (LASIX) 20 MG tablet Take 1 tablet (20 mg total) by mouth daily. 30 tablet 3  . gabapentin (NEURONTIN) 300 MG capsule TAKE 1 CAPSULE (300 MG TOTAL) BY MOUTH 3 (THREE) TIMES DAILY. 270  capsule 1  . LANTUS 100 UNIT/ML injection INJECT 0.34 MLS (34 UNITS TOTAL) INTO THE SKIN EVERY EVENING. 30 mL 1  . metFORMIN (GLUCOPHAGE-XR) 500 MG 24 hr tablet TAKE 2 TABLETS BY MOUTH DAILY WITH SUPPER. (Patient taking differently: TAKE 2 TABLETS=100030mY MOUTH DAILY WITH SUPPER.) 60 tablet 3  . nitroGLYCERIN (NITROSTAT) 0.4 MG SL tablet Place 1 tablet (0.4 mg total) under the tongue every 5 (five) minutes as needed for chest pain. 25 tablet 3  . NOVOLOG 100 UNIT/ML injection INJECT 5 UNITS INTO THE SKIN 3 TIMES DAILY WITH MEALS 20 mL 1  . spironolactone (ALDACTONE) 25 MG tablet TAKE 1 TABLET (25 MG TOTAL) BY MOUTH DAILY. 30 tablet 3  . VIAGRA 100 MG tablet TAKE 1 TABLET (100 MG TOTAL) BY MOUTH DAILY AS NEEDED FOR ERECTILE DYSFUNCTION. 90 tablet 1    Home: Home Living Family/patient expects to be discharged to:: Inpatient rehab Living Arrangements: Children Available Help at Discharge: Family, Available 24 hours/day Type of Home: House Home Access: Stairs to enter [employer] Steps: 3 Entrance Stairs-Rails: None Home Layout: One level Bathroom Shower/Tub: Walk-in shower Home Equipment: Cane - quad, Hand held shower head, Grab bars - tub/shower, Shower seat   Functional History: Prior Function Level of Independence: Independent with assistive device(s) Comments: used cane, drove himself, daughter took care of home management  Functional Status:  Mobility: Bed Mobility Overal bed mobility: Needs Assistance Bed Mobility: Supine to Sit Supine to sit: Min assist General bed mobility comments: increased time, especially with scooting to EOB and mod cues for precautions Transfers Overall transfer level: Needs assistance Equipment used: Quad cane Transfers: Sit to/from Stand Sit to Stand: Mod assist General transfer comment: lifting assist from EOB, pulls up on cane despite cues Ambulation/Gait Ambulation/Gait assistance: Mod assist Ambulation Distance (Feet): 3  Feet Assistive device: Quad cane Gait Pattern/deviations: Decreased stride length, Leaning posteriorly, Wide base of support General Gait Details: heavy support posteriorly due to imbalance with pt walking to sink, then pivotal steps with cane to the chair, again with heavy support to prevent posterior LOB    ADL:    Cognition: Cognition Overall Cognitive Status: Within Functional Limits for tasks assessed Orientation Level: Oriented to person, Oriented to place, Oriented to situation Cognition Arousal/Alertness: Awake/alert Behavior During Therapy: WFL for tasks assessed/performed Overall Cognitive Status: Within Functional Limits for tasks assessed  Physical Exam: Blood pressure 121/71, pulse 72, temperature 98 F (36.7 C), temperature source Oral, resp. rate 16, height 5' 9" (1.753 m), weight 85.5 kg (188 lb 7.9 oz), SpO2 94 %. Physical Exam Constitutional: He is oriented to person, place, and time. He appears well-developed and well-nourished.  HENT:  Head: Normocephalic and atraumatic.  Eyes: Conjunctivae and EOM are normal.  Neck: Normal range of motion. Neck supple. No thyromegaly present.  Cardiovascular:  Cardiac rate control  Respiratory: Effort normal and breath sounds normal. No respiratory distress.  GI: Soft. Bowel sounds are normal. He exhibits no distension.  Musculoskeletal: He  exhibits no edema or tenderness.  Neurological: He is alert and oriented to person, place, and time.  Motor: 4+/5 throughout Sensation intact to light touch  Skin: Skin is warm and dry.  Pacemaker site clean and dry with shoulder sling in place  Psychiatric: He has a normal mood and affect. His behavior is normal. Thought content normal   Results for orders placed or performed during the hospital encounter of 11/13/15 (from the past 48 hour(s))  Glucose, capillary     Status: None   Collection Time: 11/16/15  8:08 AM  Result Value Ref Range   Glucose-Capillary 77 65 - 99 mg/dL    Comment 1 Capillary Specimen   Basic metabolic panel     Status: Abnormal   Collection Time: 11/16/15 11:11 AM  Result Value Ref Range   Sodium 136 135 - 145 mmol/L   Potassium 5.2 (H) 3.5 - 5.1 mmol/L    Comment: HEMOLYSIS AT THIS LEVEL MAY AFFECT RESULT   Chloride 107 101 - 111 mmol/L   CO2 21 (L) 22 - 32 mmol/L   Glucose, Bld 178 (H) 65 - 99 mg/dL   BUN 13 6 - 20 mg/dL   Creatinine, Ser 1.11 0.61 - 1.24 mg/dL   Calcium 8.7 (L) 8.9 - 10.3 mg/dL   GFR calc non Af Amer 60 (L) >60 mL/min   GFR calc Af Amer >60 >60 mL/min    Comment: (NOTE) The eGFR has been calculated using the CKD EPI equation. This calculation has not been validated in all clinical situations. eGFR's persistently <60 mL/min signify possible Chronic Kidney Disease.    Anion gap 8 5 - 15  Glucose, capillary     Status: Abnormal   Collection Time: 11/16/15 12:02 PM  Result Value Ref Range   Glucose-Capillary 180 (H) 65 - 99 mg/dL   Comment 1 Capillary Specimen   Glucose, capillary     Status: Abnormal   Collection Time: 11/16/15  5:07 PM  Result Value Ref Range   Glucose-Capillary 138 (H) 65 - 99 mg/dL   Comment 1 Capillary Specimen   Glucose, capillary     Status: Abnormal   Collection Time: 11/16/15  9:36 PM  Result Value Ref Range   Glucose-Capillary 196 (H) 65 - 99 mg/dL   Comment 1 Capillary Specimen   Glucose, capillary     Status: Abnormal   Collection Time: 11/17/15  7:33 AM  Result Value Ref Range   Glucose-Capillary 60 (L) 65 - 99 mg/dL  Glucose, capillary     Status: Abnormal   Collection Time: 11/17/15  7:54 AM  Result Value Ref Range   Glucose-Capillary 62 (L) 65 - 99 mg/dL   Comment 1 Capillary Specimen   Glucose, capillary     Status: Abnormal   Collection Time: 11/17/15  8:34 AM  Result Value Ref Range   Glucose-Capillary 143 (H) 65 - 99 mg/dL   Comment 1 Capillary Specimen   Glucose, capillary     Status: Abnormal   Collection Time: 11/17/15 12:06 PM  Result Value Ref Range    Glucose-Capillary 252 (H) 65 - 99 mg/dL   Comment 1 Capillary Specimen   Basic metabolic panel     Status: Abnormal   Collection Time: 11/17/15 12:29 PM  Result Value Ref Range   Sodium 139 135 - 145 mmol/L   Potassium 4.3 3.5 - 5.1 mmol/L   Chloride 104 101 - 111 mmol/L   CO2 26 22 - 32 mmol/L   Glucose, Bld 208 (  H) 65 - 99 mg/dL   BUN 11 6 - 20 mg/dL   Creatinine, Ser 1.02 0.61 - 1.24 mg/dL   Calcium 8.9 8.9 - 10.3 mg/dL   GFR calc non Af Amer >60 >60 mL/min   GFR calc Af Amer >60 >60 mL/min    Comment: (NOTE) The eGFR has been calculated using the CKD EPI equation. This calculation has not been validated in all clinical situations. eGFR's persistently <60 mL/min signify possible Chronic Kidney Disease.    Anion gap 9 5 - 15  Glucose, capillary     Status: Abnormal   Collection Time: 11/17/15  4:48 PM  Result Value Ref Range   Glucose-Capillary 143 (H) 65 - 99 mg/dL   Comment 1 Capillary Specimen   Basic metabolic panel     Status: Abnormal   Collection Time: 11/17/15  8:20 PM  Result Value Ref Range   Sodium 138 135 - 145 mmol/L   Potassium 4.5 3.5 - 5.1 mmol/L   Chloride 104 101 - 111 mmol/L   CO2 25 22 - 32 mmol/L   Glucose, Bld 96 65 - 99 mg/dL   BUN 13 6 - 20 mg/dL   Creatinine, Ser 1.02 0.61 - 1.24 mg/dL   Calcium 8.8 (L) 8.9 - 10.3 mg/dL   GFR calc non Af Amer >60 >60 mL/min   GFR calc Af Amer >60 >60 mL/min    Comment: (NOTE) The eGFR has been calculated using the CKD EPI equation. This calculation has not been validated in all clinical situations. eGFR's persistently <60 mL/min signify possible Chronic Kidney Disease.    Anion gap 9 5 - 15  CBC     Status: Abnormal   Collection Time: 11/17/15  8:20 PM  Result Value Ref Range   WBC 7.0 4.0 - 10.5 K/uL   RBC 4.20 (L) 4.22 - 5.81 MIL/uL   Hemoglobin 13.1 13.0 - 17.0 g/dL   HCT 40.1 39.0 - 52.0 %   MCV 95.5 78.0 - 100.0 fL   MCH 31.2 26.0 - 34.0 pg   MCHC 32.7 30.0 - 36.0 g/dL   RDW 14.7 11.5 - 15.5 %     Platelets 163 150 - 400 K/uL  Glucose, capillary     Status: None   Collection Time: 11/17/15  9:08 PM  Result Value Ref Range   Glucose-Capillary 99 65 - 99 mg/dL   Comment 1 Capillary Specimen    No results found.  Medical Problem List and Plan: 1.  Debilitation secondary to complete heart block status post pacemaker/multi-medical 2.  DVT Prophylaxis/Anticoagulation: SCDs. Monitor for any signs of DVT 3. Pain Management: Neurontin 300 mg 3 times a day 4. Hypertension. Avapro 75 mg daily, Lopressor 25 mg twice a day. Monitor with increased mobility 5. Neuropsych: This patient is capable of making decisions on his own behalf. 6. Skin/Wound Care: Routine skin checks 7. Fluids/Electrolytes/Nutrition: Routine I&O's with follow-up chemistries 8. Chronic combined systolic and diastolic heart failure. Lasix 20 mg daily. Monitor for any signs of fluid overload 9. Diabetes mellitus with peripheral neuropathy. Hemoglobin A1c 7.3. Lantus insulin 20 units daily at bedtime. Check blood sugars before meals and at bedtime. Diabetic teaching 10. LBBB: Monitor 11. ABLA: Follow up CBC 12. Tachycardia: Likely due to deconditioning. Cont to monitor.  32. Tachpnea: Likely due to deconditioning.  Cont to monitor.  Post Admission Physician Evaluation: 1. Functional deficits secondary  to complete heart block status post pacemaker/multi-medical. 2. Patient is admitted to receive collaborative, interdisciplinary care between  the physiatrist, rehab nursing staff, and therapy team. 3. Patient's level of medical complexity and substantial therapy needs in context of that medical necessity cannot be provided at a lesser intensity of care such as a SNF. 4. Patient has experienced substantial functional loss from his/her baseline which was documented above under the "Functional History" and "Functional Status" headings.  Judging by the patient's diagnosis, physical exam, and functional history, the patient has  potential for functional progress which will result in measurable gains while on inpatient rehab.  These gains will be of substantial and practical use upon discharge  in facilitating mobility and self-care at the household level. 5. Physiatrist will provide 24 hour management of medical needs as well as oversight of the therapy plan/treatment and provide guidance as appropriate regarding the interaction of the two. 6. 24 hour rehab nursing will assist with safety, skin/wound care, disease management and patient education and help integrate therapy concepts, techniques,education, etc. 7. PT will assess and treat for/with: Lower extremity strength, range of motion, stamina, balance, functional mobility, safety, adaptive techniques and equipment, woundcare, coping skills, pain control, education.   Goals are: Mod I/Supervision. 8. OT will assess and treat for/with: ADL's, functional mobility, safety, upper extremity strength, adaptive techniques and equipment, wound mgt, ego support, and community reintegration.   Goals are: Mod I/Supervision. Therapy may not proceed with showering this patient. 9. Case Management and Social Worker will assess and treat for psychological issues and discharge planning. 10. Team conference will be held weekly to assess progress toward goals and to determine barriers to discharge. 11. Patient will receive at least 3 hours of therapy per day at least 5 days per week. 12. ELOS: 14-18 days.       13. Prognosis:  good  Delice Lesch, MD 11/18/2015

## 2015-11-18 NOTE — Discharge Summary (Signed)
ELECTROPHYSIOLOGY PROCEDURE DISCHARGE SUMMARY    Patient ID: [name],  MRN: [id number], DOB/AGE: December 21, 1930 82 y.o.  Admit date: 11/13/2015 Discharge date: 11/18/2015  Primary Care Physician: Elayne Snare, MD Primary Cardiologist: Dr. Tamala Julian Electrophysiologist: Dr. Caryl Comes  Primary Discharge Diagnosis:  1. Symptomatic CHB     status post pacemaker implantation this admission  Secondary Discharge Diagnosis:  1. DM 2. HTN 3. LBBB 4. CM  No Known Allergies   Procedures This Admission:  1. Temporary pacemaker via R groin/femoral vein 11/13/15, Dr. Gwenlyn Found 2.  Implantation of a SJM CRT- PPM on 11/14/15 by Dr Caryl Comes.  The patient received aSt Jude 1200 ventricular lead serial [id number], St Jude atrial lead serial number [id number], and aSt Jude [id number] LC/CS lead, and St Jude pulse generator serial number [id number].   There were no immediate post procedure complications.     Removal of temporary pacing wire 3.  CXR on 11/15/15 demonstrated no pneumothorax status post device implantation.   Brief HPI: [name] is a 82 y.o. male was admitted with symptomatic bradycardia, with c/o dizziness, no syncope, noted to be in CHB, his required temporary pacing wire, his home carvedilol held and subsequently PPM implanted.  Hospital Course:  The patient was admitted and underwent implantation of a first a temporary pacing wire to alow tome for his home coreg to wash out, after 5 half lives he remained with CHB.  Echo showed his LVEF to be 40-45% and CRT-P was implanted on 11/14/15 by Dr. Caryl Comes.  The patient daughter reported significant concerns about her continued ability to continue to care adequately for him though the patient was very resistant to home health help.  Nursing staff noted him to require significant assistance, PT was consulted and recommended in-patient rehab, this the patient is agreeable to, and reports looking forward to getting PT/rehab and stronger. He was resumed  on BB, though metoprolol secondary to some observed NSVT over the weekend, none further have been noted, and yesterday his ARB restarted as well.  Telemetry is AV paced, BP/vitals stable.  Left chest was without hematoma or ecchymosis.  R groin is soft, no hematoma.  The device was interrogated post-implant day one and found to be functioning normally.  CXR was obtained 11/15/15 and demonstrated no pneumothorax status post device implantation.  Diabetes coordinator was on the case and aided in DM management will continue with their recommendations going forward and defer ongoing DM management to rehab team.  Wound care, arm mobility, and restrictions were re-reviewed with the patient today.   The patient was examined by physical medicine and felt to be a good candidate for CIR, he was examined by Dr. Caryl Comes today and considered stable for discharge to rehab when a bed is available.    Physical Exam: Filed Vitals:   11/18/15 0900 11/18/15 1000 11/18/15 1100 11/18/15 1125  BP:    120/78  Pulse: 70 110 69 77  Temp:    98.2 F (36.8 C)  TempSrc:    Oral  Resp: 25 25 19 17   Height:      Weight:      SpO2: 100% 95% 99% 99%      Labs:   Lab Results  Component Value Date   WBC 7.0 11/17/2015   HGB 13.1 11/17/2015   HCT 40.1 11/17/2015   MCV 95.5 11/17/2015   PLT 163 11/17/2015     Recent Labs Lab 11/17/15 2020  NA 138  K 4.5  CL 104  CO2 25  BUN 13  CREATININE 1.02  CALCIUM 8.8*  GLUCOSE 96    Discharge Medications:    Medication List    STOP taking these medications        carvedilol 12.5 MG tablet  Commonly known as:  COREG     spironolactone 25 MG tablet  Commonly known as:  ALDACTONE     VIAGRA 100 MG tablet  Generic drug:  sildenafil      TAKE these medications        aspirin 81 MG chewable tablet  Chew 81 mg by mouth daily.     atorvastatin 40 MG tablet  Commonly known as:  LIPITOR  TAKE 1 TABLET (40 MG TOTAL) BY MOUTH DAILY.     calcium-vitamin D  500-200 MG-UNIT tablet  Commonly known as:  OSCAL WITH D  Take 1 tablet by mouth daily.     cholecalciferol 1000 units tablet  Commonly known as:  VITAMIN D  Take 1,000 Units by mouth daily.     cyanocobalamin 500 MCG tablet  Take 500 mcg by mouth daily.     furosemide 20 MG tablet  Commonly known as:  LASIX  Take 1 tablet (20 mg total) by mouth daily.     gabapentin 300 MG capsule  Commonly known as:  NEURONTIN  TAKE 1 CAPSULE (300 MG TOTAL) BY MOUTH 3 (THREE) TIMES DAILY.     insulin aspart 100 UNIT/ML injection  Commonly known as:  NOVOLOG  Inject 3 Units into the skin 3 (three) times daily with meals.     insulin glargine 100 UNIT/ML injection  Commonly known as:  LANTUS  Inject 0.18 mLs (18 Units total) into the skin at bedtime.     irbesartan 75 MG tablet  Commonly known as:  AVAPRO  Take 1 tablet (75 mg total) by mouth daily.     metFORMIN 500 MG 24 hr tablet  Commonly known as:  GLUCOPHAGE-XR  TAKE 2 TABLETS BY MOUTH DAILY WITH SUPPER.     metoprolol tartrate 25 MG tablet  Commonly known as:  LOPRESSOR  Take 1 tablet (25 mg total) by mouth 2 (two) times daily.     nitroGLYCERIN 0.4 MG SL tablet  Commonly known as:  NITROSTAT  Place 1 tablet (0.4 mg total) under the tongue every 5 (five) minutes as needed for chest pain.        Disposition:  CIR  Follow-up Information    Follow up with [facility] On 11/20/2015.   Specialty:  Cardiology   Why:  10:00AM, wound check   Contact information:   [address] [place] [phone]      Follow up with Virl Axe, MD On 02/24/2016.   Specialty:  Cardiology   Why:  2:00PM   Contact information:   [address] [place] [address] [phone]       Duration of Discharge Encounter: Greater than 30 minutes including physician time.  Venetia Night, PA-C 11/18/2015 12:13 PM

## 2015-11-18 NOTE — Clinical Social Work Note (Signed)
Patient to be discharged to CIR. LCSW signing off.  Lubertha Sayres, [facility]: [phone] Surgical: [phone]

## 2015-11-18 NOTE — Progress Notes (Signed)
Pt transferred to inpt rehab at this time.  Message left for dtr, Willette Cluster re: transfer.

## 2015-11-18 NOTE — Progress Notes (Signed)
I met with the patient at the bedside to discuss the recommendation for CIR.  I provided information about the program and answered pt's questions.  Pt. would like to receive his rehab care on CIR.  I have updated Elissa Hefty, RNCM.   Debbie  spoke with Tommye Standard, PA who gives medical clearance for admission today.  Pt's RN is also updated of the plan.  I will make arrangements for pt. to be admitted later today.  Please call if questions.  [facility] [place] Admissions Coordinator Cell [phone] Office [phone]

## 2015-11-18 NOTE — Progress Notes (Signed)
[name] Rehab Admission Coordinator Signed Physical Medicine and Rehabilitation PMR Pre-admission 11/18/2015 1:12 PM  Related encounter: ED to Hosp-Admission (Discharged) from 11/13/2015 in [place] Collapse All   PMR Admission Coordinator Pre-Admission Assessment  Patient: [name] is an 82 y.o., male MRN: [id number] DOB: 1930-06-23 Height: 5' 9" (175.3 cm) Weight: 85.5 kg (188 lb 7.9 oz)  Insurance Information HMO: PPO: PCP: IPA: 80/20: OTHER:  PRIMARY: Medicare A and B Policy#: [id number] Subscriber: self CM Name: Phone#: Fax#:  Pre-Cert#: Employer: retired Benefits: Phone #: Name:  Eff. Date: A and B 11/07/97 Deduct: $1316 Out of Pocket Max: Life Max: none CIR: 100% after deductible met SNF: 100% first 20 days Outpatient: 80% Co-Pay: 20% Home Health: 100% Co-Pay:  DME: 80% Co-Pay: 20% Providers: Pt. choice SECONDARY: Policy#: Subscriber:  CM Name: Phone#: Fax#:  Pre-Cert#: Employer:  Benefits: Phone #: Name:  Eff. Date: Deduct: Out of Pocket Max: Life Max:  CIR: SNF:  Outpatient: Co-Pay:  Home Health: Co-Pay:  DME: Co-Pay:   Medicaid Application Date: Case Manager:  Disability Application Date: Case Worker:   Emergency Contact Information Contact Information    Name Relation Home Work Mobile   Kmetz,Lynnette Daughter [phone]    Dorna Mai [phone]       Current Medical History  Patient Admitting Diagnosis: Debilitation  History of Present Illness: [name] is a 82 y.o. right handed male with  history of diabetes mellitus with neuropathy, hypertension, remote syncope, chronic left bundle branch block, chronic combined systolic diastolic congestive heart failure. Patient lives with daughter and assistance is needed. Independent with assistive device using quad cane prior to admission and driving short distances. One level home with 3 steps to entry. Presented 11/13/2015 with dizziness/question syncope. Troponin negative. He was found to be in complete heart block. Follow-up with cardiology services. Echocardiogram ejection fraction of 45%. Systolic function was mildly to moderately reduced. Underwent placement of permanent pacemaker 11/14/2015. Physical therapy evaluation completed 11/17/2015 with recommendations of physical medicine rehabilitation consult.Patient was admitted for a comprehensive rehabilitation program      Past Medical History  Past Medical History  Diagnosis Date  . Diabetes mellitus   . ED (erectile dysfunction)   . Hypertension   . Hyperlipidemia   . Pneumonia     hx of  . CHF (congestive heart failure) ([place])   . Urination frequency   . Cancer ([place])     hx of prostate ca  . Neuromuscular disorder ([facility])     numbness in hand/cervical issues  . Cataracts, bilateral     hx of  . Syncope     hx of    Family History  family history includes Diabetes in his mother; Hypertension in his father.  Prior Rehab/Hospitalizations:  Has the patient had major surgery during 100 days prior to admission? No  Current Medications   Current facility-administered medications:  . 0.9 % sodium chloride infusion, 250 mL, Intravenous, PRN, Patsey Berthold, NP . acetaminophen (TYLENOL) tablet 325-650 mg, 325-650 mg, Oral, Q4H PRN, Deboraha Sprang, MD . acetaminophen (TYLENOL) tablet 650 mg, 650 mg, Oral, Q4H PRN, Patsey Berthold, NP, 650 mg at 11/17/15 0858 . aspirin chewable tablet 81 mg, 81 mg, Oral, Daily, Patsey Berthold, NP, 81 mg at 11/18/15 0831 . furosemide (LASIX) tablet 20 mg, 20 mg, Oral, Daily, Patsey Berthold, NP, 20 mg at 11/18/15 0831 . gabapentin (NEURONTIN) capsule 300  mg, 300 mg, Oral, TID, Patsey Berthold, NP, 300 mg at 11/18/15 0092 . insulin aspart (novoLOG) injection 0-15 Units, 0-15 Units, Subcutaneous, TID WC, Patsey Berthold, NP, 2 Units at 11/18/15 1128 . insulin glargine (LANTUS) injection 20 Units, 20 Units, Subcutaneous, QHS, Renee Dyane Dustman, PA-C, 20 Units at 11/17/15 2137 . irbesartan (AVAPRO) tablet 75 mg, 75 mg, Oral, Daily, Deboraha Sprang, MD, 75 mg at 11/18/15 3300 . metoprolol tartrate (LOPRESSOR) tablet 25 mg, 25 mg, Oral, BID, Skeet Latch, MD, 25 mg at 11/18/15 0831 . ondansetron (ZOFRAN) injection 4 mg, 4 mg, Intravenous, Q6H PRN, Patsey Berthold, NP . ondansetron (ZOFRAN) injection 4 mg, 4 mg, Intravenous, Q6H PRN, Deboraha Sprang, MD . sodium chloride flush (NS) 0.9 % injection 3 mL, 3 mL, Intravenous, Q12H, Patsey Berthold, NP, 3 mL at 11/18/15 0832 . sodium chloride flush (NS) 0.9 % injection 3 mL, 3 mL, Intravenous, PRN, Patsey Berthold, NP  Patients Current Diet: Diet heart healthy/carb modified Room service appropriate?: Yes; Fluid consistency:: Thin  Precautions / Restrictions Precautions Precautions: Fall, ICD/Pacemaker Restrictions Weight Bearing Restrictions: No   Has the patient had 2 or more falls or a fall with injury in the past year?Yes; fell once several months ago and scraped his nose  Prior Activity Level Community (5-7x/wk): Pt. reports that he drives and goes out to breakfast most mornings. Otherwise, his driving is limited and his daughter takes him to his MD appointments  Home Assistive Devices / [place] Devices/Equipment: None Home Equipment: Cane - quad, Hand held shower head, Grab bars - tub/shower, Shower seat  Prior Device Use: Indicate devices/aids used by the patient prior to current illness, exacerbation  or injury? quad cane  Prior Functional Level Prior Function Level of Independence: Independent with assistive device(s) Comments: used cane, drove himself, daughter took care of home management  Self Care: Did the patient need help bathing, dressing, using the toilet or eating? Independent  Indoor Mobility: Did the patient need assistance with walking from room to room (with or without device)? Independent  Stairs: Did the patient need assistance with internal or external stairs (with or without device)? Independent  Functional Cognition: Did the patient need help planning regular tasks such as shopping or remembering to take medications? Independent  Current Functional Level Cognition  Overall Cognitive Status: Within Functional Limits for tasks assessed Orientation Level: Oriented to person, Oriented to place, Oriented to situation   Extremity Assessment (includes Sensation/Coordination)  Upper Extremity Assessment: LUE deficits/detail LUE Deficits / Details: NT due to Pacemaker precautions, moves independently, however with cues needed for precautions and assist to adjust sling  Lower Extremity Assessment: RLE deficits/detail, LLE deficits/detail RLE Deficits / Details: stiffness in ankles, pt reports intact sensation, but question proprioceptive changes with h/o DM LLE Deficits / Details: stiffness in ankles, pt reports intact sensation, but question proprioceptive changes with h/o DM    ADLs       Mobility  Overal bed mobility: Needs Assistance Bed Mobility: Supine to Sit Supine to sit: Min assist General bed mobility comments: increased time, especially with scooting to EOB and mod cues for precautions    Transfers  Overall transfer level: Needs assistance Equipment used: Quad cane Transfers: Sit to/from Stand Sit to Stand: Mod assist General transfer comment: lifting assist from EOB, pulls up on cane despite cues    Ambulation / Gait / Stairs /  Wheelchair Mobility  Ambulation/Gait Ambulation/Gait assistance: Mod assist Ambulation Distance (Feet): 3 Feet Assistive  device: Quad cane Gait Pattern/deviations: Decreased stride length, Leaning posteriorly, Wide base of support General Gait Details: heavy support posteriorly due to imbalance with pt walking to sink, then pivotal steps with cane to the chair, again with heavy support to prevent posterior LOB    Posture / Balance Balance Overall balance assessment: Needs assistance Sitting-balance support: Feet unsupported, Feet supported Sitting balance-Leahy Scale: Fair Postural control: Posterior lean Standing balance support: Single extremity supported Standing balance-Leahy Scale: Poor Standing balance comment: standing at sink for ant weight shifts with heel raises x 5 with R UE support on sink, improved to allow close S to minguard A standing without UE support, but posterior once again when backing up and shifting to recliner    Special needs/care consideration BiPAP/CPAP no CPM no Continuous Drip IV no Dialysis no  Life Vest no Oxygen no Special Bed no Trach Size no Wound Vac (area) no  Skin WDL per nursing assessment  Bowel mgmt: Last BM 11/13/15, continent Bladder mgmt: using urinal, continent Diabetic mgmt yes, on insulin at home per pt.     Previous Home Environment Living Arrangements: Children Available Help at Discharge: Family, Available 24 hours/day Type of Home: House Home Layout: One level Home Access: Stairs to enter Entrance Stairs-Rails: None Entrance Stairs-Number of Steps: 3 Bathroom Shower/Tub: Gaffer Home Care Services: No  Discharge Living Setting Plans for Discharge Living Setting: Patient's home Type of Home at Discharge: House Discharge Home Layout: One level Discharge Home Access: Stairs to enter Entrance Stairs-Rails: None Entrance Stairs-Number of Steps:  3 Discharge Bathroom Shower/Tub: Walk-in shower Discharge Bathroom Toilet: Standard Discharge Bathroom Accessibility: Yes How Accessible: Accessible via walker Does the patient have any problems obtaining your medications?: No  Social/Family/Support Systems Patient Roles: Parent Anticipated Caregiver: Daughter, Daiveon Markman, [phone] Ability/Limitations of Caregiver: Per pt, no restrictions Caregiver Availability: 24/7 Discharge Plan Discussed with Primary Caregiver: No Does Caregiver/Family have Issues with Lodging/Transportation while Pt is in Rehab?: No   Goals/Additional Needs Patient/Family Goal for Rehab: modified independent and supervision PT; modified independent OT; n/a SLP Expected length of stay: 14-18 days Cultural Considerations: none Dietary Needs: heart healthy, carb modified, thin liquids Equipment Needs: TBA Pt/Family Agrees to Admission and willing to participate: Yes Program Orientation Provided & Reviewed with Pt/Caregiver Including Roles & Responsibilities: Yes Additional Information Needs: I attempted to reach pt's daughter by phone on several occasions to update her on the plan for CIR. She did not answer and her voicemail box was not set up.   Decrease burden of Care through IP rehab admission: n/a   Possible need for SNF placement upon discharge: Not anticipated   Patient Condition: This patient's medical and functional status has changed since the consult dated 11/17/15 in which the Rehabilitation Physician determined and documented that the patient was potentially appropriate for intensive rehabilitative care in an inpatient rehabilitation facility. Issues have been addressed and update has been discussed with Dr. Posey Pronto and patient now appropriate for inpatient rehabilitation. Will admit to inpatient rehab today.   Preadmission Screen Completed By: [name], 11/18/2015 1:23  PM ______________________________________________________________________  Discussed status with Dr. Posey Pronto on 11/18/15 at 1322 and received telephone approval for admission today.  Admission Coordinator: [name], time 7867 /Date 11/18/15          Cosigned by: Ankit Lorie Phenix, MD at 11/18/2015 1:25 PM  Revision History     Date/Time User Provider Type Action   11/18/2015 1:25 PM Ankit Lorie Phenix, MD Physician Cosign   11/18/2015  1:23 PM [name] Rehab Admission Coordinator Sign

## 2015-11-18 NOTE — PMR Pre-admission (Signed)
PMR Admission Coordinator Pre-Admission Assessment  Patient: [name] is an 82 y.o., male MRN: [id number] DOB: 12/11/1930 Height: 5' 9"  (175.3 cm) Weight: 85.5 kg (188 lb 7.9 oz)              Insurance Information HMO:     PPO:      PCP:      IPA:      80/20:      OTHER:  PRIMARY: Medicare A and B      Policy#: [id number]      Subscriber:  self CM Name:        Phone#:      Fax#:  Pre-Cert#:       Employer: retired Benefits:  Phone #:      Name:  Eff. Date:  A and B 11/07/97     Deduct:  $1316      Out of Pocket Max:        Life Max: none CIR:  100% after deductible met      SNF:  100% first 20 days Outpatient:  80%     Co-Pay: 20% Home Health:  100%      Co-Pay:   DME:  80%     Co-Pay:  20% Providers:  Pt. choice SECONDARY:       Policy#:       Subscriber:  CM Name:       Phone#:      Fax#:  Pre-Cert#:       Employer:  Benefits:  Phone #:      Name:  Eff. Date:      Deduct:       Out of Pocket Max:       Life Max:  CIR:       SNF:  Outpatient:      Co-Pay:  Home Health:       Co-Pay:  DME:      Co-Pay:   Medicaid Application Date:       Case Manager:  Disability Application Date:       Case Worker:   Emergency Contact Information Contact Information    Name Relation Home Work Mobile   [name],[name] Daughter [phone]    [name] [phone]       Current Medical History  Patient Admitting Diagnosis: Debilitation  History of Present Illness: [name] is a 82 y.o. right handed male with history of diabetes mellitus with neuropathy, hypertension, remote syncope, chronic left bundle branch block, chronic combined systolic diastolic congestive heart failure. Patient lives with daughter and assistance is needed. Independent with assistive device using quad cane prior to admission and driving short distances. One level home with 3 steps to entry. Presented 11/13/2015 with dizziness/question syncope. Troponin negative. He was found to be in  complete heart block. Follow-up with cardiology services. Echocardiogram ejection fraction of 45%. Systolic function was mildly to moderately reduced. Underwent placement of permanent pacemaker 11/14/2015. Physical therapy evaluation completed 11/17/2015 with recommendations of physical medicine rehabilitation consult.Patient was admitted for a comprehensive rehabilitation program      Past Medical History  Past Medical History  Diagnosis Date  . Diabetes mellitus   . ED (erectile dysfunction)   . Hypertension   . Hyperlipidemia   . Pneumonia     hx of  . CHF (congestive heart failure) ([place])   . Urination frequency   . Cancer ([place])     hx of prostate ca  . Neuromuscular disorder ([place])  numbness in hand/cervical issues  . Cataracts, bilateral     hx of  . Syncope     hx of    Family History  family history includes Diabetes in his mother; Hypertension in his father.  Prior Rehab/Hospitalizations:  Has the patient had major surgery during 100 days prior to admission? No  Current Medications   Current facility-administered medications:  .  0.9 %  sodium chloride infusion, 250 mL, Intravenous, PRN, Patsey Berthold, NP .  acetaminophen (TYLENOL) tablet 325-650 mg, 325-650 mg, Oral, Q4H PRN, Deboraha Sprang, MD .  acetaminophen (TYLENOL) tablet 650 mg, 650 mg, Oral, Q4H PRN, Patsey Berthold, NP, 650 mg at 11/17/15 0858 .  aspirin chewable tablet 81 mg, 81 mg, Oral, Daily, Patsey Berthold, NP, 81 mg at 11/18/15 0831 .  furosemide (LASIX) tablet 20 mg, 20 mg, Oral, Daily, Patsey Berthold, NP, 20 mg at 11/18/15 0831 .  gabapentin (NEURONTIN) capsule 300 mg, 300 mg, Oral, TID, Patsey Berthold, NP, 300 mg at 11/18/15 0832 .  insulin aspart (novoLOG) injection 0-15 Units, 0-15 Units, Subcutaneous, TID WC, Patsey Berthold, NP, 2 Units at 11/18/15 1128 .  insulin glargine (LANTUS) injection 20 Units, 20 Units, Subcutaneous, QHS, Renee Dyane Dustman, PA-C, 20 Units at 11/17/15 2137 .  irbesartan  (AVAPRO) tablet 75 mg, 75 mg, Oral, Daily, Deboraha Sprang, MD, 75 mg at 11/18/15 9381 .  metoprolol tartrate (LOPRESSOR) tablet 25 mg, 25 mg, Oral, BID, Skeet Latch, MD, 25 mg at 11/18/15 0831 .  ondansetron (ZOFRAN) injection 4 mg, 4 mg, Intravenous, Q6H PRN, Patsey Berthold, NP .  ondansetron (ZOFRAN) injection 4 mg, 4 mg, Intravenous, Q6H PRN, Deboraha Sprang, MD .  sodium chloride flush (NS) 0.9 % injection 3 mL, 3 mL, Intravenous, Q12H, Patsey Berthold, NP, 3 mL at 11/18/15 0832 .  sodium chloride flush (NS) 0.9 % injection 3 mL, 3 mL, Intravenous, PRN, Patsey Berthold, NP  Patients Current Diet: Diet heart healthy/carb modified Room service appropriate?: Yes; Fluid consistency:: Thin  Precautions / Restrictions Precautions Precautions: Fall, ICD/Pacemaker Restrictions Weight Bearing Restrictions: No   Has the patient had 2 or more falls or a fall with injury in the past year?Yes; fell once several months ago and scraped his nose  Prior Activity Level Community (5-7x/wk): Pt. reports that he drives and goes out to breakfast most mornings.  Otherwise, his driving is limited and his daughter takes him to his MD appointments  Home Assistive Devices / [place] Devices/Equipment: None Home Equipment: Cane - quad, Hand held shower head, Grab bars - tub/shower, Shower seat  Prior Device Use: Indicate devices/aids used by the patient prior to current illness, exacerbation or injury? quad cane  Prior Functional Level Prior Function Level of Independence: Independent with assistive device(s) Comments: used cane, drove himself, daughter took care of home management  Self Care: Did the patient need help bathing, dressing, using the toilet or eating?  Independent  Indoor Mobility: Did the patient need assistance with walking from room to room (with or without device)? Independent  Stairs: Did the patient need assistance with internal or external stairs (with or without  device)? Independent  Functional Cognition: Did the patient need help planning regular tasks such as shopping or remembering to take medications? Independent  Current Functional Level Cognition  Overall Cognitive Status: Within Functional Limits for tasks assessed Orientation Level: Oriented to person, Oriented to place, Oriented to situation    Extremity Assessment (  includes Sensation/Coordination)  Upper Extremity Assessment: LUE deficits/detail LUE Deficits / Details: NT due to Pacemaker precautions, moves independently, however with cues needed for precautions and assist to adjust sling  Lower Extremity Assessment: RLE deficits/detail, LLE deficits/detail RLE Deficits / Details: stiffness in ankles, pt reports intact sensation, but question proprioceptive changes with h/o DM LLE Deficits / Details: stiffness in ankles, pt reports intact sensation, but question proprioceptive changes with h/o DM    ADLs       Mobility  Overal bed mobility: Needs Assistance Bed Mobility: Supine to Sit Supine to sit: Min assist General bed mobility comments: increased time, especially with scooting to EOB and mod cues for precautions    Transfers  Overall transfer level: Needs assistance Equipment used: Quad cane Transfers: Sit to/from Stand Sit to Stand: Mod assist General transfer comment: lifting assist from EOB, pulls up on cane despite cues    Ambulation / Gait / Stairs / Wheelchair Mobility  Ambulation/Gait Ambulation/Gait assistance: Mod assist Ambulation Distance (Feet): 3 Feet Assistive device: Quad cane Gait Pattern/deviations: Decreased stride length, Leaning posteriorly, Wide base of support General Gait Details: heavy support posteriorly due to imbalance with pt walking to sink, then pivotal steps with cane to the chair, again with heavy support to prevent posterior LOB    Posture / Balance Balance Overall balance assessment: Needs assistance Sitting-balance support: Feet  unsupported, Feet supported Sitting balance-Leahy Scale: Fair Postural control: Posterior lean Standing balance support: Single extremity supported Standing balance-Leahy Scale: Poor Standing balance comment: standing at sink for ant weight shifts with heel raises x 5 with R UE support on sink, improved to allow close S to minguard A standing without UE support, but posterior once again when backing up and shifting to recliner    Special needs/care consideration BiPAP/CPAP   no CPM   no Continuous Drip IV   no Dialysis   no        Life Vest   no Oxygen   no Special Bed   no Trach Size   no Wound Vac (area)   no       Skin   WDL per nursing assessment                               Bowel mgmt:   Last BM 11/13/15, continent Bladder mgmt: using urinal, continent Diabetic mgmt yes, on insulin at home per pt.     Previous Home Environment Living Arrangements: Children Available Help at Discharge: Family, Available 24 hours/day Type of Home: House Home Layout: One level Home Access: Stairs to enter Entrance Stairs-Rails: None Entrance Stairs-Number of Steps: 3 Bathroom Shower/Tub: Gaffer Home Care Services: No  Discharge Living Setting Plans for Discharge Living Setting: Patient's home Type of Home at Discharge: House Discharge Home Layout: One level Discharge Home Access: Stairs to enter Entrance Stairs-Rails: None Entrance Stairs-Number of Steps: 3 Discharge Bathroom Shower/Tub: Walk-in shower Discharge Bathroom Toilet: Standard Discharge Bathroom Accessibility: Yes How Accessible: Accessible via walker Does the patient have any problems obtaining your medications?: No  Social/Family/Support Systems Patient Roles: Parent Anticipated Caregiver: Daughter, [name], [phone] Ability/Limitations of Caregiver: Per pt, no restrictions Caregiver Availability: 24/7 Discharge Plan Discussed with Primary Caregiver: No Does Caregiver/Family have Issues with  Lodging/Transportation while Pt is in Rehab?: No   Goals/Additional Needs Patient/Family Goal for Rehab: modified independent and supervision PT; modified independent OT; n/a SLP Expected length of stay: 14-18 days Cultural  Considerations: none Dietary Needs: heart healthy, carb modified, thin liquids Equipment Needs: TBA Pt/Family Agrees to Admission and willing to participate: Yes Program Orientation Provided & Reviewed with Pt/Caregiver Including Roles  & Responsibilities: Yes Additional Information Needs: I attempted to reach pt's daughter by phone on several occasions to update her on the plan for CIR.  She did not answer and her voicemail box was not set up.   Decrease burden of Care through IP rehab admission: n/a   Possible need for SNF placement upon discharge:   Not anticipated   Patient Condition: This patient's medical and functional status has changed since the consult dated 11/17/15 in which the Rehabilitation Physician determined and documented that the patient was potentially appropriate for intensive rehabilitative care in an inpatient rehabilitation facility. Issues have been addressed and update has been discussed with Dr. Posey Pronto and patient now appropriate for inpatient rehabilitation. Will admit to inpatient rehab today.   Preadmission Screen Completed By:  Gerlean Ren, 11/18/2015 1:23 PM ______________________________________________________________________   Discussed status with Dr.  Posey Pronto on 11/18/15 at  1322  and received telephone approval for admission today.  Admission Coordinator:  Gerlean Ren, time 1322 Sudie Grumbling 11/18/15

## 2015-11-18 NOTE — Progress Notes (Signed)
phy assist states pt ready for disch. Have spoken w Edmonia Lynch logue w cir and she is going to find out if they will have bed for pt today. sw has sec plan for snf if cir unavailable.

## 2015-11-19 ENCOUNTER — Inpatient Hospital Stay (HOSPITAL_COMMUNITY): Payer: Medicare Other | Admitting: Occupational Therapy

## 2015-11-19 ENCOUNTER — Inpatient Hospital Stay (HOSPITAL_COMMUNITY): Payer: Medicare Other | Admitting: Physical Therapy

## 2015-11-19 LAB — COMPREHENSIVE METABOLIC PANEL
ALBUMIN: 2.9 g/dL — AB (ref 3.5–5.0)
ALK PHOS: 95 U/L (ref 38–126)
ALT: 17 U/L (ref 17–63)
ANION GAP: 9 (ref 5–15)
AST: 21 U/L (ref 15–41)
BILIRUBIN TOTAL: 0.9 mg/dL (ref 0.3–1.2)
BUN: 13 mg/dL (ref 6–20)
CALCIUM: 9 mg/dL (ref 8.9–10.3)
CO2: 25 mmol/L (ref 22–32)
Chloride: 104 mmol/L (ref 101–111)
Creatinine, Ser: 0.91 mg/dL (ref 0.61–1.24)
GFR calc Af Amer: >60 mL/min (ref >60)
GFR calc non Af Amer: >60 mL/min (ref >60)
GLUCOSE: 83 mg/dL (ref 65–99)
Potassium: 4.4 mmol/L (ref 3.5–5.1)
SODIUM: 138 mmol/L (ref 135–145)
Total Protein: 6 g/dL — ABNORMAL LOW (ref 6.5–8.1)

## 2015-11-19 LAB — CBC WITH DIFFERENTIAL/PLATELET
BASOS ABS: 0 10*3/uL (ref 0.0–0.1)
BASOS PCT: 0 %
EOS ABS: 0.1 10*3/uL (ref 0.0–0.7)
Eosinophils Relative: 1 %
HEMATOCRIT: 36.6 % — AB (ref 39.0–52.0)
HEMOGLOBIN: 12.4 g/dL — AB (ref 13.0–17.0)
Lymphocytes Relative: 16 %
Lymphs Abs: 1.2 10*3/uL (ref 0.7–4.0)
MCH: 31.7 pg (ref 26.0–34.0)
MCHC: 33.9 g/dL (ref 30.0–36.0)
MCV: 93.6 fL (ref 78.0–100.0)
MONOS PCT: 7 %
Monocytes Absolute: 0.5 10*3/uL (ref 0.1–1.0)
NEUTROS ABS: 5.4 10*3/uL (ref 1.7–7.7)
NEUTROS PCT: 76 %
Platelets: 172 10*3/uL (ref 150–400)
RBC: 3.91 MIL/uL — AB (ref 4.22–5.81)
RDW: 14.5 % (ref 11.5–15.5)
WBC: 7.2 10*3/uL (ref 4.0–10.5)

## 2015-11-19 LAB — GLUCOSE, CAPILLARY
GLUCOSE-CAPILLARY: 162 mg/dL — AB (ref 65–99)
GLUCOSE-CAPILLARY: 163 mg/dL — AB (ref 65–99)
Glucose-Capillary: 72 mg/dL (ref 65–99)
Glucose-Capillary: 171 mg/dL — ABNORMAL HIGH (ref 65–99)

## 2015-11-19 NOTE — Evaluation (Addendum)
Physical Therapy Assessment and Plan  Patient Details  Name: [name] MRN: [id number] Date of Birth: 1930/06/05  PT Diagnosis: Difficulty walking and Muscle weakness Rehab Potential: Excellent ELOS: 5-7 days    Today's Date: 11/19/2015 PT Individual Time: 3614-4315  PT Individual Time Calculation (min): 61 min    Problem List:  Patient Active Problem List   Diagnosis Date Noted  . Debilitated 11/18/2015  . Neuropathic pain   . Acute on chronic combined systolic and diastolic heart failure ([place])   . Tachycardia   . Pacemaker   . DM type 2 with diabetic peripheral neuropathy ([place])   . Benign essential HTN   . History of syncope   . LBBB (left bundle branch block)   . Tachypnea   . Acute blood loss anemia   . NSVT (nonsustained ventricular tachycardia) ([place])   . Bradycardia, severe sinus 11/13/2015  . Complete heart block ([place])   . Chronic combined systolic and diastolic heart failure ([place]) 04/27/2013  . Essential hypertension 04/27/2013  . Left bundle branch block 04/27/2013  . Type II or unspecified type diabetes mellitus with neurological manifestations, uncontrolled 01/15/2013    Past Medical History:  Past Medical History  Diagnosis Date  . Diabetes mellitus   . ED (erectile dysfunction)   . Hypertension   . Hyperlipidemia   . Pneumonia     hx of  . CHF (congestive heart failure) ([place])   . Urination frequency   . Cancer ([place])     hx of prostate ca  . Neuromuscular disorder (H[facility])     numbness in hand/cervical issues  . Cataracts, bilateral     hx of  . Syncope     hx of   Past Surgical History:  Past Surgical History  Procedure Laterality Date  . Hernia repair    . Tonsillectomy    . Eye surgery      cataract surgery bilateral  . Small intestine surgery      hx of  . Prostate surgery      s/p ca  . Posterior cervical fusion/foraminotomy  11/18/2011    Procedure: POSTERIOR CERVICAL FUSION/FORAMINOTOMY LEVEL 1;  Surgeon: Eustace Moore, MD;   Location: [facility];  Service: Neurosurgery;  Laterality: Bilateral;  . Cardiac catheterization N/A 11/13/2015    Procedure: Temporary Pacemaker;  Surgeon: Lorretta Harp, MD;  Location: [facility];  Service: Cardiovascular;  Laterality: N/A;  . Ep implantable device N/A 11/14/2015    Procedure: BiV Pacemaker Insertion CRT-P;  Surgeon: Deboraha Sprang, MD;  Location: [facility];  Service: Cardiovascular;  Laterality: N/A;    Assessment & Plan Clinical Impression: Patient is a 82 y.o. right handed male with history of diabetes mellitus with neuropathy, hypertension, remote syncope, chronic left bundle branch block, chronic combined systolic diastolic congestive heart failure. Patient lives with daughter and assistance is needed. Independent with assistive device using quad cane prior to admission and driving short distances. One level home with 3 steps to entry. Presented 11/13/2015 with dizziness/question syncope. Troponin negative. He was found to be in complete heart block. Follow-up with cardiology services. Echocardiogram ejection fraction of 45%. Systolic function was mildly to moderately reduced. Underwent placement of permanent pacemaker 11/14/2015.  Patient transferred to CIR on 11/18/2015 .   Patient currently requires min with mobility secondary to muscle weakness, decreased cardiorespiratoy endurance and decreased standing balance and decreased balance strategies.  Prior to hospitalization, patient was modified independent  with mobility and lived  with Daughter in a House home.  Home access is 3Stairs to enter.  Patient will benefit from skilled PT intervention to maximize safe functional mobility, minimize fall risk and decrease caregiver burden for planned discharge home with intermittent assist.  Anticipate patient will benefit from follow up [facility] at discharge.  PT - End of Session Activity Tolerance: Tolerates 30+ min activity with multiple rests Endurance Deficit: Yes PT  Assessment Rehab Potential (ACUTE/IP ONLY): Excellent PT Patient demonstrates impairments in the following area(s): Balance;Endurance;Motor PT Transfers Functional Problem(s): Bed Mobility;Bed to Chair;Car;Furniture;Floor PT Locomotion Functional Problem(s): Ambulation;Wheelchair Mobility;Stairs PT Plan PT Intensity: Minimum of 1-2 x/day ,45 to 90 minutes PT Frequency: 5 out of 7 days PT Duration Estimated Length of Stay: 5-7 days  PT Treatment/Interventions: Ambulation/gait training;Balance/vestibular training;Discharge planning;Disease management/prevention;DME/adaptive equipment instruction;Functional mobility training;Neuromuscular re-education;Pain management;Patient/family education;Psychosocial support;Skin care/wound management;Stair training;Therapeutic Activities;Therapeutic Exercise;UE/LE Strength taining/ROM;UE/LE Coordination activities;Visual/perceptual remediation/compensation;Wheelchair propulsion/positioning PT Transfers Anticipated Outcome(s): Mod I with LRAD PT Locomotion Anticipated Outcome(s): Mod I -supervision with LRAD.  PT Recommendation Follow Up Recommendations: Home health PT Patient destination: Home Equipment Recommended: Rolling walker with 5" wheels  Skilled Therapeutic Intervention Patient received sitting in recliner and agreeable to PT.  PT performed evaluation and initiated treatment intervention. See below for results.  Pt instructed by PT in Car transfer with min A without A and supervision A with RW. Gait training with RW as listed below. Gait with Quad cane x 20f with min A from PT due to decreased balance and to improve safety. PT instructed patient on improved gait pattern with wider BOS, with little change in gait.  Patient also instructed in picking up object from floor with min A from PT and not AD.   Patient left sitting in recliner with call bell in reach.   PT Evaluation Precautions/Restrictions Precautions Precautions:  Fall;ICD/Pacemaker Required Braces or Orthoses: Sling Restrictions Weight Bearing Restrictions: No General Chart Reviewed: Yes Additional Pertinent History: CHF Family/Caregiver Present: No Vital Signs  Pain Pain Assessment Pain Assessment: No/denies pain Pain Score: 0-No pain Home Living/Prior Functioning Home Living Available Help at Discharge: Family;Available 24 hours/day Type of Home: House Home Access: Stairs to enter [employer] Steps: 3 Entrance Stairs-Rails: Can reach both;Right;Left Home Layout: One level Bathroom Shower/Tub: WMultimedia programmer Standard Bathroom Accessibility: Yes  Lives With: Daughter Prior Function Level of Independence: Requires assistive device for independence  Able to Take Stairs?: Yes Driving: Yes Vocation: Retired Comments: used cane, drove himself, daughter took care of home management Vision/Perception     Cognition Orientation Level: Oriented X4 Memory: Appears intact Awareness: Appears intact Problem Solving: Appears intact Safety/Judgment: Appears intact Sensation Sensation Light Touch: Appears Intact Hot/Cold: Appears Intact Proprioception: Appears Intact Coordination Gross Motor Movements are Fluid and Coordinated: Yes Fine Motor Movements are Fluid and Coordinated: Yes Motor    generalized weakness.  Mobility Bed Mobility Bed Mobility: Rolling Right;Rolling Left;Supine to Sit;Sit to Supine Rolling Right: 5: Supervision Rolling Right Details: Verbal cues for technique Rolling Left: 5: Supervision Rolling Left Details: Verbal cues for technique Supine to Sit: 5: Supervision Supine to Sit Details: Verbal cues for technique Sit to Supine: 5: Supervision Sit to Supine - Details: Verbal cues for technique Transfers Transfers: Yes Sit to Stand: 4: Min guard Sit to Stand Details: Verbal cues for technique;Verbal cues for safe use of DME/AE Stand to Sit: 4: Min guard Stand to Sit Details  (indicate cue type and reason): Verbal cues for technique;Verbal cues for safe use of DME/AE Stand Pivot Transfers:  4: Min guard Locomotion  Ambulation Ambulation: Yes Ambulation/Gait Assistance: 5: Supervision Ambulation Distance (Feet): 180 Feet Assistive device: Rolling walker Ambulation/Gait Assistance Details: Verbal cues for gait pattern;Verbal cues for safe use of DME/AE Gait Gait: Yes Gait Pattern: Impaired Gait Pattern: Decreased step length - right;Decreased step length - left Stairs / Additional Locomotion Stairs: Yes Stairs Assistance: 5: Supervision Stair Management Technique: Two rails Number of Stairs: 4 Height of Stairs: 6 Wheelchair Mobility Wheelchair Mobility: No  Trunk/Postural Assessment  Cervical Assessment Cervical Assessment: Within Functional Limits Thoracic Assessment Thoracic Assessment: Within Functional Limits Lumbar Assessment Lumbar Assessment: Exceptions to [facility] (Mild posterior pelvic tilt) Postural Control Postural Control: Within Functional Limits  Balance Balance Balance Assessed: Yes Static Sitting Balance Static Sitting - Level of Assistance: 6: Modified independent (Device/Increase time) Dynamic Sitting Balance Dynamic Sitting - Level of Assistance: 6: Modified independent (Device/Increase time) Static Standing Balance Static Standing - Level of Assistance: 5: Stand by assistance Dynamic Standing Balance Dynamic Standing - Level of Assistance: 5: Stand by assistance Extremity Assessment      RLE Assessment RLE Assessment: Exceptions to [facility] (grossly 4/5 to 4+/5 throughout extremity) LLE Assessment LLE Assessment: Exceptions to [facility] (Grossly 4/5 to 4+/5 through Extremity)   See Function Navigator for Current Functional Status.   Refer to Care Plan for Long Term Goals  Recommendations for other services: None  Discharge Criteria: Patient will be discharged from PT if patient refuses treatment 3 consecutive times without medical  reason, if treatment goals not met, if there is a change in medical status, if patient makes no progress towards goals or if patient is discharged from hospital.  The above assessment, treatment plan, treatment alternatives and goals were discussed and mutually agreed upon: by patient  Lorie Phenix 11/19/2015, 11:16 AM

## 2015-11-19 NOTE — Progress Notes (Signed)
Physical Therapy Session Note  Patient Details  Name: [name] MRN: [id number] Date of Birth: [birth year]  Today's Date: 11/19/2015 PT Individual Time: 1304-1402 PT Individual Time Calculation (min): 58 min   Short Term Goals: Week 1:  PT Short Term Goal 1 (Week 1): STG =LTG due to ELOS  Skilled Therapeutic Interventions/Progress Updates:  Pt received in recliner & agreeable to PT, denying c/o pain. At rest, pt's HR = 71 bpm and SpO2 = 100% on room air. PT observed pt's pants to be wet & pt reported he spilled a soda on him during lunch but did not want to change clothes. PT encouraged pt to change clothes & provided him with paper scrub pants. Pt able to doff old & don new pants with Min A. Pt able to transfer to standing to complete dressing tasks. Pt able to doff & don shoes independently. Gait training x 180 ft room>gym with RW & supervision A; pt demonstrates decreased gait speed. In gym pt observed to be incontinent of urine & returned to room. PT provided mod/min A to assist pt with donning new pants, total A for brief. Gait training an additional 200 ft with RW & Supervision, providing cues for increasing gait speed with pt able to return demonstrate. Throughout session pt required cuing to square up to chair & for proper hand placement for sit<>stand transfers. At end of session reviewed use of call bell with pt; pt left in recliner with all needs within reach.   Therapy Documentation Precautions:  Precautions Precautions: Fall, ICD/Pacemaker Required Braces or Orthoses: Sling Restrictions Weight Bearing Restrictions: No  Pain: Pain Assessment Pain Assessment: No/denies pain  See Function Navigator for Current Functional Status.   Therapy/Group: Individual Therapy  Waunita Schooner 11/19/2015, 5:16 PM

## 2015-11-19 NOTE — Progress Notes (Signed)
[address] [facility] PHYSICAL MEDICINE & REHABILITATION     PROGRESS NOTE  Subjective/Complaints:  Pt laying in bed.  He had a good first night and is ready to being his day of therapies.   ROS: Denies CP, SOB, N/V/D.  Objective: Vital Signs: Blood pressure 128/68, pulse 75, temperature 97.9 F (36.6 C), temperature source Oral, resp. rate 18, height 5\' 9"  (1.753 m), weight 85.4 kg (188 lb 4.4 oz), SpO2 98 %. No results found.  Recent Labs  11/17/15 2020 11/19/15 0714  WBC 7.0 7.2  HGB 13.1 12.4*  HCT 40.1 36.6*  PLT 163 172    Recent Labs  11/17/15 2020 11/19/15 0714  NA 138 138  K 4.5 4.4  CL 104 104  GLUCOSE 96 83  BUN 13 13  CREATININE 1.02 0.91  CALCIUM 8.8* 9.0   CBG (last 3)   Recent Labs  11/18/15 1617 11/18/15 2046 11/19/15 0637  GLUCAP 132* 109* 72    Wt Readings from Last 3 Encounters:  11/19/15 85.4 kg (188 lb 4.4 oz)  11/13/15 85.5 kg (188 lb 7.9 oz)  10/21/15 87.091 kg (192 lb)    Physical Exam:  BP 128/68 mmHg  Pulse 75  Temp(Src) 97.9 F (36.6 C) (Oral)  Resp 18  Ht 5\' 9"  (1.753 m)  Wt 85.4 kg (188 lb 4.4 oz)  BMI 27.79 kg/m2  SpO2 98% Constitutional: He is oriented to person, place, and time. He appears well-developed and well-nourished.  HENT:  Head: Normocephalic and atraumatic.  Eyes: Conjunctivae and EOM are normal.  Neck: Normal range of motion. Neck supple. No thyromegaly present.  Cardiovascular:  Cardiac rate control  Respiratory: Effort normal and breath sounds normal. No respiratory distress.  GI: Soft. Bowel sounds are normal. He exhibits no distension.  Musculoskeletal: He exhibits no edema or tenderness.  Neurological: He is alert and oriented to person, place, and time.  Motor: 4+/5 throughout Skin: Skin is warm and dry.  Pacemaker site c/d/i  Psychiatric: He has a normal mood and affect. His behavior is normal. Thought content normal  Assessment/Plan: 1. Functional deficits secondary to complete heart block  status post pacemaker/multi-medical which require 3+ hours per day of interdisciplinary therapy in a comprehensive inpatient rehab setting. Physiatrist is providing close team supervision and 24 hour management of active medical problems listed below. Physiatrist and rehab team continue to assess barriers to discharge/monitor patient progress toward functional and medical goals.  Function:  Bathing Bathing position      Bathing parts      Bathing assist        Upper Body Dressing/Undressing Upper body dressing                    Upper body assist        Lower Body Dressing/Undressing Lower body dressing                                  Lower body assist        Toileting Toileting          Toileting assist     Transfers Chair/bed transfer             Locomotion Ambulation           Wheelchair          Cognition Comprehension Comprehension assist level: Understands basic 90% of the time/cues < 10% of the time  Expression Expression  assist level: Expresses basic 90% of the time/requires cueing < 10% of the time.  Social Interaction Social Interaction assist level: Interacts appropriately with others - No medications needed.  Problem Solving Problem solving assist level: Solves basic 90% of the time/requires cueing < 10% of the time  Memory Memory assist level: Recognizes or recalls 90% of the time/requires cueing < 10% of the time     Medical Problem List and Plan: 1. Debilitation secondary to complete heart block status post pacemaker/multi-medical  Begin CIR 2. DVT Prophylaxis/Anticoagulation: SCDs. Monitor for any signs of DVT 3. Pain Management: Neurontin 300 mg 3 times a day 4. Hypertension. Avapro 75 mg daily, Lopressor 25 mg twice a day.   Monitor with increased mobility 5. Neuropsych: This patient is capable of making decisions on his own behalf. 6. Skin/Wound Care: Routine skin checks 7. Fluids/Electrolytes/Nutrition:  Routine I&O's   BMP within acceptable range on 7/12 8. Chronic combined systolic and diastolic heart failure. Lasix 20 mg daily. Monitor for any signs of fluid overload Filed Weights   11/18/15 1607 11/19/15 0500  Weight: 85.503 kg (188 lb 8 oz) 85.4 kg (188 lb 4.4 oz)   9. Diabetes mellitus with peripheral neuropathy. Hemoglobin A1c 7.3. Lantus insulin 20 units daily at bedtime. Check blood sugars before meals and at bedtime. Diabetic teaching 10. LBBB: Monitor 11. ABLA:   Hb 12.4 on 7/12  Cont to monitor 12. Tachycardia:   Improved  Likely due to deconditioning. Cont to monitor.  13. Tachpnea:   Improved  Likely due to deconditioning. Cont to monitor.   LOS (Days) 1 A FACE TO FACE EVALUATION WAS PERFORMED  Ankit Lorie Phenix 11/19/2015 8:51 AM

## 2015-11-19 NOTE — Care Management Note (Signed)
[facility] Individual Statement of Services  Patient Name:  [name]  Date:  11/19/2015  [facility].  Our goal is to provide you with an individualized program based on your diagnosis and situation, designed to meet your specific needs.  With this comprehensive rehabilitation program, you will be expected to participate in at least 3 hours of rehabilitation therapies Monday-Friday, with modified therapy programming on the weekends.  Your rehabilitation program will include the following services:  Physical Therapy (PT), Occupational Therapy (OT), 24 hour per day rehabilitation nursing, Therapeutic Recreaction (TR), Case Management (Social Worker), Rehabilitation Medicine, Nutrition Services and Pharmacy Services  Weekly team conferences will be held on Wednesday to discuss your progress.  Your Social Worker will talk with you frequently to get your input and to update you on team discussions.  Team conferences with you and your family in attendance may also be held.  Expected length of stay: 5-7 days  Overall anticipated outcome: mod/i-ambulation & supervision set up-ADL's  Depending on your progress and recovery, your program may change. Your Social Worker will coordinate services and will keep you informed of any changes. Your Social Worker's name and contact numbers are listed  below.  The following services may also be recommended but are not provided by the [facility] will be made to provide these services after discharge if needed.  Arrangements include referral to agencies that provide these services.  Your insurance has been verified to be:  Medicare Your primary doctor is:  Elayne Snare  Pertinent information will be shared with your doctor and your insurance company.  Social Worker:   Ovidio Kin, [place] or ([phone]  Information discussed with and copy given to patient by: Elease Hashimoto, 11/19/2015, 2:02 PM

## 2015-11-19 NOTE — Progress Notes (Addendum)
Occupational Therapy Assessment and Plan  Patient Details  Name: [name] MRN: [id number] Date of Birth: 02/20/1931  OT Diagnosis: acute pain and muscle weakness (generalized) Rehab Potential: Rehab Potential (ACUTE ONLY): Excellent ELOS:     Today's Date: 11/19/2015 OT Individual Time:  -  6433-2951 (78 minutes)        Problem List:  Patient Active Problem List   Diagnosis Date Noted  . Debilitated 11/18/2015  . Neuropathic pain   . Acute on chronic combined systolic and diastolic heart failure ([place])   . Tachycardia   . Pacemaker   . DM type 2 with diabetic peripheral neuropathy ([place])   . Benign essential HTN   . History of syncope   . LBBB (left bundle branch block)   . Tachypnea   . Acute blood loss anemia   . NSVT (nonsustained ventricular tachycardia) ([place])   . Bradycardia, severe sinus 11/13/2015  . Complete heart block ([place])   . Chronic combined systolic and diastolic heart failure ([place]) 04/27/2013  . Essential hypertension 04/27/2013  . Left bundle branch block 04/27/2013  . Type II or unspecified type diabetes mellitus with neurological manifestations, uncontrolled 01/15/2013    Past Medical History:  Past Medical History  Diagnosis Date  . Diabetes mellitus   . ED (erectile dysfunction)   . Hypertension   . Hyperlipidemia   . Pneumonia     hx of  . CHF (congestive heart failure) ([place])   . Urination frequency   . Cancer ([place])     hx of prostate ca  . Neuromuscular disorder (HCC)     numbness in hand/cervical issues  . Cataracts, bilateral     hx of  . Syncope     hx of   Past Surgical History:  Past Surgical History  Procedure Laterality Date  . Hernia repair    . Tonsillectomy    . Eye surgery      cataract surgery bilateral  . Small intestine surgery      hx of  . Prostate surgery      s/p ca  . Posterior cervical fusion/foraminotomy  11/18/2011    Procedure: POSTERIOR CERVICAL FUSION/FORAMINOTOMY LEVEL 1;  Surgeon: Eustace Moore, MD;   Location: [facility];  Service: Neurosurgery;  Laterality: Bilateral;  . Cardiac catheterization N/A 11/13/2015    Procedure: Temporary Pacemaker;  Surgeon: Lorretta Harp, MD;  Location: [facility];  Service: Cardiovascular;  Laterality: N/A;  . Ep implantable device N/A 11/14/2015    Procedure: BiV Pacemaker Insertion CRT-P;  Surgeon: Deboraha Sprang, MD;  Location: [facility];  Service: Cardiovascular;  Laterality: N/A;    Assessment & Plan Clinical Impression: [name] is a 82 y.o. right handed male with history of diabetes mellitus with neuropathy, hypertension, remote syncope, chronic left bundle branch block, chronic combined systolic diastolic congestive heart failure. Patient lives with daughter and assistance is needed. Independent with assistive device using quad cane prior to admission and driving short distances. One level home with 3 steps to entry. Presented 11/13/2015 with dizziness/question syncope. Troponin negative. He was found to be in complete heart block. Follow-up with cardiology services. Echocardiogram ejection fraction of 45%. Systolic function was mildly to moderately reduced. Underwent placement of permanent pacemaker 11/14/2015. Physical therapy evaluation completed 11/17/2015 with recommendations of physical medicine rehabilitation consult.    Patient currently requires Min A-Supervision with basic self-care skills secondary to muscle weakness, decreased cardiorespiratoy endurance, decreased safety awareness and decreased standing  balance.  Prior to hospitalization, patient could complete BADLs with Mod I-Complete Independence.   Patient will benefit from skilled intervention to decrease level of assist with basic self-care skills prior to discharge home with care partner.  Anticipate patient will require intermittent supervision and follow up home health.  OT - End of Session Activity Tolerance: Tolerates 30+ min activity with multiple  rests Endurance Deficit: Yes OT Assessment Rehab Potential (ACUTE ONLY): Excellent OT Patient demonstrates impairments in the following area(s): Balance;Cognition;Endurance;Safety OT Basic ADL's Functional Problem(s): Bathing;Dressing;Toileting OT Transfers Functional Problem(s): Toilet;Tub/Shower OT Plan OT Intensity: Minimum of 1-2 x/day, 45 to 90 minutes OT Frequency: 5 out of 7 days OT Treatment/Interventions: Balance/vestibular training;Cognitive remediation/compensation;DME/adaptive equipment instruction;Community reintegration;Discharge planning;Neuromuscular re-education;Functional mobility training;Patient/family education;Therapeutic Activities;Therapeutic Exercise;Psychosocial support;Self Care/advanced ADL retraining;UE/LE Strength taining/ROM;UE/LE Coordination activities;Pain management;Splinting/orthotics OT Self Feeding Anticipated Outcome(s): Mod I with self feeding  OT Basic Self-Care Anticipated Outcome(s): Supervision-Mod I with BADLs OT Toileting Anticipated Outcome(s): Mod I with toileting  OT Bathroom Transfers Anticipated Outcome(s): Supervision for bathroom transfers  OT Recommendation Patient destination: Home Follow Up Recommendations: Home health OT Equipment Recommended: To be determined ELOS: 6-8 days  Skilled Therapeutic Intervention Pt was seen for skilled OT evaluation involving assessment of BADL abilities. Orthostatics taken due to pt cardiac diagnoses: 121/71 in supine; 132/74 at EOB and 128/68 post short walk in bathroom with RW later in session. Pt asymptomatic of dizziness/nausea during session with no c/o pain. Pt transferred from bed to chair with RW and Min A. Pt exhibits posterior lean and requires manual and vcs to correct. In chair at sink, pt completed UB/LB bathing with Min A for back. Pt completed UB/LB dressing with Min A for steadying balance while pulling pants over hips. Oral care and shaving was completed w/c level with supervision for safety  with razor. Pt ambulated to bathroom with RW and Min A to complete toilet transfer. Toileting tasks completed with Min A for balance support and safely negotiating walker. Pt requires max vcs for safe walker placement during functional transfers and safe hand placement during sit to stand. Pt was provided education on OT POC and CIR schedule. Pt transferred to bedside recliner with all needs within reach.    OT Evaluation Precautions/Restrictions  Precautions Precautions: Fall;ICD/Pacemaker Required Braces or Orthoses: Sling Restrictions Weight Bearing Restrictions: No General Chart Reviewed: Yes Family/Caregiver Present: No Vital Signs   Pain Pain Assessment Pain Assessment: No/denies pain Pain Score: 0-No pain Home Living/Prior Functioning Home Living Family/patient expects to be discharged to:: Private residence Living Arrangements: Children Available Help at Discharge: Available 24 hours/day, Family Type of Home: House Home Access: Stairs to enter Technical brewer of Steps: 3 Entrance Stairs-Rails: Right, Left, Can reach both Home Layout: One level Bathroom Shower/Tub: Multimedia programmer: Standard Bathroom Accessibility: Yes  Lives With: Daughter (74 y/o, able bodied and able to provide 24/7 supervision (unemployed)) IADL History Homemaking Responsibilities: No Current License: Yes Mode of Transportation: Musician Occupation: Retired 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  Leisure and Hobbies: Going out to breakfast in the mornings  Prior Function Level of Independence: Independent with basic ADLs  Able to Take Stairs?: Yes Driving: Yes Vocation: Retired Leisure: Hobbies-no Comments: used cane, drove himself, daughter took care of home management ADL  Please see functional navigator   Vision/Perception  Vision- History Baseline Vision/History: No visual deficits Patient Visual Report: No change from baseline Vision-  Assessment Vision Assessment?: Yes Eye Alignment: Within Functional Limits Tracking/Visual Pursuits: Able to track  stimulus in all quads without difficulty Saccades: Within functional limits Convergence: Within functional limits Visual Fields: No apparent deficits  Cognition Overall Cognitive Status: Within Functional Limits for tasks assessed Arousal/Alertness: Awake/alert Orientation Level: Person;Place;Situation Person: Oriented Place: Oriented Situation: Oriented Year: 2017 Month: July Day of Week: Incorrect Memory: Appears intact Immediate Memory Recall: Sock;Blue;Bed Memory Recall: Sock;Blue;Bed Memory Recall Sock: Without Cue Memory Recall Blue: Without Cue Memory Recall Bed: Without Cue Attention: Sustained Sustained Attention: Appears intact Awareness: Appears intact Problem Solving: Appears intact Safety/Judgment: Appears intact Sensation Sensation Light Touch: Appears Intact Hot/Cold: Appears Intact Proprioception: Appears Intact Coordination Gross Motor Movements are Fluid and Coordinated: Yes Fine Motor Movements are Fluid and Coordinated: Yes Motor  Motor Motor: Within Functional Limits Motor - Skilled Clinical Observations: during basic self care tasks Mobility  Bed Mobility Bed Mobility: Supine to Sit Rolling Right: 5: Supervision Rolling Right Details: Verbal cues for technique Rolling Left: 5: Supervision Rolling Left Details: Verbal cues for technique Supine to Sit: 5: Supervision Supine to Sit Details: Verbal cues for technique Sit to Supine: 5: Supervision Sit to Supine - Details: Verbal cues for technique Transfers Sit to Stand: 4: Min guard Sit to Stand Details: Verbal cues for technique;Verbal cues for precautions/safety;Verbal cues for sequencing Stand to Sit: 4: Min guard Stand to Sit Details (indicate cue type and reason): Verbal cues for precautions/safety;Manual facilitation for weight shifting;Verbal cues for technique   Trunk/Postural Assessment  Cervical Assessment Cervical Assessment: Within Functional Limits Thoracic Assessment Thoracic Assessment: Within Functional Limits Lumbar Assessment Lumbar Assessment: Exceptions to [facility] Postural Control Postural Control: Within Functional Limits  Balance Balance Balance Assessed: Yes Static Sitting Balance Static Sitting - Level of Assistance: 6: Modified independent (Device/Increase time) Dynamic Sitting Balance Dynamic Sitting - Level of Assistance: 5: Stand by assistance Static Standing Balance Static Standing - Level of Assistance: 5: Stand by assistance Dynamic Standing Balance Dynamic Standing - Level of Assistance: 4: Min assist Extremity/Trunk Assessment RUE Assessment RUE Assessment: Within Functional Limits LUE Assessment LUE Assessment: Within Functional Limits   See Function Navigator for Current Functional Status.   Refer to Care Plan for Long Term Goals  Recommendations for other services: None  Discharge Criteria: Patient will be discharged from OT if patient refuses treatment 3 consecutive times without medical reason, if treatment goals not met, if there is a change in medical status, if patient makes no progress towards goals or if patient is discharged from hospital.  The above assessment, treatment plan, treatment alternatives and goals were discussed and mutually agreed upon: by patient  Skeet Simmer 11/19/2015, 12:54 PM

## 2015-11-19 NOTE — Progress Notes (Signed)
Social Work  Social Work Assessment and Plan  Patient Details  Name: [name] MRN: [id number] Date of Birth: [birth year]  Today's Date: 11/19/2015  Problem List:  Patient Active Problem List   Diagnosis Date Noted  . Debilitated 11/18/2015  . Neuropathic pain   . Acute on chronic combined systolic and diastolic heart failure ([place])   . Tachycardia   . Pacemaker   . DM type 2 with diabetic peripheral neuropathy ([place])   . Benign essential HTN   . History of syncope   . LBBB (left bundle branch block)   . Tachypnea   . Acute blood loss anemia   . NSVT (nonsustained ventricular tachycardia) ([place])   . Bradycardia, severe sinus 11/13/2015  . Complete heart block ([place])   . Chronic combined systolic and diastolic heart failure ([place]) 04/27/2013  . Essential hypertension 04/27/2013  . Left bundle branch block 04/27/2013  . Type II or unspecified type diabetes mellitus with neurological manifestations, uncontrolled 01/15/2013   Past Medical History:  Past Medical History  Diagnosis Date  . Diabetes mellitus   . ED (erectile dysfunction)   . Hypertension   . Hyperlipidemia   . Pneumonia     hx of  . CHF (congestive heart failure) ([place])   . Urination frequency   . Cancer ([place])     hx of prostate ca  . Neuromuscular disorder (HCC)     numbness in hand/cervical issues  . Cataracts, bilateral     hx of  . Syncope     hx of   Past Surgical History:  Past Surgical History  Procedure Laterality Date  . Hernia repair    . Tonsillectomy    . Eye surgery      cataract surgery bilateral  . Small intestine surgery      hx of  . Prostate surgery      s/p ca  . Posterior cervical fusion/foraminotomy  11/18/2011    Procedure: POSTERIOR CERVICAL FUSION/FORAMINOTOMY LEVEL 1;  Surgeon: Eustace Moore, MD;  Location: [facility];  Service: Neurosurgery;  Laterality: Bilateral;  . Cardiac catheterization N/A 11/13/2015    Procedure: Temporary Pacemaker;  Surgeon: Lorretta Harp, MD;   Location: [facility];  Service: Cardiovascular;  Laterality: N/A;  . Ep implantable device N/A 11/14/2015    Procedure: BiV Pacemaker Insertion CRT-P;  Surgeon: Deboraha Sprang, MD;  Location: [facility];  Service: Cardiovascular;  Laterality: N/A;   Social History:  reports that he has never smoked. He has never used smokeless tobacco. He reports that he does not drink alcohol or use illicit drugs.  Family / Support Systems Marital Status: Widow/Widower Patient Roles: Parent, Volunteer Children: Willette Cluster [phone] Other Supports: William-friend [phone] Anticipated Caregiver: Daughter Ability/Limitations of Caregiver: None Caregiver Availability: 24/7 Family Dynamics: Close knit with daughter and friends. He has done well up until this point being in the hospital. He is hopeful he will do well again and regain his strength while he is here, he does not want to burden his daughter, he likes being independent.  Social History Preferred language: English Religion: Baptist Cultural Background: No issues Education: [employer] Read: Yes Write: Yes Employment Status: Retired Freight forwarder Issues: No issues Guardian/Conservator: None-according to MD pt is capable of [employer] his own decisions while here.   Abuse/Neglect Physical Abuse: Denies Verbal Abuse: Denies Sexual Abuse: Denies Exploitation of patient/patient's resources: Denies Self-Neglect: Denies  Emotional Status Pt's affect, behavior adn adjustment status: Pt has  always been independent and taken care of himself and he wants to again. His daughter is supportive and does the home management a and takes him to appointments. He ususally goes out to breakfast every morning with his friends and wants to resume this. Recent Psychosocial Issues: other health issues were managed before this Pyschiatric History: No history deferred depression screen due to pt coping appropriately at this time and will be  here a very short time. Will monitor while here and intervene if needed Substance Abuse History: No issues  Patient / Family Perceptions, Expectations & Goals Pt/Family understanding of illness & functional limitations: Pt and daughter can explain his cardiac issues and pacemaker being placed. Both talk with the MD and feel their questions and concerns are being addressed. He is here mainly to get stronger after surgery. Premorbid pt/family roles/activities: Father, Friend, retiree, church member, etc Anticipated changes in roles/activities/participation: resume Pt/family expectations/goals: Pt states: " I want to take care of myself, I always have."  Daughter states: " I will assist if needed."  [employer]: Other (Comment) (had in past) Premorbid Home Care/DME Agencies: Other (Comment) (had in past) Transportation available at discharge: Daughter and pt drove short distances  Discharge Planning Living Arrangements: Children Support Systems: Children, Water engineer, Social worker community Type of Residence: Private residence Insurance Resources: [employer] Financial Resources: [place] Referred: No Living Expenses: Own Money Management: Patient, Family Does the patient have any problems obtaining your medications?: No Home Management: Daughter does home management Patient/Family Preliminary Plans: Return home with daughter who can provide assistance if needed. Will await team's evaluations and coem up with a safe discharge plan. Pt is high level and seems to be a short length of stay. Social Work Anticipated Follow Up Needs: HH/OP  Clinical Impression Pleasant gentleman who is high level and doing well, main issue is to regain his strength from surgery. His daughter is supportive and involved and will assist him if needed. Will await team's evaluations and come up with a safe discharge plan. Encouraged daughter to attend therapies  with him so can see how he is doing.  Elease Hashimoto 11/19/2015, 1:59 PM

## 2015-11-19 NOTE — IPOC Note (Signed)
Overall Plan of Care [facility]) [name] Details Name: [name] MRN: [id number] DOB: 12-20-1930  Admitting Diagnosis: [facility] Problems: Active Problems:   Debilitated   Neuropathic pain   Acute on chronic combined systolic and diastolic heart failure (HCC)   Tachycardia     Functional Problem List: Nursing Endurance, Medication Management, Pain, Safety, Skin Integrity  PT Balance, Endurance, Motor  OT Balance, Cognition, Endurance, Safety  SLP    TR         Basic ADL's: OT Bathing, Dressing, Toileting     Advanced  ADL's: OT       Transfers: PT Bed Mobility, Bed to Chair, Car, [employer], Floor  OT Toilet, Metallurgist: PT Ambulation, Emergency planning/management officer, Stairs     Additional Impairments: OT    SLP        TR      Anticipated Outcomes Item Anticipated Outcome  Self Feeding Mod I with self feeding   Swallowing      Basic self-care  Supervision-Mod I with BADLs  Toileting  Mod I with toileting    Bathroom Transfers Supervision for bathroom transfers   Bowel/Bladder  Supervision  Transfers  Mod I with LRAD  Locomotion  Mod I -supervision with LRAD.   Communication     Cognition     Pain  <2 on a 0-10 pain scale  Safety/Judgment  Min assist   Therapy Plan: PT Intensity: Minimum of 1-2 x/day ,45 to 90 minutes PT Frequency: 5 out of 7 days PT Duration Estimated Length of Stay: 5-7 days  OT Intensity: Minimum of 1-2 x/day, 45 to 90 minutes OT Frequency: 5 out of 7 days OT Duration/Estimated Length of Stay: 6-8 days         Team Interventions: Nursing Interventions [name]/Family Education, Pain Management, Medication Management, Skin Care/Wound Management, Discharge Planning, Psychosocial Support  PT interventions Ambulation/gait training, Balance/vestibular training, Discharge planning, Disease management/prevention, DME/adaptive equipment instruction, Functional mobility training, Neuromuscular re-education,  Pain management, [name]/family education, Psychosocial support, Skin care/wound management, Stair training, Therapeutic Activities, Therapeutic Exercise, UE/LE Strength taining/ROM, UE/LE Coordination activities, Visual/perceptual remediation/compensation, Wheelchair propulsion/positioning  OT Interventions Training and development officer, Cognitive remediation/compensation, DME/adaptive equipment instruction, Community reintegration, Discharge planning, Neuromuscular re-education, Functional mobility training, [name]/family education, Therapeutic Activities, Therapeutic Exercise, Psychosocial support, Self Care/advanced ADL retraining, UE/LE Strength taining/ROM, UE/LE Coordination activities, Pain management, Splinting/orthotics  SLP Interventions    TR Interventions    SW/CM Interventions Discharge Planning, Psychosocial Support, [name]/Family Education    Team Discharge Planning: Destination: PT-Home ,OT- Home , SLP-  Projected Follow-up: PT-Home health PT, OT-  Home health OT, SLP-  Projected Equipment Needs: PT-Rolling walker with 5" wheels, OT- To be determined, SLP-  Equipment Details: PT- , OT-  [name]/family involved in discharge planning: PT- [name],  OT-[name], SLP-   MD ELOS: 5-7 days. Medical Rehab Prognosis:  Excellent Assessment:  [name] with history of diabetes mellitus with neuropathy, hypertension, remote syncope, chronic left bundle branch block, chronic combined systolic diastolic congestive heart failure. [name] lives with daughter and assistance is needed. Independent with assistive device using quad cane prior to admission and driving short distances. Presented 11/13/2015 with dizziness/question syncope. Troponin negative. He was found to be in complete heart block. Follow-up with cardiology services. Echocardiogram ejection fraction of 45%. Systolic function was mildly to moderately reduced. Underwent placement of permanent pacemaker 11/14/2015. Pt with  resulting functional deficits with ambulation and endurance.  Will set goals for Supervision/Mod I with  therapies.    See Team Conference Notes for weekly updates to the plan of care

## 2015-11-20 ENCOUNTER — Inpatient Hospital Stay (HOSPITAL_COMMUNITY): Payer: Medicare Other | Admitting: Physical Therapy

## 2015-11-20 ENCOUNTER — Ambulatory Visit: Payer: Medicare Other

## 2015-11-20 ENCOUNTER — Inpatient Hospital Stay (HOSPITAL_COMMUNITY): Payer: Medicare Other | Admitting: Occupational Therapy

## 2015-11-20 DIAGNOSIS — Z950 Presence of cardiac pacemaker: Secondary | ICD-10-CM | POA: Insufficient documentation

## 2015-11-20 LAB — GLUCOSE, CAPILLARY
GLUCOSE-CAPILLARY: 161 mg/dL — AB (ref 65–99)
Glucose-Capillary: 111 mg/dL — ABNORMAL HIGH (ref 65–99)
Glucose-Capillary: 167 mg/dL — ABNORMAL HIGH (ref 65–99)
Glucose-Capillary: 121 mg/dL — ABNORMAL HIGH (ref 65–99)

## 2015-11-20 NOTE — Progress Notes (Signed)
Occupational Therapy Note  Patient Details  Name: [name] MRN: [id number] Date of Birth: 10/17/1930  Today's Date: 11/20/2015 OT Individual Time: 1500-1520 OT Individual Time Calculation (min): 20 min  and Today's Date: 11/20/2015 OT Missed Time: 40 Minutes Missed Time Reason: Patient fatigue;Patient unwilling/refused to participate without medical reason  Upon entering the room, pt supine in bed with no c/o pain. Pt with c/o fatigue this session and declined therapies. Pt reports, " I've done good today. I'm not doing anymore now." OT educated pt on scheduled therapy time and requirements for inpatient rehab. OT also providing education regarding OT purpose, POC, and goals in order to return home. Pt continues to refuse. Pt remained in bed with bed alarm activated. Call bell and all needed items within reach upon exiting the room.    Phineas Semen 11/20/2015, 3:38 PM

## 2015-11-20 NOTE — Progress Notes (Signed)
Physical Therapy Session Note  Patient Details  Name: [name] MRN: [id number] Date of Birth: 1930-08-17  Today's Date: 11/20/2015 PT Individual Time: [id number] AND 1305-1416 PT Individual Time Calculation (min): 58 min  AND 71 min   Short Term Goals: Week 1:  PT Short Term Goal 1 (Week 1): STG =LTG due to ELOS  Skilled Therapeutic Interventions/Progress Updates:    Session 1 Patient received siting in chair and agreeable to PT.   PT instructed in 5x STS with BUE support for 18.7 seconds. Increased fall risk indicated if >15seconds. PT also instructed patient in TUG with RW; 20.16 seconds. >13 seconds indicates increased fall risk.    PT instructed patient in Berg balance test; See below for results.   Patient instructed in Gait training with RW for 148ftx2 with supervision A from PT. PT provided min cues to keep COM closer to RW and min cues for increased step length.   Session 2. Patient received sitting in recliner and agreeable to PT.  Gait training for 339ft 162ft, 75ft, 131ft, with supervision A, RW and min cues hospital and improved AD management with turns.  Curb negotiation with min A and mod cues for AD management and proper step to gait pattern. Gait over mulch with supervision A and mod cues for improved RW management to prevent drag on loose surface. Following gait of 310ft HR: 88, SPo2 97% and Borg RPE 14/20.   Nustep for endurance training level 2 for 3 minutes and level 5 x 7 minutes. Patient reports Borg RPE at level 2 of 13/20 and at level 5 or 15/20  Stair training on 8 steps (6") with BUE support and supervision A x 2 bouts. With self selected step-over-step ascent and step-to gait with descent. Min cues for safety throughout stair training.   Sit<>supine superivision . As well as supine<>prone with Supervision A min cues for UE placement. Patient performed prone>Qped with min A from PT and and mod cues for improved UE positioning.   Patient left sitting in  recliner at end of session with call bell within reach.     Therapy Documentation Precautions:  Precautions Precautions: ICD/Pacemaker Required Braces or Orthoses: Sling Restrictions Weight Bearing Restrictions: No Vital Signs:   See above  Pain: 0/10      See Function Navigator for Current Functional Status.   Therapy/Group: Individual Therapy  Lorie Phenix 11/20/2015, 2:10 PM

## 2015-11-20 NOTE — Progress Notes (Signed)
[facility] PHYSICAL MEDICINE & REHABILITATION     PROGRESS NOTE  Subjective/Complaints:  Pt laying in bed.  He states he had a really good day of therapies yesterday and is ready to begin again today.   ROS: Denies CP, SOB, N/V/D.  Objective: Vital Signs: Blood pressure 120/67, pulse 72, temperature 98.1 F (36.7 C), temperature source Oral, resp. rate 17, height 5\' 9"  (1.753 m), weight 85.1 kg (187 lb 9.8 oz), SpO2 100 %. No results found.  Recent Labs  11/17/15 2020 11/19/15 0714  WBC 7.0 7.2  HGB 13.1 12.4*  HCT 40.1 36.6*  PLT 163 172    Recent Labs  11/17/15 2020 11/19/15 0714  NA 138 138  K 4.5 4.4  CL 104 104  GLUCOSE 96 83  BUN 13 13  CREATININE 1.02 0.91  CALCIUM 8.8* 9.0   CBG (last 3)   Recent Labs  11/19/15 1646 11/19/15 2102 11/20/15 0646  GLUCAP 162* 163* 111*    Wt Readings from Last 3 Encounters:  11/20/15 85.1 kg (187 lb 9.8 oz)  11/13/15 85.5 kg (188 lb 7.9 oz)  10/21/15 87.091 kg (192 lb)    Physical Exam:  BP 120/67 mmHg  Pulse 72  Temp(Src) 98.1 F (36.7 C) (Oral)  Resp 17  Ht 5\' 9"  (1.753 m)  Wt 85.1 kg (187 lb 9.8 oz)  BMI 27.69 kg/m2  SpO2 100% Constitutional: He is oriented to person, place, and time. He appears well-developed and well-nourished.  HENT:  Head: Normocephalic and atraumatic.  Eyes: Conjunctivae and EOM are normal.  Neck: Normal range of motion. Neck supple. No thyromegaly present.  Cardiovascular:  Cardiac rate control  Respiratory: Effort normal and breath sounds normal. No respiratory distress.  GI: Soft. Bowel sounds are normal. He exhibits no distension.  Musculoskeletal: He exhibits no edema or tenderness.  Neurological: He is alert and oriented to person, place, and time.  Motor: 4+/5 throughout Skin: Skin is warm and dry.  Pacemaker site c/d/i  Psychiatric: He has a normal mood and affect. His behavior is normal. Thought content normal  Assessment/Plan: 1. Functional deficits secondary  to complete heart block status post pacemaker/multi-medical which require 3+ hours per day of interdisciplinary therapy in a comprehensive inpatient rehab setting. Physiatrist is providing close team supervision and 24 hour management of active medical problems listed below. Physiatrist and rehab team continue to assess barriers to discharge/monitor patient progress toward functional and medical goals.  Function:  Bathing Bathing position   Position: Wheelchair/chair at sink  Bathing parts Body parts bathed by patient: Right arm, Left arm, Chest, Abdomen, Front perineal area, Buttocks, Right upper leg, Left upper leg, Right lower leg, Left lower leg Body parts bathed by helper: Back  Bathing assist Assist Level: Touching or steadying assistance(Pt > 75%)      Upper Body Dressing/Undressing Upper body dressing   What is the patient wearing?: Button up shirt         Button up shirt - Perfomed by patient: Thread/unthread right sleeve, Thread/unthread left sleeve, Pull shirt around back, Button/unbutton shirt      Upper body assist Assist Level: More than reasonable time, Supervision or verbal cues      Lower Body Dressing/Undressing Lower body dressing   What is the patient wearing?: Underwear, Pants, Socks, Shoes Underwear - Performed by patient: Thread/unthread right underwear leg, Thread/unthread left underwear leg, Pull underwear up/down   Pants- Performed by patient: Thread/unthread right pants leg, Thread/unthread left pants leg, Pull pants up/down, Fasten/unfasten  pants       Socks - Performed by patient: Don/doff right sock, Don/doff left sock   Shoes - Performed by patient: Don/doff right shoe, Don/doff left shoe, Fasten right, Fasten left            Lower body assist Assist for lower body dressing: Touching or steadying assistance (Pt > 75%), More than reasonable time      Toileting Toileting   Toileting steps completed by patient: Adjust clothing prior to  toileting, Performs perineal hygiene, Adjust clothing after toileting   Toileting Assistive Devices: Grab bar or rail  Toileting assist Assist level: Touching or steadying assistance (Pt.75%)   Transfers Chair/bed transfer   Chair/bed transfer method: Stand pivot Chair/bed transfer assist level: Touching or steadying assistance (Pt > 75%) Chair/bed transfer assistive device: Armrests     Locomotion Ambulation     Max distance: 160ft Assist level: Supervision or verbal cues   Wheelchair Wheelchair activity did not occur: N/A        Cognition Comprehension Comprehension assist level: Understands basic 90% of the time/cues < 10% of the time  Expression Expression assist level: Expresses basic 90% of the time/requires cueing < 10% of the time.  Social Interaction Social Interaction assist level: Interacts appropriately with others - No medications needed.  Problem Solving Problem solving assist level: Solves basic 90% of the time/requires cueing < 10% of the time  Memory Memory assist level: Recognizes or recalls 90% of the time/requires cueing < 10% of the time     Medical Problem List and Plan: 1. Debilitation secondary to complete heart block status post pacemaker/multi-medical  Cont CIR 2. DVT Prophylaxis/Anticoagulation: SCDs. Monitor for any signs of DVT 3. Pain Management: Neurontin 300 mg 3 times a day 4. Hypertension. Avapro 75 mg daily, Lopressor 25 mg twice a day.   Monitor with increased mobility 5. Neuropsych: This patient is capable of making decisions on his own behalf. 6. Skin/Wound Care: Routine skin checks 7. Fluids/Electrolytes/Nutrition: Routine I&O's   BMP within acceptable range on 7/12 8. Chronic combined systolic and diastolic heart failure. Lasix 20 mg daily. Monitor for any signs of fluid overload Filed Weights   11/18/15 1607 11/19/15 0500 11/20/15 0500  Weight: 85.503 kg (188 lb 8 oz) 85.4 kg (188 lb 4.4 oz) 85.1 kg (187 lb 9.8 oz)   9.  Diabetes mellitus with peripheral neuropathy. Hemoglobin A1c 7.3.   Lantus insulin 20 units daily at bedtime.    Check blood sugars before meals and at bedtime.   Diabetic teaching  Will cont to monitor 10. LBBB: Monitor 11. ABLA:   Hb 12.4 on 7/12  Cont to monitor 12. Tachycardia:   Resolved  Likely due to deconditioning. Cont to monitor.  13. Tachpnea:   Resolved  Likely due to deconditioning. Cont to monitor.   LOS (Days) 2 A FACE TO FACE EVALUATION WAS PERFORMED  Ankit Lorie Phenix 11/20/2015 10:37 AM

## 2015-11-21 ENCOUNTER — Inpatient Hospital Stay (HOSPITAL_COMMUNITY): Payer: Medicare Other | Admitting: Physical Therapy

## 2015-11-21 ENCOUNTER — Inpatient Hospital Stay (HOSPITAL_COMMUNITY): Payer: Medicare Other | Admitting: Occupational Therapy

## 2015-11-21 ENCOUNTER — Inpatient Hospital Stay (HOSPITAL_COMMUNITY): Payer: Medicare Other | Admitting: *Deleted

## 2015-11-21 LAB — GLUCOSE, CAPILLARY
GLUCOSE-CAPILLARY: 240 mg/dL — AB (ref 65–99)
Glucose-Capillary: 84 mg/dL (ref 65–99)
Glucose-Capillary: 284 mg/dL — ABNORMAL HIGH (ref 65–99)
Glucose-Capillary: 72 mg/dL (ref 65–99)

## 2015-11-21 NOTE — Progress Notes (Signed)
Social Work Patient ID: [name], male   DOB: 1930/06/26, 82 y.o.   MRN: [id number] Met with pt to inform have yet to hear from daughter regarding arranging family education. Pt will see if he can get her to come in.

## 2015-11-21 NOTE — Progress Notes (Signed)
Social Work Patient ID: [name], male   DOB: [birth year], 82 y.o.   MRN: [id number] Team feels pt will be ready for discharge Tuesday and his goals will be met. MD reports medically stable for discharge then. Have attempted to contact daughter to schedule family education without success. Will continue to try and work on discharge needs.

## 2015-11-21 NOTE — Progress Notes (Signed)
Occupational Therapy Session Note  Patient Details  Name: [name] MRN: [place]:[id number] Date of Birth: [birth year]  Today's Date: 11/21/2015 OT Individual Time: [id number] OT Individual Time Calculation (min): 59 min    Short Term Goals: Week 1:  OT Short Term Goal 1 (Week 1): STGs=LTGs  Skilled Therapeutic Interventions/Progress Updates:    Pt seen for morning completion of BADLs. Pt was in bed prior to skilled OT arrival and agreeable to participate in tx. Pt completed supine to sit with supervision and HOB at 0 degrees to simulate bed at home. Pt ambulated to chair at sink with supervision for mod vcs for safety with hand placement during transfers. Pt required mod questioning cues during session in regards to safety with hand placement during sit to stand/stand to sit transfers. UB ADLs completed with supervision today. Min A required for LB dressing due to pt report of "heart pain." Pt was provided a rest break. BP assessed: 147/67, 02 sats 93% on room air, and 69BPM. Pt reported feeling better after break, and continued with dressing tasks. Nursing was notified. Shaving and grooming tasks completed sitting at sink with setup. Pt was inquired about missing tx yesterday with pt reporting not wanting extra therapy and not anticipating therapy's arrival. Pt was provided education on benefits of therapy and encouragement for active participation with verbalized understanding. Pt was oriented to today's therapy schedule, and left in recliner with all needs within reach at end of session. Per discussion with PT, tentative d/c date next Tuesday (7/18) due to pts progress. No c/o pain at end of session. Will have pt retrieve own clothing articles next session to further progress.    Therapy Documentation Precautions:  Precautions Precautions: ICD/Pacemaker Required Braces or Orthoses: Sling Restrictions Weight Bearing Restrictions: No General:   Vital Signs: Therapy Vitals Temp: 97.7 F (36.5  C) Temp Source: Oral Pulse Rate: 72 Resp: 16 BP: (!) 107/58 mmHg Patient Position (if appropriate): Sitting Oxygen Therapy SpO2: 100 % O2 Device: Not Delivered:    See Function Navigator for Current Functional Status.   Therapy/Group: Individual Therapy  Frederika Hukill A Laura Caldas 11/21/2015, 3:56 PM

## 2015-11-21 NOTE — Progress Notes (Signed)
Physical Therapy Session Note  Patient Details  Name: [name] MRN: [place]:[id number] Date of Birth: [birth year]  Today's Date: 11/21/2015 PT Group Time: 1000-1115 PT Group Time Calculation (min): 75 min  Short Term Goals: Week 1:  PT Short Term Goal 1 (Week 1): STG =LTG due to ELOS  Therapy Documentation Precautions:  Precautions Precautions: ICD/Pacemaker Required Braces or Orthoses: Sling Restrictions Weight Bearing Restrictions: No  Patient participated in stride right group    Transfers: Sit to and from stand transfer with close supervision; verbal cues for hand placement and controlled descent.    Ambulation: Patient ambulated 155 feet and 175 feet, and 200 feet with rolling walker close supervision.  Patient ambulated with a step through gait pattern. Verbal cues for increased step length. Patient demonstrates uneven step and stride length with a varying cadence. Verbal cues for upright posture and pacing.    Seated there ex: B Long arc quads 3x10 B ankle pumps 3x10 B hip flexion 3x10   Stair negotiation: Patient negotiated 12 steps with left handrail and min assist. Patient performed stairs with a step to pattern. Patient educated on proper sequence and technique and was able to return demonstration with minimal verbal cues.  Standing ther ex: Heel raises 30x  Mini squats 30x.  Patient performed side stepping 25 feetx2 with RW min assist emphasis placed on hip abduction, upright posture and hip abduction.    Patient performed retrograde walking 25 feetx2 min assist emphasis placed on hip extension.  Patient tolerated treatment well. Vitals monitored and remained stable throughout session responding appropriately to activity. Patient was without pain during session. Patient tolerated session with rest breaks throughout. Patient returned to room at end of session seated in recliner chair. Call bell within reach and patient educated not to be up without assistance.  Patient verbalized understanding.    See Function Navigator for Current Functional Status.   Therapy/Group: Group Therapy  Retta Diones 11/21/2015, 11:41 AM

## 2015-11-21 NOTE — Progress Notes (Signed)
[facility] PHYSICAL MEDICINE & REHABILITATION     PROGRESS NOTE  Subjective/Complaints:  Pt laying in bed this AM.  He is in his usual good spirits.  He does not have any complaints.   ROS: Denies CP, SOB, N/V/D.  Objective: Vital Signs: Blood pressure 117/69, pulse 74, temperature 97.8 F (36.6 C), temperature source Oral, resp. rate 18, height 5\' 9"  (1.753 m), weight 85.3 kg (188 lb 0.8 oz), SpO2 100 %. No results found.  Recent Labs  11/19/15 0714  WBC 7.2  HGB 12.4*  HCT 36.6*  PLT 172    Recent Labs  11/19/15 0714  NA 138  K 4.4  CL 104  GLUCOSE 83  BUN 13  CREATININE 0.91  CALCIUM 9.0   CBG (last 3)   Recent Labs  11/20/15 1648 11/20/15 2057 11/21/15 0654  GLUCAP 167* 121* 84    Wt Readings from Last 3 Encounters:  11/21/15 85.3 kg (188 lb 0.8 oz)  11/13/15 85.5 kg (188 lb 7.9 oz)  10/21/15 87.091 kg (192 lb)    Physical Exam:  BP 117/69 mmHg  Pulse 74  Temp(Src) 97.8 F (36.6 C) (Oral)  Resp 18  Ht 5\' 9"  (1.753 m)  Wt 85.3 kg (188 lb 0.8 oz)  BMI 27.76 kg/m2  SpO2 100% Constitutional: He appears well-developed and well-nourished. NAD HENT: Normocephalic and atraumatic.  Eyes: Conjunctivae and EOM are normal.  Cardiovascular:  Cardiac rate control  Respiratory: Effort normal and breath sounds normal. No respiratory distress.  GI: Soft. Bowel sounds are normal. He exhibits no distension.  Musculoskeletal: He exhibits no edema or tenderness.  Neurological: He is alert and oriented to person, place, and time.  Motor: 4+/5 throughout Skin: Skin is warm and dry.  Pacemaker site c/d/i  Psychiatric: He has a normal mood and affect. His behavior is normal. Thought content normal  Assessment/Plan: 1. Functional deficits secondary to complete heart block status post pacemaker/multi-medical which require 3+ hours per day of interdisciplinary therapy in a comprehensive inpatient rehab setting. Physiatrist is providing close team supervision  and 24 hour management of active medical problems listed below. Physiatrist and rehab team continue to assess barriers to discharge/monitor patient progress toward functional and medical goals.  Function:  Bathing Bathing position   Position: Wheelchair/chair at sink  Bathing parts Body parts bathed by patient: Right arm, Left arm, Chest, Abdomen, Front perineal area, Buttocks, Right upper leg, Left upper leg, Right lower leg, Left lower leg Body parts bathed by helper: Back  Bathing assist Assist Level: Touching or steadying assistance(Pt > 75%)      Upper Body Dressing/Undressing Upper body dressing   What is the patient wearing?: Button up shirt         Button up shirt - Perfomed by patient: Thread/unthread right sleeve, Thread/unthread left sleeve, Pull shirt around back, Button/unbutton shirt      Upper body assist Assist Level: More than reasonable time, Supervision or verbal cues      Lower Body Dressing/Undressing Lower body dressing   What is the patient wearing?: Underwear, Pants, Socks, Shoes Underwear - Performed by patient: Thread/unthread right underwear leg, Thread/unthread left underwear leg, Pull underwear up/down   Pants- Performed by patient: Thread/unthread right pants leg, Thread/unthread left pants leg, Pull pants up/down, Fasten/unfasten pants       Socks - Performed by patient: Don/doff right sock, Don/doff left sock   Shoes - Performed by patient: Don/doff right shoe, Don/doff left shoe, Fasten right, Fasten left  Lower body assist Assist for lower body dressing: Touching or steadying assistance (Pt > 75%), More than reasonable time      Toileting Toileting   Toileting steps completed by patient: Adjust clothing prior to toileting, Performs perineal hygiene, Adjust clothing after toileting   Toileting Assistive Devices: Grab bar or rail  Toileting assist Assist level: Touching or steadying assistance (Pt.75%)    Transfers Chair/bed transfer   Chair/bed transfer method: Ambulatory Chair/bed transfer assist level: Supervision or verbal cues Chair/bed transfer assistive device: Armrests, Medical sales representative     Max distance: 320ft Assist level: Supervision or Psychologist, clinical activity did not occur: N/A        Cognition Comprehension Comprehension assist level: Understands basic 90% of the time/cues < 10% of the time  Expression Expression assist level: Expresses basic 90% of the time/requires cueing < 10% of the time.  Social Interaction Social Interaction assist level: Interacts appropriately with others - No medications needed.  Problem Solving Problem solving assist level: Solves basic 90% of the time/requires cueing < 10% of the time  Memory Memory assist level: Recognizes or recalls 90% of the time/requires cueing < 10% of the time     Medical Problem List and Plan: 1. Debilitation secondary to complete heart block status post pacemaker/multi-medical  Cont CIR, pt making progress with therapies 2. DVT Prophylaxis/Anticoagulation: SCDs. Monitor for any signs of DVT 3. Pain Management: Neurontin 300 mg 3 times a day 4. Hypertension. Avapro 75 mg daily, Lopressor 25 mg twice a day.   Monitor with increased mobility 5. Neuropsych: This patient is capable of making decisions on his own behalf. 6. Skin/Wound Care: Routine skin checks 7. Fluids/Electrolytes/Nutrition: Routine I&O's   BMP within acceptable range on 7/12 8. Chronic combined systolic and diastolic heart failure. Lasix 20 mg daily. Monitor for any signs of fluid overload Filed Weights   11/19/15 0500 11/20/15 0500 11/21/15 0518  Weight: 85.4 kg (188 lb 4.4 oz) 85.1 kg (187 lb 9.8 oz) 85.3 kg (188 lb 0.8 oz)   9. Diabetes mellitus with peripheral neuropathy. Hemoglobin A1c 7.3.   Lantus insulin 20 units daily at bedtime.    Check blood sugars before meals and at bedtime.   Diabetic  teaching  Will cont to monitor 10. LBBB: Monitor 11. ABLA:   Hb 12.4 on 7/12  Cont to monitor 12. Tachycardia:   Resolved  Likely due to deconditioning. Cont to monitor.  13. Tachpnea:   Resolved  Likely due to deconditioning. Cont to monitor.   LOS (Days) 3 A FACE TO FACE EVALUATION WAS PERFORMED  Ankit Lorie Phenix 11/21/2015 10:06 AM

## 2015-11-21 NOTE — Progress Notes (Signed)
Physical Therapy Session Note  Patient Details  Name: [name] MRN: [id number] Date of Birth: 1931-05-04  Today's Date: 11/21/2015 PT Individual Time: 1300-1416 PT Individual Time Calculation (min): 76 min   Short Term Goals: Week 1:  PT Short Term Goal 1 (Week 1): STG =LTG due to ELOS  Skilled Therapeutic Interventions/Progress Updates:    Patient received sitting in Recliner and agreeable to PT. Patient performed sit<>stand with RW from recliner with supervision A from PT. Gait training in controlled environment on tile floor and carpeted surface for 172f x 2 and 2555fwith RW and supervision A only for floor transitions, no cues required and AD management. Gait training in simulated community environments with RW including uneven cement sidewalk for 24056fnd carpeted gift shop for [id number] provided supervision A and min cues for AD management on uphill grade and to prevent loss of controll of RW with cracks in side walk.   Balance training.  Lateral and anterior pertubations with supervision A from PT Standing on blue wedge for 1 minutes with min A to prevent posterior LOB x 3. Standing on blue wedge for lateral reaches L and R. X 10 BUE. Min A from PT for improved stability and to prevent posterior LOB PT provided moderate multimodal cues to improve use of ankle strategty to prevent LOB as well as improve posture and increase weight shifts as toleraterated.    Patient instructed in Floor transfer from tall kneeling with mod A from PT and max cues for proper sequencing and LE and UE positioning through half kneeling to allow increased stability and successful movement.   Patient returned to room and left sitting in Recliner with all needs met and call bell in reach. Throughout PT HR remained <87 BPM.       Therapy Documentation Precautions:  Precautions Precautions: ICD/Pacemaker Required Braces or Orthoses: Sling Restrictions Weight Bearing Restrictions: No General:    Vital Signs: Therapy Vitals Temp: 97.7 F (36.5 C) Temp Source: Oral Pulse Rate: 72 Resp: 16 BP: (!) 107/58 mmHg Patient Position (if appropriate): Sitting Oxygen Therapy SpO2: 100 % O2 Device: Not Delivered Pain:   0/10    See Function Navigator for Current Functional Status.   Therapy/Group: Individual Therapy  AustLorie Phenix4/2017, 4:32 PM

## 2015-11-22 ENCOUNTER — Inpatient Hospital Stay (HOSPITAL_COMMUNITY): Payer: Medicare Other

## 2015-11-22 ENCOUNTER — Inpatient Hospital Stay (HOSPITAL_COMMUNITY): Payer: Medicare Other | Admitting: Occupational Therapy

## 2015-11-22 ENCOUNTER — Inpatient Hospital Stay (HOSPITAL_COMMUNITY): Payer: Medicare Other | Admitting: Physical Therapy

## 2015-11-22 LAB — GLUCOSE, CAPILLARY
GLUCOSE-CAPILLARY: 180 mg/dL — AB (ref 65–99)
Glucose-Capillary: 60 mg/dL — ABNORMAL LOW (ref 65–99)
Glucose-Capillary: 68 mg/dL (ref 65–99)
Glucose-Capillary: 95 mg/dL (ref 65–99)
Glucose-Capillary: 222 mg/dL — ABNORMAL HIGH (ref 65–99)
Glucose-Capillary: 186 mg/dL — ABNORMAL HIGH (ref 65–99)

## 2015-11-22 NOTE — Progress Notes (Addendum)
Physical Therapy Note  Patient Details  Name: JUNE KOESTLER MRN: [id number] Date of Birth: [birth year] Today's Date: 11/22/2015  1400-1415, 15 min individual tx Stride Right group; 1415-1500, 45 min group Pain:none noted  Pt treated individually for 15 min as other pt in group was late.    Gait with Rw x 150' with supervision.  Up/down 12 steps 2 rails, step through close supervision.  Up/down curb and ramp with Rw, min guard assist.  Seated activities included R and L hip abduction activities, marching, heel/toe raises, bil hip adduction with neutral hip rotation, hip flex/extension with pelvic tilts with bil hands on large blue therapy ball to facilitate forward wt shift. Advanced gait without AD, mod assist for balance, while stepping laterally R/L while "nudging" Bosu ball on floor. W/c> recliner stand pivot with supervision. Pt left resting in recliner with friends visiting and all needs within reach.  Raevon Broom 11/22/2015, 12:30 PM

## 2015-11-22 NOTE — Progress Notes (Signed)
Physical Therapy Session Note  Patient Details  Name: [name] MRN: [id number] Date of Birth: May 14, 1930  Today's Date: 11/22/2015 PT Individual Time: 0801-0900 PT Individual Time Calculation (min): 59 min   Short Term Goals: Week 1:  PT Short Term Goal 1 (Week 1): STG =LTG due to ELOS  Skilled Therapeutic Interventions/Progress Updates:    Patient received supine in bed and agreeable to PT.  Bed mobility for supine>sit without cues or assistance from PT. PT instructed patient in upper in LB dressing without cues or assistance for Upper body dressing and min A for Lower body clothing management to fit condom catheter bag through pant leg. Patient performed sit<>stand with supervision A to don pants and no assist to don shoes.   Sit<>stand transfers completed x 12 throughout PT with occasional cues for safety with transfer and increased use of UE. Patient able to perform safe transfer 90% of the following instructed from PT  Patient performed Gait training for 263ft and 227ft on level surface in controlled environment with RW, no cues or assistance required from PT.   Balance training: Toe taps on 6 instep 2x 10 BLE Supervision A with intermittent Min A due to poor foot clearance with step back to ground. Patient demonstrated improved stability on second bout compared to first.  Forward and lateral stepping to 1 of 2 targets for each LE 2x 10 BLE. PT provided Supervision with min A x 2 to prevent posterior/lateral LOB to L. PT provided min cues for increased step length and increased foot clearance with LLE.   Patient returned to room and left sitting in Recliner with call bell in reach.    Therapy Documentation Precautions:  Precautions Precautions: ICD/Pacemaker Required Braces or Orthoses: Sling Restrictions Weight Bearing Restrictions: No General:   Vital Signs:  Pain: 0/10     See Function Navigator for Current Functional Status.   Therapy/Group: Individual  Therapy  Lorie Phenix 11/22/2015, 5:22 PM

## 2015-11-22 NOTE — Progress Notes (Signed)
Occupational Therapy Session Note  Patient Details  Name: [name] MRN: [id number] Date of Birth: 01-08-1931  Today's Date: 11/22/2015 OT Individual Time: 1017-1130 OT Individual Time Calculation (min): 73 min    Short Term Goals: Week 1:  OT Short Term Goal 1 (Week 1): STGs=LTGs  Skilled Therapeutic Interventions/Progress Updates:    Pt participated in skilled OT session focusing on self sequencing, problem solving, and safety awareness during ADLs. Pt gathered bathing items today with supervision for vcs on appropriate items to collect (i.e. Pt began only gathering wash cloths). Pt required orientation to day of the week and current length of stay due to pt reporting having been here for 11 days. Pt completed UB/LB ADLs with overall supervision for instruction on safe hand placement while leaning forward to complete LB dressing and safety with sit to stands. Pt completed tub bench transfer in tub room with RW and supervision for instruction on proper technique without using grab bars. Pt was able to demonstrate/verbalize teach back method via self demonstration and therapist simulated demonstration with RW. Pt completed toilet transfer with low toilet in room without grab bars with Min A for steadying during transition from sit to stand. Pt continues to benefit from transferring from low toilet for safe d/c to home environment.  Mod instructional and questioning cues for safety awareness required throughout session. Pt left in recliner at end of session with all needs within reach.   Therapy Documentation Precautions:  Precautions Precautions: ICD/Pacemaker Required Braces or Orthoses: Sling Restrictions Weight Bearing Restrictions: No  Therapy Vitals Pulse Rate: 73 BP: (!) 109/53 mmHg Pain: No c/o pain Pain Assessment Pain Assessment: No/denies pain     See Function Navigator for Current Functional Status.   Therapy/Group: Individual Therapy  Narcissa Melder A  Nakiea Metzner 11/22/2015, 12:51 PM

## 2015-11-22 NOTE — Progress Notes (Signed)
Patient ID: [name], male   DOB: 03-Dec-1930, 82 y.o.   MRN: [id number]   11/22/15.  [place] [facility]     PROGRESS NOTE  82 year old patient admitted for CIR with Debilitation secondary to complete heart block status post pacemaker/multi-medical issues.  Past Medical History  Diagnosis Date  . Diabetes mellitus   . ED (erectile dysfunction)   . Hypertension   . Hyperlipidemia   . Pneumonia     hx of  . CHF (congestive heart failure) ([place])   . Urination frequency   . Cancer ([place])     hx of prostate ca  . Neuromuscular disorder (HCC)     numbness in hand/cervical issues  . Cataracts, bilateral     hx of  . Syncope     hx of     Subjective/Complaints:  Pt laying in bed this AM.  He is in his usual good spirits.  His only complaint is urinary frequency, but he does have a condom catheter  ROS: Denies CP, SOB, N/V/D.  Objective: Vital Signs: Blood pressure 109/53, pulse 73, temperature 97.8 F (36.6 C), temperature source Oral, resp. rate 18, height 5\' 9"  (1.753 m), weight 183 lb 1.6 oz (83.054 kg), SpO2 100 %. No results found. No results for input(s): WBC, HGB, HCT, PLT in the last 72 hours. No results for input(s): NA, K, CL, GLUCOSE, BUN, CREATININE, CALCIUM in the last 72 hours.  Invalid input(s): CO CBG (last 3)   Recent Labs  11/22/15 0642 11/22/15 0701 11/22/15 0720  GLUCAP 60* 68 95   BP Readings from Last 3 Encounters:  11/22/15 109/53  11/18/15 126/79  10/21/15 136/72   Lab Results  Component Value Date   HGBA1C 7.3* 11/14/2015   Wt Readings from Last 3 Encounters:  11/22/15 183 lb 1.6 oz (83.054 kg)  11/13/15 188 lb 7.9 oz (85.5 kg)  10/21/15 192 lb (87.091 kg)    Physical Exam:  BP 109/53 mmHg  Pulse 73  Temp(Src) 97.8 F (36.6 C) (Oral)  Resp 18  Ht 5\' 9"  (1.753 m)  Wt 183 lb 1.6 oz (83.054 kg)  BMI 27.03 kg/m2  SpO2 100% Constitutional: He appears well-developed and well-nourished. NAD HENT:  Normocephalic and atraumatic.  Eyes: Conjunctivae and EOM are normal.  Cardiovascular:  Cardiac rate control  Respiratory: Effort normal and breath sounds normal. No respiratory distress.  GI: Soft. Bowel sounds are normal. He exhibits no distension.  Musculoskeletal: He exhibits no edema or tenderness.  Neurological: He is alert and oriented to person, place, and time.  Motor: 4+/5 throughout Skin: Skin is warm and dry.  Pacemaker site c/d/i  Psychiatric: He has a normal mood and affect. His behavior is normal. Thought content normal GU- Condom cath in place     Medical Problem List and Plan: 1. Debilitation secondary to complete heart block status post pacemaker/multi-medical  Cont CIR, pt making progress with therapies 2. DVT Prophylaxis/Anticoagulation: SCDs. Monitor for any signs of DVT 3. Pain Management: Neurontin 300 mg 3 times a day 4. Hypertension. Avapro 75 mg daily, Lopressor 25 mg twice a day.   Monitor with increased mobility  5. Chronic combined systolic and diastolic heart failure. Lasix 20 mg daily. Monitor for any signs of fluid overload Filed Weights   11/20/15 0500 11/21/15 0518 11/22/15 0429  Weight: 187 lb 9.8 oz (85.1 kg) 188 lb 0.8 oz (85.3 kg) 183 lb 1.6 oz (83.054 kg)   6. Diabetes mellitus with peripheral neuropathy. Hemoglobin  A1c 7.3.   Lantus insulin 20 units daily at bedtime.    Check blood sugars before meals and at bedtime.   Diabetic teaching  Will cont to monitor; tight control.  Will observe closely for hypoglycemia.  May need dose reduction of insulin therapy  7. ABLA:   Hb 12.4 on 7/12  Cont to monitor    LOS (Days) 4 A FACE TO FACE EVALUATION WAS PERFORMED  Nyoka Cowden 11/22/2015 10:38 AM

## 2015-11-23 ENCOUNTER — Inpatient Hospital Stay (HOSPITAL_COMMUNITY): Payer: Medicare Other | Admitting: Occupational Therapy

## 2015-11-23 LAB — GLUCOSE, CAPILLARY
GLUCOSE-CAPILLARY: 139 mg/dL — AB (ref 65–99)
GLUCOSE-CAPILLARY: 102 mg/dL — AB (ref 65–99)
GLUCOSE-CAPILLARY: 161 mg/dL — AB (ref 65–99)
Glucose-Capillary: 133 mg/dL — ABNORMAL HIGH (ref 65–99)

## 2015-11-23 NOTE — Progress Notes (Signed)
Patient wearing condom catheter and states he has urgency and sometimes has accidents because he cannot get the urinal in time.  Explained to patient the importance of bladder management in regards to discharge.  Patient hesitant to remove catheter but has agreed to trial a urinal with a brief.  Will continue to monitor.

## 2015-11-23 NOTE — Progress Notes (Signed)
Patient ID: JOFFRE CABREROS, male   DOB: 1931/04/28, 82 y.o.   MRN: [id number]   Patient ID: LARICO BLOEDORN, male   DOB: [birth year], 83 y.o.   MRN: [id number]   11/23/15.  [place] [facility] RE[facility]IL[facility]     PROGRESS NOTE  82 year old patient admitted for CIR with Debilitation secondary to complete heart block status post pacemaker/multi-medical issues.  Past Medical History  Diagnosis Date  . Diabetes mellitus   . ED (erectile dysfunction)   . Hypertension   . Hyperlipidemia   . Pneumonia     hx of  . CHF (congestive heart failure) ([place])   . Urination frequency   . Cancer ([place])     hx of prostate ca  . Neuromuscular disorder (HCC)     numbness in hand/cervical issues  . Cataracts, bilateral     hx of  . Syncope     hx of     Subjective/Complaints:   No new complaints or concerns.  Urinary frequency seems less of an issue today  ROS: Denies CP, SOB, N/V/D.  Objective: Vital Signs: Blood pressure 118/69, pulse 61, temperature 97.8 F (36.6 C), temperature source Oral, resp. rate 18, height 5\' 9"  (1.753 m), weight 186 lb 4.6 oz (84.5 kg), SpO2 98 %. No results found. No results for input(s): WBC, HGB, HCT, PLT in the last 72 hours. No results for input(s): NA, K, CL, GLUCOSE, BUN, CREATININE, CALCIUM in the last 72 hours.  Invalid input(s): CO CBG (last 3)   Recent Labs  11/22/15 1708 11/22/15 2053 11/23/15 0643  GLUCAP 186* 180* 133*   BP Readings from Last 3 Encounters:  11/23/15 118/69  11/18/15 126/79  10/21/15 136/72   Lab Results  Component Value Date   HGBA1C 7.3* 11/14/2015   Wt Readings from Last 3 Encounters:  11/23/15 186 lb 4.6 oz (84.5 kg)  11/13/15 188 lb 7.9 oz (85.5 kg)  10/21/15 192 lb (87.091 kg)    Physical Exam:  BP 118/69 mmHg  Pulse 61  Temp(Src) 97.8 F (36.6 C) (Oral)  Resp 18  Ht 5\' 9"  (1.753 m)  Wt 186 lb 4.6 oz (84.5 kg)  BMI 27.50 kg/m2  SpO2 98% Constitutional: He appears well-developed and  well-nourished. NAD HENT: Normocephalic and atraumatic.  Eyes: Conjunctivae and EOM are normal.  Cardiovascular:  Cardiac rate control  Respiratory: Effort normal and breath sounds normal. No respiratory distress.  GI: Soft. Bowel sounds are normal. He exhibits no distension.  Musculoskeletal: He exhibits no edema or tenderness.  Neurological: He is alert and oriented to person, place, and time.  Motor: 4+/5 throughout Skin: Skin is warm and dry.  Pacemaker site c/d/i  Psychiatric: He has a normal mood and affect. His behavior is normal. Thought content normal GU- Condom cath in place     Medical Problem List and Plan: 1. Debilitation secondary to complete heart block status post pacemaker/multi-medical  Cont CIR, pt making progress with therapies 2. DVT Prophylaxis/Anticoagulation: SCDs. Monitor for any signs of DVT 3. Pain Management: Neurontin 300 mg 3 times a day 4. Hypertension. Avapro 75 mg daily, Lopressor 25 mg twice a day.   Monitor with increased mobility  5. Chronic combined systolic and diastolic heart failure. Lasix 20 mg daily. Monitor for any signs of fluid overload Filed Weights   11/21/15 0518 11/22/15 0429 11/23/15 0426  Weight: 188 lb 0.8 oz (85.3 kg) 183 lb 1.6 oz (83.054 kg) 186 lb 4.6 oz (84.5 kg)   6. Diabetes  mellitus with peripheral neuropathy. Hemoglobin A1c 7.3.   Lantus insulin 20 units daily at bedtime.    Check blood sugars before meals and at bedtime.   Diabetic teaching  Will cont to monitor; tight control.  Will observe closely for hypoglycemia.  May need dose reduction of insulin therapy  7. ABLA:   Hb 12.4 on 7/12  Cont to monitor    LOS (Days) 5 A FACE TO FACE EVALUATION WAS PERFORMED  Nyoka Cowden 11/23/2015 9:29 AM

## 2015-11-23 NOTE — Progress Notes (Signed)
Occupational Therapy Session Note  Patient Details  Name: [name] MRN: [id number] Date of Birth: 1931/01/08  Today's Date: 11/23/2015 OT Individual Time: 1002-1100 OT Individual Time Calculation (min): 58 min    Skilled Therapeutic Interventions/Progress Updates:    Skilled OT session today focused on bathing completion in shower with nursing permission. PPM covered for protection. Pt gathered ADL items with RW with supervision for collecting all necessary items and safety with hand placement. Pt transferred to tub bench in shower with supervision for instruction on technique. Pt completed UB/LB bathing with supervision for instruction on sequencing "what do I do next?" Pt provided questioning cues with pt able to self problem solve 90% of time. Pt requires cues to prompt problem solving. Pt completed ADLs standing sinking as needed in chair.Pt still requires intermittent cues for ADL tasks, (i.e. Zipping pants or tying shoes). Min A required for LB dressing due to new catheter today. Nursing reported that pt declined removal of catheter this morning. With therapeutic discussion, pt agreed to have catheter removed to decrease BOC at time of discharge. Nursing made aware. Shaving completed w/c level at sink with setup. Pt nicked upper lip. Nursing notified and attended to skin tear. Pt left in recliner at end of session with all needs within reach. Due to pt still requiring min-mod vcs for carryover of safety and sequencing during self care, goals downgraded to Supervision level. Grad day is tomorrow.   Therapy Documentation Precautions:  Precautions Precautions: ICD/Pacemaker Required Braces or Orthoses: Sling Restrictions Weight Bearing Restrictions: No General:   Vital Signs: Therapy Vitals Temp: 97.7 F (36.5 C) Temp Source: Oral Pulse Rate: 70 Resp: 17 BP: (!) 114/57 mmHg Patient Position (if appropriate): Sitting Oxygen Therapy SpO2: 100 % O2 Device: Not Delivered Pain: No  c/o pain during session       See Function Navigator for Current Functional Status.   Therapy/Group: Individual Therapy  Milayah Krell A Yazlin Ekblad 11/23/2015, 3:17 PM

## 2015-11-23 NOTE — Plan of Care (Signed)
Problem: RH Dressing Goal: LTG Patient will perform upper body dressing (OT) LTG Patient will perform upper body dressing with assist, with/without cues (OT).  Due to continued requirement of safety cues and sequencing  Goal: LTG Patient will perform lower body dressing w/assist (OT) LTG: Patient will perform lower body dressing with assist, with/without cues in positioning using equipment (OT)  Due to continued cuing for safety education and sequencing   Problem: RH Toileting Goal: LTG Patient will perform toileting w/assist, cues/equip (OT) LTG: Patient will perform toiletiing (clothes management/hygiene) with assist, with/without cues using equipment (OT)  Due to continued cuing for safety education and sequencing

## 2015-11-24 ENCOUNTER — Inpatient Hospital Stay (HOSPITAL_COMMUNITY): Payer: Medicare Other | Admitting: Occupational Therapy

## 2015-11-24 ENCOUNTER — Inpatient Hospital Stay (HOSPITAL_COMMUNITY): Payer: Medicare Other | Admitting: Physical Therapy

## 2015-11-24 LAB — GLUCOSE, CAPILLARY
GLUCOSE-CAPILLARY: 105 mg/dL — AB (ref 65–99)
Glucose-Capillary: 143 mg/dL — ABNORMAL HIGH (ref 65–99)
Glucose-Capillary: 151 mg/dL — ABNORMAL HIGH (ref 65–99)
Glucose-Capillary: 130 mg/dL — ABNORMAL HIGH (ref 65–99)

## 2015-11-24 NOTE — Discharge Summary (Signed)
Discharge summary job # [id number]

## 2015-11-24 NOTE — Progress Notes (Signed)
Occupational Therapy Session Note  Patient Details  Name: LAURENS SAHAI MRN: [id number] Date of Birth: [birth year]  Today's Date: 11/24/2015 OT Individual Time: [id number] OT Individual Time Calculation (min): 70 min   Short Term Goals: Week 1:  OT Short Term Goal 1 (Week 1): STGs=LTGs  Skilled Therapeutic Interventions/Progress Updates:  Patient found seated in recliner with no complaints of pain. SW and evaluating OT found this therapist prior to entering room. Pt supposed to d/c tomorrow and plan for this session was family education with patient's daughter. Patient's daughter present. Discussed importance of 24/7 supervision at all times post CIR discharge. Also discussed importance of patient not driving and importance of discussing this with patient's doctor. Pt ambulated from room to ADL apartment and practiced transfer onto couch. From here, pt ambulated into BR and practiced transfer onto tub transfer bench. Pt then ambulated to ortho gym for practice on car transfer. Then ambulated to therapy gym and practiced ambulating up/down stairs. Pt completed NuStep exercise for 6 minutes on level 6 (somewhat hard for patient). Pt then engaged in therapeutic activity focusing on dynamic standing balance, pt standing on aeromat to increase difficulty/modify activity. Continued to emphasize importance of patient's daughter being with him at all times and no driving at this time as well. Discussed RW safety as well during sit to/from and functional ambulation. Pt ambulated back to room and therapist left pt seated in recliner with daughter present and all needs within reach.   Therapy Documentation Precautions:  Precautions Precautions: ICD/Pacemaker Precaution Comments: pacemaker Required Braces or Orthoses: Sling Restrictions Weight Bearing Restrictions: No  Vital Signs: Therapy Vitals Temp: 98 F (36.7 C) Temp Source: Oral Pulse Rate: 77 Resp: 18 BP: 132/77 mmHg Patient Position (if  appropriate): Lying Oxygen Therapy SpO2: 100 % O2 Device: Not Delivered  See Function Navigator for Current Functional Status.  Therapy/Group: Individual Therapy  Chrys Racer , MS, OTR/L, CLT  11/24/2015, 3:58 PM

## 2015-11-24 NOTE — Patient Care Conference (Signed)
Inpatient RehabilitationTeam Conference and Plan of Care Update Date: 11/24/2015   Time: 10:57 AM    Patient Name: [name]      Medical Record Number: [id number]  Date of Birth: 1931/04/10 Sex: Male         Room/Bed: 4W01C/4W01C-01 Payor Info: Payor: MEDICARE / Plan: MEDICARE PART A AND B / Product Type: *No Product type* /    Admitting Diagnosis: [place]- Debility  Admit Date/Time:  11/18/2015  3:52 PM Admission Comments: No comment available   Primary Diagnosis:  <principal problem not specified> Principal Problem: <principal problem not specified>  Patient Active Problem List   Diagnosis Date Noted  . S/P cardiac pacemaker procedure   . Debilitated 11/18/2015  . Neuropathic pain   . Acute on chronic combined systolic and diastolic heart failure ([place])   . Tachycardia   . Pacemaker   . DM type 2 with diabetic peripheral neuropathy ([place])   . Benign essential HTN   . History of syncope   . LBBB (left bundle branch block)   . Tachypnea   . Acute blood loss anemia   . NSVT (nonsustained ventricular tachycardia) ([place])   . Bradycardia, severe sinus 11/13/2015  . Complete heart block ([place])   . Chronic combined systolic and diastolic heart failure ([place]) 04/27/2013  . Essential hypertension 04/27/2013  . Left bundle branch block 04/27/2013  . Type II or unspecified type diabetes mellitus with neurological manifestations, uncontrolled 01/15/2013    Expected Discharge Date: Expected Discharge Date: 11/25/15  Team Members Present: Physician leading conference: Dr. Delice Lesch Social Worker Present: Ovidio Kin, LCSW Nurse Present: Dorien Chihuahua, RN PT Present: Barrie Folk, PT OT Present: Meriel Pica, OT PPS Coordinator present : Daiva Nakayama, RN, CRRN     Current Status/Progress Goal Weekly Team Focus  Medical   Debilitation secondary to complete heart block status post pacemaker/multi-medical  Improve mobility, transfers, safety  See above   Bowel/Bladder   condom cath at night due to urgency-trying now depends and urianl   continence B & B urgency   depends with urinal  Swallow/Nutrition/ Hydration        na     ADL's     supervision with sequencing   supervision with cueing for B & D     Mobility     supervision due needs cues at times for safety   supervision/mod/i level     Communication             Safety/Cognition/ Behavioral Observations            Pain        less than 3-managed     Skin        no issues        *See Care Plan and progress notes for long and short-term goals.  Barriers to Discharge: DM, ABLA, safety, mobility, transfers    Possible Resolutions to Barriers:  Optimize meds, therapies    Discharge Planning/Teaching Needs:    Home with daughter who can provide supervision level. Has been in for teaching     Team Discussion:  Goals supervision/mod/i level still requiring cueing. Medically stable for DC. Daughter to provide supervision at home  Revisions to Treatment Plan:  DC 7/18   Continued Need for Acute Rehabilitation Level of Care: The patient requires daily medical management by a physician with specialized training in physical medicine and rehabilitation for the following conditions: Daily direction of a multidisciplinary physical rehabilitation program to ensure safe treatment  while eliciting the highest outcome that is of practical value to the patient.: Yes Daily medical management of patient stability for increased activity during participation in an intensive rehabilitation regime.: Yes Daily analysis of laboratory values and/or radiology reports with any subsequent need for medication adjustment of medical intervention for : Cardiac problems;Post surgical problems  Jame Seelig, Gardiner Rhyme 11/24/2015, 10:57 AM

## 2015-11-24 NOTE — Discharge Instructions (Signed)
Inpatient Rehab Discharge Instructions  [name] Discharge date and time: No discharge date for patient encounter.   Activities/Precautions/ Functional Status: Activity: activity as tolerated Diet: diabetic diet Wound Care: none needed Functional status:  ___ No restrictions     ___ Walk up steps independently ___ 24/7 supervision/assistance   ___ Walk up steps with assistance ___ Intermittent supervision/assistance  ___ Bathe/dress independently ___ Walk with walker     _x__ Bathe/dress with assistance ___ Walk Independently    ___ Shower independently ___ Walk with assistance    ___ Shower with assistance ___ No alcohol     ___ Return to work/school ________  Special Instructions:    COMMUNITY REFERRALS UPON DISCHARGE:    Home Health:   PT, OT, RN    [facility]   Date of last service:11/25/2015   Medical Equipment/Items [place]  [phone]     My questions have been answered and I understand these instructions. I will adhere to these goals and the provided educational materials after my discharge from the hospital.  Patient/Caregiver Signature _______________________________ Date __________  Clinician Signature _______________________________________ Date __________  Please bring this form and your medication list with you to all your follow-up doctor's appointments.

## 2015-11-24 NOTE — Discharge Summary (Signed)
[name], [name]                 ACCOUNT NO.:  [id number]  MEDICAL RECORD NO.:  [id number]  LOCATION:  [id number]                        FACILITY:  [facility]  PHYSICIAN:  Delice Lesch, MD        DATE OF BIRTH:  Nov 20, 1930  DATE OF ADMISSION:  11/18/2015 DATE OF DISCHARGE:  11/25/2015                              DISCHARGE SUMMARY   DISCHARGE DIAGNOSES: 1. Debilitation secondary to complete heart block, status post     pacemaker. 2. SCDs for deep venous thrombosis prophylaxis. 3. Hypertension. 4. Chronic combined systolic and diastolic heart failure. 5. Diabetes mellitus, peripheral neuropathy. 6. Left bundle-branch block. 7. Tachypnea, resolved.  HISTORY OF PRESENT ILLNESS:  This is an 82 year old right-handed male with history of diabetes mellitus, hypertension, remote syncope, chronic left-bundle branch block, chronic combined systolic and diastolic congestive heart failure.  Lives with daughter, assistance as needed independent with assistive device and driving short distances prior to admission.  Presented on November 13, 2015, with dizziness, question of syncope, troponin negative.  He was found to be in complete heart block. Follow up Cardiology Services.  Echocardiogram with ejection fraction of 45%.  Systolic function was mildly-to-moderately reduced.  Underwent placement of permanent pacemaker on November 14, 2015.  Physical and occupational therapy ongoing.  The patient was admitted for comprehensive rehab program.  PAST MEDICAL HISTORY:  See discharge diagnoses.  SOCIAL HISTORY:  Lives with children, independent with assistive device prior to admission.  FUNCTIONAL STATUS:  Upon admission to [place] was moderate assist, 3 feet with a quad cane; minimal assist supine-to-sit; min-to- mod assist activities of daily living.  PHYSICAL EXAMINATION:  VITAL SIGNS:  Blood pressure 121/71, pulse 72, temperature 98, respirations 16. GENERAL:  This was an alert male, in no acute  distress. LUNGS:  Clear to auscultation.  No wheeze. CARDIAC:  Rate irregularly irregular. ABDOMEN:  Soft, nontender.  Good bowel sounds.  Pacemaker site clean and dry.  REHABILITATION HOSPITAL COURSE:  The patient was admitted to Inpatient Rehab Services with therapies initiated on a 3-hour daily basis, consisting of physical therapy, occupational therapy and rehabilitation nursing.  The following issues were addressed during the patient's rehabilitation stay.  Pertaining to Mr. Masih debilitation secondary to complete heart block, he had undergone pacemaker, no chest pain or shortness of breath.  He would follow up with Cardiology Services.  SCDs for DVT prophylaxis.  Pain management with the use of Neurontin 300 mg 3 times daily.  Blood pressure was controlled, managed with Lopressor as well as Avapro.  He showed no signs of fluid overload.  He remained on low-dose diuretic.  The patient received weekly collaborative interdisciplinary team conferences to discuss estimated length of stay, family teaching, any barriers to discharge.  He was ambulating 150 feet supervision up and down 12 stairs with hand rails and supervision up and down curbs and ramps with rolling walker, minimal guard.  He was able to gather his belongings for activities of daily living and homemaking, using rolling walker.  Transferred to tub bench shower with supervision, completed upper lower body bathing with supervision, full family teaching completed, plan discharge to home.  The patient was advised, no driving.  DISCHARGE MEDICATIONS: 1. Aspirin 81 mg p.o. daily. 2. Lasix 20 mg p.o. daily. 3. Neurontin 300 mg p.o. t.i.d. 4. Lantus insulin 20 units subcutaneous at bedtime. 5. Avapro 75 mg daily. 6. Lopressor 25 mg p.o. b.i.d. 7. Senokot two tablets at bedtime, hold for loose stools. 8.Lipitor 40 mg daily   DIET:  Diabetic diet.  FOLLOWUP:  He would follow up with Dr. Posey Pronto, the Outpatient  Rehab Service office as needed; Dr. Dwyane Dee, medical management, December 22, 2015; Dr. Daneen Schick; Dr. Virl Axe, Cardiology Services, call for appointment.     Lauraine Rinne, P.A.   ______________________________ Delice Lesch, MD    DA/MEDQ  D:  11/24/2015  T:  11/24/2015  Job:  [id number]  cc:   Belva Crome, M.D. Elayne Snare, M.D. Deboraha Sprang, MD, [facility]

## 2015-11-24 NOTE — Progress Notes (Signed)
Social Work Patient ID: [name], male   DOB: 17-Nov-1930, 82 y.o.   MRN: [id number] Still attempting to get daughter in here for family education, pt unsure if planning to come in today but aware of discharge tomorrow. Will make arrangements for tomorrow.

## 2015-11-24 NOTE — Progress Notes (Signed)
Social Work  Discharge Note  The overall goal for the admission was met for:   Discharge location: [place]  Length of Stay: Yes-7 DAYS  Discharge activity level: Yes-SUPERVISION DUE TO NEEDING CUES  Home/community participation: Yes  Services provided included: MD, RD, PT, OT, RN, CM, Pharmacy and [place]: Medicare and Private Insurance: [facility]  Follow-up services arranged: Home Health: [place] and Patient/Family has no preference for HH/DME agencies  Pt now needs  A rolling walker-referral made to [facility]-delivered to room  Comments (or additional information):DAUGHTER NEVER CAME IN FOR EDUCATION MULTIPLE ATTEMPTS MADE TO GET HER IN WITHOUT SUCCESS. PT WILL REQUIRE SUPERVISION LEVEL WHICH HE REPORTS SHE WILL DO SINCE SHE DOES NOT WORK AND LIVES WITH HIM.   Patient/Family verbalized understanding of follow-up arrangements: Yes  Individual responsible for coordination of the follow-up plan: SELF & LYNETTE-DAUGHTER  Confirmed correct DME delivered: Elease Hashimoto 11/24/2015    Elease Hashimoto

## 2015-11-24 NOTE — Progress Notes (Signed)
Physical Therapy Discharge Summary  Patient Details  Name: [name] MRN: [id number] Date of Birth: 1931-01-12  Today's Date: 11/24/2015 PT Individual Time: 6269-4854 PT Individual Time Calculation (min): 56 min    Patient has met 7 of 10 long term goals due to improved activity tolerance, improved balance, improved postural control, increased strength, ability to compensate for deficits and improved awareness.  Patient to discharge at an ambulatory level Supervision.   Patient's caregiver was asked & encouraged to attend therapy sessions for family education but was not present during any PT sessions.   Reasons goals not met: decreased safety awareness  Recommendation:  Patient will benefit from ongoing skilled PT services in home health setting to continue to advance safe functional mobility, address ongoing impairments in decreased dynamic balance, decreased endurance, decreased safety awareness, and minimize fall risk.  Equipment: RW, HHPT  Reasons for discharge: treatment goals met  Patient/family agrees with progress made and goals achieved: Yes  Skilled PT treatment: Pt received in recliner & agreeable to PT, denying c/o pain. Manual testing completed, please see below for details. Pt able to complete sit<>stand transfers & car transfers with supervision on this date. Pt continues to require cuing for hand placement with stand>sit transfers. Pt is able to ambulate with supervision & RW as he continues to require cuing for safety & recall. Pt is AxO x 4 but was unable recall working with this therapist a few days ago, and unable to recall DME he has at home. Encouraged pt to have daughter come in for last session today with OT & pt voiced understanding. PT educated pt to remove all throw rugs & obstacles from his path to reduce tripping hazards & pt voiced understanding. Pt completed stair negotiation x 12 steps (6" + 3") with B rails & supervision. Pt completed Berg Balance Test;  Patient demonstrates increased fall risk as noted by score of 43/56 on Berg Balance Scale.  (<36= high risk for falls, close to 100%; 37-45 significant >80%; 46-51 moderate >50%; 52-55 lower >25%). Educated pt on improvement of score from 36/56 to 43/56 but his continued need to use RW with all functional mobility and his continued fall risk. Discussed HHPT with pt and d/c plans, as this PT recommends that he d/c home with supervision. At end of session pt left sitting in recliner set up with lunch tray & all needs within reach.    PT Discharge Precautions/Restrictions Precautions Precaution Comments: pacemaker Restrictions Weight Bearing Restrictions: No  Pain Pain Assessment Pain Assessment: No/denies pain Pain Score: 0-No pain  Vision/Perception  Per pt report he does not wear glasses & reports no changes in vision since admission to CIR.  Cognition Orientation Level: Oriented to person;Oriented to place;Oriented to time (cannot recall reason for admission, only "pacemaker"; requries extra time to recaall year/month/date) Awareness: Impaired Safety/Judgment: Impaired  Sensation Sensation Light Touch: Appears Intact (BLE) Proprioception: Impaired Detail (impaired L great toe; all others intact) Coordination Gross Motor Movements are Fluid and Coordinated: Yes  Mobility Bed Mobility Bed Mobility: Rolling Right;Rolling Left;Supine to Sit;Sit to Supine (regular bed) Rolling Right: 6: Modified independent (Device/Increase time) Rolling Left: 6: Modified independent (Device/Increase time) Supine to Sit: 6: Modified independent (Device/Increase time) Sit to Supine: 6: Modified independent (Device/Increase time) Transfers Transfers: Yes Sit to Stand: 5: Supervision (cuing for hand placement) Stand to Sit: 5: Supervision (cuing for hand placement) Stand Pivot Transfers: 5: Supervision (with RW; cuing for hand placement)   Locomotion  Ambulation Ambulation: Yes Ambulation/Gait  Assistance: 5: Supervision;6: Modified independent (Device/Increase time) Ambulation Distance (Feet): 200 Feet Assistive device: Rolling walker Gait Gait: Yes Gait Pattern: Decreased step length - right;Decreased step length - left (decreased gait speed) Stairs / Additional Locomotion Stairs: Yes Stairs Assistance: 5: Supervision Stair Management Technique: Two rails Number of Stairs: 12 Height of Stairs:  (6 inches & 3 inches) Ramp: 6: Modified independent (Device) (with RW) Wheelchair Mobility Wheelchair Mobility: No   Balance Balance Balance Assessed: Yes Standardized Balance Assessment Standardized Balance Assessment: Berg Balance Test Berg Balance Test Sit to Stand: Able to stand without using hands and stabilize independently Standing Unsupported: Able to stand safely 2 minutes Sitting with Back Unsupported but Feet Supported on Floor or Stool: Able to sit safely and securely 2 minutes Stand to Sit: Sits safely with minimal use of hands Transfers: Able to transfer safely, minor use of hands Standing Unsupported with Eyes Closed: Able to stand 10 seconds with supervision Standing Ubsupported with Feet Together: Able to place feet together independently and stand 1 minute safely From Standing, Reach Forward with Outstretched Arm: Can reach forward >12 cm safely (5") From Standing Position, Pick up Object from Floor: Able to pick up shoe, needs supervision From Standing Position, Turn to Look Behind Over each Shoulder: Needs supervision when turning (only turns head, does not shift weight) Turn 360 Degrees: Able to turn 360 degrees safely one side only in 4 seconds or less (8 seconds to each side) Standing Unsupported, Alternately Place Feet on Step/Stool: Able to complete 4 steps without aid or supervision (completes 8 steps in 15 seconds but requires supervision for entire task) Standing Unsupported, One Foot in Front: Able to plae foot ahead of the other independently and hold  30 seconds Standing on One Leg: Tries to lift leg/unable to hold 3 seconds but remains standing independently Total Score: 43   Extremity Assessment  RLE Assessment RLE Assessment: Exceptions to [facility] RLE AROM (degrees) Overall AROM Right Lower Extremity: Within functional limits for tasks assessed RLE Strength Right Hip Flexion: 4-/5 Right Knee Flexion: 4/5 Right Knee Extension: 4-/5 Right Ankle Dorsiflexion: 4-/5 LLE Assessment LLE Assessment: Exceptions to WFL LLE AROM (degrees) Overall AROM Left Lower Extremity: Within functional limits for tasks assessed LLE Strength Left Hip Flexion: 4-/5 Left Knee Flexion: 4+/5 Left Knee Extension: 4+/5 Left Ankle Dorsiflexion: 4/5   See Function Navigator for Current Functional Status.  Waunita Schooner 11/24/2015, 11:12 AM

## 2015-11-24 NOTE — Plan of Care (Signed)
Problem: RH Bed to Chair Transfers Goal: LTG Patient will perform bed/chair transfers w/assist (PT) LTG: Patient will perform bed/chair transfers with assistance, with/without cues (PT).  Outcome: Not Met (add Reason) With RW; cuing for safety   Problem: RH Car Transfers Goal: LTG Patient will perform car transfers with assist (PT) LTG: Patient will perform car transfers with assistance (PT).  Outcome: Completed/Met Date Met:  11/24/15 With RW  Problem: RH Floor Transfers Goal: LTG Patient will perform floor transfers w/assist (PT) LTG: Patient will perform floor transfers with assistance (PT).  Outcome: Not Met (add Reason) Requires physical assistance & cuing for safety  Problem: RH Ambulation Goal: LTG Patient will ambulate in controlled environment (PT) LTG: Patient will ambulate in a controlled environment, # of feet with assistance (PT).  Outcome: Not Met (add Reason) 200 ft RW; requires cuing for sfaety Goal: LTG Patient will ambulate in home environment (PT) LTG: Patient will ambulate in home environment, # of feet with assistance (PT).  Outcome: Not Met (add Reason) 50 ft with RW; requires supervision for safety Goal: LTG Patient will ambulate in community environment (PT) LTG: Patient will ambulate in community environment, # of feet with assistance (PT).  Outcome: Completed/Met Date Met:  11/24/15 200 ft with RW  Problem: RH Stairs Goal: LTG Patient will ambulate up and down stairs w/assist (PT) LTG: Patient will ambulate up and down # of stairs with assistance (PT)  Outcome: Completed/Met Date Met:  11/24/15 12 steps with B rails     

## 2015-11-24 NOTE — Progress Notes (Signed)
Social Work Patient ID: [name], male   DOB: Aug 12, 1930, 82 y.o.   MRN: [id number] Met with pt and Victoria-PT to discuss if he does or does not have a rolling walker at home. Have gone ahead and made referral to [facility] for rolling walker to be delivered to room prior to discharge tomorrow.

## 2015-11-24 NOTE — Progress Notes (Signed)
[place] PHYSICAL MEDICINE & REHABILITATION     PROGRESS NOTE  Subjective/Complaints:  Pt laying in bed this AM.  He had a "great weekend" and is pleased with his progress.   ROS: Denies CP, SOB, N/V/D.  Objective: Vital Signs: Blood pressure 132/77, pulse 77, temperature 98 F (36.7 C), temperature source Oral, resp. rate 18, height 5\' 9"  (1.753 m), weight 82.781 kg (182 lb 8 oz), SpO2 100 %. No results found. No results for input(s): WBC, HGB, HCT, PLT in the last 72 hours. No results for input(s): NA, K, CL, GLUCOSE, BUN, CREATININE, CALCIUM in the last 72 hours.  Invalid input(s): CO CBG (last 3)   Recent Labs  11/23/15 1622 11/23/15 2051 11/24/15 0637  GLUCAP 102* 161* 105*    Wt Readings from Last 3 Encounters:  11/24/15 82.781 kg (182 lb 8 oz)  11/13/15 85.5 kg (188 lb 7.9 oz)  10/21/15 87.091 kg (192 lb)    Physical Exam:  BP 132/77 mmHg  Pulse 77  Temp(Src) 98 F (36.7 C) (Oral)  Resp 18  Ht 5\' 9"  (1.753 m)  Wt 82.781 kg (182 lb 8 oz)  BMI 26.94 kg/m2  SpO2 100% Constitutional: He appears well-developed and well-nourished. NAD HENT: Normocephalic and atraumatic.  Eyes: Conjunctivae and EOM are normal.  Cardiovascular:  Cardiac rate control  Respiratory: Effort normal and breath sounds normal. No respiratory distress.  GI: Soft. Bowel sounds are normal. He exhibits no distension.  Musculoskeletal: He exhibits no edema or tenderness.  Neurological: He is alert and oriented to person, place, and time.  Motor: 4+/5 throughout Skin: Skin is warm and dry.  Pacemaker site c/d/i  Psychiatric: He has a normal mood and affect. His behavior is normal. Thought content normal  Assessment/Plan: 1. Functional deficits secondary to complete heart block status post pacemaker/multi-medical which require 3+ hours per day of interdisciplinary therapy in a comprehensive inpatient rehab setting. Physiatrist is providing close team supervision and 24 hour  management of active medical problems listed below. Physiatrist and rehab team continue to assess barriers to discharge/monitor patient progress toward functional and medical goals.  Function:  Bathing Bathing position   Position: Shower  Bathing parts Body parts bathed by patient: Right arm, Left arm, Chest, Abdomen, Front perineal area, Buttocks, Right upper leg, Left upper leg, Right lower leg, Left lower leg, Back Body parts bathed by helper: Back  Bathing assist Assist Level: Touching or steadying assistance(Pt > 75%)      Upper Body Dressing/Undressing Upper body dressing   What is the patient wearing?: Pull over shirt/dress     Pull over shirt/dress - Perfomed by patient: Thread/unthread right sleeve, Thread/unthread left sleeve, Put head through opening, Pull shirt over trunk   Button up shirt - Perfomed by patient: Thread/unthread right sleeve, Thread/unthread left sleeve, Pull shirt around back, Button/unbutton shirt      Upper body assist Assist Level: Supervision or verbal cues      Lower Body Dressing/Undressing Lower body dressing   What is the patient wearing?: Pants, Non-skid slipper socks, Socks, Shoes, Underwear Underwear - Performed by patient: Thread/unthread right underwear leg, Thread/unthread left underwear leg, Pull underwear up/down   Pants- Performed by patient: Thread/unthread right pants leg, Thread/unthread left pants leg, Pull pants up/down, Fasten/unfasten pants   Non-skid slipper socks- Performed by patient: Don/doff right sock, Don/doff left sock   Socks - Performed by patient: Don/doff right sock, Don/doff left sock   Shoes - Performed by patient: Don/doff right shoe, Don/doff left  shoe, Fasten right, Fasten left Shoes - Performed by helper: Don/doff right shoe, Fasten right          Lower body assist Assist for lower body dressing: Touching or steadying assistance (Pt > 75%) (for threading catheter only)      Toileting Toileting    Toileting steps completed by patient: Adjust clothing prior to toileting, Performs perineal hygiene, Adjust clothing after toileting   Toileting Assistive Devices: Grab bar or rail  Toileting assist Assist level: Touching or steadying assistance (Pt.75%)   Transfers Chair/bed transfer   Chair/bed transfer method: Ambulatory Chair/bed transfer assist level: Supervision or verbal cues Chair/bed transfer assistive device: Armrests, Medical sales representative     Max distance: 220 Assist level: No help, No cues, assistive device, takes more than a reasonable amount of time   Wheelchair Wheelchair activity did not occur: N/A        Cognition Comprehension Comprehension assist level: Understands basic 90% of the time/cues < 10% of the time  Expression Expression assist level: Expresses basic 90% of the time/requires cueing < 10% of the time.  Social Interaction Social Interaction assist level: Interacts appropriately with others - No medications needed.  Problem Solving Problem solving assist level: Solves basic 90% of the time/requires cueing < 10% of the time  Memory Memory assist level: Recognizes or recalls 90% of the time/requires cueing < 10% of the time     Medical Problem List and Plan: 1. Debilitation secondary to complete heart block status post pacemaker/multi-medical  Cont CIR, pt making progress with therapies 2. DVT Prophylaxis/Anticoagulation: SCDs. Monitor for any signs of DVT 3. Pain Management: Neurontin 300 mg 3 times a day 4. Hypertension. Avapro 75 mg daily, Lopressor 25 mg twice a day.   Monitor with increased mobility 5. Neuropsych: This patient is capable of making decisions on his own behalf. 6. Skin/Wound Care: Routine skin checks 7. Fluids/Electrolytes/Nutrition: Routine I&O's   BMP within acceptable range on 7/12 8. Chronic combined systolic and diastolic heart failure. Lasix 20 mg daily. Monitor for any signs of fluid overload Filed Weights    11/22/15 0429 11/23/15 0426 11/24/15 0517  Weight: 83.054 kg (183 lb 1.6 oz) 84.5 kg (186 lb 4.6 oz) 82.781 kg (182 lb 8 oz)   9. Diabetes mellitus with peripheral neuropathy. Hemoglobin A1c 7.3.   Lantus insulin 20 units daily at bedtime.    Check blood sugars before meals and at bedtime.   Diabetic teaching  Will cont to monitor  Improved control 10. LBBB: Monitor 11. ABLA:   Hb 12.4 on 7/12  Cont to monitor 12. Tachycardia:   Resolved  Likely due to deconditioning. Cont to monitor.  13. Tachpnea:   Resolved  Likely due to deconditioning. Cont to monitor.   LOS (Days) 6 A FACE TO FACE EVALUATION WAS PERFORMED  Zhyon Antenucci Lorie Phenix 11/24/2015 9:22 AM

## 2015-11-25 LAB — GLUCOSE, CAPILLARY: GLUCOSE-CAPILLARY: 71 mg/dL (ref 65–99)

## 2015-11-25 MED ORDER — INSULIN GLARGINE 100 UNIT/ML ~~LOC~~ SOLN: 20.0000 [IU] | Freq: Every day | SUBCUTANEOUS | Status: DC

## 2015-11-25 MED ORDER — GABAPENTIN 300 MG PO CAPS: ORAL_CAPSULE | ORAL | Status: DC

## 2015-11-25 MED ORDER — INSULIN GLARGINE 100 UNIT/ML ~~LOC~~ SOLN: 30.0000 [IU] | Freq: Every evening | SUBCUTANEOUS | Status: DC

## 2015-11-25 MED ORDER — ATORVASTATIN CALCIUM 40 MG PO TABS: ORAL_TABLET | ORAL | Status: DC

## 2015-11-25 MED ORDER — FUROSEMIDE 20 MG PO TABS: 20.0000 mg | ORAL_TABLET | Freq: Every day | ORAL | Status: DC

## 2015-11-25 MED ORDER — IRBESARTAN 75 MG PO TABS: 75.0000 mg | ORAL_TABLET | Freq: Every day | ORAL | Status: DC

## 2015-11-25 MED ORDER — METOPROLOL TARTRATE 25 MG PO TABS: 25.0000 mg | ORAL_TABLET | Freq: Two times a day (BID) | ORAL | Status: DC

## 2015-11-25 NOTE — Discharge Summary (Deleted)
[name], [name]                 ACCOUNT NO.:  [id number]  MEDICAL RECORD NO.:  [id number]  LOCATION:  [id number]                        FACILITY:  [facility]  PHYSICIAN:  Delice Lesch, MD        DATE OF BIRTH:  15-Jan-1931  DATE OF ADMISSION:  11/18/2015 DATE OF DISCHARGE:  11/25/2015                              DISCHARGE SUMMARY   DISCHARGE DIAGNOSES: 1. Debilitation secondary to complete heart block, status post     pacemaker. 2. SCDs for deep venous thrombosis prophylaxis. 3. Hypertension. 4. Pain management. 5. Chronic combined systolic and diastolic congestive heart failure. 6. Acute blood loss anemia.  HISTORY OF PRESENT ILLNESS:  This is an 82 year old right-handed male with history of diabetes mellitus, hypertension, remote syncope, chronic left bundle-branch block, congestive heart failure.  Lives with daughter.  Independent with assistive device prior to admission. Presented on November 13, 2015, with dizziness, question of syncope, troponin negative.  Found to be in complete heart block.  Echocardiogram with ejection fraction of 45%.  Underwent placement of permanent pacemaker on November 14, 2015.  Physical and occupational therapy ongoing.  The patient was admitted for comprehensive rehab program.  PAST MEDICAL HISTORY:  See discharge diagnoses.  SOCIAL HISTORY:  Lives with daughter, independent with a quad cane prior to admission.  FUNCTIONAL STATUS:  Upon admission to [place], moderate assist 3- feet quad cane; moderate assist sit-to-stand; min-to-mod assist activities of daily living.  PHYSICAL EXAMINATION:  VITAL SIGNS:  Blood pressure 121/71, pulse 72, temperature 98, respirations 16. GENERAL:  This was an alert male, in no acute distress, oriented x3. LUNGS:  Clear to auscultation without wheeze. CARDIAC:  Regular rate and rhythm.  No murmur. ABDOMEN:  Soft, nontender.  Good bowel sounds.  Pacemaker site clean and dry.  REHABILITATION HOSPITAL COURSE:  The  patient was admitted to Inpatient Rehab Services.  Therapies initiated on a 3-hour daily basis, consisting of physical therapy, occupational therapy and rehabilitation nursing. Following issues were addressed during the patient's rehabilitation stay.  Pertaining to [name] debilitation related to heart block, he had undergone pacemaker.  He would follow up with Cardiology Services. No chest pain or shortness of breath.  Blood pressure was controlled on Avapro and Lopressor.  He exhibited no signs of fluid overload, remained on low-dose Lasix.  Diabetes mellitus, peripheral neuropathy. Hemoglobin A1c of 7.3.  He remained on insulin therapy.  Diabetic teaching completed.  The patient received weekly collaborative interdisciplinary team conferences to discuss estimated length of stay, family teaching, any barriers to discharge.  He was ambulating supervision with a rolling walker.  Alert and oriented x4 throughout the sessions, negotiating stairs with bilateral hand rails with supervision. Completed Berg testing, scored 43/56.  He could gather his belongings for activities of daily living and homemaking, dressing, grooming and hygiene.  Ambulating with rolling walker with supervision for collecting all necessary items.  Full family teaching was completed and plan discharge to home.  DISCHARGE MEDICATIONS: 1. Aspirin 81 mg p.o. daily. 2. Lasix 20 mg p.o. daily. 3. Neurontin 300 mg p.o. t.i.d. 4. Lantus insulin 20 units subcutaneous daily. 5. Avapro 75 mg p.o. daily. 6. Lopressor 25 mg p.o.  b.i.d.  DIET:  Diabetic diet.  FOLLOWUP:  He would follow up with Dr. Posey Pronto at the Outpatient Rehab Service office as needed; Dr. Virl Axe, call for appointment; Dr. Daneen Schick, primary cardiology, call for appointment; Dr. Dwyane Dee.     Lauraine Rinne, P.A.   ______________________________ Delice Lesch, MD    DA/MEDQ  D:  11/24/2015  T:  11/25/2015  Job:  [id number]  cc:   Elayne Snare,  M.D. Deboraha Sprang, MD, [facility] Belva Crome, M.D.

## 2015-11-25 NOTE — Progress Notes (Signed)
Patient discharged to home with family.

## 2015-11-25 NOTE — Progress Notes (Signed)
[facility] PHYSICAL MEDICINE & REHABILITATION     PROGRESS NOTE  Subjective/Complaints:  Pt laying in bed.  He is ready for discharge.   ROS: Denies CP, SOB, N/V/D.  Objective: Vital Signs: Blood pressure 131/77, pulse 70, temperature 97.5 F (36.4 C), temperature source Oral, resp. rate 18, height 5\' 9"  (1.753 m), weight 82.101 kg (181 lb), SpO2 100 %. No results found. No results for input(s): WBC, HGB, HCT, PLT in the last 72 hours. No results for input(s): NA, K, CL, GLUCOSE, BUN, CREATININE, CALCIUM in the last 72 hours.  Invalid input(s): CO CBG (last 3)   Recent Labs  11/24/15 1654 11/24/15 2023 11/25/15 0621  GLUCAP 151* 130* 71    Wt Readings from Last 3 Encounters:  11/25/15 82.101 kg (181 lb)  11/13/15 85.5 kg (188 lb 7.9 oz)  10/21/15 87.091 kg (192 lb)    Physical Exam:  BP 131/77 mmHg  Pulse 70  Temp(Src) 97.5 F (36.4 C) (Oral)  Resp 18  Ht 5\' 9"  (1.753 m)  Wt 82.101 kg (181 lb)  BMI 26.72 kg/m2  SpO2 100% Constitutional: He appears well-developed and well-nourished. NAD HENT: Normocephalic and atraumatic.  Eyes: Conjunctivae and EOM are normal.  Cardiovascular:  Cardiac rate control  Respiratory: Effort normal and breath sounds normal. No respiratory distress.  GI: Soft. Bowel sounds are normal. He exhibits no distension.  Musculoskeletal: He exhibits no edema or tenderness.  Neurological: He is alert and oriented to person, place, and time.  Motor: 4+/5 throughout Skin: Skin is warm and dry.  Pacemaker site c/d/i  Psychiatric: He has a normal mood and affect. His behavior is normal. Thought content normal  Assessment/Plan: 1. Functional deficits secondary to complete heart block status post pacemaker/multi-medical which require 3+ hours per day of interdisciplinary therapy in a comprehensive inpatient rehab setting. Physiatrist is providing close team supervision and 24 hour management of active medical problems listed  below. Physiatrist and rehab team continue to assess barriers to discharge/monitor patient progress toward functional and medical goals.  Function:  Bathing Bathing position   Position: Wheelchair/chair at sink  Bathing parts Body parts bathed by patient: Right arm, Left arm, Chest, Abdomen, Front perineal area, Buttocks, Right upper leg, Left upper leg, Right lower leg, Left lower leg, Back Body parts bathed by helper: Back  Bathing assist Assist Level: Touching or steadying assistance(Pt > 75%)      Upper Body Dressing/Undressing Upper body dressing   What is the patient wearing?: Pull over shirt/dress     Pull over shirt/dress - Perfomed by patient: Thread/unthread right sleeve, Thread/unthread left sleeve, Put head through opening, Pull shirt over trunk   Button up shirt - Perfomed by patient: Thread/unthread right sleeve, Thread/unthread left sleeve, Pull shirt around back, Button/unbutton shirt      Upper body assist Assist Level: More than reasonable time      Lower Body Dressing/Undressing Lower body dressing   What is the patient wearing?: Underwear, Pants, Non-skid slipper socks, Shoes Underwear - Performed by patient: Thread/unthread right underwear leg, Thread/unthread left underwear leg, Pull underwear up/down   Pants- Performed by patient: Thread/unthread right pants leg, Thread/unthread left pants leg, Pull pants up/down, Fasten/unfasten pants   Non-skid slipper socks- Performed by patient: Don/doff right sock, Don/doff left sock   Socks - Performed by patient: Don/doff right sock, Don/doff left sock   Shoes - Performed by patient: Don/doff right shoe, Don/doff left shoe, Fasten right, Fasten left Shoes - Performed by helper: Don/doff right shoe,  Fasten right          Lower body assist Assist for lower body dressing: Supervision or verbal cues      Toileting Toileting   Toileting steps completed by patient: Adjust clothing prior to toileting, Performs  perineal hygiene, Adjust clothing after toileting Toileting steps completed by helper: Performs perineal hygiene, Adjust clothing after toileting (per Courtney Paris, NT) Toileting Assistive Devices: Grab bar or rail  Toileting assist Assist level: Touching or steadying assistance (Pt.75%)   Transfers Chair/bed transfer   Chair/bed transfer method: Ambulatory Chair/bed transfer assist level: Supervision or verbal cues Chair/bed transfer assistive device: Medical sales representative     Max distance: 200 ft Assist level: Supervision or verbal cues   Wheelchair Wheelchair activity did not occur: N/A        Cognition Comprehension Comprehension assist level: Understands basic 75 - 89% of the time/ requires cueing 10 - 24% of the time  Expression Expression assist level: Expresses basic 90% of the time/requires cueing < 10% of the time.  Social Interaction Social Interaction assist level: Interacts appropriately with others - No medications needed.  Problem Solving Problem solving assist level: Solves basic 90% of the time/requires cueing < 10% of the time  Memory Memory assist level: Recognizes or recalls 75 - 89% of the time/requires cueing 10 - 24% of the time     Medical Problem List and Plan: 1. Debilitation secondary to complete heart block status post pacemaker/multi-medical  D/c today 2. DVT Prophylaxis/Anticoagulation: SCDs. Monitor for any signs of DVT 3. Pain Management: Neurontin 300 mg 3 times a day 4. Hypertension. Avapro 75 mg daily, Lopressor 25 mg twice a day.   Monitor with increased mobility 5. Neuropsych: This patient is capable of making decisions on his own behalf. 6. Skin/Wound Care: Routine skin checks 7. Fluids/Electrolytes/Nutrition: Routine I&O's   BMP within acceptable range on 7/12 8. Chronic combined systolic and diastolic heart failure. Lasix 20 mg daily. Monitor for any signs of fluid overload Filed Weights   11/23/15 0426 11/24/15 0517  11/25/15 0558  Weight: 84.5 kg (186 lb 4.6 oz) 82.781 kg (182 lb 8 oz) 82.101 kg (181 lb)   9. Diabetes mellitus with peripheral neuropathy. Hemoglobin A1c 7.3.   Lantus insulin 20 units daily at bedtime.    Check blood sugars before meals and at bedtime.   Diabetic teaching  Will cont to monitor  Improved control 10. LBBB: Monitor 11. ABLA:   Hb 12.4 on 7/12  Cont to monitor 12. Tachycardia:   Resolved  Likely due to deconditioning. Cont to monitor.  13. Tachpnea:   Resolved  Likely due to deconditioning. Cont to monitor.   LOS (Days) 7 A FACE TO FACE EVALUATION WAS PERFORMED  Ankit Lorie Phenix 11/25/2015 9:00 AM

## 2015-11-26 ENCOUNTER — Encounter (INDEPENDENT_AMBULATORY_CARE_PROVIDER_SITE_OTHER): Payer: Medicare Other | Admitting: Podiatry

## 2015-11-26 DIAGNOSIS — E1140 Type 2 diabetes mellitus with diabetic neuropathy, unspecified: Secondary | ICD-10-CM | POA: Diagnosis not present

## 2015-11-26 DIAGNOSIS — I5042 Chronic combined systolic (congestive) and diastolic (congestive) heart failure: Secondary | ICD-10-CM | POA: Diagnosis not present

## 2015-11-26 DIAGNOSIS — Z8701 Personal history of pneumonia (recurrent): Secondary | ICD-10-CM | POA: Diagnosis not present

## 2015-11-26 DIAGNOSIS — Z794 Long term (current) use of insulin: Secondary | ICD-10-CM | POA: Diagnosis not present

## 2015-11-26 DIAGNOSIS — Z48812 Encounter for surgical aftercare following surgery on the circulatory system: Secondary | ICD-10-CM | POA: Diagnosis not present

## 2015-11-26 DIAGNOSIS — I110 Hypertensive heart disease with heart failure: Secondary | ICD-10-CM | POA: Diagnosis not present

## 2015-11-26 DIAGNOSIS — E785 Hyperlipidemia, unspecified: Secondary | ICD-10-CM | POA: Diagnosis not present

## 2015-11-26 DIAGNOSIS — D62 Acute posthemorrhagic anemia: Secondary | ICD-10-CM | POA: Diagnosis not present

## 2015-11-26 DIAGNOSIS — Z950 Presence of cardiac pacemaker: Secondary | ICD-10-CM | POA: Diagnosis not present

## 2015-11-26 DIAGNOSIS — Z8546 Personal history of malignant neoplasm of prostate: Secondary | ICD-10-CM | POA: Diagnosis not present

## 2015-11-26 NOTE — Progress Notes (Signed)
This encounter was created in error - please disregard.

## 2015-12-01 DIAGNOSIS — Z48812 Encounter for surgical aftercare following surgery on the circulatory system: Secondary | ICD-10-CM | POA: Diagnosis not present

## 2015-12-01 DIAGNOSIS — D62 Acute posthemorrhagic anemia: Secondary | ICD-10-CM | POA: Diagnosis not present

## 2015-12-01 DIAGNOSIS — I5042 Chronic combined systolic (congestive) and diastolic (congestive) heart failure: Secondary | ICD-10-CM | POA: Diagnosis not present

## 2015-12-01 DIAGNOSIS — I110 Hypertensive heart disease with heart failure: Secondary | ICD-10-CM | POA: Diagnosis not present

## 2015-12-01 DIAGNOSIS — E1140 Type 2 diabetes mellitus with diabetic neuropathy, unspecified: Secondary | ICD-10-CM | POA: Diagnosis not present

## 2015-12-01 DIAGNOSIS — E785 Hyperlipidemia, unspecified: Secondary | ICD-10-CM | POA: Diagnosis not present

## 2015-12-02 DIAGNOSIS — E1140 Type 2 diabetes mellitus with diabetic neuropathy, unspecified: Secondary | ICD-10-CM | POA: Diagnosis not present

## 2015-12-02 DIAGNOSIS — I5042 Chronic combined systolic (congestive) and diastolic (congestive) heart failure: Secondary | ICD-10-CM | POA: Diagnosis not present

## 2015-12-02 DIAGNOSIS — Z48812 Encounter for surgical aftercare following surgery on the circulatory system: Secondary | ICD-10-CM | POA: Diagnosis not present

## 2015-12-02 DIAGNOSIS — E785 Hyperlipidemia, unspecified: Secondary | ICD-10-CM | POA: Diagnosis not present

## 2015-12-02 DIAGNOSIS — I110 Hypertensive heart disease with heart failure: Secondary | ICD-10-CM | POA: Diagnosis not present

## 2015-12-02 DIAGNOSIS — D62 Acute posthemorrhagic anemia: Secondary | ICD-10-CM | POA: Diagnosis not present

## 2015-12-04 DIAGNOSIS — Z48812 Encounter for surgical aftercare following surgery on the circulatory system: Secondary | ICD-10-CM | POA: Diagnosis not present

## 2015-12-04 DIAGNOSIS — D62 Acute posthemorrhagic anemia: Secondary | ICD-10-CM | POA: Diagnosis not present

## 2015-12-04 DIAGNOSIS — I5042 Chronic combined systolic (congestive) and diastolic (congestive) heart failure: Secondary | ICD-10-CM | POA: Diagnosis not present

## 2015-12-04 DIAGNOSIS — I110 Hypertensive heart disease with heart failure: Secondary | ICD-10-CM | POA: Diagnosis not present

## 2015-12-04 DIAGNOSIS — E1140 Type 2 diabetes mellitus with diabetic neuropathy, unspecified: Secondary | ICD-10-CM | POA: Diagnosis not present

## 2015-12-04 DIAGNOSIS — E785 Hyperlipidemia, unspecified: Secondary | ICD-10-CM | POA: Diagnosis not present

## 2015-12-08 DIAGNOSIS — D62 Acute posthemorrhagic anemia: Secondary | ICD-10-CM | POA: Diagnosis not present

## 2015-12-08 DIAGNOSIS — E785 Hyperlipidemia, unspecified: Secondary | ICD-10-CM | POA: Diagnosis not present

## 2015-12-08 DIAGNOSIS — E1140 Type 2 diabetes mellitus with diabetic neuropathy, unspecified: Secondary | ICD-10-CM | POA: Diagnosis not present

## 2015-12-08 DIAGNOSIS — Z48812 Encounter for surgical aftercare following surgery on the circulatory system: Secondary | ICD-10-CM | POA: Diagnosis not present

## 2015-12-08 DIAGNOSIS — I110 Hypertensive heart disease with heart failure: Secondary | ICD-10-CM | POA: Diagnosis not present

## 2015-12-08 DIAGNOSIS — I5042 Chronic combined systolic (congestive) and diastolic (congestive) heart failure: Secondary | ICD-10-CM | POA: Diagnosis not present

## 2015-12-10 DIAGNOSIS — Z48812 Encounter for surgical aftercare following surgery on the circulatory system: Secondary | ICD-10-CM | POA: Diagnosis not present

## 2015-12-10 DIAGNOSIS — I5042 Chronic combined systolic (congestive) and diastolic (congestive) heart failure: Secondary | ICD-10-CM | POA: Diagnosis not present

## 2015-12-10 DIAGNOSIS — D62 Acute posthemorrhagic anemia: Secondary | ICD-10-CM | POA: Diagnosis not present

## 2015-12-10 DIAGNOSIS — I110 Hypertensive heart disease with heart failure: Secondary | ICD-10-CM | POA: Diagnosis not present

## 2015-12-10 DIAGNOSIS — E785 Hyperlipidemia, unspecified: Secondary | ICD-10-CM | POA: Diagnosis not present

## 2015-12-10 DIAGNOSIS — E1140 Type 2 diabetes mellitus with diabetic neuropathy, unspecified: Secondary | ICD-10-CM | POA: Diagnosis not present

## 2015-12-15 DIAGNOSIS — I5042 Chronic combined systolic (congestive) and diastolic (congestive) heart failure: Secondary | ICD-10-CM | POA: Diagnosis not present

## 2015-12-15 DIAGNOSIS — E785 Hyperlipidemia, unspecified: Secondary | ICD-10-CM | POA: Diagnosis not present

## 2015-12-15 DIAGNOSIS — E1140 Type 2 diabetes mellitus with diabetic neuropathy, unspecified: Secondary | ICD-10-CM | POA: Diagnosis not present

## 2015-12-15 DIAGNOSIS — I110 Hypertensive heart disease with heart failure: Secondary | ICD-10-CM | POA: Diagnosis not present

## 2015-12-15 DIAGNOSIS — D62 Acute posthemorrhagic anemia: Secondary | ICD-10-CM | POA: Diagnosis not present

## 2015-12-15 DIAGNOSIS — Z48812 Encounter for surgical aftercare following surgery on the circulatory system: Secondary | ICD-10-CM | POA: Diagnosis not present

## 2015-12-16 DIAGNOSIS — I110 Hypertensive heart disease with heart failure: Secondary | ICD-10-CM | POA: Diagnosis not present

## 2015-12-16 DIAGNOSIS — E785 Hyperlipidemia, unspecified: Secondary | ICD-10-CM | POA: Diagnosis not present

## 2015-12-16 DIAGNOSIS — I5042 Chronic combined systolic (congestive) and diastolic (congestive) heart failure: Secondary | ICD-10-CM | POA: Diagnosis not present

## 2015-12-16 DIAGNOSIS — Z48812 Encounter for surgical aftercare following surgery on the circulatory system: Secondary | ICD-10-CM | POA: Diagnosis not present

## 2015-12-16 DIAGNOSIS — D62 Acute posthemorrhagic anemia: Secondary | ICD-10-CM | POA: Diagnosis not present

## 2015-12-16 DIAGNOSIS — E1140 Type 2 diabetes mellitus with diabetic neuropathy, unspecified: Secondary | ICD-10-CM | POA: Diagnosis not present

## 2015-12-18 ENCOUNTER — Other Ambulatory Visit (INDEPENDENT_AMBULATORY_CARE_PROVIDER_SITE_OTHER): Payer: Medicare Other

## 2015-12-18 DIAGNOSIS — I5042 Chronic combined systolic (congestive) and diastolic (congestive) heart failure: Secondary | ICD-10-CM | POA: Diagnosis not present

## 2015-12-18 DIAGNOSIS — D62 Acute posthemorrhagic anemia: Secondary | ICD-10-CM | POA: Diagnosis not present

## 2015-12-18 DIAGNOSIS — E1140 Type 2 diabetes mellitus with diabetic neuropathy, unspecified: Secondary | ICD-10-CM | POA: Diagnosis not present

## 2015-12-18 DIAGNOSIS — Z794 Long term (current) use of insulin: Secondary | ICD-10-CM

## 2015-12-18 DIAGNOSIS — E1165 Type 2 diabetes mellitus with hyperglycemia: Secondary | ICD-10-CM | POA: Diagnosis not present

## 2015-12-18 DIAGNOSIS — E785 Hyperlipidemia, unspecified: Secondary | ICD-10-CM | POA: Diagnosis not present

## 2015-12-18 DIAGNOSIS — I110 Hypertensive heart disease with heart failure: Secondary | ICD-10-CM | POA: Diagnosis not present

## 2015-12-18 DIAGNOSIS — Z48812 Encounter for surgical aftercare following surgery on the circulatory system: Secondary | ICD-10-CM | POA: Diagnosis not present

## 2015-12-18 LAB — COMPREHENSIVE METABOLIC PANEL
ALBUMIN: 3.7 g/dL (ref 3.5–5.2)
ALT: 7 U/L (ref 0–53)
AST: 13 U/L (ref 0–37)
Alkaline Phosphatase: 122 U/L — ABNORMAL HIGH (ref 39–117)
BUN: 14 mg/dL (ref 6–23)
CALCIUM: 8.9 mg/dL (ref 8.4–10.5)
CHLORIDE: 104 meq/L (ref 96–112)
CO2: 26 meq/L (ref 19–32)
Creatinine, Ser: 1.15 mg/dL (ref 0.40–1.50)
GFR: 78.1 mL/min (ref >60.00)
Glucose, Bld: 176 mg/dL — ABNORMAL HIGH (ref 70–99)
Potassium: 4 mEq/L (ref 3.5–5.1)
Sodium: 140 mEq/L (ref 135–145)
Total Bilirubin: 0.6 mg/dL (ref 0.2–1.2)
Total Protein: 6.8 g/dL (ref 6.0–8.3)

## 2015-12-18 LAB — HEMOGLOBIN A1C: Hgb A1c MFr Bld: 7.8 % — ABNORMAL HIGH (ref 4.6–6.5)

## 2015-12-22 ENCOUNTER — Telehealth: Payer: Self-pay | Admitting: Endocrinology

## 2015-12-22 ENCOUNTER — Encounter: Payer: Self-pay | Admitting: Endocrinology

## 2015-12-22 ENCOUNTER — Ambulatory Visit (INDEPENDENT_AMBULATORY_CARE_PROVIDER_SITE_OTHER): Payer: Medicare Other | Admitting: Endocrinology

## 2015-12-22 VITALS — BP 120/60 | HR 73 | Ht 69.0 in | Wt 183.0 lb

## 2015-12-22 DIAGNOSIS — R413 Other amnesia: Secondary | ICD-10-CM | POA: Diagnosis not present

## 2015-12-22 DIAGNOSIS — I209 Angina pectoris, unspecified: Secondary | ICD-10-CM | POA: Diagnosis not present

## 2015-12-22 DIAGNOSIS — Z794 Long term (current) use of insulin: Secondary | ICD-10-CM | POA: Diagnosis not present

## 2015-12-22 DIAGNOSIS — E1165 Type 2 diabetes mellitus with hyperglycemia: Secondary | ICD-10-CM | POA: Diagnosis not present

## 2015-12-22 DIAGNOSIS — E1142 Type 2 diabetes mellitus with diabetic polyneuropathy: Secondary | ICD-10-CM

## 2015-12-22 NOTE — Patient Instructions (Signed)
Check blood sugars on waking up  3x weekly  Also check blood sugars about 2 hours after a meal and do this after different meals by rotation  Recommended blood sugar levels on waking up is 90-130 and about 2 hours after meal is 130-160  Please bring your blood sugar monitor to each visit, thank you  Gabapentin only as needed for tingling

## 2015-12-22 NOTE — Progress Notes (Signed)
Patient ID: [name], male   DOB: 07-12-1930, 82 y.o.   MRN: [id number]   Reason for Appointment:  Follow-up of blood pressure and diabetes  History of Present Illness    HYPERTENSION:  this has been well controlled previously.   Because blood pressure was relatively low on the last visit his Coreg was reduced to half tablet in the morning, previously HCTZ and Avapro were stopped. He is also on low-dose Aldactone from cardiologist He is now here for blood pressure follow-up Does not feel weak or tired  Type 2 DIABETES MELITUS, long-standing     He has been on basal bolus regimen of insulin for several years with usually fair control  Recent history:  Insulin regimen: NovoLog 3 units a.c., Lantus ?  units at bedtime, uses syringes            His blood sugars have been overall difficult to control, last A1c 7.8, relatively higher  Current blood sugar patterns and problems identified:  He is following an unknown regimen for his insulin and with his memory difficulties cannot remember how much she is taking  His Lantus insulin has been reduced to 20 units on his discharge from the hospital last month but not clear if he is taking a higher dose  His fasting readings were significantly high in late July but they are better in the last 10 days or so  He has sporadic high readings during the day and after supper probably based on his meal intake  He thinks he is taking 3 units at mealtimes but is inconsistent with his answers about his NovoLog doses  No hypoglycemia except a low-normal rating of 69 on Saturday evening  Weight is stable  Oral hypoglycemic drugs:   metformin ER 0.5g bid        Side effects from medications: diarrhea with 1.5g metformin Proper timing of medications in relation to meals: Yes.          Monitors blood glucose: 3- 4 times a day.    Glucometer:  One Touch          Mean values apply above for all meters except median for One  Touch  PRE-MEAL Fasting Lunch Dinner Bedtime Overall  Glucose range: 127-228  113-295   69-284    Mean/median: 172  170   206  172+/-47      Meals: 3 meals per day.  Breakfast 8 AM, Lunch 1 pm, dinner 5 pm   BreakfastIs usually eggs, grits, juice; may have peanut butter crackers for snack at night          Physical activity: walking At times, less recently   Wt Readings from Last 3 Encounters:  12/22/15 183 lb (83 kg)  11/25/15 181 lb (82.1 kg)  11/13/15 188 lb 7.9 oz (85.5 kg)   Lab Results  Component Value Date   HGBA1C 7.8 (H) 12/18/2015   HGBA1C 7.3 (H) 11/14/2015   HGBA1C 7.6 (H) 10/16/2015   Lab Results  Component Value Date   MICROALBUR 11.6 (H) 10/16/2015   LDLCALC 72 10/16/2015   CREATININE 1.15 12/18/2015            Medication List       Accurate as of 12/22/15 10:10 AM. Always use your most recent med list.          aspirin 81 MG chewable tablet Chew 81 mg by mouth daily.   atorvastatin 40 MG  tablet Commonly known as:  LIPITOR TAKE 1 TABLET (40 MG TOTAL) BY MOUTH DAILY.   calcium-vitamin D 500-200 MG-UNIT tablet Commonly known as:  OSCAL WITH D Take 1 tablet by mouth daily.   cholecalciferol 1000 units tablet Commonly known as:  VITAMIN D Take 1,000 Units by mouth daily.   cyanocobalamin 500 MCG tablet Take 500 mcg by mouth daily.   furosemide 20 MG tablet Commonly known as:  LASIX Take 1 tablet (20 mg total) by mouth daily.   gabapentin 300 MG capsule Commonly known as:  NEURONTIN TAKE 1 CAPSULE (300 MG TOTAL) BY MOUTH 3 (THREE) TIMES DAILY.   insulin glargine 100 UNIT/ML injection Commonly known as:  LANTUS Inject 0.2 mLs (20 Units total) into the skin at bedtime.   irbesartan 75 MG tablet Commonly known as:  AVAPRO Take 1 tablet (75 mg total) by mouth daily.   metFORMIN 500 MG 24 hr tablet Commonly known as:  GLUCOPHAGE-XR   metoprolol tartrate 25 MG tablet Commonly known as:  LOPRESSOR Take 1 tablet (25 mg total) by  mouth 2 (two) times daily.   nitroGLYCERIN 0.4 MG SL tablet Commonly known as:  NITROSTAT Place 1 tablet (0.4 mg total) under the tongue every 5 (five) minutes as needed for chest pain.   spironolactone 25 MG tablet Commonly known as:  ALDACTONE       Allergies: No Known Allergies  Past Medical History:  Diagnosis Date  . Cancer ([place])    hx of prostate ca  . Cataracts, bilateral    hx of  . CHF (congestive heart failure) ([place])   . Diabetes mellitus   . ED (erectile dysfunction)   . Hyperlipidemia   . Hypertension   . Neuromuscular disorder ([facility])    numbness in hand/cervical issues  . Pneumonia    hx of  . Syncope    hx of  . Urination frequency     Past Surgical History:  Procedure Laterality Date  . CARDIAC CATHETERIZATION N/A 11/13/2015   Procedure: Temporary Pacemaker;  Surgeon: Lorretta Harp, MD;  Location: [place] [facility];  Service: Cardiovascular;  Laterality: N/A;  . EP IMPLANTABLE DEVICE N/A 11/14/2015   Procedure: BiV Pacemaker Insertion CRT-P;  Surgeon: Deboraha Sprang, MD;  Location: [facility];  Service: Cardiovascular;  Laterality: N/A;  . EYE SURGERY     cataract surgery bilateral  . HERNIA REPAIR    . POSTERIOR CERVICAL FUSION/FORAMINOTOMY  11/18/2011   Procedure: POSTERIOR CERVICAL FUSION/FORAMINOTOMY LEVEL 1;  Surgeon: Eustace Moore, MD;  Location: [facility];  Service: Neurosurgery;  Laterality: Bilateral;  . PROSTATE SURGERY     s/p ca  . SMALL INTESTINE SURGERY     hx of  . TONSILLECTOMY      Family History  Problem Relation Age of Onset  . Diabetes Mother   . Hypertension Father     Social History:  reports that he has never smoked. He has never used smokeless tobacco. He reports that he does not drink alcohol or use drugs.  Review of Systems:   HYPERTENSION: Currently not on any specific treatment except diuretics given by cardiologist Electrolytes and renal function as follows:  Lab Results  Component Value Date    CREATININE 1.15 12/18/2015   BUN 14 12/18/2015   NA 140 12/18/2015   K 4.0 12/18/2015   CL 104 12/18/2015   CO2 26 12/18/2015    He is going to follow-up with his EP cardiologist in October after  his pacemaker insertion for complete heart block  He has had mild B-12 deficiency, Taking take B12 supplements as directed, His last level is normal  Lab Results  Component Value Date   WBC 7.2 11/19/2015   HGB 12.4 (L) 11/19/2015   HCT 36.6 (L) 11/19/2015   MCV 93.6 11/19/2015   PLT 172 11/19/2015   Lab Results  Component Value Date   VITAMINB12 830 08/28/2015     Hyperlipidemia: Well controlled  with Lipitor 40 mg, tends to have low HDL  Lab Results  Component Value Date   CHOL 120 10/16/2015   HDL 35.30 (L) 10/16/2015   LDLCALC 72 10/16/2015   LDLDIRECT 79.0 12/24/2013   TRIG 63.0 10/16/2015   CHOLHDL 3 10/16/2015    NEUROPATHY:  Had symptoms of tingling controlled with gabapentin.   He is taking gabapentin But not clear if he is taking this consistently  See last foot exam: 2/17    Examination:   BP 120/60   Pulse 73   Ht 5\' 9"  (1.753 m)   Wt 183 lb (83 kg)   SpO2 98%   BMI 27.02 kg/m   Body mass index is 27.02 kg/m.    No ankle edema  ASSESSMENT/ PLAN:   Diabetes type 2  See history of present illness for detailed discussion of current diabetes management, blood sugar patterns and problems identified  His blood sugars have been quite erratic over the last month Also not clear what his insulin doses are especially with regard to the Lantus, his daughter is usually helping him but not with insulin doses and he does not remember what he is taking He is also having variable readings after meals and not clear if he is consistently compliant with his insulin or the doses No hypoglycemia Overall control is probably adequate for his age Asked him to call back with his insulin doses Discussed timing of glucose monitoring at various times and blood sugar  targets  Hypertension:  His blood pressure is  still controlled without any specific medications except diuretics for his history of CHF Will defer any changes to the cardiologist  History of neuropathy: He can take gabapentin as needed only  Memory disturbance : He is not wanting to take any medications and he thinks his memory is normal, consider follow-up for evaluation with neurologist Continue B12  There are no Patient Instructions on file for this visit.       Keilee Denman 12/22/2015, 10:10 AM

## 2015-12-22 NOTE — Telephone Encounter (Signed)
PT called back to let you know he is injecting 34 U of Lantus every evening

## 2015-12-22 NOTE — Telephone Encounter (Signed)
To be advised.  

## 2015-12-23 DIAGNOSIS — Z48812 Encounter for surgical aftercare following surgery on the circulatory system: Secondary | ICD-10-CM | POA: Diagnosis not present

## 2015-12-23 DIAGNOSIS — E1140 Type 2 diabetes mellitus with diabetic neuropathy, unspecified: Secondary | ICD-10-CM | POA: Diagnosis not present

## 2015-12-23 DIAGNOSIS — D62 Acute posthemorrhagic anemia: Secondary | ICD-10-CM | POA: Diagnosis not present

## 2015-12-23 DIAGNOSIS — E785 Hyperlipidemia, unspecified: Secondary | ICD-10-CM | POA: Diagnosis not present

## 2015-12-23 DIAGNOSIS — I5042 Chronic combined systolic (congestive) and diastolic (congestive) heart failure: Secondary | ICD-10-CM | POA: Diagnosis not present

## 2015-12-23 DIAGNOSIS — I110 Hypertensive heart disease with heart failure: Secondary | ICD-10-CM | POA: Diagnosis not present

## 2015-12-30 DIAGNOSIS — E1140 Type 2 diabetes mellitus with diabetic neuropathy, unspecified: Secondary | ICD-10-CM | POA: Diagnosis not present

## 2015-12-30 DIAGNOSIS — D62 Acute posthemorrhagic anemia: Secondary | ICD-10-CM | POA: Diagnosis not present

## 2015-12-30 DIAGNOSIS — I5042 Chronic combined systolic (congestive) and diastolic (congestive) heart failure: Secondary | ICD-10-CM | POA: Diagnosis not present

## 2015-12-30 DIAGNOSIS — Z48812 Encounter for surgical aftercare following surgery on the circulatory system: Secondary | ICD-10-CM | POA: Diagnosis not present

## 2015-12-30 DIAGNOSIS — E785 Hyperlipidemia, unspecified: Secondary | ICD-10-CM | POA: Diagnosis not present

## 2015-12-30 DIAGNOSIS — I110 Hypertensive heart disease with heart failure: Secondary | ICD-10-CM | POA: Diagnosis not present

## 2015-12-31 ENCOUNTER — Ambulatory Visit (INDEPENDENT_AMBULATORY_CARE_PROVIDER_SITE_OTHER): Payer: Medicare Other | Admitting: Podiatry

## 2015-12-31 ENCOUNTER — Encounter: Payer: Self-pay | Admitting: Podiatry

## 2015-12-31 DIAGNOSIS — M79674 Pain in right toe(s): Secondary | ICD-10-CM | POA: Diagnosis not present

## 2015-12-31 DIAGNOSIS — B351 Tinea unguium: Secondary | ICD-10-CM

## 2015-12-31 DIAGNOSIS — M79675 Pain in left toe(s): Secondary | ICD-10-CM

## 2015-12-31 NOTE — Patient Instructions (Signed)
Diabetes and Foot Care Diabetes may cause you to have problems because of poor blood supply (circulation) to your feet and legs. This may cause the skin on your feet to become thinner, break easier, and heal more slowly. Your skin may become dry, and the skin may peel and crack. You may also have nerve damage in your legs and feet causing decreased feeling in them. You may not notice minor injuries to your feet that could lead to infections or more serious problems. Taking care of your feet is one of the most important things you can do for yourself.  HOME CARE INSTRUCTIONS  Wear shoes at all times, even in the house. Do not go barefoot. Bare feet are easily injured.  Check your feet daily for blisters, cuts, and redness. If you cannot see the bottom of your feet, use a mirror or ask someone for help.  Wash your feet with warm water (do not use hot water) and mild soap. Then pat your feet and the areas between your toes until they are completely dry. Do not soak your feet as this can dry your skin.  Apply a moisturizing lotion or petroleum jelly (that does not contain alcohol and is unscented) to the skin on your feet and to dry, brittle toenails. Do not apply lotion between your toes.  Trim your toenails straight across. Do not dig under them or around the cuticle. File the edges of your nails with an emery board or nail file.  Do not cut corns or calluses or try to remove them with medicine.  Wear clean socks or stockings every day. Make sure they are not too tight. Do not wear knee-high stockings since they may decrease blood flow to your legs.  Wear shoes that fit properly and have enough cushioning. To break in new shoes, wear them for just a few hours a day. This prevents you from injuring your feet. Always look in your shoes before you put them on to be sure there are no objects inside.  Do not cross your legs. This may decrease the blood flow to your feet.  If you find a minor scrape,  cut, or break in the skin on your feet, keep it and the skin around it clean and dry. These areas may be cleansed with mild soap and water. Do not cleanse the area with peroxide, alcohol, or iodine.  When you remove an adhesive bandage, be sure not to damage the skin around it.  If you have a wound, look at it several times a day to make sure it is healing.  Do not use heating pads or hot water bottles. They may burn your skin. If you have lost feeling in your feet or legs, you may not know it is happening until it is too late.  Make sure your health care provider performs a complete foot exam at least annually or more often if you have foot problems. Report any cuts, sores, or bruises to your health care provider immediately. SEEK MEDICAL CARE IF:   You have an injury that is not healing.  You have cuts or breaks in the skin.  You have an ingrown nail.  You notice redness on your legs or feet.  You feel burning or tingling in your legs or feet.  You have pain or cramps in your legs and feet.  Your legs or feet are numb.  Your feet always feel cold. SEEK IMMEDIATE MEDICAL CARE IF:   There is increasing redness,   swelling, or pain in or around a wound.  There is a red line that goes up your leg.  Pus is coming from a wound.  You develop a fever or as directed by your health care provider.  You notice a bad smell coming from an ulcer or wound.   This information is not intended to replace advice given to you by your health care provider. Make sure you discuss any questions you have with your health care provider.   Document Released: 04/23/2000 Document Revised: 12/27/2012 Document Reviewed: 10/03/2012 Elsevier Interactive Patient Education 2016 [employer]  

## 2015-12-31 NOTE — Progress Notes (Signed)
Patient ID: [name], male   DOB: 09-24-1930, 82 y.o.   MRN: [id number]   Subjective: This patient presents for scheduled visit complaining of painful toenails and walking wearing shoes and is requesting toenail debridement Patient has been hospitalized for cardiac issues per patient  Objective: Orientated 3 No open skin lesions bilaterally Scaling skin plantar left without any inflammatory base The toenails are elongated, brittle, incurvated, discolored and tender direct palpation 6-10  Assessment: Diabetic with a history of neuropathy Symptomatic onychomycoses 6-10  Plan: Debridement toenails 10 mechanically and electrically without any bleeding  Reappoint 4 months

## 2016-01-01 ENCOUNTER — Other Ambulatory Visit: Payer: Self-pay | Admitting: Endocrinology

## 2016-01-02 DIAGNOSIS — D62 Acute posthemorrhagic anemia: Secondary | ICD-10-CM | POA: Diagnosis not present

## 2016-01-02 DIAGNOSIS — Z48812 Encounter for surgical aftercare following surgery on the circulatory system: Secondary | ICD-10-CM | POA: Diagnosis not present

## 2016-01-02 DIAGNOSIS — I110 Hypertensive heart disease with heart failure: Secondary | ICD-10-CM | POA: Diagnosis not present

## 2016-01-02 DIAGNOSIS — E785 Hyperlipidemia, unspecified: Secondary | ICD-10-CM | POA: Diagnosis not present

## 2016-01-02 DIAGNOSIS — I5042 Chronic combined systolic (congestive) and diastolic (congestive) heart failure: Secondary | ICD-10-CM | POA: Diagnosis not present

## 2016-01-02 DIAGNOSIS — E1140 Type 2 diabetes mellitus with diabetic neuropathy, unspecified: Secondary | ICD-10-CM | POA: Diagnosis not present

## 2016-01-20 ENCOUNTER — Telehealth: Payer: Self-pay | Admitting: Endocrinology

## 2016-01-26 ENCOUNTER — Ambulatory Visit (INDEPENDENT_AMBULATORY_CARE_PROVIDER_SITE_OTHER): Payer: Medicare Other | Admitting: Endocrinology

## 2016-01-26 ENCOUNTER — Encounter: Payer: Self-pay | Admitting: Endocrinology

## 2016-01-26 VITALS — BP 120/72 | HR 74 | Temp 97.9°F | Resp 14 | Ht 69.0 in | Wt 182.4 lb

## 2016-01-26 DIAGNOSIS — I209 Angina pectoris, unspecified: Secondary | ICD-10-CM

## 2016-01-26 DIAGNOSIS — Z794 Long term (current) use of insulin: Secondary | ICD-10-CM

## 2016-01-26 DIAGNOSIS — E1165 Type 2 diabetes mellitus with hyperglycemia: Secondary | ICD-10-CM | POA: Diagnosis not present

## 2016-01-26 NOTE — Progress Notes (Signed)
Patient ID: [name], male   DOB: 03-Nov-1930, 82 y.o.   MRN: [id number]   Reason for Appointment:  Follow-up of blood pressure and diabetes  History of Present Illness    HYPERTENSION:  this has been well controlled previously.   Because blood pressure was relatively low on the last visit his Coreg was reduced to half tablet in the morning, previously HCTZ and Avapro were stopped. He is also on low-dose Aldactone from cardiologist He is now here for blood pressure follow-up Does not feel weak or tired  Type 2 DIABETES MELITUS, long-standing     He has been on basal bolus regimen of insulin for several years with usually fair control  Recent history:  Insulin regimen: NovoLog 5 units a.c., Lantus 34  units at bedtime, uses syringes            His blood sugars have been overall difficult to control, last A1c 7.8, relatively higher  Current blood sugar patterns and problems identified:  He is here for short-term follow-up since it was unclear what insulin doses he was taking on the last visit  Blood sugars are looking about the same overall  Has some blood session and blood sugars at all times especially midday  However most of his readings in the mornings are consistently high  He goes to bed soon after his evening meal and does not check readings after evening meal  He thinks he is trying to be active but needs to use a walker  Weight is stable  Oral hypoglycemic drugs:   metformin ER 0.5g bid        Side effects from medications: diarrhea with 1.5g metformin Proper timing of medications in relation to meals: Yes.          Monitors blood glucose: 3- 4 times a day.    Glucometer:  One Touch   Mean values apply above for all meters except median for One Touch  PRE-MEAL Fasting Lunch Dinner Bedtime Overall  Glucose range: 134-212  119-247  111-240     Mean/median: 173   172  166      Meals: 3 meals per day.  Breakfast 8 AM, Lunch 1 pm, dinner 5 pm   BreakfastIs usually eggs, grits, juice; may have peanut butter crackers for snack at night          Physical activity: walking at home   Wt Readings from Last 3 Encounters:  01/26/16 182 lb 6.4 oz (82.7 kg)  12/22/15 183 lb (83 kg)  11/25/15 181 lb (82.1 kg)   Lab Results  Component Value Date   HGBA1C 7.8 (H) 12/18/2015   HGBA1C 7.3 (H) 11/14/2015   HGBA1C 7.6 (H) 10/16/2015   Lab Results  Component Value Date   MICROALBUR 11.6 (H) 10/16/2015   LDLCALC 72 10/16/2015   CREATININE 1.15 12/18/2015     Other active problems as in review of systems       Medication List       Accurate as of 01/26/16 10:51 AM. Always use your most recent med list.          aspirin 81 MG chewable tablet Chew 81 mg by mouth daily.   atorvastatin 40 MG tablet Commonly known as:  LIPITOR TAKE 1 TABLET (40 MG TOTAL) BY MOUTH DAILY.   calcium-vitamin D 500-200 MG-UNIT tablet Commonly known as:  OSCAL WITH D Take 1 tablet by mouth daily.   cholecalciferol  1000 units tablet Commonly known as:  VITAMIN D Take 1,000 Units by mouth daily.   cyanocobalamin 500 MCG tablet Take 500 mcg by mouth daily.   furosemide 20 MG tablet Commonly known as:  LASIX Take 1 tablet (20 mg total) by mouth daily.   gabapentin 300 MG capsule Commonly known as:  NEURONTIN TAKE 1 CAPSULE (300 MG TOTAL) BY MOUTH 3 (THREE) TIMES DAILY.   insulin aspart 100 UNIT/ML injection Commonly known as:  novoLOG Inject 5 Units into the skin 3 (three) times daily before meals.   insulin glargine 100 UNIT/ML injection Commonly known as:  LANTUS Inject 0.2 mLs (20 Units total) into the skin at bedtime.   metFORMIN 500 MG 24 hr tablet Commonly known as:  GLUCOPHAGE-XR   nitroGLYCERIN 0.4 MG SL tablet Commonly known as:  NITROSTAT Place 1 tablet (0.4 mg total) under the tongue every 5 (five) minutes as needed for chest pain.   ONETOUCH VERIO test strip Generic drug:  glucose blood USE TO TEST 3 TIMES DAILY     spironolactone 25 MG tablet Commonly known as:  ALDACTONE   spironolactone 25 MG tablet Commonly known as:  ALDACTONE TAKE 1 TABLET (25 MG TOTAL) BY MOUTH DAILY.       Allergies: No Known Allergies  Past Medical History:  Diagnosis Date  . Cancer ([place])    hx of prostate ca  . Cataracts, bilateral    hx of  . CHF (congestive heart failure) ([place])   . Diabetes mellitus   . ED (erectile dysfunction)   . Hyperlipidemia   . Hypertension   . Neuromuscular disorder ([facility])    numbness in hand/cervical issues  . Pneumonia    hx of  . Syncope    hx of  . Urination frequency     Past Surgical History:  Procedure Laterality Date  . CARDIAC CATHETERIZATION N/A 11/13/2015   Procedure: Temporary Pacemaker;  Surgeon: Lorretta Harp, MD;  Location: [facility];  Service: Cardiovascular;  Laterality: N/A;  . EP IMPLANTABLE DEVICE N/A 11/14/2015   Procedure: BiV Pacemaker Insertion CRT-P;  Surgeon: Deboraha Sprang, MD;  Location: [facility];  Service: Cardiovascular;  Laterality: N/A;  . EYE SURGERY     cataract surgery bilateral  . HERNIA REPAIR    . POSTERIOR CERVICAL FUSION/FORAMINOTOMY  11/18/2011   Procedure: POSTERIOR CERVICAL FUSION/FORAMINOTOMY LEVEL 1;  Surgeon: Eustace Moore, MD;  Location: [facility];  Service: Neurosurgery;  Laterality: Bilateral;  . PROSTATE SURGERY     s/p ca  . SMALL INTESTINE SURGERY     hx of  . TONSILLECTOMY      Family History  Problem Relation Age of Onset  . Diabetes Mother   . Hypertension Father     Social History:  reports that he has never smoked. He has never used smokeless tobacco. He reports that he does not drink alcohol or use drugs.  Review of Systems:   HYPERTENSION: Currently not on any specific treatment except diuretics given by cardiologist Electrolytes and renal function as follows:  Lab Results  Component Value Date   CREATININE 1.15 12/18/2015   BUN 14 12/18/2015   NA 140 12/18/2015   K 4.0  12/18/2015   CL 104 12/18/2015   CO2 26 12/18/2015    He is going to follow-up with his EP cardiologist in October after his pacemaker insertion for complete heart block  He has had mild B-12 deficiency, Taking take B12 supplements as directed,  His last level is normal  Lab Results  Component Value Date   WBC 7.2 11/19/2015   HGB 12.4 (L) 11/19/2015   HCT 36.6 (L) 11/19/2015   MCV 93.6 11/19/2015   PLT 172 11/19/2015   Lab Results  Component Value Date   [id number] 08/28/2015     Hyperlipidemia: Well controlled  with Lipitor 40 mg, tends to have low HDL  Lab Results  Component Value Date   CHOL 120 10/16/2015   HDL 35.30 (L) 10/16/2015   LDLCALC 72 10/16/2015   LDLDIRECT 79.0 12/24/2013   TRIG 63.0 10/16/2015   CHOLHDL 3 10/16/2015    NEUROPATHY:  Had symptoms of tingling controlled Now but also occasional numbness in his fingers  He is taking gabapentin, he has not tried to cut back the dose  See last foot exam: 2/17    Examination:   BP 120/72   Pulse 74   Temp 97.9 F (36.6 C)   Resp 14   Ht 5\' 9"  (1.753 m)   Wt 182 lb 6.4 oz (82.7 kg)   SpO2 99%   BMI 26.94 kg/m   Body mass index is 26.94 kg/m.    No ankle edema  ASSESSMENT/ PLAN:   Diabetes type 2  See history of present illness for detailed discussion of current diabetes management, blood sugar patterns and problems identified  His blood sugars have been overall high However has fluctuation of his blood sugars, not able to adjust mealtime doses based on what he is eating Since fasting readings are consistently high he will go up at least 2 units on his Lantus He still does not want to consider an insulin with the pen   History of neuropathy: He can take gabapentin as needed only    Patient Instructions  Some sugars at bedtime  Lantus 36 units  Gabapentin only as needed for feet             Issacc Merlo 01/26/2016, 10:51 AM

## 2016-01-26 NOTE — Patient Instructions (Addendum)
Some sugars at bedtime  Lantus 36 units  Gabapentin only as needed for feet

## 2016-01-30 NOTE — Telephone Encounter (Signed)
Patient daughter has question about how her da need to take his medication insulin glargine (LANTUS) please advise.[phone]

## 2016-01-30 NOTE — Telephone Encounter (Signed)
I instructed his daughter how to draw up 36 units of Lantus on the insulin syringe.

## 2016-01-30 NOTE — Telephone Encounter (Signed)
Patient daughter ask you to give her a call as soon as you can.

## 2016-02-03 ENCOUNTER — Ambulatory Visit (INDEPENDENT_AMBULATORY_CARE_PROVIDER_SITE_OTHER): Payer: Medicare Other | Admitting: *Deleted

## 2016-02-03 DIAGNOSIS — Z23 Encounter for immunization: Secondary | ICD-10-CM | POA: Diagnosis not present

## 2016-02-10 ENCOUNTER — Encounter: Payer: Self-pay | Admitting: Internal Medicine

## 2016-02-21 ENCOUNTER — Other Ambulatory Visit: Payer: Self-pay | Admitting: Endocrinology

## 2016-02-24 ENCOUNTER — Encounter: Payer: Self-pay | Admitting: Internal Medicine

## 2016-02-24 ENCOUNTER — Encounter (INDEPENDENT_AMBULATORY_CARE_PROVIDER_SITE_OTHER): Payer: Self-pay

## 2016-02-24 ENCOUNTER — Ambulatory Visit (INDEPENDENT_AMBULATORY_CARE_PROVIDER_SITE_OTHER): Payer: Medicare Other | Admitting: Internal Medicine

## 2016-02-24 VITALS — BP 124/68 | HR 74 | Ht 69.0 in | Wt 179.0 lb

## 2016-02-24 DIAGNOSIS — I442 Atrioventricular block, complete: Secondary | ICD-10-CM | POA: Diagnosis not present

## 2016-02-24 DIAGNOSIS — I209 Angina pectoris, unspecified: Secondary | ICD-10-CM | POA: Diagnosis not present

## 2016-02-24 DIAGNOSIS — I447 Left bundle-branch block, unspecified: Secondary | ICD-10-CM

## 2016-02-24 DIAGNOSIS — I5042 Chronic combined systolic (congestive) and diastolic (congestive) heart failure: Secondary | ICD-10-CM | POA: Diagnosis not present

## 2016-02-24 DIAGNOSIS — Z950 Presence of cardiac pacemaker: Secondary | ICD-10-CM

## 2016-02-24 DIAGNOSIS — I519 Heart disease, unspecified: Secondary | ICD-10-CM

## 2016-02-24 NOTE — Progress Notes (Signed)
Patient Care Team: Elayne Snare, MD as PCP - General (Endocrinology)   HPI  [name] is a 82 y.o. male Seen in follow-up for CRT P implantation 7/17 undertaken for complete heart block.  EF at that time was 40-45%  Exercise tolerance is much improved. He denies chest pain or shortness of breath.  Records and Results Reviewed  8/17 Cr 1.15 K4.0  Past Medical History:  Diagnosis Date  . Cancer ([place])    hx of prostate ca  . Cataracts, bilateral    hx of  . CHF (congestive heart failure) ([place])   . Diabetes mellitus   . ED (erectile dysfunction)   . Hyperlipidemia   . Hypertension   . Neuromuscular disorder ([facility])    numbness in hand/cervical issues  . Pneumonia    hx of  . Syncope    hx of  . Urination frequency     Past Surgical History:  Procedure Laterality Date  . CARDIAC CATHETERIZATION N/A 11/13/2015   Procedure: Temporary Pacemaker;  Surgeon: Lorretta Harp, MD;  Location: [facility];  Service: Cardiovascular;  Laterality: N/A;  . EP IMPLANTABLE DEVICE N/A 11/14/2015   Procedure: BiV Pacemaker Insertion CRT-P;  Surgeon: Deboraha Sprang, MD;  Location: [facility];  Service: Cardiovascular;  Laterality: N/A;  . EYE SURGERY     cataract surgery bilateral  . HERNIA REPAIR    . POSTERIOR CERVICAL FUSION/FORAMINOTOMY  11/18/2011   Procedure: POSTERIOR CERVICAL FUSION/FORAMINOTOMY LEVEL 1;  Surgeon: Eustace Moore, MD;  Location: [facility];  Service: Neurosurgery;  Laterality: Bilateral;  . PROSTATE SURGERY     s/p ca  . SMALL INTESTINE SURGERY     hx of  . TONSILLECTOMY      Current Outpatient Prescriptions  Medication Sig Dispense Refill  . aspirin 81 MG chewable tablet Chew 81 mg by mouth daily.    Marland Kitchen atorvastatin (LIPITOR) 40 MG tablet TAKE 1 TABLET (40 MG TOTAL) BY MOUTH DAILY. 90 tablet 1  . calcium-vitamin D (OSCAL WITH D) 500-200 MG-UNIT per tablet Take 1 tablet by mouth daily.    . cholecalciferol (VITAMIN D) 1000 UNITS tablet  Take 1,000 Units by mouth daily.    . cyanocobalamin 500 MCG tablet Take 500 mcg by mouth daily.    . furosemide (LASIX) 20 MG tablet Take 1 tablet (20 mg total) by mouth daily. 30 tablet 3  . gabapentin (NEURONTIN) 300 MG capsule TAKE 1 CAPSULE (300 MG TOTAL) BY MOUTH 3 (THREE) TIMES DAILY. 270 capsule 1  . gabapentin (NEURONTIN) 300 MG capsule TAKE 1 CAPSULE (300 MG TOTAL) BY MOUTH 3 (THREE) TIMES DAILY. 270 capsule 1  . insulin aspart (NOVOLOG) 100 UNIT/ML injection Inject 5 Units into the skin 3 (three) times daily before meals.    . insulin glargine (LANTUS) 100 UNIT/ML injection Inject 0.2 mLs (20 Units total) into the skin at bedtime. (Patient taking differently: Inject 34 Units into the skin at bedtime. ) 10 mL 11  . metFORMIN (GLUCOPHAGE-XR) 500 MG 24 hr tablet     . nitroGLYCERIN (NITROSTAT) 0.4 MG SL tablet Place 1 tablet (0.4 mg total) under the tongue every 5 (five) minutes as needed for chest pain. 25 tablet 3  . ONETOUCH VERIO test strip USE TO TEST 3 TIMES DAILY 100 each 3  . spironolactone (ALDACTONE) 25 MG tablet     . spironolactone (ALDACTONE) 25 MG tablet TAKE 1 TABLET (25 MG TOTAL) BY MOUTH DAILY.  30 tablet 3   No current facility-administered medications for this visit.     No Known Allergies    Review of Systems negative except from HPI and PMH  Physical Exam BP 124/68   Pulse 74   Ht 5\' 9"  (1.753 m)   Wt 179 lb (81.2 kg)   SpO2 98%   BMI 26.43 kg/m  Well developed and well nourished in no acute distress HENT normal E scleral and icterus clear Neck Supple JVP flat; carotids brisk and full Clear to ausculation Device pocket well healed; without hematoma or erythema.  There is no tethering   Regular rate and rhythm, no murmurs gallops or rub Soft with active bowel sounds No clubbing cyanosis  Edema Alert and oriented, grossly normal motor and sensory function Skin Warm and Dry   ECG demonstrates AV pacing with monophasic or lead V1 and rs lead 1    Assessment and  Plan  Complete heart block  Nonischemic cardiac myopathy   CRT P-St. Jude  The patient's device was interrogated and the information was fully reviewed.  The device was reprogrammed to maximize longevity  Congestive heart failure-chronic-systolic/diastolic  Euvolemic continue current meds  stble        Current medicines are reviewed at length with the patient today .  The patient does not  have concerns regarding medicines.

## 2016-02-24 NOTE — Patient Instructions (Signed)
Medication Instructions: - Your physician recommends that you continue on your current medications as directed. Please refer to the Current Medication list given to you today.  Labwork: - none ordered  Procedures/Testing: - none ordered  Follow-Up: - Your physician recommends that you schedule a follow-up appointment in: 3-4 months with Dr. Tamala Julian  - Remote monitoring is used to monitor your Pacemaker of ICD from home. This monitoring reduces the number of office visits required to check your device to one time per year. It allows [place] to keep an eye on the functioning of your device to ensure it is working properly. You are scheduled for a device check from home on 05/25/16. You may send your transmission at any time that day. If you have a wireless device, the transmission will be sent automatically. After your physician reviews your transmission, you will receive a postcard with your next transmission date.   - Your physician wants you to follow-up in: 9 months with Tommye Standard, PA for Dr. Caryl Comes. You will receive a reminder letter in the mail two months in advance. If you don't receive a letter, please call our office to schedule the follow-up appointment.  Any Additional Special Instructions Will Be Listed Below (If Applicable).     If you need a refill on your cardiac medications before your next appointment, please call your pharmacy.

## 2016-02-26 LAB — CUP PACEART INCLINIC DEVICE CHECK
Implantable Lead Implant Date: 20170707
Implantable Lead Implant Date: 20170707
Implantable Lead Location: 753858
Implantable Lead Location: 753859
MDC IDC LEAD IMPLANT DT: 20170707
MDC IDC LEAD LOCATION: 753860
MDC IDC SESS DTM: 20171019164049
Pulse Gen Serial Number: 7918316

## 2016-03-11 DIAGNOSIS — E119 Type 2 diabetes mellitus without complications: Secondary | ICD-10-CM | POA: Diagnosis not present

## 2016-03-11 LAB — HM DIABETES EYE EXAM

## 2016-03-15 ENCOUNTER — Telehealth: Payer: Self-pay | Admitting: Endocrinology

## 2016-03-15 NOTE — Telephone Encounter (Signed)
Pt has been using more than 3 test strips a day can we call in a new rx for more testing even if just for this month so he can get test strips this month

## 2016-03-15 NOTE — Telephone Encounter (Signed)
Pt called and needs his test strips refilled and sent to the pharmacy.

## 2016-03-16 MED ORDER — ONETOUCH VERIO VI STRP: ORAL_STRIP | 2 refills | Status: DC

## 2016-03-16 NOTE — Telephone Encounter (Signed)
Refill submitted per patient's request.  

## 2016-03-16 NOTE — Telephone Encounter (Signed)
See message and please advise if we can increase the times per day he is testings.

## 2016-03-16 NOTE — Telephone Encounter (Signed)
Pt's daughter called again today, Pt is completely out of his test strips and needs them sent into the [facility] on [address] as soon as possible.

## 2016-03-16 NOTE — Telephone Encounter (Signed)
Rx for 4x daily ok

## 2016-03-23 ENCOUNTER — Other Ambulatory Visit (INDEPENDENT_AMBULATORY_CARE_PROVIDER_SITE_OTHER): Payer: Medicare Other

## 2016-03-23 DIAGNOSIS — E1165 Type 2 diabetes mellitus with hyperglycemia: Secondary | ICD-10-CM | POA: Diagnosis not present

## 2016-03-23 DIAGNOSIS — Z794 Long term (current) use of insulin: Secondary | ICD-10-CM | POA: Diagnosis not present

## 2016-03-23 LAB — COMPREHENSIVE METABOLIC PANEL
ALT: 10 U/L (ref 0–53)
AST: 14 U/L (ref 0–37)
Albumin: 3.8 g/dL (ref 3.5–5.2)
Alkaline Phosphatase: 92 U/L (ref 39–117)
BUN: 19 mg/dL (ref 6–23)
CALCIUM: 9.3 mg/dL (ref 8.4–10.5)
CHLORIDE: 106 meq/L (ref 96–112)
CO2: 26 meq/L (ref 19–32)
CREATININE: 1.15 mg/dL (ref 0.40–1.50)
GFR: 78.05 mL/min (ref >60.00)
GLUCOSE: 61 mg/dL — AB (ref 70–99)
Potassium: 4.1 mEq/L (ref 3.5–5.1)
Sodium: 141 mEq/L (ref 135–145)
Total Bilirubin: 0.7 mg/dL (ref 0.2–1.2)
Total Protein: 6.6 g/dL (ref 6.0–8.3)

## 2016-03-23 LAB — HEMOGLOBIN A1C: HEMOGLOBIN A1C: 6.5 % (ref 4.6–6.5)

## 2016-03-26 ENCOUNTER — Encounter: Payer: Self-pay | Admitting: Endocrinology

## 2016-03-26 ENCOUNTER — Ambulatory Visit (INDEPENDENT_AMBULATORY_CARE_PROVIDER_SITE_OTHER): Payer: Medicare Other | Admitting: Endocrinology

## 2016-03-26 VITALS — BP 126/68 | Ht 69.0 in | Wt 189.0 lb

## 2016-03-26 DIAGNOSIS — Z794 Long term (current) use of insulin: Secondary | ICD-10-CM

## 2016-03-26 DIAGNOSIS — I209 Angina pectoris, unspecified: Secondary | ICD-10-CM | POA: Diagnosis not present

## 2016-03-26 DIAGNOSIS — E1165 Type 2 diabetes mellitus with hyperglycemia: Secondary | ICD-10-CM

## 2016-03-26 NOTE — Patient Instructions (Addendum)
Check blood sugars on waking up  daily  Also check blood sugars about 2 hours after a meal and do this after different meals by rotation  Recommended blood sugar levels on waking up is 90-130 and about 2 hours after meal is 130-160  Please bring your blood sugar monitor to each visit, thank you  LANTUS 34 units and switch to am with the Novolog, don't take Lantus at nite from [employer]

## 2016-03-26 NOTE — Progress Notes (Signed)
Patient ID: [name], male   DOB: Sep 28, 1930, 82 y.o.   MRN: [id number]   Reason for Appointment:  Follow-up of blood pressure and diabetes  History of Present Illness    HYPERTENSION:  this has been well controlled previously.   Because blood pressure was relatively low on the last visit his Coreg was reduced to half tablet in the morning, previously HCTZ and Avapro were stopped. He is also on low-dose Aldactone from cardiologist He is now here for blood pressure follow-up Does not feel weak or tired  Type 2 DIABETES MELITUS, long-standing     He has been on basal bolus regimen of insulin for several years with usually fair control  Recent history:  Insulin regimen: NovoLog 5 units a.c., Lantus 34  units at bedtime, uses syringes           Oral hypoglycemic drugs:   metformin ER 0.5g bid   His blood sugars have been inconsistently controlled However A1c is lower than expected at 6.5 now, previously 7.3  Current blood sugar patterns and problems identified:  He is still checking his blood sugars at least twice a day but usually not after evening meal  Overall blood sugars show variability at all times with overall standard deviation 72  Recent FASTING blood sugars are mildly increased but has been as low as 52 in the morning, was 64 daily  He was told to reduce his Lantus by 2 units in the last visit but he is still taking about 35 units, he showed me what he had gone up in his syringe today  Glucose readings midday and afternoon are significantly variable especially early afternoon  Although he thinks he is taking his mealtime insulin consistently before eating he does not know why he has some readings over 300 before and after noon  Blood sugars are moderately increased around suppertime although not clear which readings are after meals   eats here for short-term follow-up since it was unclear what insulin doses he was taking on the last visit  Blood  sugars are looking about the same overall  Has some blood session and blood sugars at all times especially midday  However most of his readings in the mornings are consistently high  He goes to bed soon after his evening meal and does not check readings after evening meal  He thinks he is trying to be active but needs to use a walker  Weight is stable         Side effects from medications: diarrhea with 1.5g metformin Proper timing of medications in relation to meals: Yes.          Monitors blood glucose: 3- 4 times a day.    Glucometer:  One Touch   Mean values apply above for all meters except median for One Touch  PRE-MEAL Fasting Lunch Dinner Bedtime Overall  Glucose range: 64-230  96-328  89-230     Mean/median: 125  167    147    POST-MEAL PC Breakfast PC Lunch PC Dinner  Glucose range:  128-237  128-248   Mean/median:         Meals: 3 meals per day.  Breakfast 8 AM, Lunch 1 pm, dinner 5 pm   BreakfastIs usually eggs, grits, juice; may have peanut butter crackers for snack at night          Physical activity: walking at home   Wt Readings from  Last 3 Encounters:  03/26/16 189 lb (85.7 kg)  02/24/16 179 lb (81.2 kg)  01/26/16 182 lb 6.4 oz (82.7 kg)   Lab Results  Component Value Date   HGBA1C 6.5 03/23/2016   HGBA1C 7.8 (H) 12/18/2015   HGBA1C 7.3 (H) 11/14/2015   Lab Results  Component Value Date   MICROALBUR 11.6 (H) 10/16/2015   LDLCALC 72 10/16/2015   CREATININE 1.15 03/23/2016     Other active problems as in review of systems       Medication List       Accurate as of 03/26/16  9:54 AM. Always use your most recent med list.          aspirin 81 MG chewable tablet Chew 81 mg by mouth daily.   atorvastatin 40 MG tablet Commonly known as:  LIPITOR TAKE 1 TABLET (40 MG TOTAL) BY MOUTH DAILY.   calcium-vitamin D 500-200 MG-UNIT tablet Commonly known as:  OSCAL WITH D Take 1 tablet by mouth daily.   cholecalciferol 1000 units  tablet Commonly known as:  VITAMIN D Take 1,000 Units by mouth daily.   cyanocobalamin 500 MCG tablet Take 500 mcg by mouth daily.   furosemide 20 MG tablet Commonly known as:  LASIX Take 1 tablet (20 mg total) by mouth daily.   gabapentin 300 MG capsule Commonly known as:  NEURONTIN TAKE 1 CAPSULE (300 MG TOTAL) BY MOUTH 3 (THREE) TIMES DAILY.   gabapentin 300 MG capsule Commonly known as:  NEURONTIN TAKE 1 CAPSULE (300 MG TOTAL) BY MOUTH 3 (THREE) TIMES DAILY.   insulin aspart 100 UNIT/ML injection Commonly known as:  novoLOG Inject 5 Units into the skin 3 (three) times daily before meals.   insulin glargine 100 UNIT/ML injection Commonly known as:  LANTUS Inject 0.2 mLs (20 Units total) into the skin at bedtime.   metFORMIN 500 MG 24 hr tablet Commonly known as:  GLUCOPHAGE-XR   nitroGLYCERIN 0.4 MG SL tablet Commonly known as:  NITROSTAT Place 1 tablet (0.4 mg total) under the tongue every 5 (five) minutes as needed for chest pain.   ONETOUCH VERIO test strip Generic drug:  glucose blood USE TO TEST 4 TIMES DAILY   spironolactone 25 MG tablet Commonly known as:  ALDACTONE   spironolactone 25 MG tablet Commonly known as:  ALDACTONE TAKE 1 TABLET (25 MG TOTAL) BY MOUTH DAILY.       Allergies: No Known Allergies  Past Medical History:  Diagnosis Date  . Cancer ([place])    hx of prostate ca  . Cataracts, bilateral    hx of  . CHF (congestive heart failure) ([place])   . Diabetes mellitus   . ED (erectile dysfunction)   . Hyperlipidemia   . Hypertension   . Neuromuscular disorder (HCC)    numbness in hand/cervical issues  . Pneumonia    hx of  . Syncope    hx of  . Urination frequency     Past Surgical History:  Procedure Laterality Date  . CARDIAC CATHETERIZATION N/A 11/13/2015   Procedure: Temporary Pacemaker;  Surgeon: Lorretta Harp, MD;  Location: [facility];  Service: Cardiovascular;  Laterality: N/A;  . EP IMPLANTABLE DEVICE N/A  11/14/2015   Procedure: BiV Pacemaker Insertion CRT-P;  Surgeon: Deboraha Sprang, MD;  Location: [facility];  Service: Cardiovascular;  Laterality: N/A;  . EYE SURGERY     cataract surgery bilateral  . HERNIA REPAIR    . POSTERIOR CERVICAL FUSION/FORAMINOTOMY  11/18/2011  Procedure: POSTERIOR CERVICAL FUSION/FORAMINOTOMY LEVEL 1;  Surgeon: Eustace Moore, MD;  Location: [facility];  Service: Neurosurgery;  Laterality: Bilateral;  . PROSTATE SURGERY     s/p ca  . SMALL INTESTINE SURGERY     hx of  . TONSILLECTOMY      Family History  Problem Relation Age of Onset  . Diabetes Mother   . Hypertension Father     Social History:  reports that he has never smoked. He has never used smokeless tobacco. He reports that he does not drink alcohol or use drugs.  Review of Systems:    HYPERTENSION: Currently not on any specific treatment  Has been on Aldactone and Lasix given by cardiologist for history of heart failure No recent shortness of breath  Electrolytes and renal function as follows:  Lab Results  Component Value Date   CREATININE 1.15 03/23/2016   BUN 19 03/23/2016   NA 141 03/23/2016   K 4.1 03/23/2016   CL 106 03/23/2016   CO2 26 03/23/2016      He has had mild B-12 deficiency, Taking take B12 supplements as directed, His last level is normal  Lab Results  Component Value Date   WBC 7.2 11/19/2015   HGB 12.4 (L) 11/19/2015   HCT 36.6 (L) 11/19/2015   MCV 93.6 11/19/2015   PLT 172 11/19/2015   Lab Results  Component Value Date   VITAMINB12 830 08/28/2015    Hyperlipidemia: Well controlled  with Lipitor With LDL below 100 HDL is still low   Lab Results  Component Value Date   CHOL 120 10/16/2015   HDL 35.30 (L) 10/16/2015   LDLCALC 72 10/16/2015   LDLDIRECT 79.0 12/24/2013   TRIG 63.0 10/16/2015   CHOLHDL 3 10/16/2015    NEUROPATHY:  Had symptoms of tingling controlled in his feet He is taking gabapentin  He is not planning of more  numbness and some tingling in his right hand and apparently this is more at night  See last foot exam: 2/17    Examination:   BP 126/68   Ht 5\' 9"  (1.753 m)   Wt 189 lb (85.7 kg)   BMI 27.91 kg/m   Body mass index is 27.91 kg/m.    Tinel sign positive on the right side  No ankle edema  ASSESSMENT/ PLAN:   Diabetes type 2  See history of present illness for detailed discussion of current diabetes management, blood sugar patterns and problems identified  Blood sugars are showing marked variability at all times with relatively lower readings fasting, sporadic in the very high readings the rest of the day and generally higher readings towards the evening Not clear if Lantus is working consistently for him 24 hours Also has some tendency to mild hypoglycemia occasionally in the morning Blood sugar variability during the days later to differences in diet and possibly inconsistent compliance with mealtime insulin He again refuses to consider an insulin pen as he thinks he could not use it well  Recommendations:  Try to take Lantus in the MORNING instead of nighttime  Take consistent dose of 34 units in the morning  Call if having low sugars  Consider follow-up with nurse educator  Avoid excessive amounts of carbohydrates for meals and snacks  History of CHF: Stable without symptoms  Carpal tunnel syndrome right: Considering his age he will initially try wrist plan at night and discussed how this would help    Patient Instructions  Check blood sugars on waking up  daily  Also check blood sugars about 2 hours after a meal and do this after different meals by rotation  Recommended blood sugar levels on waking up is 90-130 and about 2 hours after meal is 130-160  Please bring your blood sugar monitor to each visit, thank you  LANTUS 34 units and switch to am with the Novolog     Counseling time on subjects discussed above is over 50% of today's 25 minute visit      Jeremian Whitby 03/26/2016, 9:54 AM

## 2016-04-08 ENCOUNTER — Encounter: Payer: Self-pay | Admitting: Endocrinology

## 2016-04-15 ENCOUNTER — Other Ambulatory Visit: Payer: Self-pay

## 2016-04-18 ENCOUNTER — Other Ambulatory Visit: Payer: Self-pay | Admitting: Endocrinology

## 2016-04-21 ENCOUNTER — Other Ambulatory Visit: Payer: Self-pay

## 2016-04-21 MED ORDER — METFORMIN HCL ER 500 MG PO TB24: ORAL_TABLET | ORAL | 2 refills | Status: DC

## 2016-04-21 MED ORDER — FUROSEMIDE 20 MG PO TABS: 20.0000 mg | ORAL_TABLET | Freq: Every day | ORAL | 3 refills | Status: DC

## 2016-04-22 ENCOUNTER — Other Ambulatory Visit: Payer: Self-pay

## 2016-04-22 MED ORDER — FUROSEMIDE 20 MG PO TABS: 20.0000 mg | ORAL_TABLET | Freq: Every day | ORAL | 3 refills | Status: DC

## 2016-04-28 ENCOUNTER — Ambulatory Visit (INDEPENDENT_AMBULATORY_CARE_PROVIDER_SITE_OTHER): Payer: Medicare Other | Admitting: Podiatry

## 2016-04-28 ENCOUNTER — Encounter: Payer: Self-pay | Admitting: Podiatry

## 2016-04-28 VITALS — BP 151/57 | HR 83 | Resp 18

## 2016-04-28 DIAGNOSIS — E1140 Type 2 diabetes mellitus with diabetic neuropathy, unspecified: Secondary | ICD-10-CM

## 2016-04-28 DIAGNOSIS — M79675 Pain in left toe(s): Secondary | ICD-10-CM

## 2016-04-28 DIAGNOSIS — M79674 Pain in right toe(s): Secondary | ICD-10-CM | POA: Diagnosis not present

## 2016-04-28 DIAGNOSIS — B351 Tinea unguium: Secondary | ICD-10-CM | POA: Diagnosis not present

## 2016-04-28 NOTE — Patient Instructions (Signed)

## 2016-04-29 NOTE — Progress Notes (Signed)
Patient ID: [name], male   DOB: 29-Apr-1931, 82 y.o.   MRN: [id number]    Subjective: This patient presents for scheduled visit complaining of painful toenails and walking wearing shoes and is requesting toenail debridement Patient has been hospitalized for cardiac issues per patient  Objective: Orientated 3 DP pulses 0/4 bilaterally PT pulses 1/4 right and 0/4 left Capillary reflex immediate bilaterally Sensation to 10 g monofilament wire intact 2/5 right and 1/5 left Vibratory sensation nonreactive bilaterally Ankle reflexes reactive bilaterally No open skin lesions bilaterally Scaling skin plantar left without any inflammatory base The toenails are elongated, brittle, incurvated, discolored and tender direct palpation 6-10 Patient uses walker Manual motor testing dorsi flexion, plantar flexion 5/5 bilaterally  Assessment: Diabetic Type 2 with peripheral neuropathy Diabetic Type 2c with peripheral arterial disease Symptomatic onychomycoses 6-10  Plan: Debridement toenails 10 mechanically and electrically without any bleeding  Reappoint 4 months

## 2016-05-21 ENCOUNTER — Other Ambulatory Visit (INDEPENDENT_AMBULATORY_CARE_PROVIDER_SITE_OTHER): Payer: Medicare Other

## 2016-05-21 DIAGNOSIS — Z794 Long term (current) use of insulin: Secondary | ICD-10-CM

## 2016-05-21 DIAGNOSIS — E1165 Type 2 diabetes mellitus with hyperglycemia: Secondary | ICD-10-CM | POA: Diagnosis not present

## 2016-05-21 LAB — COMPREHENSIVE METABOLIC PANEL
ALBUMIN: 3.8 g/dL (ref 3.5–5.2)
ALT: 9 U/L (ref 0–53)
AST: 13 U/L (ref 0–37)
Alkaline Phosphatase: 102 U/L (ref 39–117)
BILIRUBIN TOTAL: 0.9 mg/dL (ref 0.2–1.2)
BUN: 15 mg/dL (ref 6–23)
CALCIUM: 9.4 mg/dL (ref 8.4–10.5)
CO2: 27 mEq/L (ref 19–32)
CREATININE: 1.1 mg/dL (ref 0.40–1.50)
Chloride: 104 mEq/L (ref 96–112)
GFR: 82.12 mL/min (ref >60.00)
Glucose, Bld: 66 mg/dL — ABNORMAL LOW (ref 70–99)
Potassium: 3.9 mEq/L (ref 3.5–5.1)
Sodium: 140 mEq/L (ref 135–145)
Total Protein: 6.9 g/dL (ref 6.0–8.3)

## 2016-05-21 LAB — HEMOGLOBIN A1C: Hgb A1c MFr Bld: 8.4 % — ABNORMAL HIGH (ref 4.6–6.5)

## 2016-05-25 ENCOUNTER — Telehealth: Payer: Self-pay | Admitting: Cardiology

## 2016-05-25 ENCOUNTER — Ambulatory Visit (INDEPENDENT_AMBULATORY_CARE_PROVIDER_SITE_OTHER): Payer: Medicare Other | Admitting: *Deleted

## 2016-05-25 DIAGNOSIS — I442 Atrioventricular block, complete: Secondary | ICD-10-CM

## 2016-05-25 NOTE — Telephone Encounter (Signed)
Spoke with pt and reminded pt of remote transmission that is due today. Pt verbalized understanding.   

## 2016-05-26 ENCOUNTER — Other Ambulatory Visit: Payer: Medicare Other

## 2016-05-26 ENCOUNTER — Ambulatory Visit: Payer: Medicare Other | Admitting: Endocrinology

## 2016-05-28 NOTE — Progress Notes (Signed)
Remote pacemaker transmission.   

## 2016-05-29 LAB — CUP PACEART REMOTE DEVICE CHECK
Brady Statistic AP VP Percent: 87 %
Brady Statistic AS VP Percent: 13 %
Brady Statistic AS VS Percent: 1 %
Brady Statistic RA Percent Paced: 87 %
Date Time Interrogation Session: 20180117163812
Implantable Lead Implant Date: 20170707
Implantable Lead Implant Date: 20170707
Implantable Lead Location: 753858
Implantable Lead Location: 753859
Lead Channel Impedance Value: 590 Ohm
Lead Channel Impedance Value: 800 Ohm
Lead Channel Pacing Threshold Amplitude: 0.5 V
Lead Channel Pacing Threshold Amplitude: 1.25 V
Lead Channel Pacing Threshold Pulse Width: 0.5 ms
Lead Channel Pacing Threshold Pulse Width: 0.7 ms
Lead Channel Sensing Intrinsic Amplitude: 12 mV
Lead Channel Setting Sensing Sensitivity: 5 mV
MDC IDC LEAD IMPLANT DT: 20170707
MDC IDC LEAD LOCATION: 753860
MDC IDC MSMT BATTERY REMAINING LONGEVITY: 86 mo
MDC IDC MSMT BATTERY REMAINING PERCENTAGE: 95.5 %
MDC IDC MSMT BATTERY VOLTAGE: 2.99 V
MDC IDC MSMT LEADCHNL RA IMPEDANCE VALUE: 440 Ohm
MDC IDC MSMT LEADCHNL RA PACING THRESHOLD AMPLITUDE: 0.75 V
MDC IDC MSMT LEADCHNL RA PACING THRESHOLD PULSEWIDTH: 0.5 ms
MDC IDC MSMT LEADCHNL RA SENSING INTR AMPL: 1.2 mV
MDC IDC PG IMPLANT DT: 20170707
MDC IDC SET LEADCHNL LV PACING AMPLITUDE: 2.25 V
MDC IDC SET LEADCHNL LV PACING PULSEWIDTH: 0.7 ms
MDC IDC SET LEADCHNL RA PACING AMPLITUDE: 1.75 V
MDC IDC SET LEADCHNL RV PACING AMPLITUDE: 2.5 V
MDC IDC SET LEADCHNL RV PACING PULSEWIDTH: 0.4 ms
MDC IDC STAT BRADY AP VS PERCENT: 1 %
Pulse Gen Model: 3262
Pulse Gen Serial Number: 7918316

## 2016-06-02 ENCOUNTER — Encounter: Payer: Self-pay | Admitting: Cardiology

## 2016-06-15 ENCOUNTER — Encounter: Payer: Self-pay | Admitting: Interventional Cardiology

## 2016-06-15 ENCOUNTER — Ambulatory Visit (INDEPENDENT_AMBULATORY_CARE_PROVIDER_SITE_OTHER): Payer: Medicare Other | Admitting: Interventional Cardiology

## 2016-06-15 ENCOUNTER — Encounter (INDEPENDENT_AMBULATORY_CARE_PROVIDER_SITE_OTHER): Payer: Self-pay

## 2016-06-15 VITALS — BP 126/74 | HR 75 | Ht 69.0 in | Wt 180.6 lb

## 2016-06-15 DIAGNOSIS — I10 Essential (primary) hypertension: Secondary | ICD-10-CM | POA: Diagnosis not present

## 2016-06-15 DIAGNOSIS — I442 Atrioventricular block, complete: Secondary | ICD-10-CM

## 2016-06-15 DIAGNOSIS — I472 Ventricular tachycardia: Secondary | ICD-10-CM

## 2016-06-15 DIAGNOSIS — I447 Left bundle-branch block, unspecified: Secondary | ICD-10-CM | POA: Diagnosis not present

## 2016-06-15 DIAGNOSIS — I4729 Other ventricular tachycardia: Secondary | ICD-10-CM

## 2016-06-15 DIAGNOSIS — R001 Bradycardia, unspecified: Secondary | ICD-10-CM | POA: Diagnosis not present

## 2016-06-15 DIAGNOSIS — I5042 Chronic combined systolic (congestive) and diastolic (congestive) heart failure: Secondary | ICD-10-CM | POA: Diagnosis not present

## 2016-06-15 NOTE — Progress Notes (Signed)
Cardiology Office Note    Date:  06/15/2016   ID:  [name], DOB 1931-05-08, MRN [id number]  PCP:  Elayne Snare, MD  Cardiologist: Sinclair Grooms, MD   Chief Complaint  Patient presents with  . Congestive Heart Failure    History of Present Illness:  [name] is a 83 y.o. male who presents for combined systolic and diastolic heart failure, History AV block with left bundle branch block, hypertension, diabetes mellitus, hyperlipidemia, and remote history of syncope.Developed complete heart block in July and underwent pacemaker therapy with CRT. Previously has stable induction abnormality.  His last being seen he developed weakness and was found to be in third-degree heart block. Beta blocker therapy was discontinued but heart block did not resolve. He denies chest pain. He denies orthopnea and PND. Lower extremity swelling, has resolved on therapy and not recurred.   Past Medical History:  Diagnosis Date  . Cancer ([place])    hx of prostate ca  . Cataracts, bilateral    hx of  . CHF (congestive heart failure) ([place])   . Diabetes mellitus   . ED (erectile dysfunction)   . Hyperlipidemia   . Hypertension   . Neuromuscular disorder (HCC)    numbness in hand/cervical issues  . Pneumonia    hx of  . Syncope    hx of  . Urination frequency     Past Surgical History:  Procedure Laterality Date  . CARDIAC CATHETERIZATION N/A 11/13/2015   Procedure: Temporary Pacemaker;  Surgeon: Lorretta Harp, MD;  Location: [facility];  Service: Cardiovascular;  Laterality: N/A;  . EP IMPLANTABLE DEVICE N/A 11/14/2015   Procedure: BiV Pacemaker Insertion CRT-P;  Surgeon: Deboraha Sprang, MD;  Location: [facility];  Service: Cardiovascular;  Laterality: N/A;  . EYE SURGERY     cataract surgery bilateral  . HERNIA REPAIR    . POSTERIOR CERVICAL FUSION/FORAMINOTOMY  11/18/2011   Procedure: POSTERIOR CERVICAL FUSION/FORAMINOTOMY LEVEL 1;  Surgeon: Eustace Moore, MD;   Location: [facility];  Service: Neurosurgery;  Laterality: Bilateral;  . PROSTATE SURGERY     s/p ca  . SMALL INTESTINE SURGERY     hx of  . TONSILLECTOMY      Current Medications: Outpatient Medications Prior to Visit  Medication Sig Dispense Refill  . aspirin 81 MG chewable tablet Chew 81 mg by mouth daily.    Marland Kitchen atorvastatin (LIPITOR) 40 MG tablet TAKE 1 TABLET (40 MG TOTAL) BY MOUTH DAILY. 90 tablet 1  . calcium-vitamin D (OSCAL WITH D) 500-200 MG-UNIT per tablet Take 1 tablet by mouth daily.    . cholecalciferol (VITAMIN D) 1000 UNITS tablet Take 1,000 Units by mouth daily.    . cyanocobalamin 500 MCG tablet Take 500 mcg by mouth daily.    . furosemide (LASIX) 20 MG tablet Take 1 tablet (20 mg total) by mouth daily. 90 tablet 3  . gabapentin (NEURONTIN) 300 MG capsule TAKE 1 CAPSULE (300 MG TOTAL) BY MOUTH 3 (THREE) TIMES DAILY. 270 capsule 1  . insulin aspart (NOVOLOG) 100 UNIT/ML injection Inject 5 Units into the skin 3 (three) times daily before meals.    . insulin glargine (LANTUS) 100 UNIT/ML injection Inject 0.2 mLs (20 Units total) into the skin at bedtime. (Patient taking differently: Inject 34 Units into the skin at bedtime. ) 10 mL 11  . metFORMIN (GLUCOPHAGE-XR) 500 MG 24 hr tablet Take 2 tablets my mouth daily with supper 180 tablet  2  . nitroGLYCERIN (NITROSTAT) 0.4 MG SL tablet Place 1 tablet (0.4 mg total) under the tongue every 5 (five) minutes as needed for chest pain. 25 tablet 3  . ONETOUCH VERIO test strip USE TO TEST 4 TIMES DAILY 200 each 2  . spironolactone (ALDACTONE) 25 MG tablet TAKE 1 TABLET (25 MG TOTAL) BY MOUTH DAILY. 30 tablet 2  . gabapentin (NEURONTIN) 300 MG capsule TAKE 1 CAPSULE (300 MG TOTAL) BY MOUTH 3 (THREE) TIMES DAILY. (Patient not taking: Reported on 03/26/2016) 270 capsule 1  . spironolactone (ALDACTONE) 25 MG tablet      No facility-administered medications prior to visit.      Allergies:   Patient has no known allergies.   Social  History   Social History  . Marital status: Widowed    Spouse name: N/A  . Number of children: N/A  . Years of education: N/A   Occupational History  . retired     Actuary   Social History Main Topics  . Smoking status: Never Smoker  . Smokeless tobacco: Never Used  . Alcohol use No     Comment: daughter states that used to be "heavy weekend drinker" but quit 10 + years ago  . Drug use: No  . Sexual activity: Not Currently   Other Topics Concern  . None   Social History Narrative  . None     Family History:  The patient's family history includes Diabetes in his mother; Hypertension in his father.   ROS:   Please see the history of present illness.    Intermittent right lateral chest discomfort of uncertain etiology. Appetite is been stable. Denies orthopnea and PND.  All other systems reviewed and are negative.   PHYSICAL EXAM:   VS:  BP 126/74 (BP Location: Right Arm)   Pulse 75   Ht 5\' 9"  (1.753 m)   Wt 180 lb 9.6 oz (81.9 kg)   BMI 26.67 kg/m    GEN: Well nourished, well developed, in no acute distress Area elderly and frail appearing HEENT: normal  Neck: no JVD, carotid bruits, or masses Cardiac: RRR; no murmurs, rubs, or gallops,no edema  Respiratory:  clear to auscultation bilaterally, normal work of breathing GI: soft, nontender, nondistended, + BS MS: no deformity or atrophy  Skin: warm and dry, no rash Neuro:  Alert and Oriented x 3, Strength and sensation are intact Psych: euthymic mood, full affect  Wt Readings from Last 3 Encounters:  06/15/16 180 lb 9.6 oz (81.9 kg)  03/26/16 189 lb (85.7 kg)  02/24/16 179 lb (81.2 kg)      Studies/Labs Reviewed:   EKG:  EKG  Repeated.. Last performed in the device clinic in October thousand 17 revealed AV sequential pacing.  Recent Labs: 11/13/2015: TSH 2.380 11/19/2015: Hemoglobin 12.4; Platelets 172 05/21/2016: ALT 9; BUN 15; Creatinine, Ser 1.10; Potassium 3.9; Sodium 140   Lipid  Panel    Component Value Date/Time   CHOL 120 10/16/2015 0759   TRIG 63.0 10/16/2015 0759   HDL 35.30 (L) 10/16/2015 0759   CHOLHDL 3 10/16/2015 0759   VLDL 12.6 10/16/2015 0759   LDLCALC 72 10/16/2015 0759   LDLDIRECT 79.0 12/24/2013 1048    Additional studies/ records that were reviewed today include:  Echocardiogram performed in July 2017: Study Conclusions  - Left ventricle: Diffuse hypokinesis worse in the inferior and   posterior lateral walls. The cavity size was mildly dilated. Wall   thickness was increased in a pattern  of mild LVH. Systolic   function was mildly to moderately reduced. The estimated ejection   fraction was in the range of 40% to 45%. Doppler parameters are   consistent with both elevated ventricular end-diastolic filling   pressure and elevated left atrial filling pressure. - Mitral valve: moderately calcified anterior leaflet. There was   mild regurgitation. - Left atrium: The atrium was moderately dilated. - Right atrium: The atrium was mildly dilated. - Atrial septum: No defect or patent foramen ovale was identified. - Systemic veins: ? catheter in IVC.   ASSESSMENT:    1. Chronic combined systolic and diastolic heart failure ([place])   2. Essential hypertension   3. Left bundle branch block   4. Bradycardia, severe sinus   5. Complete heart block ([place])   6. NSVT (nonsustained ventricular tachycardia) (HCC)      PLAN:  In order of problems listed above:  1. Mild systolic dysfunction. No evidence of volume overload. Since I last saw him he is also receive resynchronization therapy. He developed complete heart block and at the time of pacemaker insertion resynchronization was performed. Recent laboratory data do not suggest volume contraction. He is on furosemide 20 mg and Aldactone 25 mg daily. We may be able to decrease intensity of diuretic therapy. We'll make a determination at the next office visit. 2. Excellent control and perhaps almost  too low for an 83 year old. This needs to be monitored closely. 3. Status post pacemaker therapy 4. Status post pacemaker therapy    Medication Adjustments/Labs and Tests Ordered: Current medicines are reviewed at length with the patient today.  Concerns regarding medicines are outlined above.  Medication changes, Labs and Tests ordered today are listed in the Patient Instructions below. Patient Instructions  Medication Instructions:  None  Labwork: None  Testing/Procedures: None  Follow-Up: Your physician wants you to follow-up in: 6 months with Dr. Tamala Julian.  You will receive a reminder letter in the mail two months in advance. If you don't receive a letter, please call our office to schedule the follow-up appointment.   Any Other Special Instructions Will Be Listed Below (If Applicable).     If you need a refill on your cardiac medications before your next appointment, please call your pharmacy.      Signed, Sinclair Grooms, MD  06/15/2016 2:04 PM    [facility] [address], [place], [place]  [address] Phone: [phone]; Fax: [phone]

## 2016-06-15 NOTE — Patient Instructions (Signed)
Medication Instructions:  None  Labwork: None  Testing/Procedures: None  Follow-Up: Your physician wants you to follow-up in: 6 months with Dr. Smith.  You will receive a reminder letter in the mail two months in advance. If you don't receive a letter, please call our office to schedule the follow-up appointment.   Any Other Special Instructions Will Be Listed Below (If Applicable).     If you need a refill on your cardiac medications before your next appointment, please call your pharmacy.   

## 2016-06-17 ENCOUNTER — Ambulatory Visit (INDEPENDENT_AMBULATORY_CARE_PROVIDER_SITE_OTHER): Payer: Medicare Other | Admitting: Endocrinology

## 2016-06-17 ENCOUNTER — Encounter: Payer: Self-pay | Admitting: Endocrinology

## 2016-06-17 VITALS — BP 118/68 | HR 75 | Temp 97.3°F | Resp 16 | Ht 69.0 in | Wt 180.4 lb

## 2016-06-17 DIAGNOSIS — Z794 Long term (current) use of insulin: Secondary | ICD-10-CM | POA: Diagnosis not present

## 2016-06-17 DIAGNOSIS — E1165 Type 2 diabetes mellitus with hyperglycemia: Secondary | ICD-10-CM | POA: Diagnosis not present

## 2016-06-17 DIAGNOSIS — G5601 Carpal tunnel syndrome, right upper limb: Secondary | ICD-10-CM

## 2016-06-17 DIAGNOSIS — E1142 Type 2 diabetes mellitus with diabetic polyneuropathy: Secondary | ICD-10-CM

## 2016-06-17 DIAGNOSIS — E538 Deficiency of other specified B group vitamins: Secondary | ICD-10-CM

## 2016-06-17 NOTE — Progress Notes (Signed)
Patient ID: [name], male   DOB: 1931/05/02, 83 y.o.   MRN: [id number]   Reason for Appointment:  Follow-up of blood pressure and diabetes  History of Present Illness    HYPERTENSION:  this has been well controlled previously.   Because blood pressure was relatively low on the last visit his Coreg was reduced to half tablet in the morning, previously HCTZ and Avapro were stopped. He is also on low-dose Aldactone from cardiologist He is now here for blood pressure follow-up Does not feel weak or tired  Type 2 DIABETES MELITUS, long-standing     He has been on basal bolus regimen of insulin for several years with usually fair control  Recent history:  Insulin regimen: NovoLog 5 units a.c., Lantus 34  units at bedtime, uses syringes           Oral hypoglycemic drugs:   metformin ER 0.5g bid   His blood sugars have been inconsistently controlled, last A1c 8.4, previously has low as 6.5  Current blood sugar patterns and problems identified:  He appears to be not consistently compliant with his insulin regimen now  He has had marked fluctuation in his blood sugars at all times especially fasting and admits that he may not take his Lantus every night as it may forget  Again he does not follow instructions given  He was told to take his LANTUS in the morning on the last visit but is still doing it in the evening  He may tend to forget his mealtime insulin also at times especially at lunch or supper  No hypoglycemia but lowest reading 60 1 in the afternoon probably related to variable food intake  Although he thinks he is eating the same his weight has gone down significantly  He is only mildly active, has limited mobility         Side effects from medications: diarrhea with 1.5g metformin Proper timing of medications in relation to meals: Yes.          Monitors blood glucose: 3- 4 times a day.    Glucometer:  One Touch   Mean values apply above for all meters  except median for One Touch  PRE-MEAL Fasting Lunch Dinner Bedtime Overall  Glucose range: 82-311  61-282  90-438   61-438   Mean/median: 160     169       Meals: 3 meals per day.  Breakfast 8 AM, Lunch 1 pm, dinner 5 pm   BreakfastIs usually eggs, grits, juice; may have peanut butter crackers for snack at night          Physical activity: walking at home   Wt Readings from Last 3 Encounters:  06/17/16 180 lb 6 oz (81.8 kg)  06/15/16 180 lb 9.6 oz (81.9 kg)  03/26/16 189 lb (85.7 kg)   Lab Results  Component Value Date   HGBA1C 8.4 (H) 05/21/2016   HGBA1C 6.5 03/23/2016   HGBA1C 7.8 (H) 12/18/2015   Lab Results  Component Value Date   MICROALBUR 11.6 (H) 10/16/2015   LDLCALC 72 10/16/2015   CREATININE 1.10 05/21/2016     Other active problems as in review of systems     Allergies as of 06/17/2016   No Known Allergies     Medication List       Accurate as of 06/17/16  9:05 PM. Always use your most recent med list.  aspirin 81 MG chewable tablet Chew 81 mg by mouth daily.   atorvastatin 40 MG tablet Commonly known as:  LIPITOR TAKE 1 TABLET (40 MG TOTAL) BY MOUTH DAILY.   calcium-vitamin D 500-200 MG-UNIT tablet Commonly known as:  OSCAL WITH D Take 1 tablet by mouth daily.   cholecalciferol 1000 units tablet Commonly known as:  VITAMIN D Take 1,000 Units by mouth daily.   cyanocobalamin 500 MCG tablet Take 500 mcg by mouth daily.   furosemide 20 MG tablet Commonly known as:  LASIX Take 1 tablet (20 mg total) by mouth daily.   gabapentin 300 MG capsule Commonly known as:  NEURONTIN TAKE 1 CAPSULE (300 MG TOTAL) BY MOUTH 3 (THREE) TIMES DAILY.   insulin aspart 100 UNIT/ML injection Commonly known as:  novoLOG Inject 5 Units into the skin 3 (three) times daily before meals.   insulin glargine 100 UNIT/ML injection Commonly known as:  LANTUS Inject 0.2 mLs (20 Units total) into the skin at bedtime.   metFORMIN 500 MG 24 hr  tablet Commonly known as:  GLUCOPHAGE-XR Take 2 tablets my mouth daily with supper   nitroGLYCERIN 0.4 MG SL tablet Commonly known as:  NITROSTAT Place 1 tablet (0.4 mg total) under the tongue every 5 (five) minutes as needed for chest pain.   ONETOUCH VERIO test strip Generic drug:  glucose blood USE TO TEST 4 TIMES DAILY   spironolactone 25 MG tablet Commonly known as:  ALDACTONE TAKE 1 TABLET (25 MG TOTAL) BY MOUTH DAILY.       Allergies: No Known Allergies  Past Medical History:  Diagnosis Date  . Cancer ([place])    hx of prostate ca  . Cataracts, bilateral    hx of  . CHF (congestive heart failure) ([place])   . Diabetes mellitus   . ED (erectile dysfunction)   . Hyperlipidemia   . Hypertension   . Neuromuscular disorder ([facility])    numbness in hand/cervical issues  . Pneumonia    hx of  . Syncope    hx of  . Urination frequency     Past Surgical History:  Procedure Laterality Date  . CARDIAC CATHETERIZATION N/A 11/13/2015   Procedure: Temporary Pacemaker;  Surgeon: Patrick Harp, MD;  Location: [facility];  Service: Cardiovascular;  Laterality: N/A;  . EP IMPLANTABLE DEVICE N/A 11/14/2015   Procedure: BiV Pacemaker Insertion CRT-P;  Surgeon: Patrick Sprang, MD;  Location: [place];  Service: Cardiovascular;  Laterality: N/A;  . EYE SURGERY     cataract surgery bilateral  . HERNIA REPAIR    . POSTERIOR CERVICAL FUSION/FORAMINOTOMY  11/18/2011   Procedure: POSTERIOR CERVICAL FUSION/FORAMINOTOMY LEVEL 1;  Surgeon: Patrick Moore, MD;  Location: [facility];  Service: Neurosurgery;  Laterality: Bilateral;  . PROSTATE SURGERY     s/p ca  . SMALL INTESTINE SURGERY     hx of  . TONSILLECTOMY      Family History  Problem Relation Age of Onset  . Diabetes Mother   . Hypertension Father     Social History:  reports that he has never smoked. He has never used smokeless tobacco. He reports that he does not drink alcohol or use drugs.  Review of  Systems:    HYPERTENSION:    Has been on Aldactone and Lasix given by cardiologist for history of heart failure, Has had recent follow-up No significant shortness of breath on activity  Electrolytes and renal function as follows:  Lab Results  Component Value Date   CREATININE 1.10 05/21/2016   BUN 15 05/21/2016   NA 140 05/21/2016   K 3.9 05/21/2016   CL 104 05/21/2016   CO2 27 05/21/2016      He has had mild B-12 deficiency, Taking take B12 supplements as directed, His last level is normal  Lab Results  Component Value Date   WBC 7.2 11/19/2015   HGB 12.4 (L) 11/19/2015   HCT 36.6 (L) 11/19/2015   MCV 93.6 11/19/2015   PLT 172 11/19/2015   Lab Results  Component Value Date   VITAMINB12 830 08/28/2015    Hyperlipidemia: Well controlled  with Lipitor With LDL  100 HDL is  low   Lab Results  Component Value Date   CHOL 120 10/16/2015   HDL 35.30 (L) 10/16/2015   LDLCALC 72 10/16/2015   LDLDIRECT 79.0 12/24/2013   TRIG 63.0 10/16/2015   CHOLHDL 3 10/16/2015    NEUROPATHY:  Had symptoms of tingling controlled in his feet He is taking gabapentin with good relief  He is having numbness and some tingling in his right hand and this is more at night He thinks he tried the wrist splint that was given but it does not help, has more symptoms during the daytime including when driving now  See last foot exam: 2/17    Examination:   BP 118/68 (Cuff Size: Normal)   Pulse 75   Temp 97.3 F (36.3 C) (Oral)   Resp 16   Ht 5\' 9"  (1.753 m)   Wt 180 lb 6 oz (81.8 kg)   SpO2 98%   BMI 26.64 kg/m   Body mass index is 26.64 kg/m.     No ankle edema  ASSESSMENT/ PLAN:   Diabetes type 2  See history of present illness for detailed discussion of current diabetes management, blood sugar patterns and problems identified  Blood sugars are showing marked variability at all times Some of his variability is now related to inconsistent compliance with his insulin as she  forgets Again discussed that he would be able to remember his Lantus better he takes it in the morning, he did not remember to change it on the last visit Also probably cannot adjust his mealtime insulin based on what he is eating and postprandial readings are variable Not checking readings after supper also He again refuses to consider an insulin pen as he thinks he could not use it well Unlikely he can follow instructions how to use the V-go pump  Recommendations:  Try to take Lantus in the MORNING instead of nighttime  Take consistent dose of 34 units in the morning  He will try to be more compliant with taking mealtime insulin  Call if having low sugars at any time  Consider follow-up with nurse educator  Avoid skipping meals  History of CHF: Stable, no recent symptoms. Blood pressure is still low normal but asymptomatic and renal function stable Advised them to call if he has any lightheadedness or increased shortness of breath  Carpal tunnel syndrome right: Will refer him for sports medicine consultation for possible steroid injection  Counseling time on subjects discussed above is over 50% of today's 25 minute visit    Patient Instructions  LANTUS 34 AND TAKE IN AM with Novolog            Blasa Raisch 06/17/2016, 9:05 PM

## 2016-06-17 NOTE — Patient Instructions (Signed)
LANTUS 34 AND TAKE IN AM with Novolog

## 2016-06-23 ENCOUNTER — Encounter: Payer: Self-pay | Admitting: Family Medicine

## 2016-06-23 ENCOUNTER — Ambulatory Visit (INDEPENDENT_AMBULATORY_CARE_PROVIDER_SITE_OTHER): Payer: Medicare Other | Admitting: Family Medicine

## 2016-06-23 DIAGNOSIS — M79642 Pain in left hand: Secondary | ICD-10-CM

## 2016-06-23 DIAGNOSIS — M79641 Pain in right hand: Secondary | ICD-10-CM

## 2016-06-28 DIAGNOSIS — M79643 Pain in unspecified hand: Secondary | ICD-10-CM | POA: Insufficient documentation

## 2016-06-28 NOTE — Assessment & Plan Note (Addendum)
Reports pain and numbness in both hands. This is chronic in nature. His history doesn't suggest Carpal tunnel and more likely diabetic neuropathy. He has used cock up splints but not at night. Possible to have some underlying arthritis that could be contributing.  - provided cock up splint for both hands and to wear at night  - he will f/u 2-3. If still having significant pain, could try carpal tunnel injection vs x-ray of wrist and hands. Could perform a [place] evaluation of carpal tunnel but wouldn't likely be a surgical candidate.

## 2016-06-28 NOTE — Progress Notes (Signed)
  [name] - 83 y.o. male MRN [id number]  Date of birth: [birth year]  SUBJECTIVE:  Including CC & ROS.   [name] is 83 yo M that is presenting with dorsal and volar sided hand pain and numbness. He reports this started several years ago. He has numbness on either side of his hand. He has used cock up splints on both hands but does not wear them at night. He denies any injury to his hands. He feels like his symptoms are staying the same. He takes gabapentin but doesn't notice a difference. He denies any burning sensation. He doesn't seem to drop things.   ROS: No unexpected weight loss, fever, chills, swelling, instability, muscle pain, redness, otherwise see HPI    HISTORY: Past Medical, Surgical, Social, and Family History Reviewed & Updated per EMR.   Pertinent Historical Findings include: PMSHx - DM2, HTN, HF,  PSHx -  No tobacco use  FHx -  HTN  DATA REVIEWED: None to review   PHYSICAL EXAM:  VS: BP:126/73  HR: bpm  TEMP: ( )  RESP:   HT:5\' 9"  (175.3 cm)   WT:165 lb (74.8 kg)  BMI:24.4 PHYSICAL EXAM: Gen: NAD, alert, cooperative with exam,  HEENT: clear conjunctiva, EOMI CV:  no edema, capillary refill brisk,  Resp: non-labored, normal speech Skin: no rashes, normal turgor  Neuro: no gross deficits.  Psych:  alert and oriented Left and right hands/Wrists:  Some atrophy of the thenar eminence on right hand compared to the left  No obvious signs of effusion  No ecchymosis  No scapholunate dissociation Normal grip strength  No TTP of carpal bones  No TTP of the snuffbox  Normal wrist ROM in all planes  Normal strength to resistance  Normal finger abduction and adduction  Sensation testing was grossly normal  Negative Tinel's  Negative Finkelstein's    ASSESSMENT & PLAN:   Hand pain Reports pain and numbness in both hands. This is chronic in nature. His history doesn't suggest Carpal tunnel and more likely diabetic neuropathy. He has used cock up splints but  not at night. Possible to have some underlying arthritis that could be contributing.  - provided cock up splint for both hands and to wear at night  - he will f/u 2-3. If still having significant pain, could try carpal tunnel injection vs x-ray of wrist and hands. Could perform a [place] evaluation of carpal tunnel but wouldn't likely be a surgical candidate.

## 2016-07-01 DIAGNOSIS — E161 Other hypoglycemia: Secondary | ICD-10-CM | POA: Diagnosis not present

## 2016-07-01 DIAGNOSIS — R7309 Other abnormal glucose: Secondary | ICD-10-CM | POA: Diagnosis not present

## 2016-07-14 ENCOUNTER — Ambulatory Visit: Payer: Medicare Other | Admitting: Family Medicine

## 2016-07-24 ENCOUNTER — Other Ambulatory Visit: Payer: Self-pay | Admitting: Endocrinology

## 2016-08-07 ENCOUNTER — Other Ambulatory Visit: Payer: Self-pay | Admitting: Endocrinology

## 2016-08-21 ENCOUNTER — Other Ambulatory Visit: Payer: Self-pay | Admitting: Endocrinology

## 2016-08-24 ENCOUNTER — Ambulatory Visit (INDEPENDENT_AMBULATORY_CARE_PROVIDER_SITE_OTHER): Payer: Medicare Other | Admitting: *Deleted

## 2016-08-24 ENCOUNTER — Telehealth: Payer: Self-pay | Admitting: Cardiology

## 2016-08-24 DIAGNOSIS — I442 Atrioventricular block, complete: Secondary | ICD-10-CM | POA: Diagnosis not present

## 2016-08-24 NOTE — Telephone Encounter (Signed)
Attempted to confirm remote transmission with pt. No answer and was unable to leave a message.   

## 2016-08-25 NOTE — Progress Notes (Signed)
Remote pacemaker transmission.   

## 2016-08-26 LAB — CUP PACEART REMOTE DEVICE CHECK
Battery Remaining Percentage: 95.5 %
Brady Statistic AP VP Percent: 87 %
Brady Statistic AS VP Percent: 12 %
Brady Statistic AS VS Percent: 1 %
Brady Statistic RA Percent Paced: 87 %
Implantable Lead Implant Date: 20170707
Implantable Lead Implant Date: 20170707
Implantable Lead Implant Date: 20170707
Implantable Lead Location: 753859
Lead Channel Impedance Value: 550 Ohm
Lead Channel Pacing Threshold Amplitude: 0.625 V
Lead Channel Pacing Threshold Pulse Width: 0.5 ms
Lead Channel Pacing Threshold Pulse Width: 0.7 ms
Lead Channel Sensing Intrinsic Amplitude: 1.2 mV
Lead Channel Setting Pacing Amplitude: 2.5 V
Lead Channel Setting Pacing Amplitude: 2.5 V
Lead Channel Setting Pacing Pulse Width: 0.7 ms
MDC IDC LEAD LOCATION: 753858
MDC IDC LEAD LOCATION: 753860
MDC IDC MSMT BATTERY REMAINING LONGEVITY: 83 mo
MDC IDC MSMT BATTERY VOLTAGE: 2.99 V
MDC IDC MSMT LEADCHNL LV IMPEDANCE VALUE: 790 Ohm
MDC IDC MSMT LEADCHNL LV PACING THRESHOLD AMPLITUDE: 1.5 V
MDC IDC MSMT LEADCHNL RA IMPEDANCE VALUE: 430 Ohm
MDC IDC MSMT LEADCHNL RA PACING THRESHOLD PULSEWIDTH: 0.5 ms
MDC IDC MSMT LEADCHNL RV PACING THRESHOLD AMPLITUDE: 0.5 V
MDC IDC MSMT LEADCHNL RV SENSING INTR AMPL: 11.3 mV
MDC IDC PG IMPLANT DT: 20170707
MDC IDC SESS DTM: 20180417211806
MDC IDC SET LEADCHNL RA PACING AMPLITUDE: 1.625
MDC IDC SET LEADCHNL RV PACING PULSEWIDTH: 0.4 ms
MDC IDC SET LEADCHNL RV SENSING SENSITIVITY: 5 mV
MDC IDC STAT BRADY AP VS PERCENT: 1 %
Pulse Gen Model: 3262
Pulse Gen Serial Number: 7918316

## 2016-08-27 ENCOUNTER — Encounter: Payer: Self-pay | Admitting: Cardiology

## 2016-09-01 ENCOUNTER — Other Ambulatory Visit (INDEPENDENT_AMBULATORY_CARE_PROVIDER_SITE_OTHER): Payer: Medicare Other

## 2016-09-01 DIAGNOSIS — E538 Deficiency of other specified B group vitamins: Secondary | ICD-10-CM

## 2016-09-01 DIAGNOSIS — E1165 Type 2 diabetes mellitus with hyperglycemia: Secondary | ICD-10-CM | POA: Diagnosis not present

## 2016-09-01 DIAGNOSIS — Z794 Long term (current) use of insulin: Secondary | ICD-10-CM

## 2016-09-01 LAB — COMPREHENSIVE METABOLIC PANEL
ALBUMIN: 3.8 g/dL (ref 3.5–5.2)
ALK PHOS: 87 U/L (ref 39–117)
ALT: 11 U/L (ref 0–53)
AST: 15 U/L (ref 0–37)
BUN: 20 mg/dL (ref 6–23)
CALCIUM: 9.4 mg/dL (ref 8.4–10.5)
CHLORIDE: 105 meq/L (ref 96–112)
CO2: 24 mEq/L (ref 19–32)
Creatinine, Ser: 1.23 mg/dL (ref 0.40–1.50)
GFR: 72.14 mL/min (ref >60.00)
Glucose, Bld: 165 mg/dL — ABNORMAL HIGH (ref 70–99)
POTASSIUM: 4.3 meq/L (ref 3.5–5.1)
Sodium: 138 mEq/L (ref 135–145)
TOTAL PROTEIN: 6.7 g/dL (ref 6.0–8.3)
Total Bilirubin: 0.7 mg/dL (ref 0.2–1.2)

## 2016-09-01 LAB — CBC
HEMATOCRIT: 39.7 % (ref 39.0–52.0)
Hemoglobin: 13.1 g/dL (ref 13.0–17.0)
MCHC: 33 g/dL (ref 30.0–36.0)
MCV: 97.4 fl (ref 78.0–100.0)
PLATELETS: 173 10*3/uL (ref 150.0–400.0)
RBC: 4.08 Mil/uL — AB (ref 4.22–5.81)
RDW: 14.8 % (ref 11.5–15.5)
WBC: 5 10*3/uL (ref 4.0–10.5)

## 2016-09-01 LAB — LIPID PANEL
CHOLESTEROL: 111 mg/dL (ref 0–200)
HDL: 41.6 mg/dL (ref >39.00)
LDL CALC: 61 mg/dL (ref 0–99)
NonHDL: 69.66
TRIGLYCERIDES: 42 mg/dL (ref 0.0–149.0)
Total CHOL/HDL Ratio: 3
VLDL: 8.4 mg/dL (ref 0.0–40.0)

## 2016-09-01 LAB — MICROALBUMIN / CREATININE URINE RATIO
CREATININE, U: 67 mg/dL
MICROALB UR: 14.6 mg/dL — AB (ref 0.0–1.9)
Microalb Creat Ratio: 21.8 mg/g (ref 0.0–30.0)

## 2016-09-01 LAB — HEMOGLOBIN A1C: Hgb A1c MFr Bld: 7.1 % — ABNORMAL HIGH (ref 4.6–6.5)

## 2016-09-03 ENCOUNTER — Ambulatory Visit (INDEPENDENT_AMBULATORY_CARE_PROVIDER_SITE_OTHER): Payer: Medicare Other | Admitting: Endocrinology

## 2016-09-03 ENCOUNTER — Encounter: Payer: Self-pay | Admitting: Endocrinology

## 2016-09-03 VITALS — BP 112/80 | HR 76 | Ht 69.0 in | Wt 175.0 lb

## 2016-09-03 DIAGNOSIS — E1165 Type 2 diabetes mellitus with hyperglycemia: Secondary | ICD-10-CM

## 2016-09-03 DIAGNOSIS — Z794 Long term (current) use of insulin: Secondary | ICD-10-CM

## 2016-09-03 DIAGNOSIS — R5383 Other fatigue: Secondary | ICD-10-CM

## 2016-09-03 NOTE — Patient Instructions (Addendum)
Check blood sugars on waking up 3x weekly   Also check blood sugars about 2 hours after a meal and do this after different meals by rotation  Recommended blood sugar levels on waking up is 90-130 and about 2 hours after meal is 130-160  Please bring your blood sugar monitor to each visit, thank you  Take Gabapentin only as needed for sharp leg pains

## 2016-09-03 NOTE — Progress Notes (Signed)
Patient ID: [name], male   DOB: 05/11/1930, 83 y.o.   MRN: [id number]   Reason for Appointment:  Follow-up of blood pressure and diabetes  History of Present Illness    HYPERTENSION:  this has been well controlled previously.   Because blood pressure was relatively low on the last visit his Coreg was reduced to half tablet in the morning, previously HCTZ and Avapro were stopped. He is also on low-dose Aldactone from cardiologist He is now here for blood pressure follow-up Does not feel weak or tired  Type 2 DIABETES MELITUS, long-standing     He has been on basal bolus regimen of insulin for several years with usually fair control  Recent history:  Insulin regimen: NovoLog 5 units a.c., Lantus 34  units at breakfast, uses syringes           Oral hypoglycemic drugs:   metformin ER 0.5g bid   His blood sugars have been recently much better controlled with A1c 7.1.  Previously 8.4  Current blood sugar patterns and problems identified:  He was told to take his LANTUS in the morning and is doing this regularly this time, may have been forgetting to do it on the previous visit at every night.  May also be doing better with mealtime insulin also, however not clear why he has some sporadic high readings midday and afternoon, more after breakfast/before lunch  He has a couple of readings around 500 which may be erroneous  No hypoglycemia with only one low normal reading of 66 around lunchtime  He is not able to recall well what he is eating for various meals  He is only mildly active, has limited mobility         Side effects from medications: diarrhea with 1.5g metformin Proper timing of medications in relation to meals: Yes.          Monitors blood glucose: 3- 4 times a day.    Glucometer:  One Touch   Mean values apply above for all meters except median for One Touch  PRE-MEAL Fasting Lunch Dinner Bedtime Overall  Glucose range:  80-214  66-508   1 25-510      Mean/median: 159  163   166+/-92    POST-MEAL PC Breakfast PC Lunch PC Dinner  Glucose range:   113, 223   Mean/median:        Meals: 3 meals per day.  Breakfast 8 AM, Lunch 1 pm, dinner 5 pm   BreakfastIs usually eggs, grits, juice; may have peanut butter crackers for snack at night          Physical activity: Minimal walking at home   Wt Readings from Last 3 Encounters:  09/03/16 175 lb (79.4 kg)  06/23/16 165 lb (74.8 kg)  06/17/16 180 lb 6 oz (81.8 kg)   Lab Results  Component Value Date   HGBA1C 7.1 (H) 09/01/2016   HGBA1C 8.4 (H) 05/21/2016   HGBA1C 6.5 03/23/2016   Lab Results  Component Value Date   MICROALBUR 14.6 (H) 09/01/2016   LDLCALC 61 09/01/2016   CREATININE 1.23 09/01/2016     Other active problems as in review of systems     Allergies as of 09/03/2016   No Known Allergies     Medication List       Accurate as of 09/03/16  3:38 PM. Always use your most recent med list.  aspirin 81 MG chewable tablet Chew 81 mg by mouth daily.   atorvastatin 40 MG tablet Commonly known as:  LIPITOR TAKE 1 TABLET (40 MG TOTAL) BY MOUTH DAILY.   calcium-vitamin D 500-200 MG-UNIT tablet Commonly known as:  OSCAL WITH D Take 1 tablet by mouth daily.   cholecalciferol 1000 units tablet Commonly known as:  VITAMIN D Take 1,000 Units by mouth daily.   cyanocobalamin 500 MCG tablet Take 500 mcg by mouth daily.   furosemide 20 MG tablet Commonly known as:  LASIX Take 1 tablet (20 mg total) by mouth daily.   gabapentin 300 MG capsule Commonly known as:  NEURONTIN TAKE 1 CAPSULE (300 MG TOTAL) BY MOUTH 3 (THREE) TIMES DAILY.   insulin aspart 100 UNIT/ML injection Commonly known as:  novoLOG Inject 5 Units into the skin 3 (three) times daily before meals.   NOVOLOG 100 UNIT/ML injection Generic drug:  insulin aspart INJECT 5 UNITS INTO THE SKIN 3 TIMES DAILY WITH MEALS   insulin glargine 100 UNIT/ML injection Commonly known as:   LANTUS Inject 0.2 mLs (20 Units total) into the skin at bedtime.   metFORMIN 500 MG 24 hr tablet Commonly known as:  GLUCOPHAGE-XR Take 2 tablets my mouth daily with supper   nitroGLYCERIN 0.4 MG SL tablet Commonly known as:  NITROSTAT Place 1 tablet (0.4 mg total) under the tongue every 5 (five) minutes as needed for chest pain.   ONETOUCH VERIO test strip Generic drug:  glucose blood USE TO TEST 4 TIMES DAILY   spironolactone 25 MG tablet Commonly known as:  ALDACTONE TAKE 1 TABLET (25 MG TOTAL) BY MOUTH DAILY.       Allergies: No Known Allergies  Past Medical History:  Diagnosis Date  . Cancer ([place])    hx of prostate ca  . Cataracts, bilateral    hx of  . CHF (congestive heart failure) ([place])   . Diabetes mellitus   . ED (erectile dysfunction)   . Hyperlipidemia   . Hypertension   . Neuromuscular disorder ([facility])    numbness in hand/cervical issues  . Pneumonia    hx of  . Syncope    hx of  . Urination frequency     Past Surgical History:  Procedure Laterality Date  . CARDIAC CATHETERIZATION N/A 11/13/2015   Procedure: Temporary Pacemaker;  Surgeon: Lorretta Harp, MD;  Location: [facility];  Service: Cardiovascular;  Laterality: N/A;  . EP IMPLANTABLE DEVICE N/A 11/14/2015   Procedure: BiV Pacemaker Insertion CRT-P;  Surgeon: Deboraha Sprang, MD;  Location: [facility];  Service: Cardiovascular;  Laterality: N/A;  . EYE SURGERY     cataract surgery bilateral  . HERNIA REPAIR    . POSTERIOR CERVICAL FUSION/FORAMINOTOMY  11/18/2011   Procedure: POSTERIOR CERVICAL FUSION/FORAMINOTOMY LEVEL 1;  Surgeon: Eustace Moore, MD;  Location: [facility];  Service: Neurosurgery;  Laterality: Bilateral;  . PROSTATE SURGERY     s/p ca  . SMALL INTESTINE SURGERY     hx of  . TONSILLECTOMY      Family History  Problem Relation Age of Onset  . Diabetes Mother   . Hypertension Father     Social History:  reports that he has never smoked. He has never used  smokeless tobacco. He reports that he does not drink alcohol or use drugs.  Review of Systems:    HYPERTENSION:    Has been on Aldactone 25 mg and Lasix 20 mg given by cardiologist  for history of heart failure No significant shortness of breath on activity  Electrolytes and renal function as follows:  Lab Results  Component Value Date   CREATININE 1.23 09/01/2016   BUN 20 09/01/2016   NA 138 09/01/2016   K 4.3 09/01/2016   CL 105 09/01/2016   CO2 24 09/01/2016      He has had mild B-12 deficiency, Taking take B12 supplements as directed No recent anemia However he does think he gets tired easily  Lab Results  Component Value Date   WBC 5.0 09/01/2016   HGB 13.1 09/01/2016   HCT 39.7 09/01/2016   MCV 97.4 09/01/2016   PLT 173.0 09/01/2016   Lab Results  Component Value Date   VITAMINB12 830 08/28/2015    Hyperlipidemia: Well controlled  with Lipitor 40 mg  Lab Results  Component Value Date   CHOL 111 09/01/2016   HDL 41.60 09/01/2016   LDLCALC 61 09/01/2016   LDLDIRECT 79.0 12/24/2013   TRIG 42.0 09/01/2016   CHOLHDL 3 09/01/2016    NEUROPATHY:  Had symptoms of tingling controlled With gabapentin He says he feels some tightness in his legs at times   See last foot exam: 2/17    Examination:   BP 112/80   Pulse 76   Ht 5\' 9"  (1.753 m)   Wt 175 lb (79.4 kg)   BMI 25.84 kg/m   Body mass index is 25.84 kg/m.    No lower leg edema  ASSESSMENT/ PLAN:   Diabetes type 2  See history of present illness for detailed discussion of current diabetes management, blood sugar patterns and problems identified  His A1c is better at 7.1 and may be benefiting from taking Lantus in the morning instead of night when he can remember it better However still has sporadic high readings midday based on his diet and also occasionally may possibly forget his NovoLog He is not able to recall what he is having for diet but has lost a little weight  For now will continue  his medications unchanged and insulin doses as before  Fatigue: We will check TSH on next visit Meanwhile he can try taking gabapentin only as needed in case this is making him slowed down  Counseling time on subjects discussed above is over 50% of today's 25 minute visit    Patient Instructions  Check blood sugars on waking up 3x weekly   Also check blood sugars about 2 hours after a meal and do this after different meals by rotation  Recommended blood sugar levels on waking up is 90-130 and about 2 hours after meal is 130-160  Please bring your blood sugar monitor to each visit, thank you  Take Gabapentin only as needed for sharp leg pains           Dakhari Zuver 09/03/2016, 3:38 PM

## 2016-09-04 ENCOUNTER — Other Ambulatory Visit: Payer: Self-pay | Admitting: Interventional Cardiology

## 2016-09-12 ENCOUNTER — Other Ambulatory Visit: Payer: Self-pay | Admitting: Interventional Cardiology

## 2016-09-14 NOTE — Telephone Encounter (Signed)
Med was stopped at hospital discharge as below:   Will file in chart as: carvedilol (COREG) 12.5 MG tablet The source prescription was discontinued on 11/18/2015 by Baldwin Jamaica, PA-C for the following reason: Stop Taking at Discharge.   Discharge Medications:    Medication List    STOP taking these medications        carvedilol 12.5 MG tablet  Commonly known as:  COREG

## 2016-10-10 ENCOUNTER — Other Ambulatory Visit: Payer: Self-pay | Admitting: Endocrinology

## 2016-10-14 ENCOUNTER — Encounter (HOSPITAL_COMMUNITY): Payer: Self-pay | Admitting: *Deleted

## 2016-10-14 ENCOUNTER — Inpatient Hospital Stay (HOSPITAL_COMMUNITY)
Admission: EM | Admit: 2016-10-14 | Discharge: 2016-10-18 | DRG: 092 | Disposition: A | Discharge status: Rehabilitation Facility | Payer: Medicare Other | Attending: Internal Medicine | Admitting: Internal Medicine

## 2016-10-14 ENCOUNTER — Emergency Department (HOSPITAL_COMMUNITY): Payer: Medicare Other

## 2016-10-14 DIAGNOSIS — I130 Hypertensive heart and chronic kidney disease with heart failure and stage 1 through stage 4 chronic kidney disease, or unspecified chronic kidney disease: Secondary | ICD-10-CM | POA: Diagnosis present

## 2016-10-14 DIAGNOSIS — R410 Disorientation, unspecified: Secondary | ICD-10-CM | POA: Diagnosis not present

## 2016-10-14 DIAGNOSIS — G92 Toxic encephalopathy: Secondary | ICD-10-CM | POA: Diagnosis not present

## 2016-10-14 DIAGNOSIS — I2510 Atherosclerotic heart disease of native coronary artery without angina pectoris: Secondary | ICD-10-CM | POA: Diagnosis present

## 2016-10-14 DIAGNOSIS — F0280 Dementia in other diseases classified elsewhere without behavioral disturbance: Secondary | ICD-10-CM | POA: Diagnosis not present

## 2016-10-14 DIAGNOSIS — Z923 Personal history of irradiation: Secondary | ICD-10-CM

## 2016-10-14 DIAGNOSIS — Z8546 Personal history of malignant neoplasm of prostate: Secondary | ICD-10-CM | POA: Diagnosis not present

## 2016-10-14 DIAGNOSIS — Z7982 Long term (current) use of aspirin: Secondary | ICD-10-CM | POA: Diagnosis not present

## 2016-10-14 DIAGNOSIS — R0602 Shortness of breath: Secondary | ICD-10-CM | POA: Diagnosis not present

## 2016-10-14 DIAGNOSIS — W57XXXA Bitten or stung by nonvenomous insect and other nonvenomous arthropods, initial encounter: Secondary | ICD-10-CM | POA: Diagnosis not present

## 2016-10-14 DIAGNOSIS — Z950 Presence of cardiac pacemaker: Secondary | ICD-10-CM | POA: Diagnosis not present

## 2016-10-14 DIAGNOSIS — R5381 Other malaise: Secondary | ICD-10-CM | POA: Diagnosis present

## 2016-10-14 DIAGNOSIS — R630 Anorexia: Secondary | ICD-10-CM | POA: Diagnosis present

## 2016-10-14 DIAGNOSIS — R0902 Hypoxemia: Secondary | ICD-10-CM | POA: Diagnosis not present

## 2016-10-14 DIAGNOSIS — E119 Type 2 diabetes mellitus without complications: Secondary | ICD-10-CM | POA: Diagnosis not present

## 2016-10-14 DIAGNOSIS — E1142 Type 2 diabetes mellitus with diabetic polyneuropathy: Secondary | ICD-10-CM | POA: Diagnosis present

## 2016-10-14 DIAGNOSIS — S80862A Insect bite (nonvenomous), left lower leg, initial encounter: Secondary | ICD-10-CM | POA: Diagnosis not present

## 2016-10-14 DIAGNOSIS — N183 Chronic kidney disease, stage 3 unspecified: Secondary | ICD-10-CM

## 2016-10-14 DIAGNOSIS — E1151 Type 2 diabetes mellitus with diabetic peripheral angiopathy without gangrene: Secondary | ICD-10-CM | POA: Diagnosis present

## 2016-10-14 DIAGNOSIS — E440 Moderate protein-calorie malnutrition: Secondary | ICD-10-CM | POA: Diagnosis present

## 2016-10-14 DIAGNOSIS — Z66 Do not resuscitate: Secondary | ICD-10-CM | POA: Diagnosis not present

## 2016-10-14 DIAGNOSIS — G9340 Encephalopathy, unspecified: Secondary | ICD-10-CM | POA: Diagnosis present

## 2016-10-14 DIAGNOSIS — Z8249 Family history of ischemic heart disease and other diseases of the circulatory system: Secondary | ICD-10-CM

## 2016-10-14 DIAGNOSIS — F0390 Unspecified dementia without behavioral disturbance: Secondary | ICD-10-CM

## 2016-10-14 DIAGNOSIS — E871 Hypo-osmolality and hyponatremia: Secondary | ICD-10-CM | POA: Diagnosis present

## 2016-10-14 DIAGNOSIS — R4182 Altered mental status, unspecified: Secondary | ICD-10-CM | POA: Diagnosis not present

## 2016-10-14 DIAGNOSIS — Z7189 Other specified counseling: Secondary | ICD-10-CM | POA: Diagnosis not present

## 2016-10-14 DIAGNOSIS — F0391 Unspecified dementia with behavioral disturbance: Secondary | ICD-10-CM

## 2016-10-14 DIAGNOSIS — E1122 Type 2 diabetes mellitus with diabetic chronic kidney disease: Secondary | ICD-10-CM | POA: Diagnosis present

## 2016-10-14 DIAGNOSIS — Z794 Long term (current) use of insulin: Secondary | ICD-10-CM | POA: Diagnosis not present

## 2016-10-14 DIAGNOSIS — Z9841 Cataract extraction status, right eye: Secondary | ICD-10-CM | POA: Diagnosis not present

## 2016-10-14 DIAGNOSIS — Z6825 Body mass index (BMI) 25.0-25.9, adult: Secondary | ICD-10-CM

## 2016-10-14 DIAGNOSIS — E785 Hyperlipidemia, unspecified: Secondary | ICD-10-CM | POA: Diagnosis present

## 2016-10-14 DIAGNOSIS — I5022 Chronic systolic (congestive) heart failure: Secondary | ICD-10-CM | POA: Diagnosis not present

## 2016-10-14 DIAGNOSIS — G309 Alzheimer's disease, unspecified: Secondary | ICD-10-CM | POA: Diagnosis present

## 2016-10-14 DIAGNOSIS — I639 Cerebral infarction, unspecified: Secondary | ICD-10-CM | POA: Diagnosis not present

## 2016-10-14 DIAGNOSIS — E1165 Type 2 diabetes mellitus with hyperglycemia: Secondary | ICD-10-CM | POA: Diagnosis not present

## 2016-10-14 DIAGNOSIS — I10 Essential (primary) hypertension: Secondary | ICD-10-CM | POA: Diagnosis not present

## 2016-10-14 DIAGNOSIS — I447 Left bundle-branch block, unspecified: Secondary | ICD-10-CM | POA: Diagnosis present

## 2016-10-14 DIAGNOSIS — Z79899 Other long term (current) drug therapy: Secondary | ICD-10-CM | POA: Diagnosis not present

## 2016-10-14 DIAGNOSIS — Z833 Family history of diabetes mellitus: Secondary | ICD-10-CM | POA: Diagnosis not present

## 2016-10-14 DIAGNOSIS — R627 Adult failure to thrive: Secondary | ICD-10-CM | POA: Diagnosis present

## 2016-10-14 DIAGNOSIS — Z981 Arthrodesis status: Secondary | ICD-10-CM | POA: Diagnosis not present

## 2016-10-14 DIAGNOSIS — R1312 Dysphagia, oropharyngeal phase: Secondary | ICD-10-CM | POA: Diagnosis not present

## 2016-10-14 DIAGNOSIS — I679 Cerebrovascular disease, unspecified: Secondary | ICD-10-CM | POA: Diagnosis not present

## 2016-10-14 DIAGNOSIS — Z9842 Cataract extraction status, left eye: Secondary | ICD-10-CM | POA: Diagnosis not present

## 2016-10-14 DIAGNOSIS — L602 Onychogryphosis: Secondary | ICD-10-CM | POA: Diagnosis present

## 2016-10-14 DIAGNOSIS — I63441 Cerebral infarction due to embolism of right cerebellar artery: Secondary | ICD-10-CM | POA: Diagnosis not present

## 2016-10-14 DIAGNOSIS — E872 Acidosis: Secondary | ICD-10-CM | POA: Diagnosis present

## 2016-10-14 DIAGNOSIS — R404 Transient alteration of awareness: Secondary | ICD-10-CM | POA: Diagnosis not present

## 2016-10-14 DIAGNOSIS — I69398 Other sequelae of cerebral infarction: Secondary | ICD-10-CM | POA: Diagnosis present

## 2016-10-14 DIAGNOSIS — R262 Difficulty in walking, not elsewhere classified: Secondary | ICD-10-CM | POA: Diagnosis present

## 2016-10-14 DIAGNOSIS — T383X5A Adverse effect of insulin and oral hypoglycemic [antidiabetic] drugs, initial encounter: Secondary | ICD-10-CM | POA: Diagnosis present

## 2016-10-14 DIAGNOSIS — F0150 Vascular dementia without behavioral disturbance: Secondary | ICD-10-CM | POA: Diagnosis not present

## 2016-10-14 DIAGNOSIS — Z515 Encounter for palliative care: Secondary | ICD-10-CM | POA: Diagnosis not present

## 2016-10-14 DIAGNOSIS — I6789 Other cerebrovascular disease: Secondary | ICD-10-CM | POA: Diagnosis not present

## 2016-10-14 DIAGNOSIS — I5042 Chronic combined systolic (congestive) and diastolic (congestive) heart failure: Secondary | ICD-10-CM | POA: Diagnosis present

## 2016-10-14 DIAGNOSIS — S80869A Insect bite (nonvenomous), unspecified lower leg, initial encounter: Secondary | ICD-10-CM | POA: Diagnosis not present

## 2016-10-14 DIAGNOSIS — E1149 Type 2 diabetes mellitus with other diabetic neurological complication: Secondary | ICD-10-CM | POA: Diagnosis not present

## 2016-10-14 DIAGNOSIS — I6523 Occlusion and stenosis of bilateral carotid arteries: Secondary | ICD-10-CM | POA: Diagnosis not present

## 2016-10-14 DIAGNOSIS — E870 Hyperosmolality and hypernatremia: Secondary | ICD-10-CM | POA: Diagnosis not present

## 2016-10-14 LAB — RAPID URINE DRUG SCREEN, HOSP PERFORMED
AMPHETAMINES: NOT DETECTED
BARBITURATES: NOT DETECTED
BENZODIAZEPINES: NOT DETECTED
COCAINE: NOT DETECTED
Opiates: NOT DETECTED
TETRAHYDROCANNABINOL: NOT DETECTED

## 2016-10-14 LAB — URINALYSIS, ROUTINE W REFLEX MICROSCOPIC
Bilirubin Urine: NEGATIVE
Glucose, UA: NEGATIVE mg/dL
Ketones, ur: 20 mg/dL — AB
Leukocytes, UA: NEGATIVE
NITRITE: NEGATIVE
PH: 5 (ref 5.0–8.0)
Protein, ur: >=300 mg/dL — AB
Specific Gravity, Urine: 1.018 (ref 1.005–1.030)

## 2016-10-14 LAB — CBC WITH DIFFERENTIAL/PLATELET
BASOS PCT: 0 %
Basophils Absolute: 0 10*3/uL (ref 0.0–0.1)
EOS PCT: 0 %
Eosinophils Absolute: 0 10*3/uL (ref 0.0–0.7)
HEMATOCRIT: 42.4 % (ref 39.0–52.0)
Hemoglobin: 14.3 g/dL (ref 13.0–17.0)
LYMPHS PCT: 8 %
Lymphs Abs: 0.7 10*3/uL (ref 0.7–4.0)
MCH: 31.7 pg (ref 26.0–34.0)
MCHC: 33.7 g/dL (ref 30.0–36.0)
MCV: 94 fL (ref 78.0–100.0)
Monocytes Absolute: 0.5 10*3/uL (ref 0.1–1.0)
Monocytes Relative: 6 %
NEUTROS ABS: 7.5 10*3/uL (ref 1.7–7.7)
Neutrophils Relative %: 86 %
PLATELETS: 182 10*3/uL (ref 150–400)
RBC: 4.51 MIL/uL (ref 4.22–5.81)
RDW: 14.5 % (ref 11.5–15.5)
WBC: 8.8 10*3/uL (ref 4.0–10.5)

## 2016-10-14 LAB — I-STAT CHEM 8, ED
BUN: 20 mg/dL (ref 6–20)
CHLORIDE: 106 mmol/L (ref 101–111)
CREATININE: 1.1 mg/dL (ref 0.61–1.24)
Calcium, Ion: 1.12 mmol/L — ABNORMAL LOW (ref 1.15–1.40)
Glucose, Bld: 178 mg/dL — ABNORMAL HIGH (ref 65–99)
HEMATOCRIT: 49 % (ref 39.0–52.0)
Hemoglobin: 16.7 g/dL (ref 13.0–17.0)
POTASSIUM: 4.5 mmol/L (ref 3.5–5.1)
SODIUM: 141 mmol/L (ref 135–145)
TCO2: 20 mmol/L (ref 0–100)

## 2016-10-14 LAB — COMPREHENSIVE METABOLIC PANEL
ALBUMIN: 3.8 g/dL (ref 3.5–5.0)
ALK PHOS: 110 U/L (ref 38–126)
ALT: 16 U/L — AB (ref 17–63)
AST: 25 U/L (ref 15–41)
Anion gap: 16 — ABNORMAL HIGH (ref 5–15)
BUN: 18 mg/dL (ref 6–20)
CO2: 20 mmol/L — AB (ref 22–32)
CREATININE: 1.32 mg/dL — AB (ref 0.61–1.24)
Calcium: 9.4 mg/dL (ref 8.9–10.3)
Chloride: 103 mmol/L (ref 101–111)
GFR calc Af Amer: 56 mL/min — ABNORMAL LOW (ref >60)
GFR calc non Af Amer: 48 mL/min — ABNORMAL LOW (ref >60)
GLUCOSE: 176 mg/dL — AB (ref 65–99)
Potassium: 4.6 mmol/L (ref 3.5–5.1)
SODIUM: 139 mmol/L (ref 135–145)
TOTAL PROTEIN: 6.9 g/dL (ref 6.5–8.1)
Total Bilirubin: 1.5 mg/dL — ABNORMAL HIGH (ref 0.3–1.2)

## 2016-10-14 LAB — I-STAT TROPONIN, ED: Troponin i, poc: 0.16 ng/mL (ref 0.00–0.08)

## 2016-10-14 LAB — I-STAT CG4 LACTIC ACID, ED: LACTIC ACID, VENOUS: 2.49 mmol/L — AB (ref 0.5–1.9)

## 2016-10-14 LAB — ETHANOL: Alcohol, Ethyl (B): <5 mg/dL (ref <5)

## 2016-10-14 MED ORDER — DOXYCYCLINE HYCLATE 100 MG PO TABS
100.0000 mg | ORAL_TABLET | Freq: Two times a day (BID) | ORAL | Status: DC
  Administered 2016-10-14 – 2016-10-18 (×8): 100 mg via ORAL
  Filled 2016-10-14 (×8): qty 1

## 2016-10-14 MED ORDER — SODIUM CHLORIDE 0.9 % IV SOLN
INTRAVENOUS | Status: DC
  Administered 2016-10-14 – 2016-10-15 (×3): via INTRAVENOUS

## 2016-10-14 MED ORDER — STROKE: EARLY STAGES OF RECOVERY BOOK
Freq: Once | Status: AC
  Administered 2016-10-14: 22:00:00
  Filled 2016-10-14: qty 1

## 2016-10-14 MED ORDER — ACETAMINOPHEN 160 MG/5ML PO SOLN: 650.0000 mg | ORAL | Status: DC | PRN

## 2016-10-14 MED ORDER — ACETAMINOPHEN 650 MG RE SUPP: 650.0000 mg | RECTAL | Status: DC | PRN

## 2016-10-14 MED ORDER — ACETAMINOPHEN 325 MG PO TABS
650.0000 mg | ORAL_TABLET | ORAL | Status: DC | PRN
  Administered 2016-10-17: 650 mg via ORAL
  Filled 2016-10-14: qty 2

## 2016-10-14 MED ORDER — ENOXAPARIN SODIUM 30 MG/0.3ML ~~LOC~~ SOLN
30.0000 mg | SUBCUTANEOUS | Status: DC
  Administered 2016-10-14 – 2016-10-17 (×4): 30 mg via SUBCUTANEOUS
  Filled 2016-10-14 (×4): qty 0.3

## 2016-10-14 NOTE — ED Notes (Signed)
Called phlebotomy unable to draw PT INR, neurology alerted and stated it is a s

## 2016-10-14 NOTE — Consult Note (Signed)
Neurology Consult Note  Reason for Consultation: Altered mental status  Requesting provider: Quintella Reichert, MD  CC: Patient is without complaints  HPI: This is an 83 year old man brought to the emergency room for evaluation of altered mental status. History is limited to review the patient's medical record. He is unable to provide much in the way of useful information. No family is present at the bedside.  The patient reports that he is fine. He has no complaints. He is unsure where he is or why he is here. He tells me that he went to [place] this morning to have breakfast and wants to know if "that gal" is coming back.  According to notes, the patient was found confused by his daughter at about 12:00 today. He was last seen to be normal at around 1800 yesterday. She woke up at around 12:00 today and looking for her father. She found him in bed, which she reported was unusual because he normally gets up early and is doing things around the house. He has been encephalopathic in the ED without focal deficits according to the consult and provider. There is mention that a tick was found on the patient in the ED but no more specific information is provided. On discussion with his nurse, she says that she is unsure if the tick was attached or if it was just found crawling on the patient.  PMH:  Past Medical History:  Diagnosis Date  . Cancer ([place])    hx of prostate ca  . Cataracts, bilateral    hx of  . CHF (congestive heart failure) ([place])   . Diabetes mellitus   . ED (erectile dysfunction)   . Hyperlipidemia   . Hypertension   . Neuromuscular disorder (HCC)    numbness in hand/cervical issues  . Pneumonia    hx of  . Syncope    hx of  . Urination frequency     PSH:  Past Surgical History:  Procedure Laterality Date  . CARDIAC CATHETERIZATION N/A 11/13/2015   Procedure: Temporary Pacemaker;  Surgeon: Lorretta Harp, MD;  Location: [facility];  Service: Cardiovascular;   Laterality: N/A;  . EP IMPLANTABLE DEVICE N/A 11/14/2015   Procedure: BiV Pacemaker Insertion CRT-P;  Surgeon: Deboraha Sprang, MD;  Location: [facility];  Service: Cardiovascular;  Laterality: N/A;  . EYE SURGERY     cataract surgery bilateral  . HERNIA REPAIR    . POSTERIOR CERVICAL FUSION/FORAMINOTOMY  11/18/2011   Procedure: POSTERIOR CERVICAL FUSION/FORAMINOTOMY LEVEL 1;  Surgeon: Eustace Moore, MD;  Location: [facility];  Service: Neurosurgery;  Laterality: Bilateral;  . PROSTATE SURGERY     s/p ca  . SMALL INTESTINE SURGERY     hx of  . TONSILLECTOMY      Family history: Family History  Problem Relation Age of Onset  . Diabetes Mother   . Hypertension Father     Social history:  Social History   Social History  . Marital status: Widowed    Spouse name: N/A  . Number of children: N/A  . Years of education: N/A   Occupational History  . retired     Actuary   Social History Main Topics  . Smoking status: Never Smoker  . Smokeless tobacco: Never Used  . Alcohol use No     Comment: daughter states that used to be "heavy weekend drinker" but quit 10 + years ago  . Drug use: No  . Sexual  activity: Not Currently   Other Topics Concern  . Not on file   Social History Narrative  . No narrative on file   Current inpatient meds: Medications reviewed and reconciled.  No current facility-administered medications for this encounter.    Current Outpatient Prescriptions  Medication Sig Dispense Refill  . aspirin 81 MG chewable tablet Chew 81 mg by mouth daily.    Marland Kitchen atorvastatin (LIPITOR) 40 MG tablet TAKE 1 TABLET (40 MG TOTAL) BY MOUTH DAILY. 90 tablet 1  . calcium-vitamin D (OSCAL WITH D) 500-200 MG-UNIT per tablet Take 1 tablet by mouth daily.    . cholecalciferol (VITAMIN D) 1000 UNITS tablet Take 1,000 Units by mouth daily.    . cyanocobalamin 500 MCG tablet Take 500 mcg by mouth daily.    . furosemide (LASIX) 20 MG tablet Take 1  tablet (20 mg total) by mouth daily. 90 tablet 3  . gabapentin (NEURONTIN) 300 MG capsule TAKE 1 CAPSULE (300 MG TOTAL) BY MOUTH 3 (THREE) TIMES DAILY. 270 capsule 1  . insulin aspart (NOVOLOG) 100 UNIT/ML injection Inject 5 Units into the skin 3 (three) times daily before meals.    . insulin glargine (LANTUS) 100 UNIT/ML injection Inject 0.2 mLs (20 Units total) into the skin at bedtime. (Patient taking differently: Inject 34 Units into the skin at bedtime. ) 10 mL 11  . metFORMIN (GLUCOPHAGE-XR) 500 MG 24 hr tablet Take 2 tablets my mouth daily with supper 180 tablet 2  . nitroGLYCERIN (NITROSTAT) 0.4 MG SL tablet Place 1 tablet (0.4 mg total) under the tongue every 5 (five) minutes as needed for chest pain. 25 tablet 3  . NOVOLOG 100 UNIT/ML injection INJECT 5 UNITS INTO THE SKIN 3 TIMES DAILY WITH MEALS 20 mL 0  . ONETOUCH VERIO test strip USE TO TEST 4 TIMES DAILY 200 each 2  . spironolactone (ALDACTONE) 25 MG tablet TAKE 1 TABLET (25 MG TOTAL) BY MOUTH DAILY. 30 tablet 2    Allergies: No Known Allergies  ROS: As per HPI. A full 14-point review of systems was performed and is otherwise unremarkable.   PE:  BP (!) 147/93   Pulse 73   Temp 99.1 F (37.3 C)   Resp 18   SpO2 100%   General: WD elderly African-American man sitting on ED gurney. He is in no acute distress. He is alert, oriented to self only. He has slight dysarthria but speech is intelligible. No aphasia. Follows commands briskly. Affect is bright with congruent mood. Comportment is normal. He is able to recite the months of the year forwards but not backwards. He has difficulty counting backwards from 20-1. Sustained attention is mildly impaired. He is somewhat stimulus-bound. HEENT: Normocephalic. Neck supple without LAD. MMM, OP clear. Sclerae anicteric. No conjunctival injection.  CV: Regular, no murmur. Pacer in place on her left chest wall. Carotid pulses full and symmetric, no bruits. Distal pulses 2+ and symmetric.   Lungs: CTAB.  Abdomen: Soft, non-distended, non-tender. Bowel sounds present x4.  Extremities: No C/C/E. Neuro:  CN: Pupils are equal and round. They are symmetrically reactive from 3-->2 mm. visual fields appear full. EOMI without nystagmus. No reported diplopia. He has breakup of smooth pursuit in all directions of gaze. Facial sensation is intact to light touch. Face is symmetric at rest with normal strength and mobility. Hearing is intact to conversational voice. Palate elevates symmetrically and uvula is midline. Voice is normal in tone, pitch and quality. Bilateral SCM and trapezii are 5/5. Tongue is  midline with normal bulk and mobility.  Motor: Normal bulk. He has mitgehen paratonia with normal underlying tone. Strength is normal. He has a mild intention tremor in both hands. No drift.  Sensation: Intact to light touch.  DTRs: 3+, symmetric. Toes mute bilaterally. Several frontal release signs are present including bilateral automatic grasp reflexes, bilateral palmomentals, glabellar, and snout.  Coordination: Finger-to-nose is slow but without dysmetria. Finger taps are normal in amplitude and speed, no decrement.    Labs:  Lab Results  Component Value Date   WBC 8.8 10/14/2016   HGB 16.7 10/14/2016   HCT 49.0 10/14/2016   PLT 182 10/14/2016   GLUCOSE 178 (H) 10/14/2016   CHOL 111 09/01/2016   TRIG 42.0 09/01/2016   HDL 41.60 09/01/2016   LDLDIRECT 79.0 12/24/2013   LDLCALC 61 09/01/2016   ALT 16 (L) 10/14/2016   AST 25 10/14/2016   NA 141 10/14/2016   K 4.5 10/14/2016   CL 106 10/14/2016   CREATININE 1.10 10/14/2016   BUN 20 10/14/2016   CO2 20 (L) 10/14/2016   TSH 2.380 11/13/2015   INR 1.07 11/18/2011   HGBA1C 7.1 (H) 09/01/2016   MICROALBUR 14.6 (H) 09/01/2016   Serum ethanol less than 5 Urinalysis notable for small hemoglobin, ketones 20, protein greater than 300 Urine drug screen negative Troponin 0.16 Lactate 2.49  Imaging:  I have personally and  independently reviewed the CT scan of the head without contrast from today. There is a moderate to severe burden of chronic small vessel ischemic disease involving the bihemispheric white matter. Moderate diffuse generalized atrophy is present. There is ex vacuo dilatation of the ventricles. No acute abnormality is appreciated.  Assessment and Plan:  1. Acute encephalopathy: The etiology of his presentation remains uncertain at this time. There is some concern on behalf of the ED staff that this could represent stroke. While this is possible, he has no lateralizing or focal deficits on his examination which would make stroke somewhat less likely. He has elevated lactate and troponin which would suggest alternate etiologies for his encephalopathy and will defer further workup of these abnormalities to the admitting hospitalist. Other labs thus far do not suggest any significant metabolic derangements. Urinalysis does not demonstrate any evidence of UTI. He does have a history of dementia resulting in reduced cerebral reserve. This places him at increased risk for encephalopathy and delirium from all causes, even seemingly trivial ones. This would also predict delayed recovery from encephalopathy. Optimize metabolic status as needed. Continue to treat any underlying infection if identified. Minimize the use of opiates, benzos or any medication with strong anticholinergic properties as much as possible. Optimize sleep-wake cycles as much as possbile by keeping the room bright with activity during the day and dark and quiet at night. Could consider MRI scan of the brain though he does have a pacemaker; this, in addition to his encephalopathy, could make getting a successful MRI scan challenging and I would not want to sedate the patient solely for the purpose of the scan unless other compelling indications warranted it. This is assuming his device is MRI-compatible. I'm not sure where the reported discovery of a tick  fits in with this, if at all.  2. Dementia: He carries a diagnosis of unspecified dementia. He was seen by Dr. Wells Guiles Tat at [facility] neurology on 10/02/15. [place] cognitive assessment was performed at that time with an adjusted score of 18/30, suggesting moderate to severe cognitive impairment. At that time, patient and family refused  further workup, including formal neuropsychological testing and MRI scan of the brain. No medication was initiated as Dr. Carles Collet wanted to defer treatment until testing could be completed. It is unclear to me at this time precisely what his cognitive baseline is right now. His dementia certainly places him at risk for delirium and encephalopathy from all causes, as noted above. It also would predict delayed and possibly incomplete recovery from same.   3. Cerebrovascular disease: CT scan of the head shows significant chronic small vessel ischemic disease. No obvious acute abnormality is seen. Known risk factors for cerebrovascular disease include diabetes, hyperlipidemia, hypertension, and age. Continue risk factor modification. Continue aspirin and statin.  No family was present at the time of my visit.

## 2016-10-14 NOTE — ED Notes (Signed)
I stat lactic acid results reported to Dr. Ralene Bathe by B. Yolanda Bonine, EMT

## 2016-10-14 NOTE — ED Triage Notes (Signed)
Pt arrives from home via [facility]. Pt was LSN last night around 1800. Pt daughter woke up at 1200 today and found her father still in bed. Daughter states he is normally up early and is doing tasks around the house. Daughter states pt is a&o x4 at baseline.

## 2016-10-14 NOTE — H&P (Signed)
[name] DXI:[id number] DOB: April 25, 1931 DOA: 10/14/2016  Referring physician: Phylliss Bob PA PCP: Elayne Snare, MD  Specialists:  Primary Care Physician: Elayne Snare, MD Primary Cardiologist: Dr. Tamala Julian Electrophysiologist: Dr. Caryl Comes Neuro: Dr. Carles Collet   Chief Complaint: stroke  HPI:   83 y/o ? community dweller H/o CHB s/p St Jude PPm 11/2015  LBBB DM ty II last J6B 7.1 complicated by peripheral neuropathy and onychogryphosis CKD stg I chf systolic Ef 34-19% 07/7900 htn Prostate cancer with XRT Prior h/o syncope with admission 2008 Cervical stenosis + myelopathy s/p CL/PCK C7-T1 11/20/11 Carpakl tunnel synd Alzhemier's   Dog-tick was on the patient on admission and removed by  Ms Phylliss Bob from L thigh outer medial aspect. According to Daughter-this pm-daughter awoke ~ 12:00 pm--usually is awake ~ 060:00 He wasn't responsive to daughter-he wasn't answering his daughter's questions when he usually.  He usually is up and about Usually awake between 5 and 6 , shaves, goes to [place] q am, gets breakfast, comes home sits around the house--takes meds, eats lunch and dinner Usually is not very confused-every so often-he forgets some things--normal conversations most of the time.   He occ forgets day, and sometimes repeats questions Still driving Gets round with walker and is pretty stable-no falls recently but has had some before when forgetting to use walker Daughter has lived c him since "all her life"--10 years  According to EMS, ED notes patient was last seen normal 10/13/16 ubp until bedtime 18:30 Awoke confused-previously is reasonably high functioning Per ED report vacillating levels of orientation-on admission patient was only monosyllables however now is talking in full sentences although is not oriented to time or place. He thinks that he is at home at his kitchen, thinks that he is living in [place] when I asked the [place]. Thinks it's 1997 and nose however that the  president is Daisy Floro He is thirsty He cannot give any other reliable information He tells me a scar on his abdomen is from surgery-ask him if this is prostate surgery. He replies no and then he tells me that he had prostate cancer in the next sentence   Labs on admit slight  ? bun/creat 20/1.2-->18/1.3 Troponin 0.16 Lactic acid 2.4 CT scan showing subacute infarct on scan Chest x-ray shows cardiomegaly with mild vascular congestion EKG shows AV dual paced rhythm at around 60 without any further interpretation possible  Review system is limited by level 5-Caveat  Past Medical History:  Diagnosis Date  . Cancer ([place])    hx of prostate ca  . Cataracts, bilateral    hx of  . CHF (congestive heart failure) ([place])   . Diabetes mellitus   . ED (erectile dysfunction)   . Hyperlipidemia   . Hypertension   . Neuromuscular disorder (HCC)    numbness in hand/cervical issues  . Pneumonia    hx of  . Syncope    hx of  . Urination frequency    Past Surgical History:  Procedure Laterality Date  . CARDIAC CATHETERIZATION N/A 11/13/2015   Procedure: Temporary Pacemaker;  Surgeon: Lorretta Harp, MD;  Location: [place] [facility];  Service: Cardiovascular;  Laterality: N/A;  . EP IMPLANTABLE DEVICE N/A 11/14/2015   Procedure: BiV Pacemaker Insertion CRT-P;  Surgeon: Deboraha Sprang, MD;  Location: [facility];  Service: Cardiovascular;  Laterality: N/A;  . EYE SURGERY     cataract surgery bilateral  . HERNIA REPAIR    . POSTERIOR CERVICAL FUSION/FORAMINOTOMY  11/18/2011   Procedure: POSTERIOR CERVICAL FUSION/FORAMINOTOMY LEVEL 1;  Surgeon: Eustace Moore, MD;  Location: [facility];  Service: Neurosurgery;  Laterality: Bilateral;  . PROSTATE SURGERY     s/p ca  . SMALL INTESTINE SURGERY     hx of  . TONSILLECTOMY     Social History:  reports that he has never smoked. He has never used smokeless tobacco. He reports that he does not drink alcohol or use drugs. Patient lives at  home with family No Known Allergies  Family History  Problem Relation Age of Onset  . Diabetes Mother   . Hypertension Father    Unable to further clarify given confusion  Prior to Admission medications   Medication Sig Start Date End Date Taking? Authorizing Provider  aspirin 81 MG chewable tablet Chew 81 mg by mouth daily.    [provider]  atorvastatin (LIPITOR) 40 MG tablet TAKE 1 TABLET (40 MG TOTAL) BY MOUTH DAILY. 11/25/15   Angiulli, Lavon Paganini, PA-C  calcium-vitamin D (OSCAL WITH D) 500-200 MG-UNIT per tablet Take 1 tablet by mouth daily.    [provider]  cholecalciferol (VITAMIN D) 1000 UNITS tablet Take 1,000 Units by mouth daily.    [provider]  cyanocobalamin 500 MCG tablet Take 500 mcg by mouth daily.    [provider]  furosemide (LASIX) 20 MG tablet Take 1 tablet (20 mg total) by mouth daily. 04/22/16   Elayne Snare, MD  gabapentin (NEURONTIN) 300 MG capsule TAKE 1 CAPSULE (300 MG TOTAL) BY MOUTH 3 (THREE) TIMES DAILY. 11/25/15   Angiulli, Lavon Paganini, PA-C  insulin aspart (NOVOLOG) 100 UNIT/ML injection Inject 5 Units into the skin 3 (three) times daily before meals.    [provider]  insulin glargine (LANTUS) 100 UNIT/ML injection Inject 0.2 mLs (20 Units total) into the skin at bedtime. Patient taking differently: Inject 34 Units into the skin at bedtime.  11/25/15   Angiulli, Lavon Paganini, PA-C  metFORMIN (GLUCOPHAGE-XR) 500 MG 24 hr tablet Take 2 tablets my mouth daily with supper 04/21/16   Elayne Snare, MD  nitroGLYCERIN (NITROSTAT) 0.4 MG SL tablet Place 1 tablet (0.4 mg total) under the tongue every 5 (five) minutes as needed for chest pain. 06/05/14   Belva Crome, MD  NOVOLOG 100 UNIT/ML injection INJECT 5 UNITS INTO THE SKIN 3 TIMES DAILY WITH MEALS 08/23/16   Elayne Snare, MD  [facility] VERIO test strip USE TO TEST 4 TIMES DAILY 08/09/16   Elayne Snare, MD  spironolactone (ALDACTONE) 25 MG tablet TAKE 1 TABLET (25 MG TOTAL)  BY MOUTH DAILY. 07/25/16   Elayne Snare, MD   Physical Exam: Vitals:   10/14/16 1500 10/14/16 1515  BP: (!) 143/90 (!) 143/85  Pulse: 83 78  Resp: (!) 21 20  Temp:      EOMI Arcus senilis Atarax mild pallor pterygium right eye Edentulous upper poor dentition lower No JVD S1-S2 no murmur rub or gallop, cannot appreciate any bruit Abdomen soft, midline lower scar up to umbilicus no rebound no mass Chest is clinically clear Lower extremities are soft mild onychogryphosis No edema Neurologic the power is intact 5/5 upper motor and lower extremity motor is normal lungs Sensory is intact Reflexes 2/3 Coordination not able to be tested, nor finger nose finger   Labs on Admission:  Basic Metabolic Panel:  Recent Labs Lab 10/14/16 1351 10/14/16 1444  NA 139 141  K 4.6 4.5  CL 103 106  CO2 20*  --  GLUCOSE 176* 178*  BUN 18 20  CREATININE 1.32* 1.10  CALCIUM 9.4  --    Liver Function Tests:  Recent Labs Lab 10/14/16 1351  AST 25  ALT 16*  ALKPHOS 110  BILITOT 1.5*  PROT 6.9  ALBUMIN 3.8   No results for input(s): LIPASE, AMYLASE in the last 168 hours. No results for input(s): AMMONIA in the last 168 hours. CBC:  Recent Labs Lab 10/14/16 1351 10/14/16 1444  WBC 8.8  --   NEUTROABS 7.5  --   HGB 14.3 16.7  HCT 42.4 49.0  MCV 94.0  --   PLT 182  --    Cardiac Enzymes: No results for input(s): CKTOTAL, CKMB, CKMBINDEX, TROPONINI in the last 168 hours.  BNP (last 3 results) No results for input(s): BNP in the last 8760 hours.  ProBNP (last 3 results) No results for input(s): PROBNP in the last 8760 hours.  CBG: No results for input(s): GLUCAP in the last 168 hours.  Radiological Exams on Admission: Ct Head Wo Contrast  Result Date: 10/14/2016 CLINICAL DATA:  Altered mental status EXAM: CT HEAD WITHOUT CONTRAST TECHNIQUE: Contiguous axial images were obtained from the base of the skull through the vertex without intravenous contrast. COMPARISON:  October 18, 2011 FINDINGS: Brain: There is moderate diffuse atrophy. There is a focal area of decreased attenuation in the inferior, posterior aspect of the right cerebellum, likely a recent infarct involving a portion of the right posterior inferior cerebral artery distribution. No other evidence of recent/acute infarct. There is patchy small vessel disease throughout the centra semiovale bilaterally. There is evidence of prior infarct involving a portion of the head of the caudate nucleus on the right and anterior limb of the right internal capsule. There is no well-defined mass, hemorrhage, extra-axial fluid collection, or midline shift. Vascular: There is no appreciable hyperdense vessel. There is calcification in each carotid siphon region. There is also calcification in each distal vertebral artery as well as in the right carotid artery in the petrous region. Skull: Bones are osteoporotic.  Bony calvarium appears intact. Sinuses/Orbits: There is opacification of multiple ethmoid air cells the right. There is mucosal thickening throughout multiple ethmoid air cells as well. There is mild mucosal thickening inferior left maxillary antrum anteriorly. There is mucosal thickening in a portion of the left sphenoid sinus. Orbits appear symmetric bilaterally. Other: Mastoid air cells are clear. IMPRESSION: Findings felt to represent an acute/recent infarct in the distribution of a portion of the right posterior inferior cerebellar artery. There is atrophy with extensive supratentorial small vessel disease. Prior infarct noted involving the head of the caudate nucleus on the right as well as a portion of the anterior limb of the right internal capsule. No hemorrhage.  No extra-axial fluid. Extensive arteriovascular calcification. Foci of paranasal sinus disease, most pronounced in the right ethmoid air cell complex. Electronically Signed   By: Lowella Grip III M.D.   On: 10/14/2016 14:48   Dg Chest Portable 1  View  Result Date: 10/14/2016 CLINICAL DATA:  Acute stroke with shortness of breath. EXAM: PORTABLE CHEST 1 VIEW COMPARISON:  11/15/2015 FINDINGS: 1543 hours. The cardio pericardial silhouette is enlarged. Insert basilar atelectasis. No airspace pulmonary edema or focal lung consolidation. No substantial pleural effusion. Left permanent pacemaker again noted. Degenerative changes evident and right shoulder. Telemetry leads overlie the chest. IMPRESSION: Cardiomegaly with basilar atelectasis. Electronically Signed   By: Misty Stanley M.D.   On: 10/14/2016 15:48    EKG: Independently reviewed.  As above  Assessment/Plan   Acute/subacute CVA on CT scan Dog tick? [facility] versus ehlichia  Admitted under stroke order set and complete workup Give aspirin 300 rectally or 325 by mouth now given no hemorrhage-NIHSS to be reassessed as patient was not responsive earlier but is now more with it IV saline 100 cc per hour until repeat swallow eval We will start treatment empirically with doxycycline 100 twice a day and if his titers are normal we will discontinue the same I do not think that there is any other differential like meningitis or encephalitis given nontoxic appearance and lack of neck stiffness  History complete heart lock status post St. Jude's pacemaker 7 2017 Chronic LBBB Chronic systolic heart failure EF 40-45% 11/2015 We will forward a note to his cardiologist Dr. Tamala Julian  For now we will hold his Lasix 20 mg, Aldactone 25 mg and IV fluid replete  Chronic kidney disease stage II Lactic acidosis Hydrate as above especially in setting of potential CVA Diuretic is on hold He has a type B metabolic acidosis secondary to metformin use which may need to be reconsidered as an outpatient  Diabetes mellitus type 2, complication peripheral neuropathy Hold all insulin until appetite can be determined Home regimen insulin 5 units 3 times a day, Lantus 34 at bedtime Given confusion am holding  gabapentin 300 3 times a day for now  Hyperlipidemia  continue Lipitor 40. If stroke is positive increased to high intensity dosing of 80 mg daily  Cervical stenosis status post repair 11/20/2011 Carpal tunnel syndrome Outpatient follow-up  Alzheimer's disease Patient has mild cognitive deficits Daughter is unsure if she can care for him given his increasing deficits Therapy to evaluate for placement  Recent weight loss, moderate malnutrition Patient is edentulous but does have dentures at home unclear if this it well Patient will need outpatient referral to dietitian   Full CODE STATUS Inpatient as will need more than one midnight to determine if stroke versus infectious process causing etiology and treatment Probably will need snf Discussed in detail on telephone with daughter his next of kin       Time spent: 79  Verlon Au [facility] Pager [phone]  If 7PM-7AM, please contact night-coverage [email or web address] Password [email or web address] 10/14/2016, 4:09 PM

## 2016-10-14 NOTE — ED Provider Notes (Signed)
[facility] DEPT Provider Note   CSN: [id number] Arrival date & time: 10/14/16  1306     History   Chief Complaint Chief Complaint  Patient presents with  . Altered Mental Status    HPI [name] is a 83 y.o. male who presents with altered mental status.  His daughter stated that he is normally alert, orientated, able to fully take care of him self and independent.  She went to see him at his home and found that he was still in bed and not acting normal.  Patient was last seen normal around 6pm last night.  He was incontinent of urine upon arrival.  EMS checked his sugar en route and was slightly elevated.  Patient was noted to have a tick on his left proximal medial lower leg.  Unsure how long tick has been attached for.  Patient is able to hold up two fingers, wiggle his fingers and toes.  He is not speaking in full sentences. He is unsure of where he is or what year it is.    HPI  Past Medical History:  Diagnosis Date  . Cancer ([place])    hx of prostate ca  . Cataracts, bilateral    hx of  . CHF (congestive heart failure) ([place])   . Diabetes mellitus   . ED (erectile dysfunction)   . Hyperlipidemia   . Hypertension   . Neuromuscular disorder (H[facility])    numbness in hand/cervical issues  . Pneumonia    hx of  . Syncope    hx of  . Urination frequency     Patient Active Problem List   Diagnosis Date Noted  . Stroke (cerebrum) ([place]) 10/14/2016  . Hand pain 06/28/2016  . S/P cardiac pacemaker procedure   . Debilitated 11/18/2015  . Neuropathic pain   . Acute on chronic combined systolic and diastolic heart failure ([place])   . Tachycardia   . Pacemaker   . DM type 2 with diabetic peripheral neuropathy ([place])   . Benign essential HTN   . History of syncope   . LBBB (left bundle branch block)   . Tachypnea   . Acute blood loss anemia   . NSVT (nonsustained ventricular tachycardia) ([place])   . Bradycardia, severe sinus 11/13/2015  . Complete heart block ([place])   .  Chronic combined systolic and diastolic heart failure ([place]) 04/27/2013  . Essential hypertension 04/27/2013  . Left bundle branch block 04/27/2013  . Type II or unspecified type diabetes mellitus with neurological manifestations, uncontrolled 01/15/2013    Past Surgical History:  Procedure Laterality Date  . CARDIAC CATHETERIZATION N/A 11/13/2015   Procedure: Temporary Pacemaker;  Surgeon: Lorretta Harp, MD;  Location: [facility];  Service: Cardiovascular;  Laterality: N/A;  . EP IMPLANTABLE DEVICE N/A 11/14/2015   Procedure: BiV Pacemaker Insertion CRT-P;  Surgeon: Deboraha Sprang, MD;  Location: [facility];  Service: Cardiovascular;  Laterality: N/A;  . EYE SURGERY     cataract surgery bilateral  . HERNIA REPAIR    . POSTERIOR CERVICAL FUSION/FORAMINOTOMY  11/18/2011   Procedure: POSTERIOR CERVICAL FUSION/FORAMINOTOMY LEVEL 1;  Surgeon: Eustace Moore, MD;  Location: [facility];  Service: Neurosurgery;  Laterality: Bilateral;  . PROSTATE SURGERY     s/p ca  . SMALL INTESTINE SURGERY     hx of  . TONSILLECTOMY         Home Medications    Prior to Admission medications   Medication Sig Start Date  End Date Taking? Authorizing Provider  aspirin 81 MG chewable tablet Chew 81 mg by mouth daily.   Yes [provider]  Cholecalciferol (VITAMIN D3 PO) Take 1 capsule by mouth daily. 175mcg   Yes [provider]  furosemide (LASIX) 20 MG tablet Take 1 tablet (20 mg total) by mouth daily. 04/22/16  Yes Elayne Snare, MD  insulin aspart (NOVOLOG) 100 UNIT/ML injection Inject 5 Units into the skin 3 (three) times daily before meals.   Yes [provider]  insulin glargine (LANTUS) 100 UNIT/ML injection Inject 0.2 mLs (20 Units total) into the skin at bedtime. Patient taking differently: Inject 34 Units into the skin at bedtime.  11/25/15  Yes Angiulli, Lavon Paganini, PA-C  metFORMIN (GLUCOPHAGE-XR) 500 MG 24 hr tablet Take 2 tablets my mouth daily with supper  04/21/16  Yes Elayne Snare, MD  nitroGLYCERIN (NITROSTAT) 0.4 MG SL tablet Place 1 tablet (0.4 mg total) under the tongue every 5 (five) minutes as needed for chest pain. 06/05/14  Yes Belva Crome, MD  NOVOLOG 100 UNIT/ML injection INJECT 5 UNITS INTO THE SKIN 3 TIMES DAILY WITH MEALS 08/23/16  Yes Elayne Snare, MD  [facility] VERIO test strip USE TO TEST 4 TIMES DAILY 08/09/16  Yes Elayne Snare, MD  vitamin B-12 (CYANOCOBALAMIN) 1000 MCG tablet Take 500 mcg by mouth daily.   Yes [provider]    Family History Family History  Problem Relation Age of Onset  . Diabetes Mother   . Hypertension Father     Social History Social History  Substance Use Topics  . Smoking status: Never Smoker  . Smokeless tobacco: Never Used  . Alcohol use No     Comment: daughter states that used to be "heavy weekend drinker" but quit 10 + years ago     Allergies   Patient has no known allergies.   Review of Systems Review of Systems  Unable to perform ROS: Mental status change     Physical Exam Updated Vital Signs BP 135/87 (BP Location: Right Arm)   Pulse 70   Temp 97.7 F (36.5 C) (Oral)   Resp 18   SpO2 99%   Physical Exam  Constitutional: He appears well-developed and well-nourished.  HENT:  Head: Normocephalic and atraumatic.  Right Ear: External ear normal.  Left Ear: External ear normal.  Mouth/Throat: Oropharynx is clear and moist.  Eyes: Pupils are equal, round, and reactive to light. No scleral icterus.  Neck: Normal range of motion. Neck supple. No JVD present. No tracheal deviation present.  Cardiovascular: Normal rate, regular rhythm, normal heart sounds and intact distal pulses.   No murmur heard. Pulmonary/Chest: Effort normal and breath sounds normal. No stridor. No respiratory distress. He has no rales.  Abdominal: Soft. He exhibits no distension. There is no tenderness. There is no guarding.  Musculoskeletal: He exhibits no edema, tenderness (Patient does not  react to palpation of all four extremities. ) or deformity.  Neurological: GCS eye subscore is 4. GCS verbal subscore is 3. GCS motor subscore is 6.  Unable to adequately assess patients strength, cranial nerves, motor function as he is altered.    Skin: Skin is warm and dry. He is not diaphoretic.  Nursing note and vitals reviewed.    ED Treatments / Results  Labs (all labs ordered are listed, but only abnormal results are displayed) Labs Reviewed  COMPREHENSIVE METABOLIC PANEL - Abnormal; Notable for the following:       Result Value   CO2  20 (*)    Glucose, Bld 176 (*)    Creatinine, Ser 1.32 (*)    ALT 16 (*)    Total Bilirubin 1.5 (*)    GFR calc non Af Amer 48 (*)    GFR calc Af Amer 56 (*)    Anion gap 16 (*)    All other components within normal limits  URINALYSIS, ROUTINE W REFLEX MICROSCOPIC - Abnormal; Notable for the following:    APPearance HAZY (*)    Hgb urine dipstick SMALL (*)    Ketones, ur 20 (*)    Protein, ur >=300 (*)    Bacteria, UA RARE (*)    Squamous Epithelial / LPF 0-5 (*)    All other components within normal limits  I-STAT TROPOININ, ED - Abnormal; Notable for the following:    Troponin i, poc 0.16 (*)    All other components within normal limits  I-STAT CG4 LACTIC ACID, ED - Abnormal; Notable for the following:    Lactic Acid, Venous 2.49 (*)    All other components within normal limits  I-STAT CHEM 8, ED - Abnormal; Notable for the following:    Glucose, Bld 178 (*)    Calcium, Ion 1.12 (*)    All other components within normal limits  CULTURE, BLOOD (ROUTINE X 2)  CULTURE, BLOOD (ROUTINE X 2)  ETHANOL  CBC WITH DIFFERENTIAL/PLATELET  RAPID URINE DRUG SCREEN, HOSP PERFORMED  ROCKY MTN SPOTTED FVR ABS PNL(IGG+IGM)  EHRLICHIA ANTIBODY PANEL  B. BURGDORFI ANTIBODIES  PROTIME-INR  APTT  HEMOGLOBIN A1C  LIPID PANEL    EKG  EKG Interpretation  Date/Time:  Thursday October 14 2016 13:14:31 EDT Ventricular Rate:  79 PR Interval:    QRS  Duration: 130 QT Interval:  452 QTC Calculation: 519 R Axis:   -97 Text Interpretation:  A-V dual-paced rhythm with some inhibition No further analysis attempted due to paced rhythm Confirmed by Hazle Coca [phone]) on 10/14/2016 1:18:29 PM Also confirmed by Hazle Coca [phone]), editor Verna Czech [phone])  on 10/14/2016 1:30:37 PM       Radiology Ct Head Wo Contrast  Result Date: 10/14/2016 CLINICAL DATA:  Altered mental status EXAM: CT HEAD WITHOUT CONTRAST TECHNIQUE: Contiguous axial images were obtained from the base of the skull through the vertex without intravenous contrast. COMPARISON:  October 18, 2011 FINDINGS: Brain: There is moderate diffuse atrophy. There is a focal area of decreased attenuation in the inferior, posterior aspect of the right cerebellum, likely a recent infarct involving a portion of the right posterior inferior cerebral artery distribution. No other evidence of recent/acute infarct. There is patchy small vessel disease throughout the centra semiovale bilaterally. There is evidence of prior infarct involving a portion of the head of the caudate nucleus on the right and anterior limb of the right internal capsule. There is no well-defined mass, hemorrhage, extra-axial fluid collection, or midline shift. Vascular: There is no appreciable hyperdense vessel. There is calcification in each carotid siphon region. There is also calcification in each distal vertebral artery as well as in the right carotid artery in the petrous region. Skull: Bones are osteoporotic.  Bony calvarium appears intact. Sinuses/Orbits: There is opacification of multiple ethmoid air cells the right. There is mucosal thickening throughout multiple ethmoid air cells as well. There is mild mucosal thickening inferior left maxillary antrum anteriorly. There is mucosal thickening in a portion of the left sphenoid sinus. Orbits appear symmetric bilaterally. Other: Mastoid air cells are clear. IMPRESSION: Findings felt to  represent  an acute/recent infarct in the distribution of a portion of the right posterior inferior cerebellar artery. There is atrophy with extensive supratentorial small vessel disease. Prior infarct noted involving the head of the caudate nucleus on the right as well as a portion of the anterior limb of the right internal capsule. No hemorrhage.  No extra-axial fluid. Extensive arteriovascular calcification. Foci of paranasal sinus disease, most pronounced in the right ethmoid air cell complex. Electronically Signed   By: Lowella Grip III M.D.   On: 10/14/2016 14:48   Dg Chest Portable 1 View  Result Date: 10/14/2016 CLINICAL DATA:  Acute stroke with shortness of breath. EXAM: PORTABLE CHEST 1 VIEW COMPARISON:  11/15/2015 FINDINGS: 1543 hours. The cardio pericardial silhouette is enlarged. Insert basilar atelectasis. No airspace pulmonary edema or focal lung consolidation. No substantial pleural effusion. Left permanent pacemaker again noted. Degenerative changes evident and right shoulder. Telemetry leads overlie the chest. IMPRESSION: Cardiomegaly with basilar atelectasis. Electronically Signed   By: Misty Stanley M.D.   On: 10/14/2016 15:48    Procedures Procedures (including critical care time)  CRITICAL CARE Performed by: Wyn Quaker Total critical care time: 37 minutes Critical care time was exclusive of separately billable procedures and treating other patients. Critical care was necessary to treat or prevent imminent or life-threatening deterioration. Critical care was time spent personally by me on the following activities: development of treatment plan with patient and/or surrogate as well as nursing, discussions with consultants, evaluation of patient's response to treatment, examination of patient, obtaining history from patient or surrogate, ordering and performing treatments and interventions, ordering and review of laboratory studies, ordering and review of radiographic  studies, pulse oximetry and re-evaluation of patient's condition.  Stroke with consideration of TPA, outside of TPA window.    Tick removal 1330- the Area was cleaned with ChloraPrep. Using small tweezers the tick was grasped as close to the skin as possible and removed without complications. The tick was then placed in a sterile container and was seems to be crawling around.  The site was inspected and it appears the tick was removed in whole. Tick appears to be a dog tick based on size.  No markings consistent with lone star tick noted.  Site was dressed with bacitracin and Band-Aid.   Medications Ordered in ED   Initial Impression / Assessment and Plan / ED Course  I have reviewed the triage vital signs and the nursing notes.  Pertinent labs & imaging results that were available during my care of the patient were reviewed by me and considered in my medical decision making (see chart for details).  Clinical Course as of Oct 14 2037  Thu Oct 14, 2016  1540 Patient is more alert than he was earlier.  He is able to answer questions and follow commands.  He says that his head hurts.  He is oriented to person but not to place or time.  He Does not have pronator drift, facial droop or slurred speech currently.    [EH]  1606 Spoke with neurology who does not have any specific recommendations. Will admit to triad.    [EH]    Clinical Course User Index [EH] Lorin Glass, PA-C   [name] presents with altered mental status.  He was last seen normal around 6pm last night.  He was initially incontinent of urine, and unable to speak in full sentences.  Patient outside the tPA and code stroke window.  CT was obtained with results concerning for  stroke as listed above.  Also of note he has a troponin elevation of 0.16 and a lactic acid of 2.49. Patient does not meet SIRS or sepsis criteria.  Based on CT with stroke patient will be admitted to triad hospitalitis for continued evaluation.   Neurology was consulted with no specific recommendations.  Patients mental status appeared to wax and wane while in the ED.  Labs for tick borne illnesses were ordered along with blood cultures.    The patient appears reasonably stabilized for admission considering the current resources, flow, and capabilities available in the ED at this time, and I doubt any other [facility] requiring further screening and/or treatment in the ED prior to admission.  The patient was discussed with Dr. Ralene Bathe who evaluated the patient and agreed with my plan.    Final Clinical Impressions(s) / ED Diagnoses   Final diagnoses:  Cerebrovascular accident (CVA), unspecified mechanism ([place])  Tick bite of calf, initial encounter    New Prescriptions Current Discharge Medication List       Ollen Gross 10/14/16 2043    Quintella Reichert, MD 10/15/16 [phone]

## 2016-10-14 NOTE — ED Notes (Signed)
Neurologist came to evaluate pt states he feels confident to repeat the swallow screen due to patients improved cognitive status

## 2016-10-14 NOTE — ED Notes (Signed)
Daughter Eliezer Lofts): [phone]

## 2016-10-15 ENCOUNTER — Inpatient Hospital Stay (HOSPITAL_COMMUNITY): Payer: Medicare Other

## 2016-10-15 DIAGNOSIS — I6789 Other cerebrovascular disease: Secondary | ICD-10-CM

## 2016-10-15 LAB — BLOOD CULTURE ID PANEL (REFLEXED)
ACINETOBACTER BAUMANNII: NOT DETECTED
CANDIDA ALBICANS: NOT DETECTED
Candida glabrata: NOT DETECTED
Candida krusei: NOT DETECTED
Candida parapsilosis: NOT DETECTED
Candida tropicalis: NOT DETECTED
ENTEROBACTERIACEAE SPECIES: NOT DETECTED
ENTEROCOCCUS SPECIES: NOT DETECTED
ESCHERICHIA COLI: NOT DETECTED
Enterobacter cloacae complex: NOT DETECTED
HAEMOPHILUS INFLUENZAE: NOT DETECTED
Klebsiella oxytoca: NOT DETECTED
Klebsiella pneumoniae: NOT DETECTED
LISTERIA MONOCYTOGENES: NOT DETECTED
METHICILLIN RESISTANCE: NOT DETECTED
Neisseria meningitidis: NOT DETECTED
Proteus species: NOT DETECTED
Pseudomonas aeruginosa: NOT DETECTED
SERRATIA MARCESCENS: NOT DETECTED
STAPHYLOCOCCUS AUREUS BCID: NOT DETECTED
STREPTOCOCCUS AGALACTIAE: NOT DETECTED
STREPTOCOCCUS PNEUMONIAE: NOT DETECTED
Staphylococcus species: DETECTED — AB
Streptococcus pyogenes: NOT DETECTED
Streptococcus species: NOT DETECTED

## 2016-10-15 LAB — LIPID PANEL
Cholesterol: 125 mg/dL (ref 0–200)
HDL: 44 mg/dL
LDL Cholesterol: 67 mg/dL (ref 0–99)
Total CHOL/HDL Ratio: 2.8 ratio
Triglycerides: 69 mg/dL
VLDL: 14 mg/dL (ref 0–40)

## 2016-10-15 LAB — ECHOCARDIOGRAM COMPLETE

## 2016-10-15 LAB — GLUCOSE, CAPILLARY: Glucose-Capillary: 299 mg/dL — ABNORMAL HIGH (ref 65–99)

## 2016-10-15 MED ORDER — INSULIN ASPART 100 UNIT/ML ~~LOC~~ SOLN
3.0000 [IU] | Freq: Three times a day (TID) | SUBCUTANEOUS | Status: DC
  Administered 2016-10-16 – 2016-10-18 (×5): 3 [IU] via SUBCUTANEOUS

## 2016-10-15 MED ORDER — IOPAMIDOL (ISOVUE-370) INJECTION 76%
INTRAVENOUS | Status: AC
  Administered 2016-10-15: 50 mL via INTRAVENOUS
  Filled 2016-10-15: qty 50

## 2016-10-15 MED ORDER — INSULIN ASPART 100 UNIT/ML ~~LOC~~ SOLN
0.0000 [IU] | Freq: Three times a day (TID) | SUBCUTANEOUS | Status: DC
  Administered 2016-10-16 (×2): 7 [IU] via SUBCUTANEOUS
  Administered 2016-10-17: 3 [IU] via SUBCUTANEOUS
  Administered 2016-10-17: 5 [IU] via SUBCUTANEOUS

## 2016-10-15 NOTE — Progress Notes (Signed)
PT HAS  THE QUADRA ALLURE MP CRT-P BY [facility].  THIS MODEL HAS NOT YET BEEN TESTED FOR MRI SAFETY , SO THEREFORE WE ARE UNABLE TO PERFORM HIS MRI AT THIS TIME.

## 2016-10-15 NOTE — Evaluation (Addendum)
Clinical/Bedside Swallow Evaluation Patient Details  Name: [name] MRN: [id number] Date of Birth: [birth year]  Today's Date: 10/15/2016 Time: SLP Start Time (ACUTE ONLY): 0930 SLP Stop Time (ACUTE ONLY): 1001 SLP Time Calculation (min) (ACUTE ONLY): 31 min  Past Medical History:  Past Medical History:  Diagnosis Date  . Cancer ([place])    hx of prostate ca  . Cataracts, bilateral    hx of  . CHF (congestive heart failure) ([place])   . Diabetes mellitus   . ED (erectile dysfunction)   . Hyperlipidemia   . Hypertension   . Neuromuscular disorder ([facility])    numbness in hand/cervical issues  . Pneumonia    hx of  . Syncope    hx of  . Urination frequency    Past Surgical History:  Past Surgical History:  Procedure Laterality Date  . CARDIAC CATHETERIZATION N/A 11/13/2015   Procedure: Temporary Pacemaker;  Surgeon: Lorretta Harp, MD;  Location: [facility];  Service: Cardiovascular;  Laterality: N/A;  . EP IMPLANTABLE DEVICE N/A 11/14/2015   Procedure: BiV Pacemaker Insertion CRT-P;  Surgeon: Deboraha Sprang, MD;  Location: [facility];  Service: Cardiovascular;  Laterality: N/A;  . EYE SURGERY     cataract surgery bilateral  . HERNIA REPAIR    . POSTERIOR CERVICAL FUSION/FORAMINOTOMY  11/18/2011   Procedure: POSTERIOR CERVICAL FUSION/FORAMINOTOMY LEVEL 1;  Surgeon: Eustace Moore, MD;  Location: [facility];  Service: Neurosurgery;  Laterality: Bilateral;  . PROSTATE SURGERY     s/p ca  . SMALL INTESTINE SURGERY     hx of  . TONSILLECTOMY     HPI:  This is an 83 year old man brought to the emergency room for evaluation of altered mental status. History is limited to review the patient's medical record. He is unable to provide much in the way of useful information. No family is present at the bedside; pt has a history of moderate-severe cognitive impairment 5/17 with [facility] and lives with family at this time; CT head on 10/14/16 indicated  Findings felt to  represent an acute/recent infarct in the distribution of a portion of the right posterior inferior cerebellar artery.  Assessment / Plan / Recommendation Clinical Impression   Pt with baseline cognitive deficits (moderate-severe) which minimally impact consumption of po intake, although no s/s of aspiration noted during BSE with any consistency ranging from thin via cup/straw-solids; impaired mastication noted with solids d/t limited missing dentition with only lower dentition noted during BSE; pt needs assistance with self feeding at this time and should continue d/t cognitive deficits and decreased sustained attention. Recommend continue current diet of carb modified/thin with general swallowing precautions in place for decreased environmental distractions, slow rate and small bites/sips; ST will f/u for cognitive deficits post-CVA, but pt appears to be tolerating current diet. SLP Visit Diagnosis: Dysphagia, unspecified (R13.10)    Aspiration Risk  Mild aspiration risk    Diet Recommendation   Carb modified/thin liquids  Medication Administration: Whole meds with puree    Other  Recommendations Oral Care Recommendations: Oral care BID   Follow up Recommendations Skilled Nursing facility      Frequency and Duration min 2x/week  1 week       Prognosis Prognosis for Safe Diet Advancement: Good Barriers to Reach Goals: Cognitive deficits      Swallow Study   General Date of Onset: 10/14/16 HPI: This is an 83 year old man brought to the emergency room for evaluation of altered mental  status. History is limited to review the patient's medical record. He is unable to provide much in the way of useful information. No family is present at the bedside Type of Study: Bedside Swallow Evaluation Diet Prior to this Study: Regular;Thin liquids Temperature Spikes Noted: No Respiratory Status: Room air History of Recent Intubation: No Behavior/Cognition: Alert;Confused Oral Cavity Assessment:  Within Functional Limits Oral Care Completed by SLP: No Oral Cavity - Dentition: Missing dentition;Other (Comment) (Missing top dentition) Vision: Functional for self-feeding Self-Feeding Abilities: Able to feed self;Needs assist Patient Positioning: Upright in bed Baseline Vocal Quality: Low vocal intensity Volitional Cough: Strong Volitional Swallow: Able to elicit    Oral/Motor/Sensory Function Overall Oral Motor/Sensory Function: Within functional limits   Ice Chips Ice chips: Within functional limits Presentation: Spoon   Thin Liquid Thin Liquid: Within functional limits Presentation: Cup;Straw    Nectar Thick Nectar Thick Liquid: Not tested   Honey Thick Honey Thick Liquid: Not tested   Puree Puree: Within functional limits Presentation: Spoon   Solid      Solid: Impaired Presentation: Self Fed Oral Phase Functional Implications: Impaired mastication    Functional Assessment Tool Used:  (NOMS; clinical judgment) Functional Limitations: Swallowing Swallow Current Status (W8088): At least 1 percent but less than 20 percent impaired, limited or restricted Swallow Goal Status [phone]): At least 1 percent but less than 20 percent impaired, limited or restricted Swallow Discharge Status [phone]): At least 1 percent but less than 20 percent impaired, limited or restricted   Elvina Sidle, M.S., CCC-SLP 10/15/2016,10:12 AM

## 2016-10-15 NOTE — Progress Notes (Signed)
PHARMACY - PHYSICIAN COMMUNICATION CRITICAL VALUE ALERT - BLOOD CULTURE IDENTIFICATION (BCID)  Results for orders placed or performed during the hospital encounter of 10/14/16  Blood Culture ID Panel (Reflexed) (Collected: 10/14/2016  2:13 PM)  Result Value Ref Range   Enterococcus species NOT DETECTED NOT DETECTED   Listeria monocytogenes NOT DETECTED NOT DETECTED   Staphylococcus species DETECTED (A) NOT DETECTED   Staphylococcus aureus NOT DETECTED NOT DETECTED   Methicillin resistance NOT DETECTED NOT DETECTED   Streptococcus species NOT DETECTED NOT DETECTED   Streptococcus agalactiae NOT DETECTED NOT DETECTED   Streptococcus pneumoniae NOT DETECTED NOT DETECTED   Streptococcus pyogenes NOT DETECTED NOT DETECTED   Acinetobacter baumannii NOT DETECTED NOT DETECTED   Enterobacteriaceae species NOT DETECTED NOT DETECTED   Enterobacter cloacae complex NOT DETECTED NOT DETECTED   Escherichia coli NOT DETECTED NOT DETECTED   Klebsiella oxytoca NOT DETECTED NOT DETECTED   Klebsiella pneumoniae NOT DETECTED NOT DETECTED   Proteus species NOT DETECTED NOT DETECTED   Serratia marcescens NOT DETECTED NOT DETECTED   Haemophilus influenzae NOT DETECTED NOT DETECTED   Neisseria meningitidis NOT DETECTED NOT DETECTED   Pseudomonas aeruginosa NOT DETECTED NOT DETECTED   Candida albicans NOT DETECTED NOT DETECTED   Candida glabrata NOT DETECTED NOT DETECTED   Candida krusei NOT DETECTED NOT DETECTED   Candida parapsilosis NOT DETECTED NOT DETECTED   Candida tropicalis NOT DETECTED NOT DETECTED    Name of physician (or Provider) Contacted: Dr. Verlon Au  Changes to prescribed antibiotics required: none, suspect contaminant. Will follow blood cultures.   Duayne Cal 10/15/2016  10:56 AM

## 2016-10-15 NOTE — Progress Notes (Signed)
  Echocardiogram 2D Echocardiogram has been performed.  [name] 10/15/2016, 12:35 PM

## 2016-10-15 NOTE — Progress Notes (Signed)
Re: Carotid duplex  Normal CTA neck 6/8- please advise if carotid duplex still needed.   Vascular lab [phone]

## 2016-10-15 NOTE — Progress Notes (Signed)
PT Cancellation Note  Patient Details Name: [name] MRN: [id number] DOB: [birth year]   Cancelled Treatment:    Reason Eval/Treat Not Completed: Patient at procedure or test/unavailable.  Has CT scheduled and in mid attempt to sit up was interrupted by CT transport staff.  Nursing stepped in to prep pt to leave and will try later as time allows.   Ramond Dial 10/15/2016, 10:00 AM   Mee Hives, PT MS Acute Rehab Dept. Number: [place] [facility] [place]

## 2016-10-15 NOTE — Evaluation (Signed)
Occupational Therapy Evaluation Patient Details Name: [name] MRN: [id number] DOB: 1931-03-05 Today's Date: 10/15/2016    History of Present Illness This is an 83 year old man brought to the emergency room for evaluation of altered mental status. History is limited to review the patient's medical record. He is unable to provide much in the way of useful information. No family is present at the bedside pt has a history of moderate-severe cognitive impairment 5/17 with [facility] and lives with family at this time; CT head on 10/14/16 indicated  Findings felt to represent an acute/recent infarct in the distribution of a portion of the right posterior inferior cerebellar artery.   Clinical Impression   Patient presenting with decreased I in self care, balance, functional transfers, safety awareness, and cognition .Patient reports being mod I PTA. Patient currently functioning max A overall. Patient will benefit from acute OT to increase overall independence in the areas of ADLs, functional mobility, and safety awareness in order to safely discharge to next venue of care. Pt is very lethargic throughout session and needing mod -max multimodal cues to initiate and sequence tasks. Pt oriented to self only.     Follow Up Recommendations  CIR    Equipment Recommendations  Other (comment) (defer to next venue of care)    Recommendations for Other Services Rehab consult     Precautions / Restrictions Precautions Precautions: Fall      Mobility Bed Mobility Overal bed mobility: Needs Assistance Bed Mobility: Supine to Sit;Sit to Supine     Supine to sit: Max assist Sit to supine: Mod assist   General bed mobility comments: Pt needing assistance with B LE's and trunk. Pt required increased time to initiate and sequence movement. Assistance to place B LE's back onto bed to return to supine.   Transfers Overall transfer level: Needs assistance Equipment used: None Transfers: Sit  to/from Stand Sit to Stand: Max assist;Mod assist         General transfer comment: mod - max A for lifting and lowering assistance. Pt able to stand for 3 minutes with mod A standing balance.    Balance Overall balance assessment: Needs assistance Sitting-balance support: Feet supported;Single extremity supported Sitting balance-Leahy Scale: Poor Sitting balance - Comments: min - mod A needed for static sitting balance for 5 minutes on EOB. Postural control: Posterior lean Standing balance support: Bilateral upper extremity supported Standing balance-Leahy Scale: Poor Standing balance comment: mod A standing balance.                           ADL either performed or assessed with clinical judgement   ADL Overall ADL's : Needs assistance/impaired                  General ADL Comments: Pt needing min A for UB self care and max A for LB self care at this time. Pt very lethargic throughout the session.      Vision Baseline Vision/History: Wears glasses Wears Glasses: Reading only Patient Visual Report: No change from baseline       Perception     Praxis      Pertinent Vitals/Pain Pain Assessment: Faces Faces Pain Scale: No hurt     Hand Dominance Right   Extremity/Trunk Assessment Upper Extremity Assessment Upper Extremity Assessment: Generalized weakness   Lower Extremity Assessment Lower Extremity Assessment: Defer to PT evaluation       Communication     Cognition Arousal/Alertness: Lethargic  Behavior During Therapy: Flat affect Overall Cognitive Status: Impaired/Different from baseline Area of Impairment: Safety/judgement;Following commands;Orientation;Problem solving;Awareness                 Orientation Level: Disoriented to;Place;Time;Situation     Following Commands: Follows one step commands consistently;Follows one step commands with increased time Safety/Judgement: Decreased awareness of safety Awareness:  Intellectual Problem Solving: Slow processing;Decreased initiation;Difficulty sequencing;Requires verbal cues;Requires tactile cues                Home Living Family/patient expects to be discharged to:: Private residence Living Arrangements: Children Available Help at Discharge: Family;Available PRN/intermittently Type of Home: House Home Access: Stairs to enter [employer] of Steps: 3 Entrance Stairs-Rails: Right;Left;Can reach both Home Layout: One level     Bathroom Shower/Tub: Occupational psychologist: Standard Bathroom Accessibility: Yes   Home Equipment: Cane - quad;Hand held shower head;Grab bars - tub/shower;Shower seat   Additional Comments: Information based on prior admission as pt is poor historian  Lives With: Family    Prior Functioning/Environment Level of Independence: Independent with assistive device(s)        Comments: used cane, drove himself, daughter took care of home management        OT Problem List: Decreased strength;Decreased activity tolerance;Impaired balance (sitting and/or standing);Decreased coordination;Decreased safety awareness;Decreased cognition;Decreased knowledge of use of DME or AE;Decreased knowledge of precautions;Pain      OT Treatment/Interventions: Self-care/ADL training;Therapeutic exercise;Neuromuscular education;Energy conservation;DME and/or AE instruction;Cognitive remediation/compensation;Therapeutic activities;Balance training;Patient/family education;Manual therapy    OT Goals(Current goals can be found in the care plan section) Acute Rehab OT Goals Patient Stated Goal: "to go" Time For Goal Achievement: 10/29/16 Potential to Achieve Goals: Fair ADL Goals Pt Will Perform Eating: with set-up;sitting Pt Will Perform Grooming: sitting;with min assist Pt Will Perform Upper Body Bathing: with min assist;sitting Pt Will Perform Lower Body Bathing: with mod assist;sit to/from stand Pt Will Perform  Upper Body Dressing: with min assist;sitting Pt Will Perform Lower Body Dressing: with mod assist;sitting/lateral leans Pt Will Transfer to Toilet: with mod assist Pt Will Perform Toileting - Clothing Manipulation and hygiene: with mod assist  OT Frequency: Min 2X/week   Barriers to D/C: Decreased caregiver support             AM-PAC PT "6 Clicks" Daily Activity     Outcome Measure Help from another person eating meals?: A Lot Help from another person taking care of personal grooming?: A Lot Help from another person toileting, which includes using toliet, bedpan, or urinal?: A Lot Help from another person bathing (including washing, rinsing, drying)?: A Lot Help from another person to put on and taking off regular upper body clothing?: A Lot Help from another person to put on and taking off regular lower body clothing?: A Lot 6 Click Score: 12   End of Session Nurse Communication: Mobility status  Activity Tolerance: Patient limited by lethargy Patient left: in bed;with call bell/phone within reach;with bed alarm set  OT Visit Diagnosis: Muscle weakness (generalized) (M62.81);Unsteadiness on feet (R26.81)                Time: 3893-7342 OT Time Calculation (min): 23 min Charges:  OT General Charges $OT Visit: 1 Procedure OT Evaluation $OT Eval Moderate Complexity: 1 Procedure OT Treatments $Therapeutic Activity: 8-22 mins G-Codes:      Kingslee Mairena P, MS, OTR/L, CBIS 10/15/2016, 1:03 PM

## 2016-10-15 NOTE — Evaluation (Signed)
Speech Language Pathology Evaluation Patient Details Name: [name] MRN: [id number] DOB: 1930/11/19 Today's Date: 10/15/2016 Time: 0930-1001 SLP Time Calculation (min) (ACUTE ONLY): 31 min  Problem List:  Patient Active Problem List   Diagnosis Date Noted  . Stroke (cerebrum) ([place]) 10/14/2016  . Hand pain 06/28/2016  . S/P cardiac pacemaker procedure   . Debilitated 11/18/2015  . Neuropathic pain   . Acute on chronic combined systolic and diastolic heart failure ([place])   . Tachycardia   . Pacemaker   . DM type 2 with diabetic peripheral neuropathy ([place])   . Benign essential HTN   . History of syncope   . LBBB (left bundle branch block)   . Tachypnea   . Acute blood loss anemia   . NSVT (nonsustained ventricular tachycardia) ([place])   . Bradycardia, severe sinus 11/13/2015  . Complete heart block ([place])   . Chronic combined systolic and diastolic heart failure ([place]) 04/27/2013  . Essential hypertension 04/27/2013  . Left bundle branch block 04/27/2013  . Type II or unspecified type diabetes mellitus with neurological manifestations, uncontrolled 01/15/2013   Past Medical History:  Past Medical History:  Diagnosis Date  . Cancer ([place])    hx of prostate ca  . Cataracts, bilateral    hx of  . CHF (congestive heart failure) ([place])   . Diabetes mellitus   . ED (erectile dysfunction)   . Hyperlipidemia   . Hypertension   . Neuromuscular disorder ([facility])    numbness in hand/cervical issues  . Pneumonia    hx of  . Syncope    hx of  . Urination frequency    Past Surgical History:  Past Surgical History:  Procedure Laterality Date  . CARDIAC CATHETERIZATION N/A 11/13/2015   Procedure: Temporary Pacemaker;  Surgeon: Lorretta Harp, MD;  Location: [facility];  Service: Cardiovascular;  Laterality: N/A;  . EP IMPLANTABLE DEVICE N/A 11/14/2015   Procedure: BiV Pacemaker Insertion CRT-P;  Surgeon: Deboraha Sprang, MD;  Location: [facility];  Service:  Cardiovascular;  Laterality: N/A;  . EYE SURGERY     cataract surgery bilateral  . HERNIA REPAIR    . POSTERIOR CERVICAL FUSION/FORAMINOTOMY  11/18/2011   Procedure: POSTERIOR CERVICAL FUSION/FORAMINOTOMY LEVEL 1;  Surgeon: Eustace Moore, MD;  Location: [facility];  Service: Neurosurgery;  Laterality: Bilateral;  . PROSTATE SURGERY     s/p ca  . SMALL INTESTINE SURGERY     hx of  . TONSILLECTOMY     HPI:  This is an 83 year old man brought to the emergency room for evaluation of altered mental status. History is limited to review the patient's medical record. He is unable to provide much in the way of useful information. No family is present at the bedside pt has a history of moderate-severe cognitive impairment 5/17 with [facility] and lives with family at this time; CT head on 10/14/16 indicated  Findings felt to represent an acute/recent infarct in the distribution of a portion of the right posterior inferior cerebellar artery.   Assessment / Plan / Recommendation Clinical Impression   Pt with baseline moderate-severe cognitive deficits, which appear to be exacerbated by recent CVA with results from [facility] Cognitive Assessment) indicating deficits in memory, attention, language and orientation with an overall score of 5/30 (26/30 being a typical normative score) with previous score indicated by [facility]rology 5/17 of 18/30. Pt was oriented to self only, able to follow 1-step directives, unable to store  new information and appeared to exhibit a language of confusion with verbal interactions during conversational attempts. Repetition impaired with sentences and number recall and graphic expression/reading impaired d/t underlying cognitive deficits as well.  Pt also did not have glasses available for assessment, which may impact results obtained.  ST will f/u for new cognitive changes and address impairments stated above.     SLP Assessment  SLP Visit Diagnosis: Dysphagia,  unspecified (R13.10);Cognitive communication deficit (R41.841)    Follow Up Recommendations  Skilled Nursing facility    Frequency and Duration min 2x/week  1 week      SLP Evaluation Cognition  Overall Cognitive Status: Impaired/Different from baseline Arousal/Alertness: Awake/alert Orientation Level: Oriented to person;Disoriented to place;Disoriented to time;Disoriented to situation Attention: Sustained Sustained Attention: Impaired Sustained Attention Impairment: Verbal basic;Functional basic Memory: Impaired Memory Impairment: Storage deficit;Decreased recall of new information;Decreased short term memory Decreased Short Term Memory: Verbal basic;Functional basic Awareness: Impaired Awareness Impairment: Intellectual impairment;Emergent impairment Problem Solving: Impaired Problem Solving Impairment: Verbal basic;Functional basic Behaviors: Perseveration Safety/Judgment: Impaired       Comprehension  Auditory Comprehension Overall Auditory Comprehension: Impaired at baseline Yes/No Questions: Within Functional Limits Commands: Impaired Two Step Basic Commands: 25-49% accurate Conversation: Simple Interfering Components: Attention;Working Field seismologist: Extra processing time;Increased volume;Slowed speech;Visual/Gestural cues Visual Recognition/Discrimination Discrimination:  (difficult to assess d/t cognitive deficits) Reading Comprehension Reading Status: Unable to assess (comment) (no glasses; sustained attention impacted testing)    Expression Expression Primary Mode of Expression: Verbal Verbal Expression Overall Verbal Expression: Impaired at baseline Level of Generative/Spontaneous Verbalization: Conversation Repetition: Impaired Level of Impairment: Phrase level Naming: Impairment Responsive: 26-50% accurate Confrontation: Impaired Convergent: 25-49% accurate Divergent: 0-24% accurate Verbal Errors: Perseveration;Not aware of  errors Interfering Components: Attention Non-Verbal Means of Communication: Not applicable Written Expression Dominant Hand: Right Written Expression: Unable to assess (comment) (d/t cognitive impairment)   Oral / Motor  Oral Motor/Sensory Function Overall Oral Motor/Sensory Function: Within functional limits Motor Speech Overall Motor Speech: Appears within functional limits for tasks assessed Respiration: Within functional limits Phonation: Normal Resonance: Within functional limits Articulation: Within functional limitis Intelligibility: Intelligible Motor Planning: Witnin functional limits            Functional Assessment Tool Used:  (NOMS; clinical judgment) Functional Limitations: Swallowing Swallow Current Status ([id number]): At least 1 percent but less than 20 percent impaired, limited or restricted Swallow Goal Status [phone]): At least 1 percent but less than 20 percent impaired, limited or restricted Swallow Discharge Status [phone]): At least 1 percent but less than 20 percent impaired, limited or restricted         Elvina Sidle, M.S., CCC-SLP 10/15/2016, 10:27 AM

## 2016-10-15 NOTE — Progress Notes (Signed)
Pt removed IV from left AC. No bleeding. Dry dressing applied. New order for IV team consult.

## 2016-10-15 NOTE — Progress Notes (Signed)
PROGRESS NOTE    [name]  LXB:[id number] DOB: [birth year] DOA: 10/14/2016 PCP: Elayne Snare, MD  Outpatient Specialists:     Brief Narrative:   83 y/o ? 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 storke vs other casues   Labs on admit slight  ? bun/creat 20/1.2-->18/1.3 Troponin 0.16 Lactic acid 2.4 CT scan showing subacute infarct on scan Chest x-ray shows cardiomegaly with mild vascular congestion EKG shows AV dual paced rhythm at around 60 without any further interpretation possible    Assessment & Plan:   Active Problems:   Stroke (cerebrum) (HCC) Alzhemier's -? Rapidly cycling? DDX delirium-has waxing and waning answers to orienting questions, note SLP MOCA Doesn't seem to be CVA per Neuro? CTA non convincing ?Rapid onset and drop MMSE18--5>/3, ? Med or other casue Is LP/Heavy metal testing and other etiology worth r/o?  H/o CHB s/p St Jude PPm 0/3212 chf systolic Ef 24-82% 09/35              LBBB Continue aspirin 81 mg daily for now Outpatient EP re-eval as an outpatient  DM ty II last C4U 7.1 complicated by peripheral neuropathy and onychogryphosis  Lantus on hold regular insulin on hold metformin on hold  Check CBGs 4 times a day before meals at bedtime and cautiously resume  CKD stg III Type B lactic acidosis secondary to metformin, not infection  Creatinine 1.3--1.1 with saline, continue IV saline 75 cc per hour.  htn Hold antihypertensives for now  Prostate cancer with XRT Outpatient follow-up  Prior h/o syncope with admission 2008 Cervical stenosis + myelopathy s/p CL/PCK C7-T1 11/20/11 Carpakl tunnel  synd  Lovenox for now Full code    Consultants:   neuro  Procedures:   CTA  Antimicrobials:    none    Subjective:  Less confused   Objective: Vitals:   10/15/16 0830 10/15/16 1104 10/15/16 1419 10/15/16 1634  BP: 130/82 126/83 (!) 133/91 (!) 135/92  Pulse: 74 72 73 76  Resp: _0 Temp: 98.6 F (37 C) 99 F (37.2 C) 98.4 F (36.9 C) 98.9 F (37.2 C)  TempSrc: Axillary Axillary Axillary Oral  SpO2: 98% 97% 99% 100%    Intake/Output Summary (Last 24 hours) at 10/15/16 1738 Last data filed at 10/15/16 1627  Gross per 24 hour  Intake          1733.75 ml  Output                0 ml  Net          1733.75 ml   There were no vitals filed for this visit.  Examination:  Alert pleasant slightly confused eomi ncat cta b Arm raises as directed Power 5/5 Sensory hard to detemrine Reflexes 2/3   Data Reviewed: I have personally reviewed following labs and imaging studies  CBC:  Recent Labs Lab 10/14/16 1351 10/14/16 1444  WBC 8.8  --   NEUTROABS 7.5  --   HGB 14.3 16.7  HCT 42.4 49.0  MCV 94.0  --   PLT 182  --  Basic Metabolic Panel:  Recent Labs Lab 10/14/16 1351 10/14/16 1444  NA 139 141  K 4.6 4.5  CL 103 106  CO2 20*  --   GLUCOSE 176* 178*  BUN 18 20  CREATININE 1.32* 1.10  CALCIUM 9.4  --    GFR: CrCl cannot be calculated (Unknown ideal weight.). Liver Function Tests:  Recent Labs Lab 10/14/16 1351  AST 25  ALT 16*  ALKPHOS 110  BILITOT 1.5*  PROT 6.9  ALBUMIN 3.8   No results for input(s): LIPASE, AMYLASE in the last 168 hours. No results for input(s): AMMONIA in the last 168 hours. Coagulation Profile: No results for input(s): INR, PROTIME in the last 168 hours. Cardiac Enzymes: No results for input(s): CKTOTAL, CKMB, CKMBINDEX, TROPONINI in the last 168 hours. BNP (last 3 results) No results for input(s): PROBNP in the last 8760 hours. HbA1C: No results for input(s): HGBA1C in the last 72  hours. CBG: No results for input(s): GLUCAP in the last 168 hours. Lipid Profile:  Recent Labs  10/15/16 0545  CHOL 125  HDL 44  LDLCALC 67  TRIG 69  CHOLHDL 2.8   Thyroid Function Tests: No results for input(s): TSH, T4TOTAL, FREET4, T3FREE, THYROIDAB in the last 72 hours. Anemia Panel: No results for input(s): VITAMINB12, FOLATE, FERRITIN, TIBC, IRON, RETICCTPCT in the last 72 hours. Urine analysis:    Component Value Date/Time   COLORURINE YELLOW 10/14/2016 1357   APPEARANCEUR HAZY (A) 10/14/2016 1357   LABSPEC 1.018 10/14/2016 1357   PHURINE 5.0 10/14/2016 1357   GLUCOSEU NEGATIVE 10/14/2016 1357   GLUCOSEU NEGATIVE 10/15/2014 0828   HGBUR SMALL (A) 10/14/2016 1357   BILIRUBINUR NEGATIVE 10/14/2016 1357   BILIRUBINUR negative 10/16/2015 0857   KETONESUR 20 (A) 10/14/2016 1357   PROTEINUR >=300 (A) 10/14/2016 1357   UROBILINOGEN 0.2 10/16/2015 0857   UROBILINOGEN 1.0 10/15/2014 0828   NITRITE NEGATIVE 10/14/2016 1357   LEUKOCYTESUR NEGATIVE 10/14/2016 1357   Sepsis Labs: _0 (procalcitonin:4,lacticidven:4)  ) Recent Results (from the past 240 hour(s))  Culture, blood (routine x 2)     Status: None (Preliminary result)   Collection Time: 10/14/16  2:00 PM  Result Value Ref Range Status   Specimen Description BLOOD LEFT HAND  Final   Special Requests IN PEDIATRIC BOTTLE Blood Culture adequate volume  Final   Culture NO GROWTH < 24 HOURS  Final   Report Status PENDING  Incomplete  Culture, blood (routine x 2)     Status: None (Preliminary result)   Collection Time: 10/14/16  2:13 PM  Result Value Ref Range Status   Specimen Description BLOOD LEFT WRIST  Final   Special Requests IN PEDIATRIC BOTTLE Blood Culture adequate volume  Final   Culture  Setup Time   Final    GRAM POSITIVE COCCI IN CLUSTERS IN PEDIATRIC BOTTLE CRITICAL RESULT CALLED TO, READ BACK BY AND VERIFIED WITH: A JOHNSTON,PHARMD AT 1053 10/15/16 BY L BENFIELD    Culture GRAM POSITIVE COCCI   Final   Report Status PENDING  Incomplete  Blood Culture ID Panel (Reflexed)     Status: Abnormal   Collection Time: 10/14/16  2:13 PM  Result Value Ref Range Status   Enterococcus species NOT DETECTED NOT DETECTED Final   Listeria monocytogenes NOT DETECTED NOT DETECTED Final   Staphylococcus species DETECTED (A) NOT DETECTED Final    Comment: Methicillin (oxacillin) susceptible coagulase negative staphylococcus. Possible blood culture contaminant (unless isolated from more than one blood culture draw or clinical case suggests pathogenicity).  No antibiotic treatment is indicated for blood  culture contaminants. CRITICAL RESULT CALLED TO, READ BACK BY AND VERIFIED WITH: A JOHNSTON,PHARMD AT 1053 10/15/16 BY L BENFIELD    Staphylococcus aureus NOT DETECTED NOT DETECTED Final   Methicillin resistance NOT DETECTED NOT DETECTED Final   Streptococcus species NOT DETECTED NOT DETECTED Final   Streptococcus agalactiae NOT DETECTED NOT DETECTED Final   Streptococcus pneumoniae NOT DETECTED NOT DETECTED Final   Streptococcus pyogenes NOT DETECTED NOT DETECTED Final   Acinetobacter baumannii NOT DETECTED NOT DETECTED Final   Enterobacteriaceae species NOT DETECTED NOT DETECTED Final   Enterobacter cloacae complex NOT DETECTED NOT DETECTED Final   Escherichia coli NOT DETECTED NOT DETECTED Final   Klebsiella oxytoca NOT DETECTED NOT DETECTED Final   Klebsiella pneumoniae NOT DETECTED NOT DETECTED Final   Proteus species NOT DETECTED NOT DETECTED Final   Serratia marcescens NOT DETECTED NOT DETECTED Final   Haemophilus influenzae NOT DETECTED NOT DETECTED Final   Neisseria meningitidis NOT DETECTED NOT DETECTED Final   Pseudomonas aeruginosa NOT DETECTED NOT DETECTED Final   Candida albicans NOT DETECTED NOT DETECTED Final   Candida glabrata NOT DETECTED NOT DETECTED Final   Candida krusei NOT DETECTED NOT DETECTED Final   Candida parapsilosis NOT DETECTED NOT DETECTED Final   Candida tropicalis  NOT DETECTED NOT DETECTED Final         Radiology Studies: Ct Angio Head W Or Wo Contrast  Result Date: 10/15/2016 CLINICAL DATA:  83 year old male with confusion, altered mental status. Age indeterminate Right PICA territory infarct on head CT yesterday. EXAM: CT ANGIOGRAPHY HEAD AND NECK TECHNIQUE: Multidetector CT imaging of the head and neck was performed using the standard protocol during bolus administration of intravenous contrast. Multiplanar CT image reconstructions and MIPs were obtained to evaluate the vascular anatomy. Carotid stenosis measurements (when applicable) are obtained utilizing NASCET criteria, using the distal internal carotid diameter as the denominator. CONTRAST:  50 mL Isovue 370 COMPARISON:  Head CT without contrast 10/14/2016. Cervical spine MRI 11/06/2011. 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 Posterior fusion hardware at C7-T1 with evidence of posterior element ankylosis at that level. Bulky anterior endplate osteophytosis without ankylosis at C2-C3. Bulky partially calcified ligamentous hypertrophy about the odontoid. No acute osseous abnormality identified. Upper chest: Layering bilateral pleural effusions, small to moderate and greater on the left. Mild compressive atelectasis greater on the left. No superior mediastinal lymphadenopathy. Other neck: Coarsely calcified benign appearing left thyroid nodule. Bulky 14 mm right submandibular gland sialolith. Otherwise negative. Aortic arch: Pulmonary artery dominant contrast bolus timing. Visible central pulmonary arteries are patent. Mild aortic arch calcified atherosclerosis. No great vessel origin stenosis. Right carotid system: Mild right CCA plaque without stenosis. Minimal right carotid bifurcation plaque. Negative cervical right ICA. Left carotid system: Mild left CCA plaque without stenosis. Mild soft and calcified plaque at the left  carotid bifurcation including the ICA origin and bulb without stenosis. Otherwise negative cervical left ICA. Vertebral arteries: No proximal right subclavian artery stenosis. Calcified plaque at the right vertebral artery origin with mild to moderate stenosis. Obscured proximal right V2 segment due to hardware streak artifact. The visible right V 2 segment is patent but non dominant. No definite additional right vertebral artery stenosis to the skullbase. No proximal left subclavian artery stenosis despite soft and calcified plaque. Bulky calcified plaque at the left vertebral artery origin with moderate to severe stenosis (series 9, image 175). Dominant left vertebral artery.  The proximal left V2 segment is obscured similar to that on the right. The from the visible left V2 segment the left vertebral artery is patent to the skullbase without stenosis. CTA HEAD Posterior circulation: Moderate to severe stenosis of the distal right vertebral artery as it crosses the dura is suspected (series 9, image 144), but the right V4 segment is patent. Bulky right V4 calcified plaque with severe stenosis just proximal to the vertebrobasilar junction (series 9, image 129). Bulky distal left vertebral artery calcified plaque also with moderate to severe stenosis proximal to the vertebrobasilar junction. The distal left vertebral artery is mildly dominant. Neither PICA is definitely identified. Patent vertebrobasilar junction and basilar artery. Basilar artery irregularity without stenosis. Irregular but patent bilateral SCA and PCA origins. Mild to moderate bilateral PCA origins stenosis. Bilateral PCA branches are within normal limits. Anterior circulation: Both ICA siphons are patent, but there is severe siphon calcified plaque worse on the right where severe distal cavernous and proximal supraclinoid stenosis occurs (series 8 image 114. Moderate to severe left ICA siphon stenosis in the same segments. Despite this both  carotid termini are patent. Left ACA A1 segment appears dominant and is patent. Anterior communicating artery is patent. However, the right ACA A2 segment is occluded just beyond its origin. The left ACA remains patent. Left MCA M1 segment and bifurcation are patent. No left MCA branch occlusion identified. No M1 stenosis. Right MCA M1 segment and bifurcation are patent. No definite right MCA branch occlusion although the anterior right MCA division is attenuated. Venous sinuses: Patent on the delayed images. Anatomic variants: Dominant left vertebral artery. Delayed phase: Stable appearance of hypodensity in the inferior right cerebellum since the CT yesterday. Patchy and confluent bilateral cerebral white matter hypodensity. Heterogeneity in the basal ganglia worse on the right. No new cortically based infarct identified. No midline shift, mass effect, or evidence of intracranial mass lesion. No acute intracranial hemorrhage identified. No ventriculomegaly. No abnormal enhancement identified. Review of the MIP images confirms the above findings IMPRESSION: 1. No emergent large vessel occlusion. 2. Age indeterminate but probably chronic (See #7) right ACA occlusion beginning at the A2 segment. 3. Mild extracranial but severe carotid siphon atherosclerosis and stenosis. Bulky calcified plaque results in severe right and moderate to severe left ICA stenosis at the distal cavernous and/or supraclinoid segments. Both carotid termini remain patent. 4. Anterior division right MCA branches appear attenuated, but no discrete branch occlusion or proximal MCA stenosis is identified. 5. Severe posterior circulation atherosclerosis with severe distal right vertebral artery stenosis in the posterior fossa, and moderate to severe stenosis of the dominant left vertebral artery at both its origin and distal segment. 6. Up to moderate stenosis at both PCA origins. 7. Stable CT appearance of the brain since yesterday. Unchanged  appearance of the right PICA territory infarct which is age indeterminate. And no other acute cortically based infarct has developed. 8. Small to moderate left greater than right layering pleural effusions. 9. Widespread chronic cervical spine ankylosis and postoperative arthrodesis at the cervicothoracic junction. Electronically Signed   By: Genevie Ann M.D.   On: 10/15/2016 11:37   Ct Head Wo Contrast  Result Date: 10/14/2016 CLINICAL DATA:  Altered mental status EXAM: CT HEAD WITHOUT CONTRAST TECHNIQUE: Contiguous axial images were obtained from the base of the skull through the vertex without intravenous contrast. COMPARISON:  October 18, 2011 FINDINGS: Brain: There is moderate diffuse atrophy. There is a focal area of decreased attenuation in the inferior, posterior  aspect of the right cerebellum, likely a recent infarct involving a portion of the right posterior inferior cerebral artery distribution. No other evidence of recent/acute infarct. There is patchy small vessel disease throughout the centra semiovale bilaterally. There is evidence of prior infarct involving a portion of the head of the caudate nucleus on the right and anterior limb of the right internal capsule. There is no well-defined mass, hemorrhage, extra-axial fluid collection, or midline shift. Vascular: There is no appreciable hyperdense vessel. There is calcification in each carotid siphon region. There is also calcification in each distal vertebral artery as well as in the right carotid artery in the petrous region. Skull: Bones are osteoporotic.  Bony calvarium appears intact. Sinuses/Orbits: There is opacification of multiple ethmoid air cells the right. There is mucosal thickening throughout multiple ethmoid air cells as well. There is mild mucosal thickening inferior left maxillary antrum anteriorly. There is mucosal thickening in a portion of the left sphenoid sinus. Orbits appear symmetric bilaterally. Other: Mastoid air cells are clear.  IMPRESSION: Findings felt to represent an acute/recent infarct in the distribution of a portion of the right posterior inferior cerebellar artery. There is atrophy with extensive supratentorial small vessel disease. Prior infarct noted involving the head of the caudate nucleus on the right as well as a portion of the anterior limb of the right internal capsule. No hemorrhage.  No extra-axial fluid. Extensive arteriovascular calcification. Foci of paranasal sinus disease, most pronounced in the right ethmoid air cell complex. Electronically Signed   By: Lowella Grip III M.D.   On: 10/14/2016 14:48   Ct Angio Neck W Or Wo Contrast  Result Date: 10/15/2016 CLINICAL DATA:  83 year old male with confusion, altered mental status. Age indeterminate Right PICA territory infarct on head CT yesterday. EXAM: CT ANGIOGRAPHY HEAD AND NECK TECHNIQUE: Multidetector CT imaging of the head and neck was performed using the standard protocol during bolus administration of intravenous contrast. Multiplanar CT image reconstructions and MIPs were obtained to evaluate the vascular anatomy. Carotid stenosis measurements (when applicable) are obtained utilizing NASCET criteria, using the distal internal carotid diameter as the denominator. CONTRAST:  50 mL Isovue 370 COMPARISON:  Head CT without contrast 10/14/2016. Cervical spine MRI 11/06/2011. 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 Posterior fusion hardware at C7-T1 with evidence of posterior element ankylosis at that level. Bulky anterior endplate osteophytosis without ankylosis at C2-C3. Bulky partially calcified ligamentous hypertrophy about the odontoid. No acute osseous abnormality identified. Upper chest: Layering bilateral pleural effusions, small to moderate and greater on the left. Mild compressive atelectasis greater on the left. No superior mediastinal lymphadenopathy. Other neck:  Coarsely calcified benign appearing left thyroid nodule. Bulky 14 mm right submandibular gland sialolith. Otherwise negative. Aortic arch: Pulmonary artery dominant contrast bolus timing. Visible central pulmonary arteries are patent. Mild aortic arch calcified atherosclerosis. No great vessel origin stenosis. Right carotid system: Mild right CCA plaque without stenosis. Minimal right carotid bifurcation plaque. Negative cervical right ICA. Left carotid system: Mild left CCA plaque without stenosis. Mild soft and calcified plaque at the left carotid bifurcation including the ICA origin and bulb without stenosis. Otherwise negative cervical left ICA. Vertebral arteries: No proximal right subclavian artery stenosis. Calcified plaque at the right vertebral artery origin with mild to moderate stenosis. Obscured proximal right V2 segment due to hardware streak artifact. The visible right V 2 segment is patent but non dominant. No definite additional right vertebral artery stenosis to the  skullbase. No proximal left subclavian artery stenosis despite soft and calcified plaque. Bulky calcified plaque at the left vertebral artery origin with moderate to severe stenosis (series 9, image 175). Dominant left vertebral artery. The proximal left V2 segment is obscured similar to that on the right. The from the visible left V2 segment the left vertebral artery is patent to the skullbase without stenosis. CTA HEAD Posterior circulation: Moderate to severe stenosis of the distal right vertebral artery as it crosses the dura is suspected (series 9, image 144), but the right V4 segment is patent. Bulky right V4 calcified plaque with severe stenosis just proximal to the vertebrobasilar junction (series 9, image 129). Bulky distal left vertebral artery calcified plaque also with moderate to severe stenosis proximal to the vertebrobasilar junction. The distal left vertebral artery is mildly dominant. Neither PICA is definitely  identified. Patent vertebrobasilar junction and basilar artery. Basilar artery irregularity without stenosis. Irregular but patent bilateral SCA and PCA origins. Mild to moderate bilateral PCA origins stenosis. Bilateral PCA branches are within normal limits. Anterior circulation: Both ICA siphons are patent, but there is severe siphon calcified plaque worse on the right where severe distal cavernous and proximal supraclinoid stenosis occurs (series 8 image 114. Moderate to severe left ICA siphon stenosis in the same segments. Despite this both carotid termini are patent. Left ACA A1 segment appears dominant and is patent. Anterior communicating artery is patent. However, the right ACA A2 segment is occluded just beyond its origin. The left ACA remains patent. Left MCA M1 segment and bifurcation are patent. No left MCA branch occlusion identified. No M1 stenosis. Right MCA M1 segment and bifurcation are patent. No definite right MCA branch occlusion although the anterior right MCA division is attenuated. Venous sinuses: Patent on the delayed images. Anatomic variants: Dominant left vertebral artery. Delayed phase: Stable appearance of hypodensity in the inferior right cerebellum since the CT yesterday. Patchy and confluent bilateral cerebral white matter hypodensity. Heterogeneity in the basal ganglia worse on the right. No new cortically based infarct identified. No midline shift, mass effect, or evidence of intracranial mass lesion. No acute intracranial hemorrhage identified. No ventriculomegaly. No abnormal enhancement identified. Review of the MIP images confirms the above findings IMPRESSION: 1. No emergent large vessel occlusion. 2. Age indeterminate but probably chronic (See #7) right ACA occlusion beginning at the A2 segment. 3. Mild extracranial but severe carotid siphon atherosclerosis and stenosis. Bulky calcified plaque results in severe right and moderate to severe left ICA stenosis at the distal  cavernous and/or supraclinoid segments. Both carotid termini remain patent. 4. Anterior division right MCA branches appear attenuated, but no discrete branch occlusion or proximal MCA stenosis is identified. 5. Severe posterior circulation atherosclerosis with severe distal right vertebral artery stenosis in the posterior fossa, and moderate to severe stenosis of the dominant left vertebral artery at both its origin and distal segment. 6. Up to moderate stenosis at both PCA origins. 7. Stable CT appearance of the brain since yesterday. Unchanged appearance of the right PICA territory infarct which is age indeterminate. And no other acute cortically based infarct has developed. 8. Small to moderate left greater than right layering pleural effusions. 9. Widespread chronic cervical spine ankylosis and postoperative arthrodesis at the cervicothoracic junction. Electronically Signed   By: Genevie Ann M.D.   On: 10/15/2016 11:37   Dg Chest Portable 1 View  Result Date: 10/14/2016 CLINICAL DATA:  Acute stroke with shortness of breath. EXAM: PORTABLE CHEST 1 VIEW COMPARISON:  11/15/2015 FINDINGS:  1543 hours. The cardio pericardial silhouette is enlarged. Insert basilar atelectasis. No airspace pulmonary edema or focal lung consolidation. No substantial pleural effusion. Left permanent pacemaker again noted. Degenerative changes evident and right shoulder. Telemetry leads overlie the chest. IMPRESSION: Cardiomegaly with basilar atelectasis. Electronically Signed   By: Misty Stanley M.D.   On: 10/14/2016 15:48        Scheduled Meds: . doxycycline  100 mg Oral Q12H  . enoxaparin (LOVENOX) injection  30 mg Subcutaneous Q24H   Continuous Infusions: . sodium chloride 75 mL/hr at 10/15/16 1627     LOS: 1 day    Time spent: [place], MD [facility] ([phone]   If 7PM-7AM, please contact night-coverage [email or web address] Password [facility] 10/15/2016, 5:38 PM

## 2016-10-15 NOTE — Progress Notes (Signed)
Neurology Progress Note  Subjective: No major 24-hour events have been reported. The patient states that he is doing well and he has no complaints of the 10 point review of systems. He is somewhat confabulatory. He tells me that he went [employer] again today so that he can eat. From what I can see in the notes, his daughter had reported that he usually goes to [employer] every morning. He is very pleasantly confused.  Medications reviewed and reconciled.   Current Meds:   Current Facility-Administered Medications:  .  0.9 %  sodium chloride infusion, , Intravenous, Continuous, Samtani, Jai-Gurmukh, MD, Last Rate: 75 mL/hr at 10/15/16 1123 .  acetaminophen (TYLENOL) tablet 650 mg, 650 mg, Oral, Q4H PRN **OR** acetaminophen (TYLENOL) solution 650 mg, 650 mg, Per Tube, Q4H PRN **OR** acetaminophen (TYLENOL) suppository 650 mg, 650 mg, Rectal, Q4H PRN, Samtani, Jai-Gurmukh, MD .  doxycycline (VIBRA-TABS) tablet 100 mg, 100 mg, Oral, Q12H, Samtani, Jai-Gurmukh, MD, 100 mg at 10/15/16 1114 .  enoxaparin (LOVENOX) injection 30 mg, 30 mg, Subcutaneous, Q24H, Samtani, Jai-Gurmukh, MD, 30 mg at 10/14/16 1959  Objective:  Temp:  [97.7 F (36.5 C)-99.1 F (37.3 C)] 98.4 F (36.9 C) (06/08 1419) Pulse Rate:  [70-76] 73 (06/08 1419) Resp:  [13-19] 19 (06/08 1419) BP: (126-147)/(67-101) 133/91 (06/08 1419) SpO2:  [97 %-100 %] 99 % (06/08 1419)  General: WD thin African-American man resting in bed in NAD. Alert, oriented to self only. Speech is notable for mild dysarthria. No overt aphasia. Affect is bright. Comportment is normal. He is mildly confabulatory. He is easily distracted. HEENT: Neck is supple without lymphadenopathy. Mucous membranes are moist and the oropharynx is clear. Sclerae are anicteric. There is no conjunctival injection.  CV: Regular, no murmur. Carotid pulses are 2+ and symmetric with no bruits. Distal pulses 2+ and symmetric.  Lungs: CTAB  Extremities: No C/C/E. Neuro: MS: As  noted above.  CN: Pupils are equal and reactive from 3-->2 mm bilaterally. EOMI, no nystagmus. There is breakup of smooth pursuits in all directions of gaze. Facial sensation is intact to light touch. Face is symmetric at rest with normal strength and mobility. Hearing is intact to conversational voice. Voice is normal in tone and quality. Palate elevates symmetrically. Uvula is midline. Bilateral SCM and trapezii are 5/5. Tongue is midline with normal bulk and mobility.  Motor: Normal bulk, tone, and strength throughout. He has mitgehen paratonia. No pronator drift. No tremor or other abnormal movements are observed.  Sensation: Intact to light touch.  DTRs: 2+, symmetric. Toes are downgoing bilaterally. No pathological reflexes.  Coordination: Finger-to-nose is slow but without dysmetria bilaterally.    Labs: Lab Results  Component Value Date   WBC 8.8 10/14/2016   HGB 16.7 10/14/2016   HCT 49.0 10/14/2016   PLT 182 10/14/2016   GLUCOSE 178 (H) 10/14/2016   CHOL 125 10/15/2016   TRIG 69 10/15/2016   HDL 44 10/15/2016   LDLDIRECT 79.0 12/24/2013   LDLCALC 67 10/15/2016   ALT 16 (L) 10/14/2016   AST 25 10/14/2016   NA 141 10/14/2016   K 4.5 10/14/2016   CL 106 10/14/2016   CREATININE 1.10 10/14/2016   BUN 20 10/14/2016   CO2 20 (L) 10/14/2016   TSH 2.380 11/13/2015   INR 1.07 11/18/2011   HGBA1C 7.1 (H) 09/01/2016   MICROALBUR 14.6 (H) 09/01/2016   CBC Latest Ref Rng & Units 10/14/2016 10/14/2016 09/01/2016  WBC 4.0 - 10.5 K/uL - 8.8 5.0  Hemoglobin 13.0 - 17.0  g/dL 16.7 14.3 13.1  Hematocrit 39.0 - 52.0 % 49.0 42.4 39.7  Platelets 150 - 400 K/uL - 182 173.0    Lab Results  Component Value Date   HGBA1C 7.1 (H) 09/01/2016   Lab Results  Component Value Date   ALT 16 (L) 10/14/2016   AST 25 10/14/2016   ALKPHOS 110 10/14/2016   BILITOT 1.5 (H) 10/14/2016   Blood cultures from 10/14/16 growing gram-positive cocci in clusters and one bottle, likely contaminant  Radiology:   I have personally and independently reviewed CT angiogram of the head and neck from today. This shows extensive atherosclerotic changes throughout his circulation. No acute large vessel occlusions are noted. 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 Moderate to severe stenosis is seen in the left vertebral artery both proximally and distally. The brain itself appears to be unchanged with no acute ischemia identified.  A/P:   1. Acute encephalopathy: He is here with worsening confusion. I don't know exactly what his current baseline is. He does have pretty significant background dementia and I wonder if this simply represents progression of his disease. I don't see anything that really suggests that he has had a stroke with no interval changes on repeat CTH today and no focal deficits on his examination. He is unable to have an MRI scan because of his pacemaker. At this point, continue to optimize metabolic status as you are. Continue to limit CNS active medications, particularly benzodiazepines, opiates, and anything with strong anticholinergic properties. Normalize sleep-wake cycles, keeping his room bright and active during the day and quiet and dark during the night.  2. Cerebrovascular disease: He has significant chronic small vessel ischemic disease but no evidence of acute stroke. Continue risk factor modification. Recommend addition of aspirin. Lipids are currently at goal so would defer statin.  3. Dementia: He has a known history of dementia. Speech therapy has assessed the patient and he had a MoCA score of 5/30. This is a significant decline compared to the previous score of 18/30 in May 2017. His dementia places him at risk for encephalopathy and delirium from all causes. It would also predict delayed recovery from any such encephalopathy as well. Supportive care at this time. Given his advanced dementia, usual pharmacologic interventions are less  likely to be beneficial. This can be evaluated further by his outpatient neurologist. It is clear that the patient would not be safe to drive and I would strongly urge that he no longer be allowed behind the wheel.  No family is present at the bedside.  Melba Coon, MD [facility]

## 2016-10-16 LAB — CULTURE, BLOOD (ROUTINE X 2): Special Requests: ADEQUATE

## 2016-10-16 LAB — ROCKY MTN SPOTTED FVR ABS PNL(IGG+IGM)
RMSF IGG: NEGATIVE
RMSF IGM: 0.34 {index} (ref 0.00–0.89)

## 2016-10-16 LAB — GLUCOSE, CAPILLARY
GLUCOSE-CAPILLARY: 341 mg/dL — AB (ref 65–99)
GLUCOSE-CAPILLARY: 333 mg/dL — AB (ref 65–99)
Glucose-Capillary: 92 mg/dL (ref 65–99)

## 2016-10-16 LAB — HEMOGLOBIN A1C
Hgb A1c MFr Bld: 7.5 % — ABNORMAL HIGH (ref 4.8–5.6)
MEAN PLASMA GLUCOSE: 169 mg/dL

## 2016-10-16 MED ORDER — METFORMIN HCL ER 500 MG PO TB24
500.0000 mg | ORAL_TABLET | Freq: Every day | ORAL | Status: DC
  Administered 2016-10-17 – 2016-10-18 (×2): 500 mg via ORAL
  Filled 2016-10-16 (×2): qty 1

## 2016-10-16 MED ORDER — INSULIN GLARGINE 100 UNIT/ML ~~LOC~~ SOLN
20.0000 [IU] | Freq: Every day | SUBCUTANEOUS | Status: DC
  Administered 2016-10-16 – 2016-10-18 (×3): 20 [IU] via SUBCUTANEOUS
  Filled 2016-10-16 (×3): qty 0.2

## 2016-10-16 MED ORDER — METFORMIN HCL ER 500 MG PO TB24: 1000.0000 mg | ORAL_TABLET | Freq: Every day | ORAL | Status: DC

## 2016-10-16 NOTE — Evaluation (Signed)
Physical Therapy Evaluation Patient Details Name: [name] MRN: [id number] DOB: 1931/03/05 Today's Date: 10/16/2016   History of Present Illness  This is an 83 year old man brought to the emergency room for evaluation of altered mental status. History is limited to review the patient's medical record. He is unable to provide much in the way of useful information. No family is present at the bedside pt has a history of moderate-severe cognitive impairment 5/17 with [facility] and lives with family at this time; CT head on 10/14/16 indicated findings felt to represent an acute/recent infarct in the distribution of a portion of the right posterior inferior cerebellar artery.  Clinical Impression  Pt admitted with/for AMS and now requiring max assist, preferably of 2 persons.  Pt currently limited functionally due to the problems listed. ( See problems list.)   Pt will benefit from PT to maximize function and safety in order to get ready for next venue listed below.     Follow Up Recommendations CIR    Equipment Recommendations  None recommended by PT    Recommendations for Other Services Rehab consult     Precautions / Restrictions Precautions Precautions: Fall      Mobility  Bed Mobility               General bed mobility comments: up in the chair on arrival.  Not ready to return.  Transfers Overall transfer level: Needs assistance Equipment used: Rolling walker (2 wheeled) Transfers: Sit to/from Stand Sit to Stand: Max assist (2 person assist not available )         General transfer comment: pt required maximal multimodal cuing to prepare to stand including for scoot to edge of chair.  Feet had to be blocked and pt assist to come significantly further than he wished to come.  Pt needed both forward and boost assist best managed by 2 persons.  Pt unable to get his trunk up over his BOS/feet..  pt likely would have done better with face to face assist and not with  RW today  Ambulation/Gait             General Gait Details: pt unable  Stairs            Wheelchair Mobility    Modified Rankin (Stroke Patients Only) Modified Rankin (Stroke Patients Only) Pre-Morbid Rankin Score: No symptoms Modified Rankin: Severe disability     Balance Overall balance assessment: Needs assistance Sitting-balance support: Feet supported;Single extremity supported Sitting balance-Leahy Scale: Poor Sitting balance - Comments: pt tended to list left and needed external assist to stay upright. Postural control: Posterior lean;Left lateral lean Standing balance support: Bilateral upper extremity supported Standing balance-Leahy Scale: Zero                               Pertinent Vitals/Pain Pain Assessment: Faces Faces Pain Scale: No hurt    Home Living Family/patient expects to be discharged to:: Private residence Living Arrangements: Children Available Help at Discharge: Family;Available PRN/intermittently Type of Home: House Home Access: Stairs to enter Entrance Stairs-Rails: Right;Left;Can reach both Entrance Stairs-Number of Steps: 3 Home Layout: One level Home Equipment: Cane - quad;Hand held shower head;Grab bars - tub/shower;Shower seat Additional Comments: Information based on prior admission as pt is poor historian    Prior Function Level of Independence: Independent with assistive device(s)         Comments: used cane, drove himself, daughter took care  of home management     Hand Dominance   Dominant Hand: Right    Extremity/Trunk Assessment   Upper Extremity Assessment Upper Extremity Assessment: Defer to OT evaluation (functional strength in the horizontal plane shoulders <90*)    Lower Extremity Assessment Lower Extremity Assessment: RLE deficits/detail;LLE deficits/detail RLE Deficits / Details: R LE stronger than L LE and generally functional RLE Coordination: decreased fine motor LLE Deficits /  Details: generally weak, not as well coordinated during check of movement quallity.  pt unable to conceptualize heel/shin test. LLE Coordination: decreased fine motor       Communication      Cognition Arousal/Alertness: Lethargic Behavior During Therapy: Flat affect Overall Cognitive Status: Impaired/Different from baseline Area of Impairment: Safety/judgement;Following commands;Orientation;Problem solving;Awareness                 Orientation Level: Disoriented to;Place;Time;Situation     Following Commands: Follows one step commands consistently;Follows one step commands with increased time Safety/Judgement: Decreased awareness of safety;Decreased awareness of deficits Awareness: Intellectual Problem Solving: Slow processing;Decreased initiation;Difficulty sequencing;Requires verbal cues;Requires tactile cues        General Comments General comments (skin integrity, edema, etc.): pt lethargic, disoriented and having trouble sequencing and focusing on task.    Exercises     Assessment/Plan    PT Assessment Patient needs continued PT services  PT Problem List Decreased strength;Decreased activity tolerance;Decreased balance;Decreased mobility;Decreased coordination;Decreased knowledge of use of DME       PT Treatment Interventions DME instruction;Gait training;Functional mobility training;Therapeutic activities;Balance training;Neuromuscular re-education;Patient/family education    PT Goals (Current goals can be found in the Care Plan section)  Acute Rehab PT Goals PT Goal Formulation: With patient Time For Goal Achievement: 10/30/16 Potential to Achieve Goals: Fair    Frequency Min 3X/week   Barriers to discharge        Co-evaluation               AM-PAC PT "6 Clicks" Daily Activity  Outcome Measure Difficulty turning over in bed (including adjusting bedclothes, sheets and blankets)?: Total Difficulty moving from lying on back to sitting on the  side of the bed? : Total Difficulty sitting down on and standing up from a chair with arms (e.g., wheelchair, bedside commode, etc,.)?: Total Help needed moving to and from a bed to chair (including a wheelchair)?: A Lot Help needed walking in hospital room?: A Lot Help needed climbing 3-5 steps with a railing? : Total 6 Click Score: 8    End of Session   Activity Tolerance: Patient tolerated treatment well Patient left: in chair;with call bell/phone within reach;with chair alarm set Nurse Communication: Mobility status PT Visit Diagnosis: Unsteadiness on feet (R26.81);Muscle weakness (generalized) (M62.81);Other symptoms and signs involving the nervous system (R29.898)    Time: 7902-4097 PT Time Calculation (min) (ACUTE ONLY): 21 min   Charges:   PT Evaluation $PT Eval Moderate Complexity: 1 Procedure     PT G Codes:        October 20, 2016  Donnella Sham, PT [phone]  (pager)  Tessie Fass Harless Molinari 10/20/16, 2:54 PM

## 2016-10-16 NOTE — Progress Notes (Signed)
Neurology Progress Note  Subjective: No major 24-hour events have been reported. The patient states has no complaints on a 10 point review of systems.   Medications reviewed and reconciled.   Current Meds:   Current Facility-Administered Medications:  .  0.9 %  sodium chloride infusion, , Intravenous, Continuous, Samtani, Jai-Gurmukh, MD, Last Rate: 75 mL/hr at 10/15/16 1627 .  acetaminophen (TYLENOL) tablet 650 mg, 650 mg, Oral, Q4H PRN **OR** acetaminophen (TYLENOL) solution 650 mg, 650 mg, Per Tube, Q4H PRN **OR** acetaminophen (TYLENOL) suppository 650 mg, 650 mg, Rectal, Q4H PRN, Samtani, Jai-Gurmukh, MD .  doxycycline (VIBRA-TABS) tablet 100 mg, 100 mg, Oral, Q12H, Samtani, Jai-Gurmukh, MD, 100 mg at 10/16/16 1043 .  enoxaparin (LOVENOX) injection 30 mg, 30 mg, Subcutaneous, Q24H, Samtani, Jai-Gurmukh, MD, 30 mg at 10/15/16 2116 .  insulin aspart (novoLOG) injection 0-9 Units, 0-9 Units, Subcutaneous, TID WC, Samtani, Jai-Gurmukh, MD .  insulin aspart (novoLOG) injection 3 Units, 3 Units, Subcutaneous, TID WC, Samtani, Jai-Gurmukh, MD  Objective:  Temp:  [98 F (36.7 C)-99 F (37.2 C)] 98.3 F (36.8 C) (06/09 0940) Pulse Rate:  [72-80] 80 (06/09 0940) Resp:  [18-20] 18 (06/09 0940) BP: (126-145)/(77-95) 130/77 (06/09 0940) SpO2:  [97 %-100 %] 99 % (06/09 0940)  General: WD thin African-American man sitting up in chair in NAD. Alert, oriented to self only. Speech is notable for mild dysarthria. No overt aphasia. Affect is bright. Comportment is normal. He is mildly confabulatory.  HEENT: Neck is supple without lymphadenopathy. Mucous membranes are moist and the oropharynx is clear. Sclerae are anicteric. There is no conjunctival injection.  CV: Regular, no murmur. Carotid pulses are 2+ and symmetric with no bruits. Distal pulses 2+ and symmetric.  Lungs: CTAB  Extremities: No C/C/E. Neuro: MS: As noted above.  CN: Pupils are equal and reactive from 3-->2 mm bilaterally. EOMI, no  nystagmus. There is breakup of smooth pursuits in all directions of gaze. Facial sensation is intact to light touch. Face is symmetric at rest with normal strength and mobility. Hearing is intact to conversational voice. Voice is normal in tone and quality. Palate elevates symmetrically. Uvula is midline. Bilateral SCM and trapezii are 5/5. Tongue is midline with normal bulk and mobility.  Motor: Normal bulk, tone, and strength throughout. He has mitgehen paratonia. No pronator drift. No tremor or other abnormal movements are observed.  Sensation: Intact to light touch.  DTRs: 2+, symmetric. Toes are downgoing bilaterally. No pathological reflexes.  Coordination: Finger-to-nose is slow but without dysmetria bilaterally.    Labs: Lab Results  Component Value Date   WBC 8.8 10/14/2016   HGB 16.7 10/14/2016   HCT 49.0 10/14/2016   PLT 182 10/14/2016   GLUCOSE 178 (H) 10/14/2016   CHOL 125 10/15/2016   TRIG 69 10/15/2016   HDL 44 10/15/2016   LDLDIRECT 79.0 12/24/2013   LDLCALC 67 10/15/2016   ALT 16 (L) 10/14/2016   AST 25 10/14/2016   NA 141 10/14/2016   K 4.5 10/14/2016   CL 106 10/14/2016   CREATININE 1.10 10/14/2016   BUN 20 10/14/2016   CO2 20 (L) 10/14/2016   TSH 2.380 11/13/2015   INR 1.07 11/18/2011   HGBA1C 7.5 (H) 10/15/2016   MICROALBUR 14.6 (H) 09/01/2016   CBC Latest Ref Rng & Units 10/14/2016 10/14/2016 09/01/2016  WBC 4.0 - 10.5 K/uL - 8.8 5.0  Hemoglobin 13.0 - 17.0 g/dL 16.7 14.3 13.1  Hematocrit 39.0 - 52.0 % 49.0 42.4 39.7  Platelets 150 - 400 K/uL -  182 173.0    Lab Results  Component Value Date   HGBA1C 7.5 (H) 10/15/2016   Lab Results  Component Value Date   ALT 16 (L) 10/14/2016   AST 25 10/14/2016   ALKPHOS 110 10/14/2016   BILITOT 1.5 (H) 10/14/2016   Blood cultures from 10/14/16 growing gram-positive cocci in clusters and one bottle, likely contaminant  Radiology:  No new neuroimaging.  A/P:   1. Acute encephalopathy: No obvious etiologies for  increased confusion has been identified. This may simply be reflective of his underlying dementia and its progression. Bouts of confusion can occur in those with advanced dementia even without clear provocation, and sometimes a provocation is so minor as to be difficult to identify. Continue to optimize metabolic status as you are. Continue to limit CNS active medications, particularly benzodiazepines, opiates, and anything with strong anticholinergic properties. Normalize sleep-wake cycles, keeping his room bright and active during the day and quiet and dark during the night.  2. Cerebrovascular disease: He has significant chronic small vessel ischemic disease but no evidence of acute stroke. Continue risk factor modification. Recommend addition of aspirin. Lipids are currently at goal so would defer statin.  3. Dementia: He has a known history of dementia. This is severe. His dementia places him at risk for encephalopathy and delirium from all causes. It would also predict delayed recovery from any such encephalopathy as well. Supportive care at this time. Given his advanced dementia, usual pharmacologic interventions are less likely to be beneficial. This can be evaluated further by his outpatient neurologist. The patient should no longer be allowed to drive given his dementia. I have concerns is about his ability to take care of himself and he is likely to require constant supervision.   No family is present at the bedside.  Melba Coon, MD [facility]

## 2016-10-16 NOTE — Progress Notes (Signed)
PROGRESS NOTE    [name]  NFA:[id number] DOB: March 24, 1931 DOA: 10/14/2016 PCP: Elayne Snare, MD  Outpatient Specialists:     Brief Narrative:    83 y/o ? community dweller H/o CHB s/p St Jude PPm 11/2015             LBBB DM ty II last I6N 7.1 complicated by peripheral neuropathy and onychogryphosis CKD stg I chf systolic Ef 62-95% 06/8411 htn Prostate cancer with XRT Prior h/o syncope with admission 2008 Cervical stenosis + myelopathy s/p CL/PCK C7-T1 11/20/11 Carpakl tunnel synd Alzhemier's   admmit with toxic met enceohalopathy ? storke vs other casues   Labs on admit slight  ? bun/creat 20/1.2-->18/1.3 Troponin 0.16 Lactic acid 2.4 CT scan showing subacute infarct on scan Chest x-ray shows cardiomegaly with mild vascular congestion EKG shows AV dual paced rhythm at around 60 without any further interpretation possible    Assessment & Plan:   Active Problems:   Stroke (cerebrum) (HCC) Alzhemier's -? Rapidly cycling? DDX delirium-has waxing and waning answers to orienting questions, note SLP MOCA Doesn't seem to be CVA per Neuro? CTA non convincing Rapid onset and drop MMSE18--5>/30 NO FURTHER WORK-up Ok for d.c to eventual SNF  H/o CHB s/p St Jude PPm 06/4399 chf systolic Ef 02-72% 09/3662              LBBB Continue aspirin 81 mg daily for now Outpatient EP re-eval as an outpatient  DM ty II last Q0H 7.1 complicated by peripheral neuropathy and onychogryphosis  Lantus on hold on admit-resumed lantus 20 am  metformin 1000 xl -->500 mgadded back 6/9  Sugars 178-344   CKD stg III Type B lactic acidosis secondary to metformin, not infection Might need to cut back dosing as OP-see above  Creatinine 1.3--1.1 with saline, continue IV saline 75 cc per hour.  htn Hold antihypertensives for now  Prostate cancer with XRT Outpatient follow-up  Prior h/o syncope with admission 2008 Cervical stenosis + myelopathy s/p CL/PCK C7-T1 11/20/11 Carpakl tunnel  synd  Lovenox for now Full code    Consultants:   neuro  Procedures:   CTA  Antimicrobials:    none    Subjective:  Confused pelasant no new issue Eating very well CBG ?  No pains Needed max assist to get up to bed   Objective: Vitals:   10/15/16 2139 10/16/16 0124 10/16/16 0445 10/16/16 0940  BP: (!) 131/95 (!) 145/90 134/79 130/77  Pulse: 78 72 75 80  Resp: _0 Temp: 98 F (36.7 C) 98.1 F (36.7 C) 98.1 F (36.7 C) 98.3 F (36.8 C)  TempSrc: Oral Oral Oral Oral  SpO2: 100% 100% 100% 99%    Intake/Output Summary (Last 24 hours) at 10/16/16 1148 Last data filed at 10/16/16 0100  Gross per 24 hour  Intake           783.75 ml  Output              400 ml  Net           383.75 ml   There were no vitals filed for this visit.  Examination:  Alert pleasant slightly confused eomi ncat cta b Arm raises as directed Power 5/5 Sensory hard to detemrine Reflexes 2/3   Data Reviewed: I have personally reviewed following labs and imaging studies  CBC:  Recent Labs Lab 10/14/16 1351 10/14/16 1444  WBC 8.8  --   NEUTROABS 7.5  --   HGB  14.3 16.7  HCT 42.4 49.0  MCV 94.0  --   PLT 182  --    Basic Metabolic Panel:  Recent Labs Lab 10/14/16 1351 10/14/16 1444  NA 139 141  K 4.6 4.5  CL 103 106  CO2 20*  --   GLUCOSE 176* 178*  BUN 18 20  CREATININE 1.32* 1.10  CALCIUM 9.4  --    GFR: CrCl cannot be calculated (Unknown ideal weight.). Liver Function Tests:  Recent Labs Lab 10/14/16 1351  AST 25  ALT 16*  ALKPHOS 110  BILITOT 1.5*  PROT 6.9  ALBUMIN 3.8   No results for input(s): LIPASE, AMYLASE in the last 168 hours. No results for input(s): AMMONIA in the last 168 hours. Coagulation Profile: No results for input(s): INR, PROTIME in the last 168 hours. Cardiac Enzymes: No results for input(s): CKTOTAL, CKMB, CKMBINDEX, TROPONINI in the last 168 hours. BNP (last 3 results) No results for input(s): PROBNP in the  last 8760 hours. HbA1C:  Recent Labs  10/15/16 0438  HGBA1C 7.5*   CBG:  Recent Labs Lab 10/15/16 2144 10/16/16 1131  GLUCAP 299* 341*   Lipid Profile:  Recent Labs  10/15/16 0545  CHOL 125  HDL 44  LDLCALC 67  TRIG 69  CHOLHDL 2.8   Thyroid Function Tests: No results for input(s): TSH, T4TOTAL, FREET4, T3FREE, THYROIDAB in the last 72 hours. Anemia Panel: No results for input(s): VITAMINB12, FOLATE, FERRITIN, TIBC, IRON, RETICCTPCT in the last 72 hours. Urine analysis:    Component Value Date/Time   COLORURINE YELLOW 10/14/2016 1357   APPEARANCEUR HAZY (A) 10/14/2016 1357   LABSPEC 1.018 10/14/2016 1357   PHURINE 5.0 10/14/2016 1357   GLUCOSEU NEGATIVE 10/14/2016 1357   GLUCOSEU NEGATIVE 10/15/2014 0828   HGBUR SMALL (A) 10/14/2016 1357   BILIRUBINUR NEGATIVE 10/14/2016 1357   BILIRUBINUR negative 10/16/2015 0857   KETONESUR 20 (A) 10/14/2016 1357   PROTEINUR >=300 (A) 10/14/2016 1357   UROBILINOGEN 0.2 10/16/2015 0857   UROBILINOGEN 1.0 10/15/2014 0828   NITRITE NEGATIVE 10/14/2016 1357   LEUKOCYTESUR NEGATIVE 10/14/2016 1357   Sepsis Labs: _0 (procalcitonin:4,lacticidven:4)  ) Recent Results (from the past 240 hour(s))  Culture, blood (routine x 2)     Status: None (Preliminary result)   Collection Time: 10/14/16  2:00 PM  Result Value Ref Range Status   Specimen Description BLOOD LEFT HAND  Final   Special Requests IN PEDIATRIC BOTTLE Blood Culture adequate volume  Final   Culture NO GROWTH < 24 HOURS  Final   Report Status PENDING  Incomplete  Culture, blood (routine x 2)     Status: None (Preliminary result)   Collection Time: 10/14/16  2:13 PM  Result Value Ref Range Status   Specimen Description BLOOD LEFT WRIST  Final   Special Requests IN PEDIATRIC BOTTLE Blood Culture adequate volume  Final   Culture  Setup Time   Final    GRAM POSITIVE COCCI IN CLUSTERS IN PEDIATRIC BOTTLE CRITICAL RESULT CALLED TO, READ BACK BY AND VERIFIED  WITH: A JOHNSTON,PHARMD AT 1053 10/15/16 BY L BENFIELD    Culture GRAM POSITIVE COCCI  Final   Report Status PENDING  Incomplete  Blood Culture ID Panel (Reflexed)     Status: Abnormal   Collection Time: 10/14/16  2:13 PM  Result Value Ref Range Status   Enterococcus species NOT DETECTED NOT DETECTED Final   Listeria monocytogenes NOT DETECTED NOT DETECTED Final   Staphylococcus species DETECTED (A) NOT DETECTED Final  Comment: Methicillin (oxacillin) susceptible coagulase negative staphylococcus. Possible blood culture contaminant (unless isolated from more than one blood culture draw or clinical case suggests pathogenicity). No antibiotic treatment is indicated for blood  culture contaminants. CRITICAL RESULT CALLED TO, READ BACK BY AND VERIFIED WITH: A JOHNSTON,PHARMD AT 1053 10/15/16 BY L BENFIELD    Staphylococcus aureus NOT DETECTED NOT DETECTED Final   Methicillin resistance NOT DETECTED NOT DETECTED Final   Streptococcus species NOT DETECTED NOT DETECTED Final   Streptococcus agalactiae NOT DETECTED NOT DETECTED Final   Streptococcus pneumoniae NOT DETECTED NOT DETECTED Final   Streptococcus pyogenes NOT DETECTED NOT DETECTED Final   Acinetobacter baumannii NOT DETECTED NOT DETECTED Final   Enterobacteriaceae species NOT DETECTED NOT DETECTED Final   Enterobacter cloacae complex NOT DETECTED NOT DETECTED Final   Escherichia coli NOT DETECTED NOT DETECTED Final   Klebsiella oxytoca NOT DETECTED NOT DETECTED Final   Klebsiella pneumoniae NOT DETECTED NOT DETECTED Final   Proteus species NOT DETECTED NOT DETECTED Final   Serratia marcescens NOT DETECTED NOT DETECTED Final   Haemophilus influenzae NOT DETECTED NOT DETECTED Final   Neisseria meningitidis NOT DETECTED NOT DETECTED Final   Pseudomonas aeruginosa NOT DETECTED NOT DETECTED Final   Candida albicans NOT DETECTED NOT DETECTED Final   Candida glabrata NOT DETECTED NOT DETECTED Final   Candida krusei NOT DETECTED NOT  DETECTED Final   Candida parapsilosis NOT DETECTED NOT DETECTED Final   Candida tropicalis NOT DETECTED NOT DETECTED Final         Radiology Studies: Ct Angio Head W Or Wo Contrast  Result Date: 10/15/2016 CLINICAL DATA:  83 year old male with confusion, altered mental status. Age indeterminate Right PICA territory infarct on head CT yesterday. EXAM: CT ANGIOGRAPHY HEAD AND NECK TECHNIQUE: Multidetector CT imaging of the head and neck was performed using the standard protocol during bolus administration of intravenous contrast. Multiplanar CT image reconstructions and MIPs were obtained to evaluate the vascular anatomy. Carotid stenosis measurements (when applicable) are obtained utilizing NASCET criteria, using the distal internal carotid diameter as the denominator. CONTRAST:  50 mL Isovue 370 COMPARISON:  Head CT without contrast 10/14/2016. Cervical spine MRI 11/06/2011. 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 Posterior fusion hardware at C7-T1 with evidence of posterior element ankylosis at that level. Bulky anterior endplate osteophytosis without ankylosis at C2-C3. Bulky partially calcified ligamentous hypertrophy about the odontoid. No acute osseous abnormality identified. Upper chest: Layering bilateral pleural effusions, small to moderate and greater on the left. Mild compressive atelectasis greater on the left. No superior mediastinal lymphadenopathy. Other neck: Coarsely calcified benign appearing left thyroid nodule. Bulky 14 mm right submandibular gland sialolith. Otherwise negative. Aortic arch: Pulmonary artery dominant contrast bolus timing. Visible central pulmonary arteries are patent. Mild aortic arch calcified atherosclerosis. No great vessel origin stenosis. Right carotid system: Mild right CCA plaque without stenosis. Minimal right carotid bifurcation plaque. Negative cervical right ICA. Left carotid  system: Mild left CCA plaque without stenosis. Mild soft and calcified plaque at the left carotid bifurcation including the ICA origin and bulb without stenosis. Otherwise negative cervical left ICA. Vertebral arteries: No proximal right subclavian artery stenosis. Calcified plaque at the right vertebral artery origin with mild to moderate stenosis. Obscured proximal right V2 segment due to hardware streak artifact. The visible right V 2 segment is patent but non dominant. No definite additional right vertebral artery stenosis to the skullbase. No proximal left subclavian artery stenosis despite soft  and calcified plaque. Bulky calcified plaque at the left vertebral artery origin with moderate to severe stenosis (series 9, image 175). Dominant left vertebral artery. The proximal left V2 segment is obscured similar to that on the right. The from the visible left V2 segment the left vertebral artery is patent to the skullbase without stenosis. CTA HEAD Posterior circulation: Moderate to severe stenosis of the distal right vertebral artery as it crosses the dura is suspected (series 9, image 144), but the right V4 segment is patent. Bulky right V4 calcified plaque with severe stenosis just proximal to the vertebrobasilar junction (series 9, image 129). Bulky distal left vertebral artery calcified plaque also with moderate to severe stenosis proximal to the vertebrobasilar junction. The distal left vertebral artery is mildly dominant. Neither PICA is definitely identified. Patent vertebrobasilar junction and basilar artery. Basilar artery irregularity without stenosis. Irregular but patent bilateral SCA and PCA origins. Mild to moderate bilateral PCA origins stenosis. Bilateral PCA branches are within normal limits. Anterior circulation: Both ICA siphons are patent, but there is severe siphon calcified plaque worse on the right where severe distal cavernous and proximal supraclinoid stenosis occurs (series 8 image 114.  Moderate to severe left ICA siphon stenosis in the same segments. Despite this both carotid termini are patent. Left ACA A1 segment appears dominant and is patent. Anterior communicating artery is patent. However, the right ACA A2 segment is occluded just beyond its origin. The left ACA remains patent. Left MCA M1 segment and bifurcation are patent. No left MCA branch occlusion identified. No M1 stenosis. Right MCA M1 segment and bifurcation are patent. No definite right MCA branch occlusion although the anterior right MCA division is attenuated. Venous sinuses: Patent on the delayed images. Anatomic variants: Dominant left vertebral artery. Delayed phase: Stable appearance of hypodensity in the inferior right cerebellum since the CT yesterday. Patchy and confluent bilateral cerebral white matter hypodensity. Heterogeneity in the basal ganglia worse on the right. No new cortically based infarct identified. No midline shift, mass effect, or evidence of intracranial mass lesion. No acute intracranial hemorrhage identified. No ventriculomegaly. No abnormal enhancement identified. Review of the MIP images confirms the above findings IMPRESSION: 1. No emergent large vessel occlusion. 2. Age indeterminate but probably chronic (See #7) right ACA occlusion beginning at the A2 segment. 3. Mild extracranial but severe carotid siphon atherosclerosis and stenosis. Bulky calcified plaque results in severe right and moderate to severe left ICA stenosis at the distal cavernous and/or supraclinoid segments. Both carotid termini remain patent. 4. Anterior division right MCA branches appear attenuated, but no discrete branch occlusion or proximal MCA stenosis is identified. 5. Severe posterior circulation atherosclerosis with severe distal right vertebral artery stenosis in the posterior fossa, and moderate to severe stenosis of the dominant left vertebral artery at both its origin and distal segment. 6. Up to moderate stenosis at  both PCA origins. 7. Stable CT appearance of the brain since yesterday. Unchanged appearance of the right PICA territory infarct which is age indeterminate. And no other acute cortically based infarct has developed. 8. Small to moderate left greater than right layering pleural effusions. 9. Widespread chronic cervical spine ankylosis and postoperative arthrodesis at the cervicothoracic junction. Electronically Signed   By: Genevie Ann M.D.   On: 10/15/2016 11:37   Ct Head Wo Contrast  Result Date: 10/14/2016 CLINICAL DATA:  Altered mental status EXAM: CT HEAD WITHOUT CONTRAST TECHNIQUE: Contiguous axial images were obtained from the base of the skull through the vertex without intravenous  contrast. COMPARISON:  October 18, 2011 FINDINGS: Brain: There is moderate diffuse atrophy. There is a focal area of decreased attenuation in the inferior, posterior aspect of the right cerebellum, likely a recent infarct involving a portion of the right posterior inferior cerebral artery distribution. No other evidence of recent/acute infarct. There is patchy small vessel disease throughout the centra semiovale bilaterally. There is evidence of prior infarct involving a portion of the head of the caudate nucleus on the right and anterior limb of the right internal capsule. There is no well-defined mass, hemorrhage, extra-axial fluid collection, or midline shift. Vascular: There is no appreciable hyperdense vessel. There is calcification in each carotid siphon region. There is also calcification in each distal vertebral artery as well as in the right carotid artery in the petrous region. Skull: Bones are osteoporotic.  Bony calvarium appears intact. Sinuses/Orbits: There is opacification of multiple ethmoid air cells the right. There is mucosal thickening throughout multiple ethmoid air cells as well. There is mild mucosal thickening inferior left maxillary antrum anteriorly. There is mucosal thickening in a portion of the left  sphenoid sinus. Orbits appear symmetric bilaterally. Other: Mastoid air cells are clear. IMPRESSION: Findings felt to represent an acute/recent infarct in the distribution of a portion of the right posterior inferior cerebellar artery. There is atrophy with extensive supratentorial small vessel disease. Prior infarct noted involving the head of the caudate nucleus on the right as well as a portion of the anterior limb of the right internal capsule. No hemorrhage.  No extra-axial fluid. Extensive arteriovascular calcification. Foci of paranasal sinus disease, most pronounced in the right ethmoid air cell complex. Electronically Signed   By: Lowella Grip III M.D.   On: 10/14/2016 14:48   Ct Angio Neck W Or Wo Contrast  Result Date: 10/15/2016 CLINICAL DATA:  83 year old male with confusion, altered mental status. Age indeterminate Right PICA territory infarct on head CT yesterday. EXAM: CT ANGIOGRAPHY HEAD AND NECK TECHNIQUE: Multidetector CT imaging of the head and neck was performed using the standard protocol during bolus administration of intravenous contrast. Multiplanar CT image reconstructions and MIPs were obtained to evaluate the vascular anatomy. Carotid stenosis measurements (when applicable) are obtained utilizing NASCET criteria, using the distal internal carotid diameter as the denominator. CONTRAST:  50 mL Isovue 370 COMPARISON:  Head CT without contrast 10/14/2016. Cervical spine MRI 11/06/2011. 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 Posterior fusion hardware at C7-T1 with evidence of posterior element ankylosis at that level. Bulky anterior endplate osteophytosis without ankylosis at C2-C3. Bulky partially calcified ligamentous hypertrophy about the odontoid. No acute osseous abnormality identified. Upper chest: Layering bilateral pleural effusions, small to moderate and greater on the left. Mild compressive  atelectasis greater on the left. No superior mediastinal lymphadenopathy. Other neck: Coarsely calcified benign appearing left thyroid nodule. Bulky 14 mm right submandibular gland sialolith. Otherwise negative. Aortic arch: Pulmonary artery dominant contrast bolus timing. Visible central pulmonary arteries are patent. Mild aortic arch calcified atherosclerosis. No great vessel origin stenosis. Right carotid system: Mild right CCA plaque without stenosis. Minimal right carotid bifurcation plaque. Negative cervical right ICA. Left carotid system: Mild left CCA plaque without stenosis. Mild soft and calcified plaque at the left carotid bifurcation including the ICA origin and bulb without stenosis. Otherwise negative cervical left ICA. Vertebral arteries: No proximal right subclavian artery stenosis. Calcified plaque at the right vertebral artery origin with mild to moderate stenosis. Obscured proximal right V2 segment  due to hardware streak artifact. The visible right V 2 segment is patent but non dominant. No definite additional right vertebral artery stenosis to the skullbase. No proximal left subclavian artery stenosis despite soft and calcified plaque. Bulky calcified plaque at the left vertebral artery origin with moderate to severe stenosis (series 9, image 175). Dominant left vertebral artery. The proximal left V2 segment is obscured similar to that on the right. The from the visible left V2 segment the left vertebral artery is patent to the skullbase without stenosis. CTA HEAD Posterior circulation: Moderate to severe stenosis of the distal right vertebral artery as it crosses the dura is suspected (series 9, image 144), but the right V4 segment is patent. Bulky right V4 calcified plaque with severe stenosis just proximal to the vertebrobasilar junction (series 9, image 129). Bulky distal left vertebral artery calcified plaque also with moderate to severe stenosis proximal to the vertebrobasilar junction. The  distal left vertebral artery is mildly dominant. Neither PICA is definitely identified. Patent vertebrobasilar junction and basilar artery. Basilar artery irregularity without stenosis. Irregular but patent bilateral SCA and PCA origins. Mild to moderate bilateral PCA origins stenosis. Bilateral PCA branches are within normal limits. Anterior circulation: Both ICA siphons are patent, but there is severe siphon calcified plaque worse on the right where severe distal cavernous and proximal supraclinoid stenosis occurs (series 8 image 114. Moderate to severe left ICA siphon stenosis in the same segments. Despite this both carotid termini are patent. Left ACA A1 segment appears dominant and is patent. Anterior communicating artery is patent. However, the right ACA A2 segment is occluded just beyond its origin. The left ACA remains patent. Left MCA M1 segment and bifurcation are patent. No left MCA branch occlusion identified. No M1 stenosis. Right MCA M1 segment and bifurcation are patent. No definite right MCA branch occlusion although the anterior right MCA division is attenuated. Venous sinuses: Patent on the delayed images. Anatomic variants: Dominant left vertebral artery. Delayed phase: Stable appearance of hypodensity in the inferior right cerebellum since the CT yesterday. Patchy and confluent bilateral cerebral white matter hypodensity. Heterogeneity in the basal ganglia worse on the right. No new cortically based infarct identified. No midline shift, mass effect, or evidence of intracranial mass lesion. No acute intracranial hemorrhage identified. No ventriculomegaly. No abnormal enhancement identified. Review of the MIP images confirms the above findings IMPRESSION: 1. No emergent large vessel occlusion. 2. Age indeterminate but probably chronic (See #7) right ACA occlusion beginning at the A2 segment. 3. Mild extracranial but severe carotid siphon atherosclerosis and stenosis. Bulky calcified plaque results  in severe right and moderate to severe left ICA stenosis at the distal cavernous and/or supraclinoid segments. Both carotid termini remain patent. 4. Anterior division right MCA branches appear attenuated, but no discrete branch occlusion or proximal MCA stenosis is identified. 5. Severe posterior circulation atherosclerosis with severe distal right vertebral artery stenosis in the posterior fossa, and moderate to severe stenosis of the dominant left vertebral artery at both its origin and distal segment. 6. Up to moderate stenosis at both PCA origins. 7. Stable CT appearance of the brain since yesterday. Unchanged appearance of the right PICA territory infarct which is age indeterminate. And no other acute cortically based infarct has developed. 8. Small to moderate left greater than right layering pleural effusions. 9. Widespread chronic cervical spine ankylosis and postoperative arthrodesis at the cervicothoracic junction. Electronically Signed   By: Genevie Ann M.D.   On: 10/15/2016 11:37   Dg Chest  Portable 1 View  Result Date: 10/14/2016 CLINICAL DATA:  Acute stroke with shortness of breath. EXAM: PORTABLE CHEST 1 VIEW COMPARISON:  11/15/2015 FINDINGS: 1543 hours. The cardio pericardial silhouette is enlarged. Insert basilar atelectasis. No airspace pulmonary edema or focal lung consolidation. No substantial pleural effusion. Left permanent pacemaker again noted. Degenerative changes evident and right shoulder. Telemetry leads overlie the chest. IMPRESSION: Cardiomegaly with basilar atelectasis. Electronically Signed   By: Misty Stanley M.D.   On: 10/14/2016 15:48   Scheduled Meds: . doxycycline  100 mg Oral Q12H  . enoxaparin (LOVENOX) injection  30 mg Subcutaneous Q24H  . insulin aspart  0-9 Units Subcutaneous TID WC  . insulin aspart  3 Units Subcutaneous TID WC   Continuous Infusions: . sodium chloride 75 mL/hr at 10/15/16 1627     LOS: 2 days    Time spent: [place], MD [facility] (P) [phone]   If 7PM-7AM, please contact night-coverage [email or web address] Password [email or web address] 10/16/2016, 11:48 AM

## 2016-10-17 DIAGNOSIS — G9340 Encephalopathy, unspecified: Secondary | ICD-10-CM

## 2016-10-17 DIAGNOSIS — E1165 Type 2 diabetes mellitus with hyperglycemia: Secondary | ICD-10-CM

## 2016-10-17 DIAGNOSIS — Z794 Long term (current) use of insulin: Secondary | ICD-10-CM

## 2016-10-17 DIAGNOSIS — E1149 Type 2 diabetes mellitus with other diabetic neurological complication: Secondary | ICD-10-CM

## 2016-10-17 LAB — GLUCOSE, CAPILLARY
GLUCOSE-CAPILLARY: 239 mg/dL — AB (ref 65–99)
GLUCOSE-CAPILLARY: 263 mg/dL — AB (ref 65–99)
GLUCOSE-CAPILLARY: 178 mg/dL — AB (ref 65–99)

## 2016-10-17 MED ORDER — INSULIN ASPART 100 UNIT/ML ~~LOC~~ SOLN
0.0000 [IU] | Freq: Three times a day (TID) | SUBCUTANEOUS | Status: DC
  Administered 2016-10-18: 3 [IU] via SUBCUTANEOUS

## 2016-10-17 MED ORDER — INSULIN ASPART 100 UNIT/ML ~~LOC~~ SOLN
0.0000 [IU] | Freq: Every day | SUBCUTANEOUS | Status: DC
Start: 2016-10-17 — End: 2016-10-18

## 2016-10-17 NOTE — Progress Notes (Signed)
Rehab Admissions Coordinator Note:  Patient was screened by Retta Diones for appropriateness for an Inpatient Acute Rehab Consult.  At this time, we are recommending Inpatient Rehab consult.  Retta Diones 10/17/2016, 5:10 PM  I can be reached at [phone].

## 2016-10-17 NOTE — Progress Notes (Signed)
Neurology Progress Note  Subjective: No major 24-hour events have been reported. The patient states has no complaints on a 10 point review of systems.   Medications reviewed and reconciled.   Current Meds:   Current Facility-Administered Medications:  .  0.9 %  sodium chloride infusion, , Intravenous, Continuous, Samtani, Jai-Gurmukh, MD, Last Rate: 75 mL/hr at 10/15/16 1627 .  acetaminophen (TYLENOL) tablet 650 mg, 650 mg, Oral, Q4H PRN **OR** acetaminophen (TYLENOL) solution 650 mg, 650 mg, Per Tube, Q4H PRN **OR** acetaminophen (TYLENOL) suppository 650 mg, 650 mg, Rectal, Q4H PRN, Samtani, Jai-Gurmukh, MD .  doxycycline (VIBRA-TABS) tablet 100 mg, 100 mg, Oral, Q12H, Samtani, Jai-Gurmukh, MD, 100 mg at 10/17/16 0939 .  enoxaparin (LOVENOX) injection 30 mg, 30 mg, Subcutaneous, Q24H, Samtani, Jai-Gurmukh, MD, 30 mg at 10/16/16 2202 .  insulin aspart (novoLOG) injection 0-9 Units, 0-9 Units, Subcutaneous, TID WC, Nita Sells, MD, 7 Units at 10/16/16 1801 .  insulin aspart (novoLOG) injection 3 Units, 3 Units, Subcutaneous, TID WC, Nita Sells, MD, 3 Units at 10/16/16 1801 .  insulin glargine (LANTUS) injection 20 Units, 20 Units, Subcutaneous, Daily, Nita Sells, MD, 20 Units at 10/17/16 0939 .  metFORMIN (GLUCOPHAGE-XR) 24 hr tablet 500 mg, 500 mg, Oral, Q breakfast, Verlon Au, Jai-Gurmukh, MD, 500 mg at 10/17/16 0830  Objective:  Temp:  [97.5 F (36.4 C)-97.9 F (36.6 C)] 97.7 F (36.5 C) (06/10 1000) Pulse Rate:  [70-83] 70 (06/10 1000) Resp:  [16-20] 16 (06/10 1000) BP: (109-133)/(66-79) 109/66 (06/10 1000) SpO2:  [93 %-100 %] 98 % (06/10 1000) Weight:  [75 kg (165 lb 5.5 oz)] 75 kg (165 lb 5.5 oz) (06/09 1925)  General: WD thin African-American man sitting up in chair in NAD. He is sleepy this morning. He will briefly arouse and answer some simple questions but goes right back to sleep again. This limits the examination.  HEENT: Neck is supple without  lymphadenopathy. Mucous membranes are moist and the oropharynx is clear. Sclerae are anicteric. There is no conjunctival injection.  CV: Regular, no murmur. Carotid pulses are 2+ and symmetric with no bruits. Distal pulses 2+ and symmetric.  Lungs: CTAB  Extremities: No C/C/E. Neuro: MS: As noted above.  CN: Pupils are equal and reactive from 3-->2 mm bilaterally. Eyes are conjugate. Corneals are intact bilaterally. Face is symmetric at rest with normal strength. Motor: Normal bulk, tone throughout. No tremor or other abnormal movements are observed.  Sensation: Deferred. DTRs: 2+, symmetric. Toes are downgoing bilaterally. No pathological reflexes.  Coordination: Deferred.   Labs: Lab Results  Component Value Date   WBC 8.8 10/14/2016   HGB 16.7 10/14/2016   HCT 49.0 10/14/2016   PLT 182 10/14/2016   GLUCOSE 178 (H) 10/14/2016   CHOL 125 10/15/2016   TRIG 69 10/15/2016   HDL 44 10/15/2016   LDLDIRECT 79.0 12/24/2013   LDLCALC 67 10/15/2016   ALT 16 (L) 10/14/2016   AST 25 10/14/2016   NA 141 10/14/2016   K 4.5 10/14/2016   CL 106 10/14/2016   CREATININE 1.10 10/14/2016   BUN 20 10/14/2016   CO2 20 (L) 10/14/2016   TSH 2.380 11/13/2015   INR 1.07 11/18/2011   HGBA1C 7.5 (H) 10/15/2016   MICROALBUR 14.6 (H) 09/01/2016   CBC Latest Ref Rng & Units 10/14/2016 10/14/2016 09/01/2016  WBC 4.0 - 10.5 K/uL - 8.8 5.0  Hemoglobin 13.0 - 17.0 g/dL 16.7 14.3 13.1  Hematocrit 39.0 - 52.0 % 49.0 42.4 39.7  Platelets 150 - 400 K/uL -  182 173.0    Lab Results  Component Value Date   HGBA1C 7.5 (H) 10/15/2016   Lab Results  Component Value Date   ALT 16 (L) 10/14/2016   AST 25 10/14/2016   ALKPHOS 110 10/14/2016   BILITOT 1.5 (H) 10/14/2016   Blood cultures from 10/14/16 growing gram-positive cocci in clusters and one bottle, likely contaminant  Radiology:  No new neuroimaging.  A/P:   1. Acute encephalopathy: No obvious etiologies for increased confusion has been identified.  This may simply be reflective of his underlying dementia and its progression. Bouts of confusion can occur in those with advanced dementia even without clear provocation, and sometimes a provocation is so minor as to be difficult to identify. Continue to optimize metabolic status as you are. Continue to limit CNS active medications, particularly benzodiazepines, opiates, and anything with strong anticholinergic properties. Normalize sleep-wake cycles, keeping his room bright and active during the day and quiet and dark during the night.  2. Cerebrovascular disease: He has significant chronic small vessel ischemic disease but no evidence of acute stroke. Continue risk factor modification. Recommend addition of aspirin. Lipids are currently at goal so would defer statin.  3. Dementia: He has a known history of dementia. This is severe. His dementia places him at risk for encephalopathy and delirium from all causes. It would also predict delayed recovery from any such encephalopathy as well. Supportive care at this time. Given his advanced dementia, usual pharmacologic interventions are less likely to be beneficial. This can be evaluated further by his outpatient neurologist. The patient should no longer be allowed to drive given his dementia. I have concerns is about his ability to take care of himself and he is likely to require constant supervision.   No family is present at the bedside.  No additional recommendations at this time, will sign off. Please call if any new issues should arise.   Melba Coon, MD [facility]

## 2016-10-17 NOTE — Progress Notes (Signed)
Patient's niece in the room visiting. Asking for health information regarding patient. Referred niece to patient's daughter who is listed as primary contact.  Niece states this is not possible and that she is the patient's executor of estate.  Explained privacy laws and inability to share information. Niece wanting President of [place] and [facility] President names. Given along with unit director, Mr. D. Love.  Niece demanding a phone call Monday October 18, 2016.  Wants to know discharge plan.  [name]

## 2016-10-17 NOTE — Progress Notes (Addendum)
PROGRESS NOTE   [name]  VZD:[id number]    DOB: Mar 20, 1931    DOA: 10/14/2016  PCP: Elayne Snare, MD   I have briefly reviewed patients previous medical records in [facility].  Brief Narrative:  83 year old male with PMH of prostate cancer, chronic systolic CHF, pacemaker implantation, DM 2, HLD, HTN, syncope, dementia, presented to ED with new onset mental status changes. A deer tick was apparently found on his body in the ED. Initial concern for acute CVA. Neurology was consulted.   Assessment & Plan:   Active Problems:   Stroke (cerebrum) ([place])   1. Acute encephalopathy: Extensively evaluated. Neurology consultation and follow-up appreciated. Discussed with neurology. No obvious etiologies for increased confusion have been identified. As per neurology, this may be simply reflective of his underlying dementia and its progression and bouts of confusion can occur in those with advanced dementia even without clear provocation or with minor provocation which can be difficult to identify. Avoid any medications that might precipitate confusion. Optimize sleep cycle. Control hyperglycemia. We'll repeat BMP. UDS negative. Blood alcohol level on admission negative. One of 2 blood cultures positive for coagulase-negative staph, likely contaminant. RMSF IgG & IgM: Negative. Chest x-ray without pneumonia. 2. Cerebrovascular disease: As per neurology, he has significant chronic small vessel ischemic disease but no evidence of acute stroke. Continue risk factor modification >lipids are currently at goal so would not add statins. Start aspirin as per neurology recommendation. 3. Dementia: As per neurology, severe. This can be evaluated further by outpatient neurology. Also he should no longer be allowed to drive given his dementia. Also concerns about his ability to care for himself and is likely to require constant supervision. 4. Chronic systolic CHF/cardiomyopathy/PPM: 2-D echo 10/15/16: LVEF 30-35  percent. Euvolemic/compensated. He is maybe slightly worse than July 2017. Outpatient follow-up as needed. 5. Type II DM: A1c 7.5. Uncontrolled. Change SSI to moderate with bedtime scale. Also on metformin. 6. Stage III chronic kidney disease: Creatinine 1.3 on admission. Follow repeat BMP. 7. Essential hypertension: Currently controlled off of antihypertensives. 8. Prostate cancer history: Status post surgery and radiation. 9. Tick exposure: Not sure if bite. RMSF serology negative. On oral doxycycline.   DVT prophylaxis: Lovenox Code Status: Full Family Communication: None at bedside Disposition: Consult CIR as per PT recommendations.   Consultants:  Neurology Rehabilitation M.D.   Procedures:  None  Antimicrobials:  Doxycycline   Subjective: Seen this morning. Oriented to self only. Pleasantly confused. Denies complaints.   ROS: No pain reported.  Objective:  Vitals:   10/17/16 0100 10/17/16 0451 10/17/16 1000 10/17/16 1452  BP: 133/79 120/75 109/66 131/76  Pulse: 74 73 70 81  Resp: 18 18 16 20   Temp: 97.7 F (36.5 C) 97.5 F (36.4 C) 97.7 F (36.5 C) 98.6 F (37 C)  TempSrc: Oral Oral Oral   SpO2: 100% 98% 98% 97%  Weight:      Height:        Examination:  General exam: Pleasant elderly male sitting up comfortably in chair and had just finished eating his breakfast. Noted leaning to the left. Respiratory system: Clear to auscultation. Respiratory effort normal. Cardiovascular system: S1 & S2 heard, RRR. No JVD, murmurs, rubs, gallops or clicks. No pedal edema. Gastrointestinal system: Abdomen is nondistended, soft and nontender. No organomegaly or masses felt. Normal bowel sounds heard. Central nervous system: Alert and oriented only to self and thinks that he is at home. No focal neurological deficits. Extremities: Symmetric 5 x 5  power. Skin: No rashes, lesions or ulcers Psychiatry: Judgement and insight impaired. Mood & affect pleasantly confused.      Data Reviewed: I have personally reviewed following labs and imaging studies  CBC:  Recent Labs Lab 10/14/16 1351 10/14/16 1444  WBC 8.8  --   NEUTROABS 7.5  --   HGB 14.3 16.7  HCT 42.4 49.0  MCV 94.0  --   PLT 182  --    Basic Metabolic Panel:  Recent Labs Lab 10/14/16 1351 10/14/16 1444  NA 139 141  K 4.6 4.5  CL 103 106  CO2 20*  --   GLUCOSE 176* 178*  BUN 18 20  CREATININE 1.32* 1.10  CALCIUM 9.4  --    Liver Function Tests:  Recent Labs Lab 10/14/16 1351  AST 25  ALT 16*  ALKPHOS 110  BILITOT 1.5*  PROT 6.9  ALBUMIN 3.8    HbA1C:  Recent Labs  10/15/16 0438  HGBA1C 7.5*   CBG:  Recent Labs Lab 10/16/16 1131 10/16/16 1634 10/16/16 2138 10/17/16 1112 10/17/16 1633  GLUCAP 341* 333* 92 239* 263*    Recent Results (from the past 240 hour(s))  Culture, blood (routine x 2)     Status: None (Preliminary result)   Collection Time: 10/14/16  2:00 PM  Result Value Ref Range Status   Specimen Description BLOOD LEFT HAND  Final   Special Requests IN PEDIATRIC BOTTLE Blood Culture adequate volume  Final   Culture NO GROWTH 3 DAYS  Final   Report Status PENDING  Incomplete  Culture, blood (routine x 2)     Status: Abnormal   Collection Time: 10/14/16  2:13 PM  Result Value Ref Range Status   Specimen Description BLOOD LEFT WRIST  Final   Special Requests IN PEDIATRIC BOTTLE Blood Culture adequate volume  Final   Culture  Setup Time   Final    GRAM POSITIVE COCCI IN CLUSTERS IN PEDIATRIC BOTTLE CRITICAL RESULT CALLED TO, READ BACK BY AND VERIFIED WITH: A JOHNSTON,PHARMD AT 7209 10/15/16 BY L BENFIELD    Culture (A)  Final    STAPHYLOCOCCUS SPECIES (COAGULASE NEGATIVE) THE SIGNIFICANCE OF ISOLATING THIS ORGANISM FROM A SINGLE SET OF BLOOD CULTURES WHEN MULTIPLE SETS ARE DRAWN IS UNCERTAIN. PLEASE NOTIFY THE MICROBIOLOGY DEPARTMENT WITHIN ONE WEEK IF SPECIATION AND SENSITIVITIES ARE REQUIRED.    Report Status 10/16/2016 FINAL  Final   Blood Culture ID Panel (Reflexed)     Status: Abnormal   Collection Time: 10/14/16  2:13 PM  Result Value Ref Range Status   Enterococcus species NOT DETECTED NOT DETECTED Final   Listeria monocytogenes NOT DETECTED NOT DETECTED Final   Staphylococcus species DETECTED (A) NOT DETECTED Final    Comment: Methicillin (oxacillin) susceptible coagulase negative staphylococcus. Possible blood culture contaminant (unless isolated from more than one blood culture draw or clinical case suggests pathogenicity). No antibiotic treatment is indicated for blood  culture contaminants. CRITICAL RESULT CALLED TO, READ BACK BY AND VERIFIED WITH: A JOHNSTON,PHARMD AT 4709 10/15/16 BY L BENFIELD    Staphylococcus aureus NOT DETECTED NOT DETECTED Final   Methicillin resistance NOT DETECTED NOT DETECTED Final   Streptococcus species NOT DETECTED NOT DETECTED Final   Streptococcus agalactiae NOT DETECTED NOT DETECTED Final   Streptococcus pneumoniae NOT DETECTED NOT DETECTED Final   Streptococcus pyogenes NOT DETECTED NOT DETECTED Final   Acinetobacter baumannii NOT DETECTED NOT DETECTED Final   Enterobacteriaceae species NOT DETECTED NOT DETECTED Final   Enterobacter cloacae complex NOT DETECTED NOT  DETECTED Final   Escherichia coli NOT DETECTED NOT DETECTED Final   Klebsiella oxytoca NOT DETECTED NOT DETECTED Final   Klebsiella pneumoniae NOT DETECTED NOT DETECTED Final   Proteus species NOT DETECTED NOT DETECTED Final   Serratia marcescens NOT DETECTED NOT DETECTED Final   Haemophilus influenzae NOT DETECTED NOT DETECTED Final   Neisseria meningitidis NOT DETECTED NOT DETECTED Final   Pseudomonas aeruginosa NOT DETECTED NOT DETECTED Final   Candida albicans NOT DETECTED NOT DETECTED Final   Candida glabrata NOT DETECTED NOT DETECTED Final   Candida krusei NOT DETECTED NOT DETECTED Final   Candida parapsilosis NOT DETECTED NOT DETECTED Final   Candida tropicalis NOT DETECTED NOT DETECTED Final          Radiology Studies: No results found.      Scheduled Meds: . doxycycline  100 mg Oral Q12H  . enoxaparin (LOVENOX) injection  30 mg Subcutaneous Q24H  . insulin aspart  0-9 Units Subcutaneous TID WC  . insulin aspart  3 Units Subcutaneous TID WC  . insulin glargine  20 Units Subcutaneous Daily  . metFORMIN  500 mg Oral Q breakfast   Continuous Infusions: . sodium chloride 75 mL/hr at 10/15/16 1627     LOS: 3 days     Shannah Conteh, MD, FACP, FHM. [facility] Pager [phone]  If 7PM-7AM, please contact night-coverage [email or web address] Password [facility] 10/17/2016, 5:28 PM

## 2016-10-18 ENCOUNTER — Other Ambulatory Visit: Payer: Self-pay

## 2016-10-18 ENCOUNTER — Encounter (HOSPITAL_COMMUNITY): Payer: Self-pay | Admitting: Nurse Practitioner

## 2016-10-18 ENCOUNTER — Inpatient Hospital Stay (HOSPITAL_COMMUNITY)
Admission: RE | Admit: 2016-10-18 | Discharge: 2016-11-04 | DRG: 056 | Disposition: A | Discharge status: Hospice | Payer: Medicare Other | Source: Intra-hospital | Attending: Physical Medicine & Rehabilitation | Admitting: Physical Medicine & Rehabilitation

## 2016-10-18 DIAGNOSIS — N183 Chronic kidney disease, stage 3 unspecified: Secondary | ICD-10-CM

## 2016-10-18 DIAGNOSIS — G309 Alzheimer's disease, unspecified: Secondary | ICD-10-CM | POA: Diagnosis not present

## 2016-10-18 DIAGNOSIS — R262 Difficulty in walking, not elsewhere classified: Secondary | ICD-10-CM | POA: Diagnosis present

## 2016-10-18 DIAGNOSIS — R5381 Other malaise: Secondary | ICD-10-CM | POA: Diagnosis present

## 2016-10-18 DIAGNOSIS — E871 Hypo-osmolality and hyponatremia: Secondary | ICD-10-CM | POA: Diagnosis present

## 2016-10-18 DIAGNOSIS — I509 Heart failure, unspecified: Secondary | ICD-10-CM | POA: Diagnosis not present

## 2016-10-18 DIAGNOSIS — R627 Adult failure to thrive: Secondary | ICD-10-CM | POA: Diagnosis present

## 2016-10-18 DIAGNOSIS — I5022 Chronic systolic (congestive) heart failure: Secondary | ICD-10-CM | POA: Diagnosis present

## 2016-10-18 DIAGNOSIS — E1122 Type 2 diabetes mellitus with diabetic chronic kidney disease: Secondary | ICD-10-CM | POA: Diagnosis present

## 2016-10-18 DIAGNOSIS — F0150 Vascular dementia without behavioral disturbance: Secondary | ICD-10-CM | POA: Diagnosis not present

## 2016-10-18 DIAGNOSIS — I10 Essential (primary) hypertension: Secondary | ICD-10-CM

## 2016-10-18 DIAGNOSIS — N189 Chronic kidney disease, unspecified: Secondary | ICD-10-CM | POA: Diagnosis not present

## 2016-10-18 DIAGNOSIS — Z950 Presence of cardiac pacemaker: Secondary | ICD-10-CM

## 2016-10-18 DIAGNOSIS — R4182 Altered mental status, unspecified: Secondary | ICD-10-CM | POA: Diagnosis not present

## 2016-10-18 DIAGNOSIS — R630 Anorexia: Secondary | ICD-10-CM | POA: Diagnosis present

## 2016-10-18 DIAGNOSIS — I69398 Other sequelae of cerebral infarction: Principal | ICD-10-CM

## 2016-10-18 DIAGNOSIS — Z79899 Other long term (current) drug therapy: Secondary | ICD-10-CM | POA: Diagnosis not present

## 2016-10-18 DIAGNOSIS — E870 Hyperosmolality and hypernatremia: Secondary | ICD-10-CM | POA: Diagnosis not present

## 2016-10-18 DIAGNOSIS — Z7189 Other specified counseling: Secondary | ICD-10-CM | POA: Diagnosis not present

## 2016-10-18 DIAGNOSIS — E119 Type 2 diabetes mellitus without complications: Secondary | ICD-10-CM

## 2016-10-18 DIAGNOSIS — Z794 Long term (current) use of insulin: Secondary | ICD-10-CM | POA: Diagnosis not present

## 2016-10-18 DIAGNOSIS — I2510 Atherosclerotic heart disease of native coronary artery without angina pectoris: Secondary | ICD-10-CM

## 2016-10-18 DIAGNOSIS — Z515 Encounter for palliative care: Secondary | ICD-10-CM | POA: Diagnosis not present

## 2016-10-18 DIAGNOSIS — F0280 Dementia in other diseases classified elsewhere without behavioral disturbance: Secondary | ICD-10-CM | POA: Diagnosis not present

## 2016-10-18 DIAGNOSIS — I130 Hypertensive heart and chronic kidney disease with heart failure and stage 1 through stage 4 chronic kidney disease, or unspecified chronic kidney disease: Secondary | ICD-10-CM | POA: Diagnosis present

## 2016-10-18 DIAGNOSIS — G9340 Encephalopathy, unspecified: Secondary | ICD-10-CM | POA: Diagnosis present

## 2016-10-18 DIAGNOSIS — F0390 Unspecified dementia without behavioral disturbance: Secondary | ICD-10-CM | POA: Diagnosis present

## 2016-10-18 DIAGNOSIS — E1159 Type 2 diabetes mellitus with other circulatory complications: Secondary | ICD-10-CM | POA: Diagnosis not present

## 2016-10-18 DIAGNOSIS — W57XXXA Bitten or stung by nonvenomous insect and other nonvenomous arthropods, initial encounter: Secondary | ICD-10-CM

## 2016-10-18 DIAGNOSIS — E1165 Type 2 diabetes mellitus with hyperglycemia: Secondary | ICD-10-CM | POA: Diagnosis not present

## 2016-10-18 DIAGNOSIS — R1312 Dysphagia, oropharyngeal phase: Secondary | ICD-10-CM | POA: Diagnosis not present

## 2016-10-18 DIAGNOSIS — I5042 Chronic combined systolic (congestive) and diastolic (congestive) heart failure: Secondary | ICD-10-CM | POA: Diagnosis present

## 2016-10-18 DIAGNOSIS — J9811 Atelectasis: Secondary | ICD-10-CM | POA: Diagnosis not present

## 2016-10-18 DIAGNOSIS — Z7982 Long term (current) use of aspirin: Secondary | ICD-10-CM | POA: Diagnosis not present

## 2016-10-18 DIAGNOSIS — Z66 Do not resuscitate: Secondary | ICD-10-CM

## 2016-10-18 DIAGNOSIS — G464 Cerebellar stroke syndrome: Secondary | ICD-10-CM | POA: Diagnosis not present

## 2016-10-18 DIAGNOSIS — E1142 Type 2 diabetes mellitus with diabetic polyneuropathy: Secondary | ICD-10-CM | POA: Diagnosis present

## 2016-10-18 DIAGNOSIS — I63441 Cerebral infarction due to embolism of right cerebellar artery: Secondary | ICD-10-CM | POA: Diagnosis not present

## 2016-10-18 DIAGNOSIS — T17308A Unspecified foreign body in larynx causing other injury, initial encounter: Secondary | ICD-10-CM

## 2016-10-18 DIAGNOSIS — S80869A Insect bite (nonvenomous), unspecified lower leg, initial encounter: Secondary | ICD-10-CM

## 2016-10-18 LAB — CREATININE, SERUM
Creatinine, Ser: 1.17 mg/dL (ref 0.61–1.24)
GFR calc Af Amer: >60 mL/min (ref >60)
GFR calc non Af Amer: 56 mL/min — ABNORMAL LOW (ref >60)

## 2016-10-18 LAB — GLUCOSE, CAPILLARY
GLUCOSE-CAPILLARY: 153 mg/dL — AB (ref 65–99)
GLUCOSE-CAPILLARY: 192 mg/dL — AB (ref 65–99)

## 2016-10-18 LAB — BASIC METABOLIC PANEL
Anion gap: 10 (ref 5–15)
BUN: 28 mg/dL — AB (ref 6–20)
CALCIUM: 9.4 mg/dL (ref 8.9–10.3)
CO2: 21 mmol/L — ABNORMAL LOW (ref 22–32)
CREATININE: 1.2 mg/dL (ref 0.61–1.24)
Chloride: 105 mmol/L (ref 101–111)
GFR calc Af Amer: >60 mL/min (ref >60)
GFR calc non Af Amer: 54 mL/min — ABNORMAL LOW (ref >60)
Glucose, Bld: 142 mg/dL — ABNORMAL HIGH (ref 65–99)
Potassium: 4.7 mmol/L (ref 3.5–5.1)
SODIUM: 136 mmol/L (ref 135–145)

## 2016-10-18 LAB — CBC
HCT: 40.5 % (ref 39.0–52.0)
Hemoglobin: 13.8 g/dL (ref 13.0–17.0)
MCH: 31.8 pg (ref 26.0–34.0)
MCHC: 34.1 g/dL (ref 30.0–36.0)
MCV: 93.3 fL (ref 78.0–100.0)
PLATELETS: 183 10*3/uL (ref 150–400)
RBC: 4.34 MIL/uL (ref 4.22–5.81)
RDW: 14.4 % (ref 11.5–15.5)
WBC: 11.5 10*3/uL — ABNORMAL HIGH (ref 4.0–10.5)

## 2016-10-18 MED ORDER — ENOXAPARIN SODIUM 40 MG/0.4ML ~~LOC~~ SOLN: 40.0000 mg | SUBCUTANEOUS | Status: DC

## 2016-10-18 MED ORDER — ASPIRIN EC 81 MG PO TBEC
81.0000 mg | DELAYED_RELEASE_TABLET | Freq: Every day | ORAL | Status: DC
  Administered 2016-10-19 – 2016-11-04 (×16): 81 mg via ORAL
  Filled 2016-10-18 (×16): qty 1

## 2016-10-18 MED ORDER — ASPIRIN 81 MG PO TBEC: 81.0000 mg | DELAYED_RELEASE_TABLET | Freq: Every day | ORAL | Status: AC

## 2016-10-18 MED ORDER — ACETAMINOPHEN 650 MG RE SUPP: 650.0000 mg | RECTAL | Status: DC | PRN

## 2016-10-18 MED ORDER — INSULIN GLARGINE 100 UNIT/ML ~~LOC~~ SOLN: 20.0000 [IU] | Freq: Every day | SUBCUTANEOUS | Status: DC

## 2016-10-18 MED ORDER — ACETAMINOPHEN 160 MG/5ML PO SOLN: 650.0000 mg | ORAL | Status: DC | PRN

## 2016-10-18 MED ORDER — ONDANSETRON HCL 4 MG/2ML IJ SOLN: 4.0000 mg | Freq: Four times a day (QID) | INTRAMUSCULAR | Status: DC | PRN

## 2016-10-18 MED ORDER — SORBITOL 70 % SOLN
30.0000 mL | Freq: Every day | Status: DC | PRN
  Administered 2016-10-21 – 2016-10-30 (×2): 30 mL via ORAL
  Filled 2016-10-18 (×2): qty 30

## 2016-10-18 MED ORDER — METFORMIN HCL ER 500 MG PO TB24: 1000.0000 mg | ORAL_TABLET | Freq: Every day | ORAL | Status: DC

## 2016-10-18 MED ORDER — GLUCOSE 40 % PO GEL
ORAL | Status: AC
  Administered 2016-10-18: 17:00:00
  Filled 2016-10-18: qty 1

## 2016-10-18 MED ORDER — ASPIRIN EC 81 MG PO TBEC
81.0000 mg | DELAYED_RELEASE_TABLET | Freq: Every day | ORAL | Status: DC
  Administered 2016-10-18: 81 mg via ORAL
  Filled 2016-10-18: qty 1

## 2016-10-18 MED ORDER — DOXYCYCLINE HYCLATE 100 MG PO TABS
100.0000 mg | ORAL_TABLET | Freq: Two times a day (BID) | ORAL | Status: AC
  Administered 2016-10-18 – 2016-10-23 (×10): 100 mg via ORAL
  Filled 2016-10-18 (×10): qty 1

## 2016-10-18 MED ORDER — METFORMIN HCL ER 500 MG PO TB24
500.0000 mg | ORAL_TABLET | Freq: Every day | ORAL | Status: DC
  Administered 2016-10-20 – 2016-10-23 (×4): 500 mg via ORAL
  Filled 2016-10-18 (×6): qty 1

## 2016-10-18 MED ORDER — ENOXAPARIN SODIUM 40 MG/0.4ML ~~LOC~~ SOLN
40.0000 mg | SUBCUTANEOUS | Status: DC
  Administered 2016-10-18 – 2016-11-03 (×17): 40 mg via SUBCUTANEOUS
  Filled 2016-10-18 (×19): qty 0.4

## 2016-10-18 MED ORDER — INSULIN ASPART 100 UNIT/ML ~~LOC~~ SOLN
0.0000 [IU] | Freq: Three times a day (TID) | SUBCUTANEOUS | Status: DC
  Administered 2016-10-19: 3 [IU] via SUBCUTANEOUS

## 2016-10-18 MED ORDER — ONDANSETRON HCL 4 MG PO TABS: 4.0000 mg | ORAL_TABLET | Freq: Four times a day (QID) | ORAL | Status: DC | PRN

## 2016-10-18 MED ORDER — INSULIN GLARGINE 100 UNIT/ML ~~LOC~~ SOLN
20.0000 [IU] | Freq: Every day | SUBCUTANEOUS | Status: DC
  Filled 2016-10-18 (×2): qty 0.2

## 2016-10-18 MED ORDER — INSULIN ASPART 100 UNIT/ML ~~LOC~~ SOLN: 3.0000 [IU] | Freq: Three times a day (TID) | SUBCUTANEOUS | Status: DC

## 2016-10-18 MED ORDER — ACETAMINOPHEN 325 MG PO TABS
650.0000 mg | ORAL_TABLET | ORAL | Status: DC | PRN
  Administered 2016-10-18 – 2016-11-02 (×5): 650 mg via ORAL
  Filled 2016-10-18 (×6): qty 2

## 2016-10-18 NOTE — NC FL2 (Signed)
[place] LEVEL OF CARE SCREENING TOOL     IDENTIFICATION  Patient Name: [name] Birthdate: 11/10/1930 Sex: male Admission Date (Current Location): 10/14/2016  [facility] and [place] Number:  Herbalist and Address:  [facility] [place] [address] [place], [place] [address]      Provider Number: [id number]  Attending Physician Name and Address:  Modena Jansky, MD  Relative Name and Phone Number:       Current Level of Care: Hospital Recommended Level of Care: [place] Prior Approval Number:    Date Approved/Denied:   PASRR Number: [id number]  Discharge Plan: SNF    Current Diagnoses: Patient Active Problem List   Diagnosis Date Noted  . Tick bite of calf, initial encounter   . Stage 3 chronic kidney disease   . Diabetes mellitus type 2 in nonobese (HCC)   . Coronary artery disease involving native coronary artery of native heart without angina pectoris   . Dementia without behavioral disturbance   . Stroke (cerebrum) ([place]) 10/14/2016  . Hand pain 06/28/2016  . S/P cardiac pacemaker procedure   . Debilitated 11/18/2015  . Neuropathic pain   . Acute on chronic combined systolic and diastolic heart failure ([place])   . Tachycardia   . Pacemaker   . DM type 2 with diabetic peripheral neuropathy ([place])   . Benign essential HTN   . History of syncope   . LBBB (left bundle branch block)   . Tachypnea   . Acute blood loss anemia   . NSVT (nonsustained ventricular tachycardia) ([place])   . Bradycardia, severe sinus 11/13/2015  . Complete heart block ([place])   . Chronic combined systolic and diastolic heart failure ([place]) 04/27/2013  . Essential hypertension 04/27/2013  . Left bundle branch block 04/27/2013  . Type II or unspecified type diabetes mellitus with neurological manifestations, uncontrolled 01/15/2013    Orientation RESPIRATION BLADDER Height & Weight     Self  Normal Incontinent Weight: 165 lb  5.5 oz (75 kg) Height:  5\' 10"  (177.8 cm)  BEHAVIORAL SYMPTOMS/MOOD NEUROLOGICAL BOWEL NUTRITION STATUS      Incontinent    AMBULATORY STATUS COMMUNICATION OF NEEDS Skin   Extensive Assist Verbally Normal                       Personal Care Assistance Level of Assistance  Bathing, Dressing Bathing Assistance: Maximum assistance   Dressing Assistance: Maximum assistance     Functional Limitations Info             SPECIAL CARE FACTORS FREQUENCY  PT (By licensed PT), OT (By licensed OT)     PT Frequency: 5x/wk OT Frequency: 5x/wk            Contractures      Additional Factors Info  Code Status, Allergies, Insulin Sliding Scale Code Status Info: full Allergies Info: nka   Insulin Sliding Scale Info: 3x/day       Current Medications (10/18/2016):  This is the current hospital active medication list Current Facility-Administered Medications  Medication Dose Route Frequency Provider Last Rate Last Dose  . acetaminophen (TYLENOL) tablet 650 mg  650 mg Oral Q4H PRN Nita Sells, MD   650 mg at 10/17/16 1640   Or  . acetaminophen (TYLENOL) solution 650 mg  650 mg Per Tube Q4H PRN Nita Sells, MD       Or  . acetaminophen (TYLENOL) suppository 650 mg  650  mg Rectal Q4H PRN Nita Sells, MD      . aspirin EC tablet 81 mg  81 mg Oral Daily Modena Jansky, MD   81 mg at 10/18/16 1145  . doxycycline (VIBRA-TABS) tablet 100 mg  100 mg Oral Q12H Nita Sells, MD   100 mg at 10/18/16 0809  . enoxaparin (LOVENOX) injection 40 mg  40 mg Subcutaneous Q24H Skeet Simmer, [facility]      . insulin aspart (novoLOG) injection 0-15 Units  0-15 Units Subcutaneous TID WC Modena Jansky, MD   3 Units at 10/18/16 0809  . insulin aspart (novoLOG) injection 0-5 Units  0-5 Units Subcutaneous QHS Hongalgi, Anand D, MD      . insulin aspart (novoLOG) injection 3 Units  3 Units Subcutaneous TID WC Nita Sells, MD   3 Units at 10/18/16 0809  .  insulin glargine (LANTUS) injection 20 Units  20 Units Subcutaneous Daily Nita Sells, MD   20 Units at 10/18/16 1145  . metFORMIN (GLUCOPHAGE-XR) 24 hr tablet 500 mg  500 mg Oral Q breakfast Nita Sells, MD   500 mg at 10/18/16 0809     Discharge Medications: Please see discharge summary for a list of discharge medications.  Relevant Imaging Results:  Relevant Lab Results:   Additional Information SS#: [id number]  Geralynn Ochs, LCSW

## 2016-10-18 NOTE — Consult Note (Addendum)
           [facility] CM Primary Care Navigator  10/18/2016  [name] 02-13-1931 [id number]   Went to see patient at the bedside to identify possible discharge needs but he was already transferred to Inpatient Rehab (CIR 4W 10) today per staff report.  For questions, please contact:  Dannielle Huh, BSN, RN- [facility] Primary Care Navigator  Telephone: [phone] [place]

## 2016-10-18 NOTE — Care Management Important Message (Signed)
Important Message  Patient Details  Name: [name] MRN: [id number] Date of Birth: 1931-02-04   Medicare Important Message Given:  Yes    Lestine Rahe Montine Circle 10/18/2016, 12:09 PM

## 2016-10-18 NOTE — Clinical Social Work Note (Signed)
Clinical Social Work Assessment  Patient Details  Name: [name] MRN: [id number] Date of Birth: [birth year]  Date of referral:  10/18/16               Reason for consult:  Facility Placement, Discharge Planning                Permission sought to share information with:  Family Supports, Chartered certified accountant granted to share information::  Yes, Verbal Permission Granted  Name::     Heritage manager::  SNF  Relationship::  Daughter  Contact Information:     Housing/Transportation Living arrangements for the past 2 months:  Single Family Home Source of Information:  Adult Children Patient Interpreter Needed:  None Criminal Activity/Legal Involvement Pertinent to Current Situation/Hospitalization:  No - Comment as needed Significant Relationships:  Adult Children Lives with:  Adult Children, Self Do you feel safe going back to the place where you live?  Yes Need for family participation in patient care:  Yes (Comment) (patient confused and disoriented)  Care giving concerns:  Patient resides at home with his daughter, and has been able to independently care for his needs prior to hospital admission. Patient currently requires a higher level of support to complete self care tasks. Patient's daughter works and cannot provide the care and support that the patient will need during the day.   Social Worker assessment / plan:  CSW introduced self to patient's daughter over the phone and discussed concerns with discharge planning. CSW explained recommendation for CIR, and having SNF as a back-up plan just in case. Patient's daughter became tearful and explained that she was very overwhelmed with the patient's current health situation. Patient's daughter discussed how the patient had been very independent prior to admission; he was managing his own needs, including independently managing his medications, hygiene needs, and ADLs. Patient's daughter discussed how she works and  can't provide care for the patient during the day. Patient's daughter cried and explained that the patient is all she has left, and she's not sure what to do in order to care for him. Patient's daughter agreed with placement in CIR as preference, and discussed how he had been there previously. CSW explained to patient's daughter what SNF is, and patient's daughter agreed to seek as a back-up just in case.  Employment status:  Retired Forensic scientist:  Medicare PT Recommendations:  Inpatient [place] / Referral to community resources:  [place]  Patient/Family's Response to care:  Patient's response unable to be assessed due to disorientation. Patient's daughter agreeable to SNF placement as a back-up for CIR.  Patient/Family's Understanding of and Emotional Response to Diagnosis, Current Treatment, and Prognosis:  Patient's emotional response unable to be assessed due to disorientation. Patient's daughter was tearful during discussion and acknowledged feeling overwhelmed and unsure of what to do to best care for the patient. Patient's daughter cried over the phone and expressed sadness that the patient's health had deteriorated so rapidly. Patient's daughter described that she just woke up and the patient was like a totally different person. Patient's daughter seemed to understand the current limitations in her ability to care for the patient, given his current deficits, and hopeful that a rehab stay will improve his functional abilities. Patient indicated understanding of CSW role in discharge planning.  Emotional Assessment Appearance:  Appears stated age Attitude/Demeanor/Rapport:  Unable to Assess Affect (typically observed):  Unable to Assess Orientation:  Oriented to Self Alcohol / Substance use:  Not Applicable Psych involvement (Current and /or in the community):  No (Comment)  Discharge Needs  Concerns to be addressed:  Care Coordination, Discharge  Planning Concerns Readmission within the last 30 days:  No Current discharge risk:  Physical Impairment Barriers to Discharge:  Continued Medical Work up   [employer], [place] 10/18/2016, 2:06 PM

## 2016-10-18 NOTE — Progress Notes (Signed)
Jamse Arn, MD Physician Signed Physical Medicine and Rehabilitation  PMR Pre-admission Date of Service: 10/18/2016 2:06 PM  Related encounter: ED to Hosp-Admission (Discharged) from 10/14/2016 in [place]       [] Hide copied text PMR Admission Coordinator Pre-Admission Assessment  Patient: [name] is an 83 y.o., male MRN: [id number] DOB: 05-Dec-1930 Height: 5\' 10"  (177.8 cm) Weight: 75 kg (165 lb 5.5 oz)                                                                                                                                                  Insurance Information HMO:       PPO:      PCP:      IPA:      80/20:      OTHER:  PRIMARY:  Medicare A and B      Policy#: [id number]      Subscriber:  self CM Name:        Phone#:      Fax#:  Pre-Cert#:        Employer:  retired Benefits:  Phone #:      Name:  Eff. Date:  11/07/97     Deduct:  $1340      Out of Pocket Max:  n/a      Life Max:  n/a CIR:  100%      SNF: 100% Outpatient:  80%/20%     Co-Pay:  Home Health: 100%      Co-Pay:  DME:  80%     Co-Pay: 20% Providers:  Pt. choice SECONDARY: [place]      Policy#: [id number]      Subscriber:  self CM Name:       Phone#:      Fax#:  Pre-Cert#:       Employer:  Benefits:  Phone #:      Name:  Eff. 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 Ralph Dowdy neurology services 10/02/2015.Patient did receive inpatient rehabilitation service in July 2017 for debilitation secondary to complete heart block that required pacemaker and was discharged to home supervision level.Per chart review patient lives with family. Independent with assistive device prior to admission and driving short distances. One level home with 3 steps to entry. Presented 10/14/2016 with altered mental status reported by his daughter. Also noted there was a deer tickfound on patient's left thigh outer medial aspect. Cranial CT reviewed, showing acute right cerebellar infarct. Per report, acute recent infarct in the distribution of a portion of the right posterior inferior cerebellar artery. There was atrophy with extensive supratentorial small vessel disease. Prior infarct noted involving the head of the caudate nucleolus on the right as well as portion of the anterior limb of the right internal capsule. Chest x-ray cardiomegaly with basilar atelectasis. RMSFserology negative maintained on doxycycline. Urine negative nitrite. UDS negative. Troponin elevated 0.16. CTA angiogram of head and neck showed no emergent large vessel occlusion. Echocardiogram with ejection fraction of 35% no wall motion abnormalities. Neurology consulted workup ongoing for acute encephalopathy/CVA. Subcutaneous Lovenox for DVT prophylaxis. Physical and occupational therapy evaluationscompleted with recommendations of physical medicine rehabilitation consult.Admitted for comprehensive rehabilitation program Total: 3  NIH  Past Medical History      Past Medical History:  Diagnosis Date  . Cancer ([place])    hx of prostate ca  . Cataracts,  bilateral    hx of  . CHF (congestive heart failure) ([place])   . Diabetes mellitus   . ED (erectile dysfunction)   . Hyperlipidemia   . Hypertension   . Neuromuscular disorder ([facility])    numbness in hand/cervical issues  . Pneumonia    hx of  . Syncope    hx of  . Urination frequency     Family History  family history includes Diabetes in his mother; Hypertension in his father.  Prior Rehab/Hospitalizations:  Has the patient had major surgery during 100 days prior to admission? No  Current Medications   Current Facility-Administered Medications:  .  acetaminophen (TYLENOL) tablet 650 mg, 650 mg, Oral, Q4H PRN, 650 mg at 10/17/16 1640 **OR** acetaminophen (TYLENOL) solution 650 mg, 650 mg, Per Tube, Q4H PRN **OR** acetaminophen (TYLENOL) suppository 650 mg, 650 mg, Rectal, Q4H PRN, Samtani, Jai-Gurmukh, MD .  aspirin EC tablet 81 mg, 81 mg, Oral, Daily, Hongalgi, Anand D, MD, 81 mg at 10/18/16 1145 .  doxycycline (VIBRA-TABS) tablet 100 mg, 100 mg, Oral, Q12H, Samtani, Jai-Gurmukh, MD, 100 mg at 10/18/16 0809 .  enoxaparin (LOVENOX) injection 40 mg, 40 mg, Subcutaneous, Q24H, Skeet Simmer, [facility] .  insulin aspart (novoLOG) injection 0-15 Units, 0-15 Units, Subcutaneous, TID WC, Hongalgi, Lenis Dickinson, MD, 3 Units at 10/18/16 0809 .  insulin aspart (novoLOG) injection 0-5 Units, 0-5 Units, Subcutaneous, QHS, Hongalgi, Anand D, MD .  insulin aspart (novoLOG) injection 3 Units, 3 Units, Subcutaneous, TID WC, Nita Sells, MD, 3 Units at 10/18/16 0809 .  insulin glargine (LANTUS) injection 20 Units, 20 Units, Subcutaneous, Daily, Nita Sells, MD, 20 Units at 10/18/16 1145 .  metFORMIN (GLUCOPHAGE-XR) 24 hr tablet 500 mg, 500 mg, Oral, Q breakfast, Samtani, Jai-Gurmukh, MD, 500 mg at 10/18/16 0809  Patients Current Diet: Diet Carb Modified Fluid consistency: Thin; Room service appropriate? Yes Diet -  low sodium heart healthy Diet Carb  Modified  Precautions / Restrictions Precautions Precautions: Fall Restrictions Weight Bearing Restrictions: No   Has the patient had 2 or more falls or a fall with injury in the past year?Yes  Prior Activity Level Community (5-7x/wk): Daughter states pt. drives and would go to [employer] for breakfast every morning and out for lunch daily.  He also would go shopping and running errands  [place] Devices/Equipment: Environmental consultant (specify type) Home Equipment: Cane - quad, Hand held shower head, Grab bars - tub/shower, Shower seat  Prior Device Use: Indicate devices/aids used by the patient prior to current illness, exacerbation or injury? cane and walker  Prior Functional Level Prior Function Level of Independence: Independent with assistive device(s) Comments: used cane, drove himself, daughter took care of home management  Self Care: Did the patient need help bathing, dressing, using the toilet or eating?  Independent  Indoor Mobility: Did the patient need assistance with walking from room to room (with or without device)? Independent  Stairs: Did the patient need assistance with internal or external stairs (with or without device)? Independent  Functional Cognition: Did the patient need help planning regular tasks such as shopping or remembering to take medications? Needed some help  Current Functional Level Cognition  Arousal/Alertness: Awake/alert Overall Cognitive Status: Impaired/Different from baseline Current Attention Level: Sustained Orientation Level: Oriented to person, Disoriented to place, Disoriented to time, Disoriented to situation Following Commands: Follows one step commands with increased time Safety/Judgement: Decreased awareness of safety, Decreased awareness of deficits General Comments: thought he was at [facility]; not aware of having a sroke; seeing people in his room/hallucinations Attention:  Sustained Sustained Attention: Impaired Sustained Attention Impairment: Verbal basic, Functional basic Memory: Impaired Memory Impairment: Storage deficit, Decreased recall of new information, Decreased short term memory Decreased Short Term Memory: Verbal basic, Functional basic Awareness: Impaired Awareness Impairment: Intellectual impairment, Emergent impairment Problem Solving: Impaired Problem Solving Impairment: Verbal basic, Functional basic Behaviors: Perseveration Safety/Judgment: Impaired    Extremity Assessment (includes Sensation/Coordination)  Upper Extremity Assessment: Defer to OT evaluation (functional strength in the horizontal plane shoulders <90*)  Lower Extremity Assessment: RLE deficits/detail, LLE deficits/detail RLE Deficits / Details: R LE stronger than L LE and generally functional RLE Coordination: decreased fine motor LLE Deficits / Details: generally weak, not as well coordinated during check of movement quallity.  pt unable to conceptualize heel/shin test. LLE Coordination: decreased fine motor    ADLs  Overall ADL's : Needs assistance/impaired General ADL Comments: focus of session on self feeding. Pt required set up and mod redirectional cues to complete tasks. Initially leaning to L with mod amounts of food in bed without awareness. Max A to position upright in bed. Unjable to problem solve how to pull self upright in bed    Mobility  Overal bed mobility: Needs Assistance Bed Mobility: Supine to Sit, Sit to Supine Supine to sit: Max assist Sit to supine: Mod assist General bed mobility comments: needed to assist scoot to EOB, frequent redirection to task at hand    Transfers  Overall transfer level: Needs assistance Equipment used: Rolling walker (2 wheeled) Transfers: Sit to/from Stand, Stand Pivot Transfers Sit to Stand: Max assist, Mod assist, +2 physical assistance Stand pivot transfers: Max assist, +2 physical assistance General  transfer comment: Again max multimodal cuing to stay on task, help with sequencing, postural checks,: assist for helping forward, up and stability as pt leans significantly posteriorly    Ambulation /  Gait / Stairs / Emergency planning/management officer  Ambulation/Gait General Gait Details: Did not progress to ambulation today due to too much time spent toileting and redirecting pt.    Posture / Balance Dynamic Sitting Balance Sitting balance - Comments: still tending to list left, but pt better able to control his balance and assist just close by Balance Overall balance assessment: Needs assistance Sitting-balance support: Feet supported, Single extremity supported Sitting balance-Leahy Scale: Fair Sitting balance - Comments: still tending to list left, but pt better able to control his balance and assist just close by Postural control: Posterior lean, Left lateral lean Standing balance support: Bilateral upper extremity supported Standing balance-Leahy Scale: Poor Standing balance comment: mod for static balance, max  otherwise.    Special needs/care consideration BiPAP/CPAP    no CPM   no Continuous Drip IV   no Dialysis   no        Life Vest    no Oxygen   No  Special Bed   no Trach Size   n/a Wound Vac (area)   no      Location    Skin   WDLs                           Bowel mgmt:   Incontinent, 10/14/16 last BM Bladder mgmt:  incontinent Diabetic mgmt  novoLOG and lantus at home     Previous Home Environment Living Arrangements: Children  Lives With: Family Available Help at Discharge: Family, Available PRN/intermittently Type of Home: House Home Layout: One level Home Access: Stairs to enter Entrance Stairs-Rails: Right, Left, Can reach both Entrance Stairs-Number of Steps: 3 Bathroom Shower/Tub: Multimedia programmer: Associate Professor Accessibility: Yes Home Care Services: No Additional Comments: Information based on prior admission as pt is poor  historian  Discharge Living Setting Plans for Discharge Living Setting: Patient's home Type of Home at Discharge: House Discharge Home Layout: One level Discharge Home Access: Stairs to enter Entrance Stairs-Rails: Right, Left Entrance Stairs-Number of Steps: 4 Discharge Bathroom Shower/Tub: Tub/shower unit Discharge Bathroom Toilet: Handicapped height Discharge Bathroom Accessibility: Yes How Accessible: Accessible via walker Does the patient have any problems obtaining your medications?: No  Social/Family/Support Systems Patient Roles: Parent Anticipated Caregiver: Daughter, Keelon Zurn Anticipated Caregiver's Contact Information: [phone] Ability/Limitations of Caregiver: no limitations, daughter does not work and has been caring for her dad in the home setting., managing the household.  I discussed with Willette Cluster that we anticipate her dad will need min and mod assist following a CIR stay.  she indicates she is concerned about her ability to care for her dad on an ongoing basis   Caregiver Availability: 24/7 Discharge Plan Discussed with Primary Caregiver: Yes Is Caregiver In Agreement with Plan?: Yes Does Caregiver/Family have Issues with Lodging/Transportation while Pt is in Rehab?: No   Goals/Additional Needs Patient/Family Goal for Rehab: min/mod assist PT/OT; min assist SLP Expected length of stay: 18-22 days Cultural Considerations: n/a Dietary Needs: carb modified/thin liquids Equipment Needs: TBA Pt/Family Agrees to Admission and willing to participate: Yes Program Orientation Provided & Reviewed with Pt/Caregiver Including Roles  & Responsibilities: Yes   Decrease burden of Care through IP rehab admission:    Possible need for SNF placement upon discharge:   Daughter questions if she can continue to care for her father following rehab but is willing to pursue rehab with intention of home discharge   Patient Condition: This patient's medical and  functional status  has changed since the consult dated 10/18/16 in which the Rehabilitation Physician determined and documented that the patient was potentially appropriate for intensive rehabilitative care in an inpatient rehabilitation facility.  Pt. was alert and able to tolerate PT session this am.  I consulted with Dr. Posey Pronto,  issues have been addressed and update has been discussed with Dr. Posey Pronto.   Patient now appropriate for inpatient rehabilitation. Will admit to inpatient rehab today.   Preadmission Screen Completed By:  Gerlean Ren, 10/18/2016 2:27 PM ______________________________________________________________________   Discussed status with Dr. Posey Pronto on 10/18/16 at  1427  and received telephone approval for admission today.  Admission Coordinator:  Gerlean Ren, time 1427 Sudie Grumbling 10/18/16

## 2016-10-18 NOTE — Progress Notes (Addendum)
  Speech Language Pathology Treatment: Cognitive-Linquistic  Patient Details Name: [name] MRN: [id number] DOB: [birth year] Today's Date: 10/18/2016 Time: 4469-5072 SLP Time Calculation (min) (ACUTE ONLY): 10 min  Assessment / Plan / Recommendation Clinical Impression  Pt poorly arousable this am - irritable when coaxed to awaken for session.  Confused, oriented to person only; no intellectual awareness.  Max assist provided to engage pt in oral care, follow instructions, and participate in general. Pt wanted to be left alone to sleep. Agree with necessity of 24/7 supervision s/p D/C.   HPI HPI: This is an 83 year old man brought to the emergency room for evaluation of altered mental status. History is limited to review the patient's medical record. He is unable to provide much in the way of useful information. pt has a history of moderate-severe cognitive impairment 5/17 with [facility] and lives with family at this time; CT head on 10/14/16 indicated  Findings felt to represent an acute/recent infarct in the distribution of a portion of the right posterior inferior cerebellar artery.      SLP Plan  Continue with current plan of care       Recommendations                   Oral Care Recommendations: Oral care BID Plan: Continue with current plan of care       GO                [name] 10/18/2016, 11:06 AM

## 2016-10-18 NOTE — Progress Notes (Signed)
Occupational Therapy Treatment Patient Details Name: [name] MRN: [id number] DOB: [birth year] Today's Date: 10/18/2016    History of present illness This is an 83 year old man brought to the emergency room for evaluation of altered mental status. History is limited to review the patient's medical record. He is unable to provide much in the way of useful information. No family is present at the bedside pt has a history of moderate-severe cognitive impairment 5/17 with [facility] and lives with family at this time; CT head on 10/14/16 indicated findings felt to represent an acute/recent infarct in the distribution of a portion of the right posterior inferior cerebellar artery.   OT comments  Focus of session on self feeding. Max A to position pt at midline in bed. Mod redirectional cues to complete breakfast. Pt confused and appears to be having visual hallucinations. Not oriented to place or situation. Pt will benefit from post acute rehab. Recommendations made for CIR - pt will need 24/7 assistance.Will continue to follow acutely to address established goals and facilitate DC to next venue of care.   Follow Up Recommendations  CIR; 24/7 S   Equipment Recommendations  Other (comment) (TBA)    Recommendations for Other Services Rehab consult    Precautions / Restrictions Precautions Precautions: Fall       Mobility Bed Mobility               General bed mobility comments: Max A to return to upright midline position. Positioned with pillows L trunk to maintain upright posutre in bed  Transfers                      Balance                                           ADL either performed or assessed with clinical judgement   ADL Overall ADL's : Needs assistance/impaired                                       General ADL Comments: focus of session on self feeding. Pt feeding self with R hand. Using L hand as functional assist.  At times, pt unaware of bathcloth being on fork and required cues to correct. Distracted by apparent visual hallucinations during breakfast. Pt required set up and mod redirectional cues to complete tasks. Initially leaning to L with mod amounts of food in bed without awareness. Max A to position upright in bed. Unjable to problem solve how to pull self upright in bed Discussed need to proper positioning, set up and to reduce distractions during meals with nsg     Vision       Perception     Praxis      Cognition Arousal/Alertness: Awake/alert Behavior During Therapy: [facility] for tasks assessed/performed Overall Cognitive Status: Impaired/Different from baseline Area of Impairment: Orientation;Attention;Memory;Safety/judgement;Awareness;Problem solving                 Orientation Level: Disoriented to;Place;Time;Situation Current Attention Level: Sustained Memory: Decreased short-term memory Following Commands: Follows one step commands with increased time Safety/Judgement: Decreased awareness of safety;Decreased awareness of deficits Awareness: Intellectual Problem Solving: Slow processing;Difficulty sequencing;Requires verbal cues General Comments: thought he was at [facility]; not aware of having a sroke; seeing people in  his room/hallucinations        Exercises     Shoulder Instructions       General Comments      Pertinent Vitals/ Pain       Pain Assessment: Faces Faces Pain Scale: No hurt  Home Living                                          Prior Functioning/Environment              Frequency  Min 2X/week        Progress Toward Goals  OT Goals(current goals can now be found in the care plan section)  Progress towards OT goals: Progressing toward goals  Acute Rehab OT Goals Patient Stated Goal: to eat Time For Goal Achievement: 10/29/16 Potential to Achieve Goals: Fair ADL Goals Pt Will Perform Eating: with  set-up;sitting Pt Will Perform Grooming: sitting;with min assist Pt Will Perform Upper Body Bathing: with min assist;sitting Pt Will Perform Lower Body Bathing: with mod assist;sit to/from stand Pt Will Perform Upper Body Dressing: with min assist;sitting Pt Will Perform Lower Body Dressing: with mod assist;sitting/lateral leans Pt Will Transfer to Toilet: with mod assist Pt Will Perform Toileting - Clothing Manipulation and hygiene: with mod assist  Plan Discharge plan remains appropriate    Co-evaluation                 AM-PAC PT "6 Clicks" Daily Activity     Outcome Measure   Help from another person eating meals?: A Little Help from another person taking care of personal grooming?: A Lot Help from another person toileting, which includes using toliet, bedpan, or urinal?: A Lot Help from another person bathing (including washing, rinsing, drying)?: A Lot Help from another person to put on and taking off regular upper body clothing?: A Lot Help from another person to put on and taking off regular lower body clothing?: A Lot 6 Click Score: 13    End of Session    OT Visit Diagnosis: Muscle weakness (generalized) (M62.81);Unsteadiness on feet (R26.81)   Activity Tolerance Patient tolerated treatment well   Patient Left in bed;with call bell/phone within reach;with bed alarm set   Nurse Communication Mobility status        Time: 0454-0981 OT Time Calculation (min): 25 min  Charges: OT General Charges $OT Visit: 1 Procedure OT Treatments $Self Care/Home Management : 23-37 mins  [facility], OT/L  [phone] 10/18/2016   Berry Gallacher,HILLARY 10/18/2016, 8:48 AM

## 2016-10-18 NOTE — Discharge Instructions (Signed)

## 2016-10-18 NOTE — Progress Notes (Signed)
Patient admitted to 4W10 via bed, escorted by nursing staff.  Patient oriented to unit as best as possible due to patients current cognition.  Patient has been in rehab here before.  Patient appears to be in no immediate distress at this time.  Brita Romp, RN

## 2016-10-18 NOTE — Progress Notes (Signed)
Physical Therapy Treatment Patient Details Name: [name] MRN: [id number] DOB: Feb 04, 1931 Today's Date: 10/18/2016    History of Present Illness This is an 83 year old man brought to the emergency room for evaluation of altered mental status. History is limited to review the patient's medical record. He is unable to provide much in the way of useful information. No family is present at the bedside pt has a history of moderate-severe cognitive impairment 5/17 with [name] and lives with family at this time; CT head on 10/14/16 indicated findings felt to represent an acute/recent infarct in the distribution of a portion of the right posterior inferior cerebellar artery.    PT Comments    Pt slow to progress, limited by pt inability to focus on task and labile behaviors.  Emphasis on safety,  standing and transfers.  Follow Up Recommendations  CIR;Supervision/Assistance - 24 hour     Equipment Recommendations  None recommended by PT    Recommendations for Other Services Rehab consult     Precautions / Restrictions Precautions Precautions: Fall Restrictions Weight Bearing Restrictions: No    Mobility  Bed Mobility Overal bed mobility: Needs Assistance Bed Mobility: Supine to Sit;Sit to Supine     Supine to sit: Max assist     General bed mobility comments: needed to assist scoot to EOB, frequent redirection to task at hand  Transfers Overall transfer level: Needs assistance Equipment used: Rolling walker (2 wheeled) Transfers: Sit to/from [employer] Transfers Sit to Stand: Max assist;Mod assist;+2 physical assistance Stand pivot transfers: Max assist;+2 physical assistance       General transfer comment: Again max multimodal cuing to stay on task, help with sequencing, postural checks,: assist for helping forward, up and stability as pt leans significantly posteriorly  Ambulation/Gait             General Gait Details: Did not progress to  ambulation today due to too much time spent toileting and redirecting pt.   Stairs            Wheelchair Mobility    Modified Rankin (Stroke Patients Only) Modified Rankin (Stroke Patients Only) Pre-Morbid Rankin Score: No symptoms Modified Rankin: Severe disability     Balance Overall balance assessment: Needs assistance Sitting-balance support: Feet supported;Single extremity supported Sitting balance-Leahy Scale: Fair Sitting balance - Comments: still tending to list left, but pt better able to control his balance and assist just close by   Standing balance support: Bilateral upper extremity supported Standing balance-Leahy Scale: Poor Standing balance comment: mod for static balance, max  otherwise.                            Cognition Arousal/Alertness: Awake/alert Behavior During Therapy: WFL for tasks assessed/performed;Agitated (flip/flops from agitated to sociable and back) Overall Cognitive Status: Impaired/Different from baseline Area of Impairment: Orientation;Attention;Memory;Safety/judgement;Awareness;Problem solving                 Orientation Level: Place;Time;Situation;Disoriented to Current Attention Level: Sustained Memory: Decreased short-term memory Following Commands: Follows one step commands with increased time Safety/Judgement: Decreased awareness of safety;Decreased awareness of deficits Awareness: Intellectual Problem Solving: Slow processing;Decreased initiation;Difficulty sequencing;Requires verbal cues General Comments: thought he was at [facility]; not aware of having a sroke; seeing people in his room/hallucinations      Exercises      General Comments General comments (skin integrity, edema, etc.): pt's mood labile switching between cordial and agitated.  Very difficult to keep  pt on task.      Pertinent Vitals/Pain Pain Assessment: Faces Pain Score: 0-No pain Faces Pain Scale: No hurt    Home Living                       Prior Function            PT Goals (current goals can now be found in the care plan section) Acute Rehab PT Goals Patient Stated Goal: to get out of here... PT Goal Formulation: With patient Time For Goal Achievement: 10/30/16 Potential to Achieve Goals: Fair Progress towards PT goals: Progressing toward goals    Frequency    Min 3X/week      PT Plan Current plan remains appropriate    Co-evaluation              AM-PAC PT "6 Clicks" Daily Activity  Outcome Measure  Difficulty turning over in bed (including adjusting bedclothes, sheets and blankets)?: Total Difficulty moving from lying on back to sitting on the side of the bed? : Total Difficulty sitting down on and standing up from a chair with arms (e.g., wheelchair, bedside commode, etc,.)?: Total Help needed moving to and from a bed to chair (including a wheelchair)?: A Lot Help needed walking in hospital room?: A Lot Help needed climbing 3-5 steps with a railing? : Total 6 Click Score: 8    End of Session   Activity Tolerance: Patient tolerated treatment well Patient left: in chair;with call bell/phone within reach;with chair alarm set Nurse Communication: Mobility status PT Visit Diagnosis: Unsteadiness on feet (R26.81);Muscle weakness (generalized) (M62.81);Other symptoms and signs involving the nervous system (R29.898)     Time: 0998-3382 PT Time Calculation (min) (ACUTE ONLY): 32 min  Charges:  $Therapeutic Activity: 23-37 mins                    G Codes:       November 02, 2016  [name], PT [phone]  (pager)   [name] [name] Nov 02, 2016, 11:41 AM

## 2016-10-18 NOTE — PMR Pre-admission (Signed)
PMR Admission Coordinator Pre-Admission Assessment  Patient: [name] is an 83 y.o., male MRN: [id number] DOB: November 26, 1930 Height: 5\' 10"  (177.8 cm) Weight: 75 kg (165 lb 5.5 oz)              Insurance Information HMO:      PPO:      PCP:      IPA:      80/20:      OTHER:  PRIMARY:  Medicare A and B      Policy#: [id number]      Subscriber:  self CM Name:        Phone#:      Fax#:  Pre-Cert#:        Employer:  retired Benefits:  Phone #:      Name:  Eff. Date:  11/07/97     Deduct:  $1340      Out of Pocket Max:  n/a      Life Max:  n/a CIR:  100%      SNF: 100% Outpatient:  80%/20%     Co-Pay:  Home Health: 100%      Co-Pay:  DME:  80%     Co-Pay: 20% Providers:  Pt. choice SECONDARY: [facility]      Policy#: [id number]      Subscriber:  self CM Name:       Phone#:      Fax#:  Pre-Cert#:       Employer:  Benefits:  Phone #:      Name:  Eff. Date:      Deduct:       Out of Pocket Max:       Life Max:  CIR:       SNF:  Outpatient:      Co-Pay:  Home Health:       Co-Pay:  DME:      Co-Pay:   Medicaid Application Date:        Case Manager:  Disability Application Date:       Case Worker:   Emergency Contact Information Contact Information    Name Relation Home Work Mobile   Matera,Lynnette Daughter [phone]  [phone]   Dorna Mai [phone]       Current Medical History  Patient Admitting Diagnosis: Acute recent infarct in the distribution of a portion of the right posterior inferior cerebellar artery History of Present Illness: [name] a 83 y.o.right handed malewith history of Chronic systolic congestive heart failure, hypertension, CKD stage III, diabetes mellitus, CAD with pacemaker maintained on aspirin therapy as well as underlying unspecified dementia seen by Dr. Ralph Dowdy neurology services 10/02/2015.Patient did receive inpatient rehabilitation service in July 2017 for debilitation secondary to complete heart block that required  pacemaker and was discharged to home supervision level. Per chart review patient lives with family. Independent with assistive device prior to admission and driving short distances. One level home with 3 steps to entry. Presented 10/14/2016 with altered mental status reported by his daughter. Also noted there was a deer tickfound on patient's left thigh outer medial aspect. Cranial CT reviewed, showing acute right cerebellar infarct. Per report, acute recent infarct in the distribution of a portion of the right posterior inferior cerebellar artery. There was atrophy with extensive supratentorial small vessel disease. Prior infarct noted involving the head of the caudate nucleolus on the right as well as portion of the anterior limb of the right internal capsule. Chest x-ray cardiomegaly with basilar atelectasis. RMSFserology negative maintained  on doxycycline. Urine negative nitrite. UDS negative. Troponin elevated 0.16. CTA angiogram of head and neck showed no emergent large vessel occlusion. Echocardiogram with ejection fraction of 35% no wall motion abnormalities. Neurology consulted workup ongoing for acute encephalopathy/CVA. Subcutaneous Lovenox for DVT prophylaxis. Physical and occupational  therapy evaluations completed with recommendations of physical medicine rehabilitation consult.Admitted for comprehensive rehabilitation program Total: 3  NIH    Past Medical History  Past Medical History:  Diagnosis Date  . Cancer ([place])    hx of prostate ca  . Cataracts, bilateral    hx of  . CHF (congestive heart failure) ([place])   . Diabetes mellitus   . ED (erectile dysfunction)   . Hyperlipidemia   . Hypertension   . Neuromuscular disorder ([facility])    numbness in hand/cervical issues  . Pneumonia    hx of  . Syncope    hx of  . Urination frequency     Family History  family history includes Diabetes in his mother; Hypertension in his father.  Prior Rehab/Hospitalizations:  Has the patient  had major surgery during 100 days prior to admission? No  Current Medications   Current Facility-Administered Medications:  .  acetaminophen (TYLENOL) tablet 650 mg, 650 mg, Oral, Q4H PRN, 650 mg at 10/17/16 1640 **OR** acetaminophen (TYLENOL) solution 650 mg, 650 mg, Per Tube, Q4H PRN **OR** acetaminophen (TYLENOL) suppository 650 mg, 650 mg, Rectal, Q4H PRN, Samtani, Jai-Gurmukh, MD .  aspirin EC tablet 81 mg, 81 mg, Oral, Daily, Hongalgi, Anand D, MD, 81 mg at 10/18/16 1145 .  doxycycline (VIBRA-TABS) tablet 100 mg, 100 mg, Oral, Q12H, Samtani, Jai-Gurmukh, MD, 100 mg at 10/18/16 0809 .  enoxaparin (LOVENOX) injection 40 mg, 40 mg, Subcutaneous, Q24H, Skeet Simmer, [facility] .  insulin aspart (novoLOG) injection 0-15 Units, 0-15 Units, Subcutaneous, TID WC, Hongalgi, Lenis Dickinson, MD, 3 Units at 10/18/16 0809 .  insulin aspart (novoLOG) injection 0-5 Units, 0-5 Units, Subcutaneous, QHS, Hongalgi, Anand D, MD .  insulin aspart (novoLOG) injection 3 Units, 3 Units, Subcutaneous, TID WC, Nita Sells, MD, 3 Units at 10/18/16 0809 .  insulin glargine (LANTUS) injection 20 Units, 20 Units, Subcutaneous, Daily, Nita Sells, MD, 20 Units at 10/18/16 1145 .  metFORMIN (GLUCOPHAGE-XR) 24 hr tablet 500 mg, 500 mg, Oral, Q breakfast, Samtani, Jai-Gurmukh, MD, 500 mg at 10/18/16 0809  Patients Current Diet: Diet Carb Modified Fluid consistency: Thin; Room service appropriate? Yes Diet - low sodium heart healthy Diet Carb Modified  Precautions / Restrictions Precautions Precautions: Fall Restrictions Weight Bearing Restrictions: No   Has the patient had 2 or more falls or a fall with injury in the past year?Yes  Prior Activity Level Community (5-7x/wk): Daughter states pt. drives and would go to [employer] for breakfast every morning and out for lunch daily.  He also would go shopping and running errands  [place] / [place] Devices/Equipment: Environmental consultant  (specify type) Home Equipment: Cane - quad, Hand held shower head, Grab bars - tub/shower, Shower seat  Prior Device Use: Indicate devices/aids used by the patient prior to current illness, exacerbation or injury? cane and walker  Prior Functional Level Prior Function Level of Independence: Independent with assistive device(s) Comments: used cane, drove himself, daughter took care of home management  Self Care: Did the patient need help bathing, dressing, using the toilet or eating?  Independent  Indoor Mobility: Did the patient need assistance with walking from room to room (with or without device)? Independent  Stairs: Did the  patient need assistance with internal or external stairs (with or without device)? Independent  Functional Cognition: Did the patient need help planning regular tasks such as shopping or remembering to take medications? Needed some help  Current Functional Level Cognition  Arousal/Alertness: Awake/alert Overall Cognitive Status: Impaired/Different from baseline Current Attention Level: Sustained Orientation Level: Oriented to person, Disoriented to place, Disoriented to time, Disoriented to situation Following Commands: Follows one step commands with increased time Safety/Judgement: Decreased awareness of safety, Decreased awareness of deficits General Comments: thought he was at [facility]; not aware of having a sroke; seeing people in his room/hallucinations Attention: Sustained Sustained Attention: Impaired Sustained Attention Impairment: Verbal basic, Functional basic Memory: Impaired Memory Impairment: Storage deficit, Decreased recall of new information, Decreased short term memory Decreased Short Term Memory: Verbal basic, Functional basic Awareness: Impaired Awareness Impairment: Intellectual impairment, Emergent impairment Problem Solving: Impaired Problem Solving Impairment: Verbal basic, Functional basic Behaviors:  Perseveration Safety/Judgment: Impaired    Extremity Assessment (includes Sensation/Coordination)  Upper Extremity Assessment: Defer to OT evaluation (functional strength in the horizontal plane shoulders <90*)  Lower Extremity Assessment: RLE deficits/detail, LLE deficits/detail RLE Deficits / Details: R LE stronger than L LE and generally functional RLE Coordination: decreased fine motor LLE Deficits / Details: generally weak, not as well coordinated during check of movement quallity.  pt unable to conceptualize heel/shin test. LLE Coordination: decreased fine motor    ADLs  Overall ADL's : Needs assistance/impaired General ADL Comments: focus of session on self feeding. Pt required set up and mod redirectional cues to complete tasks. Initially leaning to L with mod amounts of food in bed without awareness. Max A to position upright in bed. Unjable to problem solve how to pull self upright in bed    Mobility  Overal bed mobility: Needs Assistance Bed Mobility: Supine to Sit, Sit to Supine Supine to sit: Max assist Sit to supine: Mod assist General bed mobility comments: needed to assist scoot to EOB, frequent redirection to task at hand    Transfers  Overall transfer level: Needs assistance Equipment used: Rolling walker (2 wheeled) Transfers: Sit to/from Stand, Stand Pivot Transfers Sit to Stand: Max assist, Mod assist, +2 physical assistance Stand pivot transfers: Max assist, +2 physical assistance General transfer comment: Again max multimodal cuing to stay on task, help with sequencing, postural checks,: assist for helping forward, up and stability as pt leans significantly posteriorly    Ambulation / Gait / Stairs / Wheelchair Mobility  Ambulation/Gait General Gait Details: Did not progress to ambulation today due to too much time spent toileting and redirecting pt.    Posture / Balance Dynamic Sitting Balance Sitting balance - Comments: still tending to list left, but pt  better able to control his balance and assist just close by Balance Overall balance assessment: Needs assistance Sitting-balance support: Feet supported, Single extremity supported Sitting balance-Leahy Scale: Fair Sitting balance - Comments: still tending to list left, but pt better able to control his balance and assist just close by Postural control: Posterior lean, Left lateral lean Standing balance support: Bilateral upper extremity supported Standing balance-Leahy Scale: Poor Standing balance comment: mod for static balance, max  otherwise.    Special needs/care consideration BiPAP/CPAP    no CPM   no Continuous Drip IV   no Dialysis   no        Life Vest    no Oxygen   No  Special Bed   no Trach Size   n/a Wound Vac (area)  no      Location    Skin   WDLs                           Bowel mgmt:   Incontinent, 10/14/16 last BM Bladder mgmt:  incontinent Diabetic mgmt  novoLOG and lantus at home     Previous Home Environment Living Arrangements: Children  Lives With: Family Available Help at Discharge: Family, Available PRN/intermittently Type of Home: House Home Layout: One level Home Access: Stairs to enter Entrance Stairs-Rails: Right, Left, Can reach both Entrance Stairs-Number of Steps: 3 Bathroom Shower/Tub: Multimedia programmer: Associate Professor Accessibility: Yes Home Care Services: No Additional Comments: Information based on prior admission as pt is poor historian  Discharge Living Setting Plans for Discharge Living Setting: Patient's home Type of Home at Discharge: House Discharge Home Layout: One level Discharge Home Access: Stairs to enter Entrance Stairs-Rails: Right, Left Entrance Stairs-Number of Steps: 4 Discharge Bathroom Shower/Tub: Tub/shower unit Discharge Bathroom Toilet: Handicapped height Discharge Bathroom Accessibility: Yes How Accessible: Accessible via walker Does the patient have any problems obtaining your medications?:  No  Social/Family/Support Systems Patient Roles: Parent Anticipated Caregiver: Daughter, [name] Anticipated Caregiver's Contact Information: [phone] Ability/Limitations of Caregiver: no limitations, daughter does not work and has been caring for her dad in the home setting., managing the household.  I discussed with Willette Cluster that we anticipate her dad will need min and mod assist following a CIR stay.  she indicates she is concerned about her ability to care for her dad on an ongoing basis   Caregiver Availability: 24/7 Discharge Plan Discussed with Primary Caregiver: Yes Is Caregiver In Agreement with Plan?: Yes Does Caregiver/Family have Issues with Lodging/Transportation while Pt is in Rehab?: No   Goals/Additional Needs Patient/Family Goal for Rehab: min/mod assist PT/OT; min assist SLP Expected length of stay: 18-22 days Cultural Considerations: n/a Dietary Needs: carb modified/thin liquids Equipment Needs: TBA Pt/Family Agrees to Admission and willing to participate: Yes Program Orientation Provided & Reviewed with Pt/Caregiver Including Roles  & Responsibilities: Yes   Decrease burden of Care through IP rehab admission:    Possible need for SNF placement upon discharge:   Daughter questions if she can continue to care for her father following rehab but is willing to pursue rehab with intention of home discharge   Patient Condition: This patient's medical and functional status has changed since the consult dated 10/18/16 in which the Rehabilitation Physician determined and documented that the patient was potentially appropriate for intensive rehabilitative care in an inpatient rehabilitation facility.  Pt. was alert and able to tolerate PT session this am.  I consulted with Dr. Posey Pronto,  issues have been addressed and update has been discussed with Dr. Posey Pronto.   Patient now appropriate for inpatient rehabilitation. Will admit to inpatient rehab today.   Preadmission  Screen Completed By:  Gerlean Ren, 10/18/2016 2:27 PM ______________________________________________________________________   Discussed status with Dr. Posey Pronto on 10/18/16 at  1427  and received telephone approval for admission today.  Admission Coordinator:  Gerlean Ren, time 1427 Sudie Grumbling 10/18/16

## 2016-10-18 NOTE — Discharge Summary (Signed)
Physician Discharge Summary  [name] QIO:[id number] DOB: 1930/10/11  PCP: Elayne Snare, MD  Admit date: 10/14/2016 Discharge date: 10/18/2016  Recommendations for Outpatient Follow-up:  1. Patient is being discharged to [facility]. 2. Dr. Elayne Snare, PCP upon discharge from [facility]. 3. Dr. Wells Guiles Tat, Neurology in 4 weeks. I have sent a electronic ambulatory referral.  Home Health: None Equipment/Devices: None    Discharge Condition: Improved and stable  CODE STATUS: Full  Diet recommendation: Heart healthy & diabetic diet.  Discharge Diagnoses:  Principal Problem:   Acute encephalopathy Active Problems:   Tick bite of calf, initial encounter   Stage 3 chronic kidney disease   Diabetes mellitus type 2 in nonobese [facility])   Coronary artery disease involving native coronary artery of native heart without angina pectoris   Dementia without behavioral disturbance   Chronic systolic heart failure [facility])   Brief Summary: 83 year old male with PMH of prostate cancer, chronic systolic CHF, pacemaker implantation, DM 2, HLD, HTN, syncope, dementia, presented to ED with new onset mental status changes. A deer tick was apparently found on his body in the ED. Initial concern for acute CVA. Neurology was consulted.   Assessment & Plan:   1. Acute encephalopathy: Extensively evaluated. Neurology consultation and follow-up appreciated. No obvious etiologies for increased confusion have been identified. As per neurology, this may be simply reflective of his underlying dementia and its progression and bouts of confusion can occur in those with advanced dementia even without clear provocation or with minor provocation which can be difficult to identify. Avoid any medications that might precipitate confusion. Optimize sleep cycle. Control hyperglycemia. Repeat BMP unremarkable. UDS negative. Blood alcohol level on admission negative. One of 2 blood cultures positive for  coagulase-negative staph, likely contaminant. RMSF IgG & IgM: Negative. Chest x-ray without pneumonia. 2. Cerebrovascular disease: As per neurology, he has significant chronic small vessel ischemic disease but no evidence of acute stroke. Continue risk factor modification >lipids are currently at goal so would not add statins. Continue home dose of aspirin as per neurology recommendations. 3. Dementia: As per neurology, severe. This can be evaluated further by outpatient neurology. Also he should no longer be allowed to drive given his dementia >this was specifically discussed with his daughter/care provider who verbalized understanding. Also concerns about his ability to care for himself and is likely to require constant supervision going forward >this also was discussed with daughter who verbalized understanding. As per chart review, patient had seen OP neurologist in May 2017 for dementia-may follow up as outpatient. 4. Chronic combined systolic & diastolic CHF/cardiomyopathy/PPM for complete heart block: 2-D echo 10/15/16: LVEF 30-35 percent. Euvolemic/compensated. EF maybe slightly worse than July 2017. Outpatient follow-up as needed. Continue Lasix. Last seen by his primary cardiologist in February and next visit in 6 months from then. 5. Type II DM: A1c 7.5. Uncontrolled. Continue home dose of Lantus (changed to morning dosing), NovoLog mealtime and metformin (changed to morning dosing). Adjust insulins as needed. 6. Stage 2-III chronic kidney disease: Creatinine 1.3 on admission. Repeat creatinine 1.2. Follow creatinine periodically. 7. Essential hypertension: Currently controlled off of antihypertensives. 8. Prostate cancer history: Status post surgery and radiation. 9. Tick exposure: Not sure if bite. RMSF serology negative. On oral doxycycline while hospitalized. Discussed with infectious disease M.D. on call who recommended no antibiotics at discharge.   Consultants:   Neurology Rehabilitation M.D.   Procedures:  2-D echo 10/15/16: Study Conclusions  - Left ventricle: The cavity size was normal.  There was moderate   concentric hypertrophy. Systolic function was moderately to   severely reduced. The estimated ejection fraction was in the   range of 30% to 35%. Wall motion was normal; there were no   regional wall motion abnormalities. - Aortic valve: There is decreased leaflet excursion with normal   mean AV gradient but this could be underestimated due to   significant LV dysfunction. Valve mobility was restricted. - Mitral valve: Severely calcified annulus. Severe diffuse   thickening and calcification of the anterior leaflet, with severe   involvement of chords. Mobility was restricted. There was mild   regurgitation. - Left atrium: The atrium was mildly dilated. - Right ventricle: The cavity size was moderately dilated. Wall   thickness was normal. Systolic function was moderately to   severely reduced. - Right atrium: The atrium was moderately dilated. - Pulmonary arteries: Systolic pressure could not be accurately   estimated. - Inferior vena cava: The vessel was dilated. The respirophasic   diameter changes were blunted (< 50%), consistent with elevated   central venous pressure.   Discharge Instructions  Discharge Instructions    Ambulatory referral to Neurology    Complete by:  As directed    An appointment is requested in approximately: 4 weeks   Call MD for:    Complete by:  As directed    Worsening confusion or altered mental status.   Diet - low sodium heart healthy    Complete by:  As directed    Diet Carb Modified    Complete by:  As directed    Driving Restrictions    Complete by:  As directed    Going forward, patient is advised not to drive. This was discussed with his daughter who verbalized understanding.   Increase activity slowly    Complete by:  As directed        Medication List    STOP taking these  medications   aspirin 81 MG chewable tablet Replaced by:  aspirin 81 MG EC tablet     TAKE these medications   aspirin 81 MG EC tablet Take 1 tablet (81 mg total) by mouth daily. Start taking on:  10/19/2016 Replaces:  aspirin 81 MG chewable tablet   furosemide 20 MG tablet Commonly known as:  LASIX Take 1 tablet (20 mg total) by mouth daily.   insulin glargine 100 UNIT/ML injection Commonly known as:  LANTUS Inject 0.2 mLs (20 Units total) into the skin daily. Start taking on:  10/19/2016 What changed:  when to take this   metFORMIN 500 MG 24 hr tablet Commonly known as:  GLUCOPHAGE-XR Take 2 tablets (1,000 mg total) by mouth daily with breakfast. Start taking on:  10/19/2016 What changed:  how much to take  how to take this  when to take this  additional instructions   nitroGLYCERIN 0.4 MG SL tablet Commonly known as:  NITROSTAT Place 1 tablet (0.4 mg total) under the tongue every 5 (five) minutes as needed for chest pain.   NOVOLOG 100 UNIT/ML injection Generic drug:  insulin aspart INJECT 5 UNITS INTO THE SKIN 3 TIMES DAILY WITH MEALS What changed:  Another medication with the same name was removed. Continue taking this medication, and follow the directions you see here.   ONETOUCH VERIO test strip Generic drug:  glucose blood USE TO TEST 4 TIMES DAILY   vitamin B-12 1000 MCG tablet Commonly known as:  CYANOCOBALAMIN Take 500 mcg by mouth daily.   VITAMIN D3 PO  Take 1 capsule by mouth daily. 182mcg      Follow-up Information    Elayne Snare, MD. Schedule an appointment as soon as possible for a visit.   Specialty:  Endocrinology Why:  Upon discharge from [facility]. Contact information: [address] [place] [address] [phone]        M.D. at [facility]. Schedule an appointment as soon as possible for a visit.          No Known Allergies    Procedures/Studies: Ct Angio Head W Or Wo Contrast  Result Date: 10/15/2016 CLINICAL DATA:   83 year old male with confusion, altered mental status. Age indeterminate Right PICA territory infarct on head CT yesterday. EXAM: CT ANGIOGRAPHY HEAD AND NECK TECHNIQUE: Multidetector CT imaging of the head and neck was performed using the standard protocol during bolus administration of intravenous contrast. Multiplanar CT image reconstructions and MIPs were obtained to evaluate the vascular anatomy. Carotid stenosis measurements (when applicable) are obtained utilizing NASCET criteria, using the distal internal carotid diameter as the denominator. CONTRAST:  50 mL Isovue 370 COMPARISON:  Head CT without contrast 10/14/2016. Cervical spine MRI 11/06/2011. 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 Posterior fusion hardware at C7-T1 with evidence of posterior element ankylosis at that level. Bulky anterior endplate osteophytosis without ankylosis at C2-C3. Bulky partially calcified ligamentous hypertrophy about the odontoid. No acute osseous abnormality identified. Upper chest: Layering bilateral pleural effusions, small to moderate and greater on the left. Mild compressive atelectasis greater on the left. No superior mediastinal lymphadenopathy. Other neck: Coarsely calcified benign appearing left thyroid nodule. Bulky 14 mm right submandibular gland sialolith. Otherwise negative. Aortic arch: Pulmonary artery dominant contrast bolus timing. Visible central pulmonary arteries are patent. Mild aortic arch calcified atherosclerosis. No great vessel origin stenosis. Right carotid system: Mild right CCA plaque without stenosis. Minimal right carotid bifurcation plaque. Negative cervical right ICA. Left carotid system: Mild left CCA plaque without stenosis. Mild soft and calcified plaque at the left carotid bifurcation including the ICA origin and bulb without stenosis. Otherwise negative cervical left ICA. Vertebral arteries: No proximal  right subclavian artery stenosis. Calcified plaque at the right vertebral artery origin with mild to moderate stenosis. Obscured proximal right V2 segment due to hardware streak artifact. The visible right V 2 segment is patent but non dominant. No definite additional right vertebral artery stenosis to the skullbase. No proximal left subclavian artery stenosis despite soft and calcified plaque. Bulky calcified plaque at the left vertebral artery origin with moderate to severe stenosis (series 9, image 175). Dominant left vertebral artery. The proximal left V2 segment is obscured similar to that on the right. The from the visible left V2 segment the left vertebral artery is patent to the skullbase without stenosis. CTA HEAD Posterior circulation: Moderate to severe stenosis of the distal right vertebral artery as it crosses the dura is suspected (series 9, image 144), but the right V4 segment is patent. Bulky right V4 calcified plaque with severe stenosis just proximal to the vertebrobasilar junction (series 9, image 129). Bulky distal left vertebral artery calcified plaque also with moderate to severe stenosis proximal to the vertebrobasilar junction. The distal left vertebral artery is mildly dominant. Neither PICA is definitely identified. Patent vertebrobasilar junction and basilar artery. Basilar artery irregularity without stenosis. Irregular but patent bilateral SCA and PCA origins. Mild to moderate bilateral PCA origins stenosis. Bilateral PCA branches are within normal limits. Anterior circulation: Both ICA  siphons are patent, but there is severe siphon calcified plaque worse on the right where severe distal cavernous and proximal supraclinoid stenosis occurs (series 8 image 114. Moderate to severe left ICA siphon stenosis in the same segments. Despite this both carotid termini are patent. Left ACA A1 segment appears dominant and is patent. Anterior communicating artery is patent. However, the right ACA A2  segment is occluded just beyond its origin. The left ACA remains patent. Left MCA M1 segment and bifurcation are patent. No left MCA branch occlusion identified. No M1 stenosis. Right MCA M1 segment and bifurcation are patent. No definite right MCA branch occlusion although the anterior right MCA division is attenuated. Venous sinuses: Patent on the delayed images. Anatomic variants: Dominant left vertebral artery. Delayed phase: Stable appearance of hypodensity in the inferior right cerebellum since the CT yesterday. Patchy and confluent bilateral cerebral white matter hypodensity. Heterogeneity in the basal ganglia worse on the right. No new cortically based infarct identified. No midline shift, mass effect, or evidence of intracranial mass lesion. No acute intracranial hemorrhage identified. No ventriculomegaly. No abnormal enhancement identified. Review of the MIP images confirms the above findings IMPRESSION: 1. No emergent large vessel occlusion. 2. Age indeterminate but probably chronic (See #7) right ACA occlusion beginning at the A2 segment. 3. Mild extracranial but severe carotid siphon atherosclerosis and stenosis. Bulky calcified plaque results in severe right and moderate to severe left ICA stenosis at the distal cavernous and/or supraclinoid segments. Both carotid termini remain patent. 4. Anterior division right MCA branches appear attenuated, but no discrete branch occlusion or proximal MCA stenosis is identified. 5. Severe posterior circulation atherosclerosis with severe distal right vertebral artery stenosis in the posterior fossa, and moderate to severe stenosis of the dominant left vertebral artery at both its origin and distal segment. 6. Up to moderate stenosis at both PCA origins. 7. Stable CT appearance of the brain since yesterday. Unchanged appearance of the right PICA territory infarct which is age indeterminate. And no other acute cortically based infarct has developed. 8. Small to  moderate left greater than right layering pleural effusions. 9. Widespread chronic cervical spine ankylosis and postoperative arthrodesis at the cervicothoracic junction. Electronically Signed   By: Genevie Ann M.D.   On: 10/15/2016 11:37   Ct Head Wo Contrast  Result Date: 10/14/2016 CLINICAL DATA:  Altered mental status EXAM: CT HEAD WITHOUT CONTRAST TECHNIQUE: Contiguous axial images were obtained from the base of the skull through the vertex without intravenous contrast. COMPARISON:  October 18, 2011 FINDINGS: Brain: There is moderate diffuse atrophy. There is a focal area of decreased attenuation in the inferior, posterior aspect of the right cerebellum, likely a recent infarct involving a portion of the right posterior inferior cerebral artery distribution. No other evidence of recent/acute infarct. There is patchy small vessel disease throughout the centra semiovale bilaterally. There is evidence of prior infarct involving a portion of the head of the caudate nucleus on the right and anterior limb of the right internal capsule. There is no well-defined mass, hemorrhage, extra-axial fluid collection, or midline shift. Vascular: There is no appreciable hyperdense vessel. There is calcification in each carotid siphon region. There is also calcification in each distal vertebral artery as well as in the right carotid artery in the petrous region. Skull: Bones are osteoporotic.  Bony calvarium appears intact. Sinuses/Orbits: There is opacification of multiple ethmoid air cells the right. There is mucosal thickening throughout multiple ethmoid air cells as well. There is mild mucosal thickening inferior  left maxillary antrum anteriorly. There is mucosal thickening in a portion of the left sphenoid sinus. Orbits appear symmetric bilaterally. Other: Mastoid air cells are clear. IMPRESSION: Findings felt to represent an acute/recent infarct in the distribution of a portion of the right posterior inferior cerebellar artery.  There is atrophy with extensive supratentorial small vessel disease. Prior infarct noted involving the head of the caudate nucleus on the right as well as a portion of the anterior limb of the right internal capsule. No hemorrhage.  No extra-axial fluid. Extensive arteriovascular calcification. Foci of paranasal sinus disease, most pronounced in the right ethmoid air cell complex. Electronically Signed   By: Lowella Grip III M.D.   On: 10/14/2016 14:48   Ct Angio Neck W Or Wo Contrast  Result Date: 10/15/2016 CLINICAL DATA:  83 year old male with confusion, altered mental status. Age indeterminate Right PICA territory infarct on head CT yesterday. EXAM: CT ANGIOGRAPHY HEAD AND NECK TECHNIQUE: Multidetector CT imaging of the head and neck was performed using the standard protocol during bolus administration of intravenous contrast. Multiplanar CT image reconstructions and MIPs were obtained to evaluate the vascular anatomy. Carotid stenosis measurements (when applicable) are obtained utilizing NASCET criteria, using the distal internal carotid diameter as the denominator. CONTRAST:  50 mL Isovue 370 COMPARISON:  Head CT without contrast 10/14/2016. Cervical spine MRI 11/06/2011. 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 Posterior fusion hardware at C7-T1 with evidence of posterior element ankylosis at that level. Bulky anterior endplate osteophytosis without ankylosis at C2-C3. Bulky partially calcified ligamentous hypertrophy about the odontoid. No acute osseous abnormality identified. Upper chest: Layering bilateral pleural effusions, small to moderate and greater on the left. Mild compressive atelectasis greater on the left. No superior mediastinal lymphadenopathy. Other neck: Coarsely calcified benign appearing left thyroid nodule. Bulky 14 mm right submandibular gland sialolith. Otherwise negative. Aortic arch: Pulmonary  artery dominant contrast bolus timing. Visible central pulmonary arteries are patent. Mild aortic arch calcified atherosclerosis. No great vessel origin stenosis. Right carotid system: Mild right CCA plaque without stenosis. Minimal right carotid bifurcation plaque. Negative cervical right ICA. Left carotid system: Mild left CCA plaque without stenosis. Mild soft and calcified plaque at the left carotid bifurcation including the ICA origin and bulb without stenosis. Otherwise negative cervical left ICA. Vertebral arteries: No proximal right subclavian artery stenosis. Calcified plaque at the right vertebral artery origin with mild to moderate stenosis. Obscured proximal right V2 segment due to hardware streak artifact. The visible right V 2 segment is patent but non dominant. No definite additional right vertebral artery stenosis to the skullbase. No proximal left subclavian artery stenosis despite soft and calcified plaque. Bulky calcified plaque at the left vertebral artery origin with moderate to severe stenosis (series 9, image 175). Dominant left vertebral artery. The proximal left V2 segment is obscured similar to that on the right. The from the visible left V2 segment the left vertebral artery is patent to the skullbase without stenosis. CTA HEAD Posterior circulation: Moderate to severe stenosis of the distal right vertebral artery as it crosses the dura is suspected (series 9, image 144), but the right V4 segment is patent. Bulky right V4 calcified plaque with severe stenosis just proximal to the vertebrobasilar junction (series 9, image 129). Bulky distal left vertebral artery calcified plaque also with moderate to severe stenosis proximal to the vertebrobasilar junction. The distal left vertebral artery is mildly dominant. Neither PICA is definitely identified. Patent vertebrobasilar junction  and basilar artery. Basilar artery irregularity without stenosis. Irregular but patent bilateral SCA and PCA  origins. Mild to moderate bilateral PCA origins stenosis. Bilateral PCA branches are within normal limits. Anterior circulation: Both ICA siphons are patent, but there is severe siphon calcified plaque worse on the right where severe distal cavernous and proximal supraclinoid stenosis occurs (series 8 image 114. Moderate to severe left ICA siphon stenosis in the same segments. Despite this both carotid termini are patent. Left ACA A1 segment appears dominant and is patent. Anterior communicating artery is patent. However, the right ACA A2 segment is occluded just beyond its origin. The left ACA remains patent. Left MCA M1 segment and bifurcation are patent. No left MCA branch occlusion identified. No M1 stenosis. Right MCA M1 segment and bifurcation are patent. No definite right MCA branch occlusion although the anterior right MCA division is attenuated. Venous sinuses: Patent on the delayed images. Anatomic variants: Dominant left vertebral artery. Delayed phase: Stable appearance of hypodensity in the inferior right cerebellum since the CT yesterday. Patchy and confluent bilateral cerebral white matter hypodensity. Heterogeneity in the basal ganglia worse on the right. No new cortically based infarct identified. No midline shift, mass effect, or evidence of intracranial mass lesion. No acute intracranial hemorrhage identified. No ventriculomegaly. No abnormal enhancement identified. Review of the MIP images confirms the above findings IMPRESSION: 1. No emergent large vessel occlusion. 2. Age indeterminate but probably chronic (See #7) right ACA occlusion beginning at the A2 segment. 3. Mild extracranial but severe carotid siphon atherosclerosis and stenosis. Bulky calcified plaque results in severe right and moderate to severe left ICA stenosis at the distal cavernous and/or supraclinoid segments. Both carotid termini remain patent. 4. Anterior division right MCA branches appear attenuated, but no discrete branch  occlusion or proximal MCA stenosis is identified. 5. Severe posterior circulation atherosclerosis with severe distal right vertebral artery stenosis in the posterior fossa, and moderate to severe stenosis of the dominant left vertebral artery at both its origin and distal segment. 6. Up to moderate stenosis at both PCA origins. 7. Stable CT appearance of the brain since yesterday. Unchanged appearance of the right PICA territory infarct which is age indeterminate. And no other acute cortically based infarct has developed. 8. Small to moderate left greater than right layering pleural effusions. 9. Widespread chronic cervical spine ankylosis and postoperative arthrodesis at the cervicothoracic junction. Electronically Signed   By: Genevie Ann M.D.   On: 10/15/2016 11:37   Dg Chest Portable 1 View  Result Date: 10/14/2016 CLINICAL DATA:  Acute stroke with shortness of breath. EXAM: PORTABLE CHEST 1 VIEW COMPARISON:  11/15/2015 FINDINGS: 1543 hours. The cardio pericardial silhouette is enlarged. Insert basilar atelectasis. No airspace pulmonary edema or focal lung consolidation. No substantial pleural effusion. Left permanent pacemaker again noted. Degenerative changes evident and right shoulder. Telemetry leads overlie the chest. IMPRESSION: Cardiomegaly with basilar atelectasis. Electronically Signed   By: Misty Stanley M.D.   On: 10/14/2016 15:48      Subjective: Seen this morning. Pleasantly confused. Oriented only to self and partly to place "[place]". Follows simple instructions. Can tell me his daughter and son-in-law's name. As per RN, no acute issues reported.  Discharge Exam:  Vitals:   10/17/16 1747 10/17/16 2155 10/18/16 0047 10/18/16 0554  BP: 115/71 123/70 136/76 (!) 127/54  Pulse: 75 72 77 75  Resp: 16 16 16 16   Temp: 98.3 F (36.8 C) 97.4 F (36.3 C) 97.4 F (36.3 C) 97.5 F (36.4 C)  TempSrc: Axillary Oral Oral Oral  SpO2: 100% 100% 99% 95%  Weight:      Height:         General exam: Pleasant elderly male sitting up comfortably in bed. Respiratory system: Clear to auscultation. Respiratory effort normal. Cardiovascular system: S1 & S2 heard, RRR. No JVD, murmurs, rubs, gallops or clicks. No pedal edema. Telemetry: AV paced rhythm. Gastrointestinal system: Abdomen is nondistended, soft and nontender. No organomegaly or masses felt. Normal bowel sounds heard. Central nervous system: Alert and orientation as indicated above. No focal neurological deficits. Extremities: Symmetric 5 x 5 power. Skin: No rashes, lesions or ulcers Psychiatry: Judgement and insight impaired. Mood & affect pleasantly confused.    The results of significant diagnostics from this hospitalization (including imaging, microbiology, ancillary and laboratory) are listed below for reference.     Microbiology: Recent Results (from the past 240 hour(s))  Culture, blood (routine x 2)     Status: None (Preliminary result)   Collection Time: 10/14/16  2:00 PM  Result Value Ref Range Status   Specimen Description BLOOD LEFT HAND  Final   Special Requests IN PEDIATRIC BOTTLE Blood Culture adequate volume  Final   Culture NO GROWTH 3 DAYS  Final   Report Status PENDING  Incomplete  Culture, blood (routine x 2)     Status: Abnormal   Collection Time: 10/14/16  2:13 PM  Result Value Ref Range Status   Specimen Description BLOOD LEFT WRIST  Final   Special Requests IN PEDIATRIC BOTTLE Blood Culture adequate volume  Final   Culture  Setup Time   Final    GRAM POSITIVE COCCI IN CLUSTERS IN PEDIATRIC BOTTLE CRITICAL RESULT CALLED TO, READ BACK BY AND VERIFIED WITH: A JOHNSTON,PHARMD AT 1053 10/15/16 BY L BENFIELD    Culture (A)  Final    STAPHYLOCOCCUS SPECIES (COAGULASE NEGATIVE) THE SIGNIFICANCE OF ISOLATING THIS ORGANISM FROM A SINGLE SET OF BLOOD CULTURES WHEN MULTIPLE SETS ARE DRAWN IS UNCERTAIN. PLEASE NOTIFY THE MICROBIOLOGY DEPARTMENT WITHIN ONE WEEK IF SPECIATION AND SENSITIVITIES  ARE REQUIRED.    Report Status 10/16/2016 FINAL  Final  Blood Culture ID Panel (Reflexed)     Status: Abnormal   Collection Time: 10/14/16  2:13 PM  Result Value Ref Range Status   Enterococcus species NOT DETECTED NOT DETECTED Final   Listeria monocytogenes NOT DETECTED NOT DETECTED Final   Staphylococcus species DETECTED (A) NOT DETECTED Final    Comment: Methicillin (oxacillin) susceptible coagulase negative staphylococcus. Possible blood culture contaminant (unless isolated from more than one blood culture draw or clinical case suggests pathogenicity). No antibiotic treatment is indicated for blood  culture contaminants. CRITICAL RESULT CALLED TO, READ BACK BY AND VERIFIED WITH: A JOHNSTON,PHARMD AT 6301 10/15/16 BY L BENFIELD    Staphylococcus aureus NOT DETECTED NOT DETECTED Final   Methicillin resistance NOT DETECTED NOT DETECTED Final   Streptococcus species NOT DETECTED NOT DETECTED Final   Streptococcus agalactiae NOT DETECTED NOT DETECTED Final   Streptococcus pneumoniae NOT DETECTED NOT DETECTED Final   Streptococcus pyogenes NOT DETECTED NOT DETECTED Final   Acinetobacter baumannii NOT DETECTED NOT DETECTED Final   Enterobacteriaceae species NOT DETECTED NOT DETECTED Final   Enterobacter cloacae complex NOT DETECTED NOT DETECTED Final   Escherichia coli NOT DETECTED NOT DETECTED Final   Klebsiella oxytoca NOT DETECTED NOT DETECTED Final   Klebsiella pneumoniae NOT DETECTED NOT DETECTED Final   Proteus species NOT DETECTED NOT DETECTED Final   Serratia marcescens NOT DETECTED NOT DETECTED Final  Haemophilus influenzae NOT DETECTED NOT DETECTED Final   Neisseria meningitidis NOT DETECTED NOT DETECTED Final   Pseudomonas aeruginosa NOT DETECTED NOT DETECTED Final   Candida albicans NOT DETECTED NOT DETECTED Final   Candida glabrata NOT DETECTED NOT DETECTED Final   Candida krusei NOT DETECTED NOT DETECTED Final   Candida parapsilosis NOT DETECTED NOT DETECTED Final    Candida tropicalis NOT DETECTED NOT DETECTED Final     Labs: CBC:  Recent Labs Lab 10/14/16 1351 10/14/16 1444  WBC 8.8  --   NEUTROABS 7.5  --   HGB 14.3 16.7  HCT 42.4 49.0  MCV 94.0  --   PLT 182  --    Basic Metabolic Panel:  Recent Labs Lab 10/14/16 1351 10/14/16 1444 10/18/16 0744  NA 139 141 136  K 4.6 4.5 4.7  CL 103 106 105  CO2 20*  --  21*  GLUCOSE 176* 178* 142*  BUN 18 20 28*  CREATININE 1.32* 1.10 1.20  CALCIUM 9.4  --  9.4   Liver Function Tests:  Recent Labs Lab 10/14/16 1351  AST 25  ALT 16*  ALKPHOS 110  BILITOT 1.5*  PROT 6.9  ALBUMIN 3.8   CBG:  Recent Labs Lab 10/17/16 1112 10/17/16 1633 10/17/16 2305 10/18/16 0647 10/18/16 1149  GLUCAP 239* 263* 178* 153* 192*   Urinalysis    Component Value Date/Time   COLORURINE YELLOW 10/14/2016 1357   APPEARANCEUR HAZY (A) 10/14/2016 1357   LABSPEC 1.018 10/14/2016 1357   PHURINE 5.0 10/14/2016 1357   GLUCOSEU NEGATIVE 10/14/2016 1357   GLUCOSEU NEGATIVE 10/15/2014 0828   HGBUR SMALL (A) 10/14/2016 1357   BILIRUBINUR NEGATIVE 10/14/2016 1357   BILIRUBINUR negative 10/16/2015 0857   KETONESUR 20 (A) 10/14/2016 1357   PROTEINUR >=300 (A) 10/14/2016 1357   UROBILINOGEN 0.2 10/16/2015 0857   UROBILINOGEN 1.0 10/15/2014 0828   NITRITE NEGATIVE 10/14/2016 1357   LEUKOCYTESUR NEGATIVE 10/14/2016 1357    Discussed in detail with patient's daughter via phone. Updated care and answered questions. She is the primary caregiver for the patient.  Time coordinating discharge: Over 30 minutes  SIGNED:  Vernell Leep, MD, FACP, [place]. [facility] Pager [phone]  If 7PM-7AM, please contact night-coverage [email or web address] Password [facility] 10/18/2016, 2:29 PM

## 2016-10-18 NOTE — Progress Notes (Signed)
Hypoglycemic Event  CBG: 49  Treatment: 15 GM gel  Symptoms: None  Follow-up CBG: Time:1830 CBG Result:94  Possible Reasons for Event: Unknown  Comments/MD notified:Pam Love, PA    Marinus Maw, Shameek Nyquist J

## 2016-10-18 NOTE — Progress Notes (Signed)
Jamse Arn, MD Physician Signed Physical Medicine and Rehabilitation  Consult Note Date of Service: 10/18/2016 5:58 AM  Related encounter: ED to Hosp-Admission (Discharged) from 10/14/2016 in [place] All Collapse All   [] Hide copied text [] Hover for attribution information      Physical Medicine and Rehabilitation Consult Reason for Consult: Altered mental status with decreased functional mobility Referring Physician: [facility]   HPI: [name] is a 83 y.o. right handed male with history of diastolic congestive heart failure, hypertension, CKD stage III, diabetes mellitus, CAD with pacemaker maintained on aspirin therapy as well as underlying unspecified dementia seen by Dr. Carles Collet of neurology services 10/02/2015. Per chart review patient lives with family. Independent with assistive device prior to admission and driving short distances. One level home with 3 steps to entry. Presented 10/14/2016 with altered mental status reported by his daughter. Also noted there was a deer tick found on patient's left thigh outer medial aspect. Cranial CT reviewed, showing acute right cerebellar infarct.  Per report, acute recent infarct in the distribution of a portion of the right posterior inferior cerebellar artery. There was atrophy with extensive supratentorial small vessel disease. Prior infarct noted involving the head of the caudate nucleolus on the right as well as portion of the anterior limb of the right internal capsule. Chest x-ray cardiomegaly with basilar atelectasis. RMSF serology negative maintained on doxycycline. Urine negative nitrite. UDS negative. Troponin elevated 0.16. CTA angiogram of head and neck showed no emergent large vessel occlusion. Echocardiogram with ejection fraction of 35% no wall motion abnormalities. Neurology consulted workup ongoing for acute encephalopathy/CVA. Subcutaneous Lovenox for DVT prophylaxis. Physical  therapy evaluation completed with recommendations of physical medicine rehabilitation consult.  Review of Systems  Unable to perform ROS: Acuity of condition       Past Medical History:  Diagnosis Date  . Cancer ([place])    hx of prostate ca  . Cataracts, bilateral    hx of  . CHF (congestive heart failure) ([place])   . Diabetes mellitus   . ED (erectile dysfunction)   . Hyperlipidemia   . Hypertension   . Neuromuscular disorder ([facility])    numbness in hand/cervical issues  . Pneumonia    hx of  . Syncope    hx of  . Urination frequency         Past Surgical History:  Procedure Laterality Date  . CARDIAC CATHETERIZATION N/A 11/13/2015   Procedure: Temporary Pacemaker;  Surgeon: Lorretta Harp, MD;  Location: [facility];  Service: Cardiovascular;  Laterality: N/A;  . EP IMPLANTABLE DEVICE N/A 11/14/2015   Procedure: BiV Pacemaker Insertion CRT-P;  Surgeon: Deboraha Sprang, MD;  Location: [place] [facility];  Service: Cardiovascular;  Laterality: N/A;  . EYE SURGERY     cataract surgery bilateral  . HERNIA REPAIR    . POSTERIOR CERVICAL FUSION/FORAMINOTOMY  11/18/2011   Procedure: POSTERIOR CERVICAL FUSION/FORAMINOTOMY LEVEL 1;  Surgeon: Eustace Moore, MD;  Location: [facility];  Service: Neurosurgery;  Laterality: Bilateral;  . PROSTATE SURGERY     s/p ca  . SMALL INTESTINE SURGERY     hx of  . TONSILLECTOMY          Family History  Problem Relation Age of Onset  . Diabetes Mother   . Hypertension Father    Social History:  reports that he has never smoked. He has never used smokeless tobacco. He reports  that he does not drink alcohol or use drugs. Allergies: No Known Allergies       Medications Prior to Admission  Medication Sig Dispense Refill  . aspirin 81 MG chewable tablet Chew 81 mg by mouth daily.    . Cholecalciferol (VITAMIN D3 PO) Take 1 capsule by mouth daily. 170mcg    . furosemide (LASIX) 20 MG tablet Take 1  tablet (20 mg total) by mouth daily. 90 tablet 3  . insulin aspart (NOVOLOG) 100 UNIT/ML injection Inject 5 Units into the skin 3 (three) times daily before meals.    . insulin glargine (LANTUS) 100 UNIT/ML injection Inject 0.2 mLs (20 Units total) into the skin at bedtime. (Patient taking differently: Inject 34 Units into the skin at bedtime. ) 10 mL 11  . metFORMIN (GLUCOPHAGE-XR) 500 MG 24 hr tablet Take 2 tablets my mouth daily with supper 180 tablet 2  . nitroGLYCERIN (NITROSTAT) 0.4 MG SL tablet Place 1 tablet (0.4 mg total) under the tongue every 5 (five) minutes as needed for chest pain. 25 tablet 3  . NOVOLOG 100 UNIT/ML injection INJECT 5 UNITS INTO THE SKIN 3 TIMES DAILY WITH MEALS 20 mL 0  . ONETOUCH VERIO test strip USE TO TEST 4 TIMES DAILY 200 each 2  . vitamin B-12 (CYANOCOBALAMIN) 1000 MCG tablet Take 500 mcg by mouth daily.      Home: Home Living Family/patient expects to be discharged to:: Private residence Living Arrangements: Children Available Help at Discharge: Family, Available PRN/intermittently Type of Home: House Home Access: Stairs to enter [employer] of Steps: 3 Entrance Stairs-Rails: Right, Left, Can reach both Home Layout: One level Bathroom Shower/Tub: Multimedia programmer: Standard Bathroom Accessibility: Yes Home Equipment: Cane - quad, Hand held shower head, Grab bars - tub/shower, Shower seat Additional Comments: Information based on prior admission as pt is poor historian  Lives With: Family  Functional History: Prior Function Level of Independence: Independent with assistive device(s) Comments: used cane, drove himself, daughter took care of home management Functional Status:  Mobility: Bed Mobility Overal bed mobility: Needs Assistance Bed Mobility: Supine to Sit, Sit to Supine Supine to sit: Max assist Sit to supine: Mod assist General bed mobility comments: up in the chair on arrival.  Not ready to  return. Transfers Overall transfer level: Needs assistance Equipment used: Rolling walker (2 wheeled) Transfers: Sit to/from Stand Sit to Stand: Max assist (2 person assist not available ) General transfer comment: pt required maximal multimodal cuing to prepare to stand including for scoot to edge of chair.  Feet had to be blocked and pt assist to come significantly further than he wished to come.  Pt needed both forward and boost assist best managed by 2 persons.  Pt unable to get his trunk up over his BOS/feet..  pt likely would have done better with face to face assist and not with RW today Ambulation/Gait General Gait Details: pt unable  ADL: ADL Overall ADL's : Needs assistance/impaired General ADL Comments: Pt needing min A for UB self care and max A for LB self care at this time. Pt very lethargic throughout the session.   Cognition: Cognition Overall Cognitive Status: Impaired/Different from baseline Arousal/Alertness: Awake/alert Orientation Level: Oriented to person, Disoriented to place, Disoriented to time, Disoriented to situation Attention: Sustained Sustained Attention: Impaired Sustained Attention Impairment: Verbal basic, Functional basic Memory: Impaired Memory Impairment: Storage deficit, Decreased recall of new information, Decreased short term memory Decreased Short Term Memory: Verbal basic, Functional basic Awareness:  Impaired Awareness Impairment: Intellectual impairment, Emergent impairment Problem Solving: Impaired Problem Solving Impairment: Verbal basic, Functional basic Behaviors: Perseveration Safety/Judgment: Impaired Cognition Arousal/Alertness: Lethargic Behavior During Therapy: Flat affect Overall Cognitive Status: Impaired/Different from baseline Area of Impairment: Safety/judgement, Following commands, Orientation, Problem solving, Awareness Orientation Level: Disoriented to, Place, Time, Situation Following Commands: Follows one step  commands consistently, Follows one step commands with increased time Safety/Judgement: Decreased awareness of safety, Decreased awareness of deficits Awareness: Intellectual Problem Solving: Slow processing, Decreased initiation, Difficulty sequencing, Requires verbal cues, Requires tactile cues  Blood pressure (!) 127/54, pulse 75, temperature 97.5 F (36.4 C), temperature source Oral, resp. rate 16, height 5\' 10"  (1.778 m), weight 75 kg (165 lb 5.5 oz), SpO2 95 %. Physical Exam  Vitals reviewed. Constitutional: He appears well-developed.  Frail  HENT:  Head: Normocephalic and atraumatic.  Eyes: EOM are normal. Right eye exhibits no discharge. Left eye exhibits no discharge.  Neck: Normal range of motion. Neck supple. No thyromegaly present.  Cardiovascular:  Cardiac rate controlled  Respiratory: Effort normal and breath sounds normal. No respiratory distress.  GI: Soft. Bowel sounds are normal. He exhibits no distension.  Musculoskeletal: He exhibits no edema or tenderness.  Neurological:  Extremely somnolent, only briefly opening eyes Inconsistently following commands Appears to have left neglect DTRs 3+ RUE/RLE  Skin: Skin is warm and dry.  Psychiatric:  Unable to assess due to mentation    Lab Results Last 24 Hours       Results for orders placed or performed during the hospital encounter of 10/14/16 (from the past 24 hour(s))  Glucose, capillary     Status: Abnormal   Collection Time: 10/17/16 11:12 AM  Result Value Ref Range   Glucose-Capillary 239 (H) 65 - 99 mg/dL   Comment 1 Notify RN    Comment 2 Document in Chart   Glucose, capillary     Status: Abnormal   Collection Time: 10/17/16  4:33 PM  Result Value Ref Range   Glucose-Capillary 263 (H) 65 - 99 mg/dL   Comment 1 Notify RN    Comment 2 Document in Chart   Glucose, capillary     Status: Abnormal   Collection Time: 10/17/16 11:05 PM  Result Value Ref Range   Glucose-Capillary 178 (H) 65 -  99 mg/dL   Comment 1 Notify RN    Comment 2 Document in Chart      Imaging Results (Last 48 hours)  No results found.    Assessment/Plan: Diagnosis: Acute recent infarct in the distribution of a portion of the right posterior inferior cerebellar artery Labs and images independently reviewed.  Records reviewed and summated above. Stroke: Continue secondary stroke prophylaxis and Risk Factor Modification listed below:   Antiplatelet therapy:   Blood Pressure Management:  Continue current medication with prn's with permisive HTN per primary team Statin Agent:   Diabetes management ?Right sided hemiparesis: fit for orthosis to prevent contractures (resting hand splint for day, wrist cock up splint at night, PRAFO, etc) Motor recovery: Fluoxetine  1. Does the need for close, 24 hr/day medical supervision in concert with the patient's rehab needs make it unreasonable for this patient to be served in a less intensive setting? Yes 2. Co-Morbidities requiring supervision/potential complications: diastolic congestive heart failure (monitor for signs/symptoms of fluid overload), HTN (monitor and provide prns in accordance with increased physical exertion and pain), CKD stage III (avoid nephrotoxic meds), diabetes mellitus (Monitor in accordance with exercise and adjust meds as necessary), CAD with pacemaker (cont  meds), dementia, systolic CHF (Monitor in accordance with increased physical activity and avoid UE resistance excercises) 3. Due to safety, disease management, medication administration and patient education, does the patient require 24 hr/day rehab nursing? Yes 4. Does the patient require coordinated care of a physician, rehab nurse, PT (1-2 hrs/day, 5 days/week), OT (1-2 hrs/day, 5 days/week) and SLP (1-2 hrs/day, 5 days/week) to address physical and functional deficits in the context of the above medical diagnosis(es)? Yes Addressing deficits in the following areas: balance,  endurance, locomotion, strength, transferring, bathing, dressing, toileting, cognition and psychosocial support 5. Can the patient actively participate in an intensive therapy program of at least 3 hrs of therapy per day at least 5 days per week? Potentially 6. The potential for patient to make measurable gains while on inpatient rehab is excellent 7. Anticipated functional outcomes upon discharge from inpatient rehab are min assist and mod assist  with PT, min assist and mod assist with OT, min assist with SLP. 8. Estimated rehab length of stay to reach the above functional goals is: 18-22 days. 9. Anticipated D/C setting: TBD 10. Anticipated post D/C treatments: HH therapy and Home excercise program 11. Overall Rehab/Functional Prognosis: good  RECOMMENDATIONS: This patient's condition is appropriate for continued rehabilitative care in the following setting: Likely CIR within 1-2 days when patient more alert and able to tolerate 3 hours therapy/day. Patient has agreed to participate in recommended program. Potentially Note that insurance prior authorization may be required for reimbursement for recommended care.  Comment: Rehab Admissions Coordinator to follow up.  Delice Lesch, MD, Mellody Drown Cathlyn Parsons., PA-C 10/18/2016

## 2016-10-18 NOTE — Progress Notes (Signed)
Inpatient Rehabilitation  I met with the patient at the bedside to begin discussions about the recommendation for IP Rehab.  [facility]  I then phoned his daughter, Kalin Kyler,  to discuss CIR.  Pt. had been on CIR in May of 2017 and she desires for pt. to return for his rehab recovery.  She understands that pt will need a higher level of care upon return home.   I discussed with Dr, Algis Liming and he gives medical clearance for admission today.  I have reviewed all necessary admission paperwork with pt's daughter by phone.  She gives her verbal consent and will sign paperwork later today upon her arrival.  I have updated pt's RN Quillian Quince and Jacqualin Combes of the plan.  Please call if questions.  [facility] Admissions Coordinator Cell [phone] Office [phone]

## 2016-10-18 NOTE — Consult Note (Signed)
Physical Medicine and Rehabilitation Consult Reason for Consult: Altered mental status with decreased functional mobility Referring Physician: [facility]   HPI: [name] is a 83 y.o. right handed male with history of diastolic congestive heart failure, hypertension, CKD stage III, diabetes mellitus, CAD with pacemaker maintained on aspirin therapy as well as underlying unspecified dementia seen by Dr. Carles Collet of neurology services 10/02/2015. Per chart review patient lives with family. Independent with assistive device prior to admission and driving short distances. One level home with 3 steps to entry. Presented 10/14/2016 with altered mental status reported by his daughter. Also noted there was a deer tick found on patient's left thigh outer medial aspect. Cranial CT reviewed, showing acute right cerebellar infarct.  Per report, acute recent infarct in the distribution of a portion of the right posterior inferior cerebellar artery. There was atrophy with extensive supratentorial small vessel disease. Prior infarct noted involving the head of the caudate nucleolus on the right as well as portion of the anterior limb of the right internal capsule. Chest x-ray cardiomegaly with basilar atelectasis. RMSF serology negative maintained on doxycycline. Urine negative nitrite. UDS negative. Troponin elevated 0.16. CTA angiogram of head and neck showed no emergent large vessel occlusion. Echocardiogram with ejection fraction of 35% no wall motion abnormalities. Neurology consulted workup ongoing for acute encephalopathy/CVA. Subcutaneous Lovenox for DVT prophylaxis. Physical therapy evaluation completed with recommendations of physical medicine rehabilitation consult.  Review of Systems  Unable to perform ROS: Acuity of condition   Past Medical History:  Diagnosis Date  . Cancer ([place])    hx of prostate ca  . Cataracts, bilateral    hx of  . CHF (congestive heart failure) ([place])   . Diabetes mellitus     . ED (erectile dysfunction)   . Hyperlipidemia   . Hypertension   . Neuromuscular disorder ([facility])    numbness in hand/cervical issues  . Pneumonia    hx of  . Syncope    hx of  . Urination frequency    Past Surgical History:  Procedure Laterality Date  . CARDIAC CATHETERIZATION N/A 11/13/2015   Procedure: Temporary Pacemaker;  Surgeon: Lorretta Harp, MD;  Location: [facility];  Service: Cardiovascular;  Laterality: N/A;  . EP IMPLANTABLE DEVICE N/A 11/14/2015   Procedure: BiV Pacemaker Insertion CRT-P;  Surgeon: Deboraha Sprang, MD;  Location: [facility];  Service: Cardiovascular;  Laterality: N/A;  . EYE SURGERY     cataract surgery bilateral  . HERNIA REPAIR    . POSTERIOR CERVICAL FUSION/FORAMINOTOMY  11/18/2011   Procedure: POSTERIOR CERVICAL FUSION/FORAMINOTOMY LEVEL 1;  Surgeon: Eustace Moore, MD;  Location: [facility];  Service: Neurosurgery;  Laterality: Bilateral;  . PROSTATE SURGERY     s/p ca  . SMALL INTESTINE SURGERY     hx of  . TONSILLECTOMY     Family History  Problem Relation Age of Onset  . Diabetes Mother   . Hypertension Father    Social History:  reports that he has never smoked. He has never used smokeless tobacco. He reports that he does not drink alcohol or use drugs. Allergies: No Known Allergies Medications Prior to Admission  Medication Sig Dispense Refill  . aspirin 81 MG chewable tablet Chew 81 mg by mouth daily.    . Cholecalciferol (VITAMIN D3 PO) Take 1 capsule by mouth daily. 119mcg    . furosemide (LASIX) 20 MG tablet Take 1 tablet (20 mg total) by mouth  daily. 90 tablet 3  . insulin aspart (NOVOLOG) 100 UNIT/ML injection Inject 5 Units into the skin 3 (three) times daily before meals.    . insulin glargine (LANTUS) 100 UNIT/ML injection Inject 0.2 mLs (20 Units total) into the skin at bedtime. (Patient taking differently: Inject 34 Units into the skin at bedtime. ) 10 mL 11  . metFORMIN (GLUCOPHAGE-XR) 500 MG 24 hr tablet  Take 2 tablets my mouth daily with supper 180 tablet 2  . nitroGLYCERIN (NITROSTAT) 0.4 MG SL tablet Place 1 tablet (0.4 mg total) under the tongue every 5 (five) minutes as needed for chest pain. 25 tablet 3  . NOVOLOG 100 UNIT/ML injection INJECT 5 UNITS INTO THE SKIN 3 TIMES DAILY WITH MEALS 20 mL 0  . ONETOUCH VERIO test strip USE TO TEST 4 TIMES DAILY 200 each 2  . vitamin B-12 (CYANOCOBALAMIN) 1000 MCG tablet Take 500 mcg by mouth daily.      Home: Home Living Family/patient expects to be discharged to:: Private residence Living Arrangements: Children Available Help at Discharge: Family, Available PRN/intermittently Type of Home: House Home Access: Stairs to enter [employer] of Steps: 3 Entrance Stairs-Rails: Right, Left, Can reach both Home Layout: One level Bathroom Shower/Tub: Multimedia programmer: Standard Bathroom Accessibility: Yes Home Equipment: Cane - quad, Hand held shower head, Grab bars - tub/shower, Shower seat Additional Comments: Information based on prior admission as pt is poor historian  Lives With: Family  Functional History: Prior Function Level of Independence: Independent with assistive device(s) Comments: used cane, drove himself, daughter took care of home management Functional Status:  Mobility: Bed Mobility Overal bed mobility: Needs Assistance Bed Mobility: Supine to Sit, Sit to Supine Supine to sit: Max assist Sit to supine: Mod assist General bed mobility comments: up in the chair on arrival.  Not ready to return. Transfers Overall transfer level: Needs assistance Equipment used: Rolling walker (2 wheeled) Transfers: Sit to/from Stand Sit to Stand: Max assist (2 person assist not available ) General transfer comment: pt required maximal multimodal cuing to prepare to stand including for scoot to edge of chair.  Feet had to be blocked and pt assist to come significantly further than he wished to come.  Pt needed both  forward and boost assist best managed by 2 persons.  Pt unable to get his trunk up over his BOS/feet..  pt likely would have done better with face to face assist and not with RW today Ambulation/Gait General Gait Details: pt unable    ADL: ADL Overall ADL's : Needs assistance/impaired General ADL Comments: Pt needing min A for UB self care and max A for LB self care at this time. Pt very lethargic throughout the session.   Cognition: Cognition Overall Cognitive Status: Impaired/Different from baseline Arousal/Alertness: Awake/alert Orientation Level: Oriented to person, Disoriented to place, Disoriented to time, Disoriented to situation Attention: Sustained Sustained Attention: Impaired Sustained Attention Impairment: Verbal basic, Functional basic Memory: Impaired Memory Impairment: Storage deficit, Decreased recall of new information, Decreased short term memory Decreased Short Term Memory: Verbal basic, Functional basic Awareness: Impaired Awareness Impairment: Intellectual impairment, Emergent impairment Problem Solving: Impaired Problem Solving Impairment: Verbal basic, Functional basic Behaviors: Perseveration Safety/Judgment: Impaired Cognition Arousal/Alertness: Lethargic Behavior During Therapy: Flat affect Overall Cognitive Status: Impaired/Different from baseline Area of Impairment: Safety/judgement, Following commands, Orientation, Problem solving, Awareness Orientation Level: Disoriented to, Place, Time, Situation Following Commands: Follows one step commands consistently, Follows one step commands with increased time Safety/Judgement: Decreased awareness of safety,  Decreased awareness of deficits Awareness: Intellectual Problem Solving: Slow processing, Decreased initiation, Difficulty sequencing, Requires verbal cues, Requires tactile cues  Blood pressure (!) 127/54, pulse 75, temperature 97.5 F (36.4 C), temperature source Oral, resp. rate 16, height 5\' 10"   (1.778 m), weight 75 kg (165 lb 5.5 oz), SpO2 95 %. Physical Exam  Vitals reviewed. Constitutional: He appears well-developed.  Frail  HENT:  Head: Normocephalic and atraumatic.  Eyes: EOM are normal. Right eye exhibits no discharge. Left eye exhibits no discharge.  Neck: Normal range of motion. Neck supple. No thyromegaly present.  Cardiovascular:  Cardiac rate controlled  Respiratory: Effort normal and breath sounds normal. No respiratory distress.  GI: Soft. Bowel sounds are normal. He exhibits no distension.  Musculoskeletal: He exhibits no edema or tenderness.  Neurological:  Extremely somnolent, only briefly opening eyes Inconsistently following commands Appears to have left neglect DTRs 3+ RUE/RLE  Skin: Skin is warm and dry.  Psychiatric:  Unable to assess due to mentation    Results for orders placed or performed during the hospital encounter of 10/14/16 (from the past 24 hour(s))  Glucose, capillary     Status: Abnormal   Collection Time: 10/17/16 11:12 AM  Result Value Ref Range   Glucose-Capillary 239 (H) 65 - 99 mg/dL   Comment 1 Notify RN    Comment 2 Document in Chart   Glucose, capillary     Status: Abnormal   Collection Time: 10/17/16  4:33 PM  Result Value Ref Range   Glucose-Capillary 263 (H) 65 - 99 mg/dL   Comment 1 Notify RN    Comment 2 Document in Chart   Glucose, capillary     Status: Abnormal   Collection Time: 10/17/16 11:05 PM  Result Value Ref Range   Glucose-Capillary 178 (H) 65 - 99 mg/dL   Comment 1 Notify RN    Comment 2 Document in Chart    No results found.  Assessment/Plan: Diagnosis: Acute recent infarct in the distribution of a portion of the right posterior inferior cerebellar artery Labs and images independently reviewed.  Records reviewed and summated above. Stroke: Continue secondary stroke prophylaxis and Risk Factor Modification listed below:   Antiplatelet therapy:   Blood Pressure Management:  Continue current  medication with prn's with permisive HTN per primary team Statin Agent:   Diabetes management ?Right sided hemiparesis: fit for orthosis to prevent contractures (resting hand splint for day, wrist cock up splint at night, PRAFO, etc) Motor recovery: Fluoxetine  1. Does the need for close, 24 hr/day medical supervision in concert with the patient's rehab needs make it unreasonable for this patient to be served in a less intensive setting? Yes 2. Co-Morbidities requiring supervision/potential complications: diastolic congestive heart failure (monitor for signs/symptoms of fluid overload), HTN (monitor and provide prns in accordance with increased physical exertion and pain), CKD stage III (avoid nephrotoxic meds), diabetes mellitus (Monitor in accordance with exercise and adjust meds as necessary), CAD with pacemaker (cont meds), dementia, systolic CHF (Monitor in accordance with increased physical activity and avoid UE resistance excercises) 3. Due to safety, disease management, medication administration and patient education, does the patient require 24 hr/day rehab nursing? Yes 4. Does the patient require coordinated care of a physician, rehab nurse, PT (1-2 hrs/day, 5 days/week), OT (1-2 hrs/day, 5 days/week) and SLP (1-2 hrs/day, 5 days/week) to address physical and functional deficits in the context of the above medical diagnosis(es)? Yes Addressing deficits in the following areas: balance, endurance, locomotion, strength, transferring, bathing, dressing,  toileting, cognition and psychosocial support 5. Can the patient actively participate in an intensive therapy program of at least 3 hrs of therapy per day at least 5 days per week? Potentially 6. The potential for patient to make measurable gains while on inpatient rehab is excellent 7. Anticipated functional outcomes upon discharge from inpatient rehab are min assist and mod assist  with PT, min assist and mod assist with OT, min assist with  SLP. 8. Estimated rehab length of stay to reach the above functional goals is: 18-22 days. 9. Anticipated D/C setting: TBD 10. Anticipated post D/C treatments: HH therapy and Home excercise program 11. Overall Rehab/Functional Prognosis: good  RECOMMENDATIONS: This patient's condition is appropriate for continued rehabilitative care in the following setting: Likely CIR within 1-2 days when patient more alert and able to tolerate 3 hours therapy/day. Patient has agreed to participate in recommended program. Potentially Note that insurance prior authorization may be required for reimbursement for recommended care.  Comment: Rehab Admissions Coordinator to follow up.  Delice Lesch, MD, Mellody Drown Cathlyn Parsons., PA-C 10/18/2016

## 2016-10-18 NOTE — Progress Notes (Signed)
CSW alerted that patient will be admitting to inpatient rehab. CSW no longer needed to facilitate discharge.   CSW signing off.  Laveda Abbe LCSW [phone]

## 2016-10-18 NOTE — Care Management Note (Signed)
Case Management Note  Patient Details  Name: [name] MRN: [id number] Date of Birth: [birth year]  Subjective/Objective:                    Action/Plan: Pt discharging to CIR today. No further needs per CM.   Expected Discharge Date:  10/19/16               Expected Discharge Plan:  IP Rehab Facility  In-House Referral:  Clinical Social Work  Discharge planning Services  CM Consult  Post Acute Care Choice:    Choice offered to:     DME Arranged:    DME Agency:     HH Arranged:    [place] Agency:     Status of Service:  Completed, signed off  If discussed at [employer], dates discussed:    Additional Comments:  Pollie Friar, RN 10/18/2016, 1:44 PM

## 2016-10-18 NOTE — H&P (Signed)
Physical Medicine and Rehabilitation Admission H&P    Chief Complaint  Patient presents with  . Altered Mental Status  : HPI: [name] is a 84 y.o. right handed male with history of Chronic systolic congestive heart failure, hypertension, CKD stage III, diabetes mellitus, CAD with pacemaker maintained on aspirin therapy as well as underlying unspecified dementia seen by Dr. Carles Collet of neurology services 10/02/2015.Patient did receive inpatient rehabilitation service in July 2017 for debilitation secondary to complete heart block that required pacemaker and was discharged to home supervision level. Per chart review patient lives with family. Independent with assistive device prior to admission and driving short distances. One level home with 3 steps to entry. Presented 10/14/2016 with altered mental status reported by his daughter. Also noted there was a deer tick found on patient's left thigh outer medial aspect. Cranial CT reviewed, showing acute right cerebellar infarct.  Per report, acute recent infarct in the distribution of a portion of the right posterior inferior cerebellar artery. There was atrophy with extensive supratentorial small vessel disease. Prior infarct noted involving the head of the caudate nucleolus on the right as well as portion of the anterior limb of the right internal capsule. Chest x-ray cardiomegaly with basilar atelectasis. RMSF serology negative maintained on doxycycline. Urine negative nitrite. UDS negative. Troponin elevated 0.16. CTA angiogram of head and neck showed no emergent large vessel occlusion. Echocardiogram with ejection fraction of 35% no wall motion abnormalities. Neurology consulted workup ongoing for acute encephalopathy/CVA. Subcutaneous Lovenox for DVT prophylaxis. Physical and occupational  therapy evaluations completed with recommendations of physical medicine rehabilitation consult.Admitted for comprehensive rehabilitation program  Review of Systems   Unable to perform ROS: Dementia   Past Medical History:  Diagnosis Date  . Cancer ([place])    hx of prostate ca  . Cataracts, bilateral    hx of  . CHF (congestive heart failure) ([place])   . Diabetes mellitus   . ED (erectile dysfunction)   . Hyperlipidemia   . Hypertension   . Neuromuscular disorder ([facility])    numbness in hand/cervical issues  . Pneumonia    hx of  . Syncope    hx of  . Urination frequency    Past Surgical History:  Procedure Laterality Date  . CARDIAC CATHETERIZATION N/A 11/13/2015   Procedure: Temporary Pacemaker;  Surgeon: Lorretta Harp, MD;  Location: [facility];  Service: Cardiovascular;  Laterality: N/A;  . EP IMPLANTABLE DEVICE N/A 11/14/2015   Procedure: BiV Pacemaker Insertion CRT-P;  Surgeon: Deboraha Sprang, MD;  Location: [facility];  Service: Cardiovascular;  Laterality: N/A;  . EYE SURGERY     cataract surgery bilateral  . HERNIA REPAIR    . POSTERIOR CERVICAL FUSION/FORAMINOTOMY  11/18/2011   Procedure: POSTERIOR CERVICAL FUSION/FORAMINOTOMY LEVEL 1;  Surgeon: Eustace Moore, MD;  Location: [facility];  Service: Neurosurgery;  Laterality: Bilateral;  . PROSTATE SURGERY     s/p ca  . SMALL INTESTINE SURGERY     hx of  . TONSILLECTOMY     Family History  Problem Relation Age of Onset  . Diabetes Mother   . Hypertension Father    Social History:  reports that he has never smoked. He has never used smokeless tobacco. He reports that he does not drink alcohol or use drugs. Allergies: No Known Allergies Medications Prior to Admission  Medication Sig Dispense Refill  . aspirin 81 MG chewable tablet Chew 81 mg by mouth daily.    Marland Kitchen  Cholecalciferol (VITAMIN D3 PO) Take 1 capsule by mouth daily. 163mg    . furosemide (LASIX) 20 MG tablet Take 1 tablet (20 mg total) by mouth daily. 90 tablet 3  . insulin aspart (NOVOLOG) 100 UNIT/ML injection Inject 5 Units into the skin 3 (three) times daily before meals.    . insulin glargine  (LANTUS) 100 UNIT/ML injection Inject 0.2 mLs (20 Units total) into the skin at bedtime. (Patient taking differently: Inject 34 Units into the skin at bedtime. ) 10 mL 11  . metFORMIN (GLUCOPHAGE-XR) 500 MG 24 hr tablet Take 2 tablets my mouth daily with supper 180 tablet 2  . nitroGLYCERIN (NITROSTAT) 0.4 MG SL tablet Place 1 tablet (0.4 mg total) under the tongue every 5 (five) minutes as needed for chest pain. 25 tablet 3  . NOVOLOG 100 UNIT/ML injection INJECT 5 UNITS INTO THE SKIN 3 TIMES DAILY WITH MEALS 20 mL 0  . ONETOUCH VERIO test strip USE TO TEST 4 TIMES DAILY 200 each 2  . vitamin B-12 (CYANOCOBALAMIN) 1000 MCG tablet Take 500 mcg by mouth daily.      Home: Home Living Family/patient expects to be discharged to:: Private residence Living Arrangements: Children Available Help at Discharge: Family, Available PRN/intermittently Type of Home: House Home Access: Stairs to enter [employer] Steps: 3 Entrance Stairs-Rails: Right, Left, Can reach both Home Layout: One level Bathroom Shower/Tub: WMultimedia programmer Standard Bathroom Accessibility: Yes Home Equipment: Cane - quad, Hand held shower head, Grab bars - tub/shower, Shower seat Additional Comments: Information based on prior admission as pt is poor historian  Lives With: Family   Functional History: Prior Function Level of Independence: Independent with assistive device(s) Comments: used cane, drove himself, daughter took care of home management  Functional Status:  Mobility: Bed Mobility Overal bed mobility: Needs Assistance Bed Mobility: Supine to Sit, Sit to Supine Supine to sit: Max assist Sit to supine: Mod assist General bed mobility comments: Max A to return to upright midline position. Positioned with pillows L trunk to maintain upright posutre in bed Transfers Overall transfer level: Needs assistance Equipment used: Rolling walker (2 wheeled) Transfers: Sit to/from Stand Sit  to Stand: Max assist (2 person assist not available ) General transfer comment: pt required maximal multimodal cuing to prepare to stand including for scoot to edge of chair.  Feet had to be blocked and pt assist to come significantly further than he wished to come.  Pt needed both forward and boost assist best managed by 2 persons.  Pt unable to get his trunk up over his BOS/feet..  pt likely would have done better with face to face assist and not with RW today Ambulation/Gait General Gait Details: pt unable    ADL: ADL Overall ADL's : Needs assistance/impaired General ADL Comments: focus of session on self feeding. Pt required set up and mod redirectional cues to complete tasks. Initially leaning to L with mod amounts of food in bed without awareness. Max A to position upright in bed. Unjable to problem solve how to pull self upright in bed  Cognition: Cognition Overall Cognitive Status: Impaired/Different from baseline Arousal/Alertness: Awake/alert Orientation Level: Oriented to person, Disoriented to place, Disoriented to time, Disoriented to situation Attention: Sustained Sustained Attention: Impaired Sustained Attention Impairment: Verbal basic, Functional basic Memory: Impaired Memory Impairment: Storage deficit, Decreased recall of new information, Decreased short term memory Decreased Short Term Memory: Verbal basic, Functional basic Awareness: Impaired Awareness Impairment: Intellectual impairment, Emergent impairment Problem Solving: Impaired  Problem Solving Impairment: Verbal basic, Functional basic Behaviors: Perseveration Safety/Judgment: Impaired Cognition Arousal/Alertness: Awake/alert Behavior During Therapy: WFL for tasks assessed/performed Overall Cognitive Status: Impaired/Different from baseline Area of Impairment: Orientation, Attention, Memory, Safety/judgement, Awareness, Problem solving Orientation Level: Disoriented to, Place, Time, Situation Current  Attention Level: Sustained Memory: Decreased short-term memory Following Commands: Follows one step commands with increased time Safety/Judgement: Decreased awareness of safety, Decreased awareness of deficits Awareness: Intellectual Problem Solving: Slow processing, Difficulty sequencing, Requires verbal cues General Comments: [facility] he was at [facility]; not aware of having a sroke; seeing people in his room/hallucinations  Physical Exam: Blood pressure (!) 127/54, pulse 75, temperature 97.5 F (36.4 C), temperature source Oral, resp. rate 16, height _0  (1.778 m), weight 75 kg (165 lb 5.5 oz), SpO2 95 %. Physical Exam  Vitals reviewed. Constitutional: He appears well-developed and well-nourished.  HENT:  Head: Normocephalic and atraumatic.  Eyes: EOM are normal. Right eye exhibits no discharge. Left eye exhibits no discharge.  Neck: Normal range of motion. Neck supple. No thyromegaly present.  Cardiovascular:  Cardiac rate controlled  Respiratory: Effort normal and breath sounds normal. No respiratory distress.  GI: Soft. Bowel sounds are normal. He exhibits no distension.  Musculoskeletal: He exhibits no edema or tenderness.  Neurological: He is alert.  Patient is awake and pleasantly confused.  Easily distracted.  Following commands.  Finger-to-nose is slow but without dysmetria .  Motor: 4-4+/5 throughout DTRs 3+ RUE/RLE.   Skin: Skin is warm and dry.  Psychiatric:  Pleasantly confused     Results for orders placed or performed during the hospital encounter of 10/14/16 (from the past 48 hour(s))  Glucose, capillary     Status: Abnormal   Collection Time: 10/16/16 11:31 AM  Result Value Ref Range   Glucose-Capillary 341 (H) 65 - 99 mg/dL  Glucose, capillary     Status: Abnormal   Collection Time: 10/16/16  4:34 PM  Result Value Ref Range   Glucose-Capillary 333 (H) 65 - 99 mg/dL  Glucose, capillary     Status: None   Collection Time: 10/16/16  9:38 PM  Result  Value Ref Range   Glucose-Capillary 92 65 - 99 mg/dL   Comment 1 Notify RN    Comment 2 Document in Chart   Glucose, capillary     Status: Abnormal   Collection Time: 10/17/16 11:12 AM  Result Value Ref Range   Glucose-Capillary 239 (H) 65 - 99 mg/dL   Comment 1 Notify RN    Comment 2 Document in Chart   Glucose, capillary     Status: Abnormal   Collection Time: 10/17/16  4:33 PM  Result Value Ref Range   Glucose-Capillary 263 (H) 65 - 99 mg/dL   Comment 1 Notify RN    Comment 2 Document in Chart   Glucose, capillary     Status: Abnormal   Collection Time: 10/17/16 11:05 PM  Result Value Ref Range   Glucose-Capillary 178 (H) 65 - 99 mg/dL   Comment 1 Notify RN    Comment 2 Document in Chart   Glucose, capillary     Status: Abnormal   Collection Time: 10/18/16  6:47 AM  Result Value Ref Range   Glucose-Capillary 153 (H) 65 - 99 mg/dL   Comment 1 Notify RN    Comment 2 Document in Chart   Basic metabolic panel     Status: Abnormal   Collection Time: 10/18/16  7:44 AM  Result Value Ref Range   Sodium 136 135 -  145 mmol/L   Potassium 4.7 3.5 - 5.1 mmol/L   Chloride 105 101 - 111 mmol/L   CO2 21 (L) 22 - 32 mmol/L   Glucose, Bld 142 (H) 65 - 99 mg/dL   BUN 28 (H) 6 - 20 mg/dL   Creatinine, Ser 1.20 0.61 - 1.24 mg/dL   Calcium 9.4 8.9 - 10.3 mg/dL   GFR calc non Af Amer 54 (L) >60 mL/min   GFR calc Af Amer >60 >60 mL/min    Comment: (NOTE) The eGFR has been calculated using the CKD EPI equation. This calculation has not been validated in all clinical situations. eGFR's persistently <60 mL/min signify possible Chronic Kidney Disease.    Anion gap 10 5 - 15   No results found.     Medical Problem List and Plan: 1.  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 Will discuss baseline cognition with family 2.  DVT  Prophylaxis/Anticoagulation: Subcutaneous Lovenox. Monitor platelet counts of any signs of bleeding 3. Pain Management: Tylenol as needed 4. Mood: Provide emotional support 5. Neuropsych: This patient Is not capable of making decisions on his own behalf. 6. Skin/Wound Care: Routine skin checks 7. Fluids/Electrolytes/Nutrition: Routine I&O's with follow-up chemistries 8. Diabetes mellitus with peripheral neuropathy. Hemoglobin A1c 7.5. NovoLog 3 units 3 times a day with meals, Lantus insulin 20 units daily, Glucophage-XR 500 mg daily. Check blood sugars before meals and at bedtime. Diabetic teaching 9. CKD stage III. Follow-up chemistries 10. CAD with pacemaker. Continue aspirin. No chest pain or shortness of breath 11. Chronic systolic congestive heart failure. Monitor for any signs of fluid overload 12. Hypertension. No current antihypertensive medications. Close monitoring with with mobility 13. Tick exposure.RMSF negative. Complete course of doxycycline   Post Admission Physician Evaluation: 1. Preadmission assessment reviewed and changes made below. 2. Functional deficits secondary  to acute recent infarct distribution of a portion of the right posterior inferior cerebellar artery with underlying dementia acute encephalopathy. 3. Patient is admitted to receive collaborative, interdisciplinary care between the physiatrist, rehab nursing staff, and therapy team. 4. Patient's level of medical complexity and substantial therapy needs in context of that medical necessity cannot be provided at a lesser intensity of care such as a SNF. 5. Patient has experienced substantial functional loss from his/her baseline which was documented above under the "Functional History" and "Functional Status" headings.  Judging by the patient's diagnosis, physical exam, and functional history, the patient has potential for functional progress which will result in measurable gains while on inpatient rehab.  These gains  will be of substantial and practical use upon discharge  in facilitating mobility and self-care at the household level. 6. Physiatrist will provide 24 hour management of medical needs as well as oversight of the therapy plan/treatment and provide guidance as appropriate regarding the interaction of the two. 7. 24 hour rehab nursing will assist with safety, disease management, medication administration and patient education and help integrate therapy concepts, techniques,education, etc. 8. PT will assess and treat for/with: Lower extremity strength, range of motion, stamina, balance, functional mobility, safety, adaptive techniques and equipment,  coping skills, pain control, stroke education.   Goals are: Min A. 9. OT will assess and treat for/with: ADL's, functional mobility, safety, upper extremity strength, adaptive techniques and equipment, ego support, and community reintegration.   Goals are: Min A. Therapy may proceed with showering this patient. 10. SLP will assess and  treat for/with: cognition.  Goals are: Min A. 11. Case Management and Social Worker will assess and treat for psychological issues and discharge planning. 12. Team conference will be held weekly to assess progress toward goals and to determine barriers to discharge. 13. Patient will receive at least 3 hours of therapy per day at least 5 days per week. 14. ELOS: 15-19 days. 15. Prognosis:  good  Delice Lesch, MD, Mellody Drown Cathlyn Parsons., PA-C 10/18/2016

## 2016-10-19 ENCOUNTER — Inpatient Hospital Stay (HOSPITAL_COMMUNITY): Payer: Medicare Other

## 2016-10-19 ENCOUNTER — Inpatient Hospital Stay (HOSPITAL_COMMUNITY): Payer: Medicare Other | Admitting: Occupational Therapy

## 2016-10-19 ENCOUNTER — Inpatient Hospital Stay (HOSPITAL_COMMUNITY): Payer: Medicare Other | Admitting: Speech Pathology

## 2016-10-19 DIAGNOSIS — E1142 Type 2 diabetes mellitus with diabetic polyneuropathy: Secondary | ICD-10-CM

## 2016-10-19 DIAGNOSIS — N183 Chronic kidney disease, stage 3 (moderate): Secondary | ICD-10-CM

## 2016-10-19 DIAGNOSIS — G9340 Encephalopathy, unspecified: Secondary | ICD-10-CM

## 2016-10-19 LAB — CULTURE, BLOOD (ROUTINE X 2)
Culture: NO GROWTH
Special Requests: ADEQUATE

## 2016-10-19 LAB — CBC WITH DIFFERENTIAL/PLATELET
Basophils Absolute: 0 10*3/uL (ref 0.0–0.1)
Basophils Relative: 0 %
Eosinophils Absolute: 0.2 10*3/uL (ref 0.0–0.7)
Eosinophils Relative: 2 %
HEMATOCRIT: 39.2 % (ref 39.0–52.0)
HEMOGLOBIN: 13.6 g/dL (ref 13.0–17.0)
LYMPHS PCT: 10 %
Lymphs Abs: 1 10*3/uL (ref 0.7–4.0)
MCH: 32.2 pg (ref 26.0–34.0)
MCHC: 34.7 g/dL (ref 30.0–36.0)
MCV: 92.9 fL (ref 78.0–100.0)
MONO ABS: 0.9 10*3/uL (ref 0.1–1.0)
MONOS PCT: 9 %
NEUTROS ABS: 7.7 10*3/uL (ref 1.7–7.7)
Neutrophils Relative %: 79 %
Platelets: 163 10*3/uL (ref 150–400)
RBC: 4.22 MIL/uL (ref 4.22–5.81)
RDW: 14.4 % (ref 11.5–15.5)
WBC: 9.9 10*3/uL (ref 4.0–10.5)

## 2016-10-19 LAB — COMPREHENSIVE METABOLIC PANEL
ALBUMIN: 3 g/dL — AB (ref 3.5–5.0)
ALK PHOS: 91 U/L (ref 38–126)
ALT: 22 U/L (ref 17–63)
AST: 27 U/L (ref 15–41)
Anion gap: 11 (ref 5–15)
BILIRUBIN TOTAL: 1.1 mg/dL (ref 0.3–1.2)
BUN: 25 mg/dL — AB (ref 6–20)
CALCIUM: 9.4 mg/dL (ref 8.9–10.3)
CO2: 22 mmol/L (ref 22–32)
Chloride: 105 mmol/L (ref 101–111)
Creatinine, Ser: 1.03 mg/dL (ref 0.61–1.24)
GFR calc Af Amer: >60 mL/min (ref >60)
GFR calc non Af Amer: >60 mL/min (ref >60)
GLUCOSE: 44 mg/dL — AB (ref 65–99)
POTASSIUM: 3.7 mmol/L (ref 3.5–5.1)
Sodium: 138 mmol/L (ref 135–145)
TOTAL PROTEIN: 6 g/dL — AB (ref 6.5–8.1)

## 2016-10-19 LAB — GLUCOSE, CAPILLARY
GLUCOSE-CAPILLARY: 49 mg/dL — AB (ref 65–99)
GLUCOSE-CAPILLARY: 69 mg/dL (ref 65–99)
GLUCOSE-CAPILLARY: 84 mg/dL (ref 65–99)
GLUCOSE-CAPILLARY: 34 mg/dL — AB (ref 65–99)
GLUCOSE-CAPILLARY: 56 mg/dL — AB (ref 65–99)
GLUCOSE-CAPILLARY: 187 mg/dL — AB (ref 65–99)
Glucose-Capillary: 94 mg/dL (ref 65–99)
Glucose-Capillary: 47 mg/dL — ABNORMAL LOW (ref 65–99)
Glucose-Capillary: 38 mg/dL — CL (ref 65–99)
Glucose-Capillary: 84 mg/dL (ref 65–99)
Glucose-Capillary: 79 mg/dL (ref 65–99)
Glucose-Capillary: 84 mg/dL (ref 65–99)

## 2016-10-19 MED ORDER — GLUCOSE 40 % PO GEL
ORAL | Status: AC
Start: 2016-10-19 — End: 2016-10-19
  Administered 2016-10-19: 37.5 g
  Filled 2016-10-19: qty 1

## 2016-10-19 MED ORDER — INSULIN ASPART 100 UNIT/ML ~~LOC~~ SOLN
0.0000 [IU] | Freq: Three times a day (TID) | SUBCUTANEOUS | Status: DC
  Administered 2016-10-20 – 2016-10-21 (×3): 2 [IU] via SUBCUTANEOUS
  Administered 2016-10-21: 1 [IU] via SUBCUTANEOUS
  Administered 2016-10-22: 2 [IU] via SUBCUTANEOUS
  Administered 2016-10-22: 1 [IU] via SUBCUTANEOUS
  Administered 2016-10-23: 2 [IU] via SUBCUTANEOUS
  Administered 2016-10-24 – 2016-10-27 (×4): 1 [IU] via SUBCUTANEOUS
  Administered 2016-10-28: 2 [IU] via SUBCUTANEOUS
  Administered 2016-10-29 – 2016-11-02 (×6): 1 [IU] via SUBCUTANEOUS
  Administered 2016-11-02: 3 [IU] via SUBCUTANEOUS
  Administered 2016-11-02 – 2016-11-03 (×2): 2 [IU] via SUBCUTANEOUS
  Administered 2016-11-04 (×2): 1 [IU] via SUBCUTANEOUS

## 2016-10-19 MED ORDER — INSULIN ASPART 100 UNIT/ML ~~LOC~~ SOLN: 0.0000 [IU] | Freq: Every day | SUBCUTANEOUS | Status: DC

## 2016-10-19 NOTE — Evaluation (Signed)
Occupational Therapy Assessment and Plan  Patient Details  Name: [name] MRN: [id number] Date of Birth: [birth year]  OT Diagnosis: altered mental status, cognitive deficits and muscle weakness (generalized) Rehab Potential: Rehab Potential (ACUTE ONLY): Good ELOS: 18-21 days    Today's Date: 10/19/2016 OT Individual Time: 0800-0930 OT Individual Time Calculation (min): 90 min     Problem List:  Patient Active Problem List   Diagnosis Date Noted  . Acute encephalopathy 10/18/2016  . Tick bite of calf, initial encounter   . Stage 3 chronic kidney disease   . Diabetes mellitus type 2 in nonobese (HCC)   . Coronary artery disease involving native coronary artery of native heart without angina pectoris   . Dementia without behavioral disturbance   . Chronic systolic heart failure ([place])   . Debilitated 11/18/2015  . Neuropathic pain   . Tachycardia   . Pacemaker   . DM type 2 with diabetic peripheral neuropathy ([place])   . Benign essential HTN   . History of syncope   . LBBB (left bundle branch block)   . Acute blood loss anemia   . NSVT (nonsustained ventricular tachycardia) ([place])   . Bradycardia, severe sinus 11/13/2015  . Complete heart block ([place])   . Chronic combined systolic and diastolic heart failure ([place]) 04/27/2013  . Essential hypertension 04/27/2013  . Left bundle branch block 04/27/2013  . Type II or unspecified type diabetes mellitus with neurological manifestations, uncontrolled 01/15/2013    Past Medical History:  Past Medical History:  Diagnosis Date  . Cancer ([place])    hx of prostate ca  . Cataracts, bilateral    hx of  . CHF (congestive heart failure) ([place])   . Diabetes mellitus   . ED (erectile dysfunction)   . Hyperlipidemia   . Hypertension   . Neuromuscular disorder (HCC)    numbness in hand/cervical issues  . Pneumonia    hx of  . Syncope    hx of  . Urination frequency    Past Surgical History:  Past Surgical History:  Procedure  Laterality Date  . CARDIAC CATHETERIZATION N/A 11/13/2015   Procedure: Temporary Pacemaker;  Surgeon: Lorretta Harp, MD;  Location: [place] [facility];  Service: Cardiovascular;  Laterality: N/A;  . EP IMPLANTABLE DEVICE N/A 11/14/2015   Procedure: BiV Pacemaker Insertion CRT-P;  Surgeon: Deboraha Sprang, MD;  Location: [place] [facility];  Service: Cardiovascular;  Laterality: N/A;  . EYE SURGERY     cataract surgery bilateral  . HERNIA REPAIR    . POSTERIOR CERVICAL FUSION/FORAMINOTOMY  11/18/2011   Procedure: POSTERIOR CERVICAL FUSION/FORAMINOTOMY LEVEL 1;  Surgeon: Eustace Moore, MD;  Location: [facility];  Service: Neurosurgery;  Laterality: Bilateral;  . PROSTATE SURGERY     s/p ca  . SMALL INTESTINE SURGERY     hx of  . TONSILLECTOMY      Assessment & Plan Clinical Impression: Patient is a 83 y.o. year old male with history of Chronic systolic congestive heart failure, hypertension, CKD stage III, diabetes mellitus, CAD with pacemaker maintained on aspirin therapy as well as underlying unspecified dementia seen by Dr. Ralph Dowdy neurology services 10/02/2015.Patient did receive inpatient rehabilitation service in July 2017 for debilitation secondary to complete heart block that required pacemaker and was discharged to home supervision level. Per chart review patient lives with family. Independent with assistive device prior to admission and driving short distances. One level home with 3 steps to entry. Presented 10/14/2016 with altered mental status  reported by his daughter. Also noted there was a deer tickfound on patient's left thigh outer medial aspect. Cranial CT reviewed, showing acute right cerebellar infarct. Per report, acute recent infarct in the distribution of a portion of the right posterior inferior cerebellar artery. There was atrophy with extensive supratentorial small vessel disease. Prior infarct noted involving the head of the caudate nucleolus on the right as well as portion  of the anterior limb of the right internal capsule. Chest x-ray cardiomegaly with basilar atelectasis. RMSFserology negative maintained on doxycycline. Urine negative nitrite. UDS negative. Troponin elevated 0.16. CTA angiogram of head and neck showed no emergent large vessel occlusion. Echocardiogram with ejection fraction of 35% no wall motion abnormalities. Neurology consulted workup ongoing for acute encephalopathy/CVA. Subcutaneous Lovenox for DVT prophylaxis. Patient transferred to CIR on 10/18/2016 .    Patient currently requires max with basic self-care skills secondary to muscle weakness and decreased initiation, decreased attention, decreased awareness, decreased problem solving, decreased safety awareness and decreased memory.  Prior to hospitalization, patient could complete basic ADLs with modified independent .  Patient will benefit from skilled intervention to decrease level of assist with basic self-care skills and increase independence with basic self-care skills prior to discharge home with care partner.  Anticipate patient will require minimal physical assistance and follow up home health.  OT - End of Session Activity Tolerance: Tolerates 30+ min activity with multiple rests Endurance Deficit: Yes Endurance Deficit Description: Pt demonstrates increased fatigue/lethargy throughout session OT Assessment Rehab Potential (ACUTE ONLY): Good OT Patient demonstrates impairments in the following area(s): Balance;Cognition;Safety;Endurance OT Basic ADL's Functional Problem(s): Grooming;Bathing;Dressing;Toileting;Eating OT Transfers Functional Problem(s): Toilet;Tub/Shower OT Plan OT Intensity: Minimum of 1-2 x/day, 45 to 90 minutes OT Frequency: 5 out of 7 days OT Duration/Estimated Length of Stay: 18-21 days  OT Treatment/Interventions: Balance/vestibular training;Discharge planning;Self Care/advanced ADL retraining;Therapeutic Activities;UE/LE Coordination activities;Cognitive  remediation/compensation;Disease mangement/prevention;Functional mobility training;Patient/family education;Therapeutic Exercise;DME/adaptive equipment instruction;Community reintegration;Psychosocial support;UE/LE Strength taining/ROM;Wheelchair propulsion/positioning OT Self Feeding Anticipated Outcome(s): supervision OT Basic Self-Care Anticipated Outcome(s): MinA OT Toileting Anticipated Outcome(s): MinA OT Bathroom Transfers Anticipated Outcome(s): MinA OT Recommendation Patient destination: Home Follow Up Recommendations: 24 hour supervision/assistance;Other (comment) (TBD) Equipment Recommended: To be determined   Skilled Therapeutic Intervention OT eval completed with focus on discussion with Pt regarding POC, goals during stay, explanation of OT/CIR. OT treatment session completed with focus on ADLs and functional mobility transfers. Pt with decreased awareness and very easily distracted, requiring increased time and Max multimodal cues to initiate and carry out ADL tasks during session. Pt demonstrates increased fatigue throughout session, dozing off during ADL completion and requiring increased cues to arouse to complete tasks. Pt currently requires MaxA for bed mobility to achieve sitting EOB, MaxA for squat pivot transfer EOB to w/c. Pt completed bathing, dressing, and grooming ADLs from w/c level at sink. Pt perseverating on washing face during bathing, requiring verbal cues to wash additional parts of body with assist for washing back and buttocks. MinA for UB dressing, MaxA for LB dressing secondary to fatigue and difficulty attending to task; Requires MaxA for sit<>stand transfers from w/c, using sink for bil UE support during LB dressing. Ended session with Pt sitting up in w/c, PT arriving to begin session.   OT Evaluation Precautions/Restrictions  Precautions Precautions: (P) Fall;ICD/Pacemaker Restrictions Weight Bearing Restrictions: No General Chart Reviewed:  Yes Family/Caregiver Present: No   Pain Pain Assessment Pain Assessment: Faces Faces Pain Scale: No hurt Home Living/Prior Functioning Home Living Family/patient expects to be discharged to:: Private residence  Living Arrangements: Children Available Help at Discharge: (P) Family, Available PRN/intermittently (daughter) Type of Home: (P) House Home Access: (P) Stairs to enter Entrance Stairs-Number of Steps: (P) 3 Entrance Stairs-Rails: (P) Right, Left, Can reach both Home Layout: (P) One level Bathroom Shower/Tub: Multimedia programmer: Standard Bathroom Accessibility: Yes Additional Comments: (P) information obtained from chart review as Pt is poor historian   Lives With: (P) Family Prior Function Level of Independence: (P) Requires assistive device for independence, Independent with transfers, Independent with gait, Independent with basic ADLs  Able to Take Stairs?: (P) Yes Driving: (P) Yes Vocation: Retired Comments: (P) per chart review, Pt was driving short distances, using cane for functional mobility, and was living with daughter who was assisting PRN  ADL ADL ADL Comments: see functional navigator  Vision Baseline Vision/History: Wears glasses Wears Glasses: Reading only Patient Visual Report: No change from baseline Vision Assessment?: No apparent visual deficits Additional Comments: no apparent visual deficits during OT evaluation - to be further assessed  Perception  Perception: Within Functional Limits Praxis Praxis: (P) Impaired Praxis Impairment Details: (P) Motor planning Cognition Overall Cognitive Status: (P) Impaired/Different from baseline Arousal/Alertness: (P) Lethargic (initially falling asleep and eyes closed; cues to arouse) Orientation Level: Person Year: Other (Comment) (1990s) Month: February Day of Week: Incorrect Memory: (P) Impaired Memory Impairment: Storage deficit;Decreased recall of new information;Decreased short term  memory Decreased Short Term Memory: Verbal basic;Functional basic Immediate Memory Recall: Sock;Blue;Bed Memory Recall: Sock Memory Recall Sock: With Cue Attention: (P) Focused Focused Attention: (P) Appears intact Sustained Attention: (P) Impaired Sustained Attention Impairment: (P) Functional basic Awareness: (P) Impaired Awareness Impairment: (P) Intellectual impairment Problem Solving: (P) Impaired Problem Solving Impairment: (P) Functional basic Executive Function: Self Correcting Initiating: Impaired Initiating Impairment: Verbal basic;Functional basic Self Correcting: Impaired Self Correcting Impairment: Verbal basic;Functional basic Behaviors: (P) Poor frustration tolerance Safety/Judgment: (P) Impaired Comments: Max multimodal cues for redirecting to task at hand Sensation Sensation Proprioception: Impaired by gross assessment Additional Comments: difficulty assessing sensation due to cognitive deficits  Coordination Gross Motor Movements are Fluid and Coordinated: Yes Fine Motor Movements are Fluid and Coordinated: Yes (based upon functional task assessment ) Motor  Motor Motor: Abnormal postural alignment and control Motor - Skilled Clinical Observations: posterior pelvit tilt during standing; Pt unable to self correct despite multimodal cues  Mobility  Transfers Transfers: Sit to Stand;Stand to Sit Sit to Stand: 2: Max assist Sit to Stand Details: Tactile cues for initiation;Tactile cues for posture;Tactile cues for placement;Verbal cues for sequencing;Verbal cues for technique;Verbal cues for precautions/safety;Manual facilitation for placement;Manual facilitation for weight bearing;Manual facilitation for weight shifting;Verbal cues for safe use of DME/AE;Tactile cues for weight shifting;Tactile cues for sequencing Stand to Sit: 2: Max assist Stand to Sit Details (indicate cue type and reason): Tactile cues for initiation;Tactile cues for sequencing;Tactile cues  for weight shifting;Tactile cues for posture;Tactile cues for placement;Verbal cues for sequencing;Verbal cues for technique;Verbal cues for precautions/safety;Verbal cues for safe use of DME/AE;Manual facilitation for weight shifting;Manual facilitation for placement;Manual facilitation for weight bearing  Trunk/Postural Assessment  Cervical Assessment Cervical Assessment: Within Functional Limits Thoracic Assessment Thoracic Assessment: Within Functional Limits Lumbar Assessment Lumbar Assessment: Within Functional Limits Postural Control Postural Control: Deficits on evaluation Postural Limitations: posterior pelvic tilt   Balance Balance Balance Assessed: Yes Static Sitting Balance Static Sitting - Balance Support: Feet supported;Bilateral upper extremity supported Static Sitting - Level of Assistance: 3: Mod assist Static Standing Balance Static Standing - Balance Support: Bilateral upper extremity supported Static Standing -  Level of Assistance: 2: Max assist Extremity/Trunk Assessment RUE Assessment RUE Assessment: Exceptions to [facility] RUE AROM (degrees) Overall AROM Right Upper Extremity: Other (comment) RUE Strength RUE Overall Strength: Other (Comment) (generalized weakness grossly 3+/5) LUE Assessment LUE Assessment: Exceptions to [facility] LUE AROM (degrees) Overall AROM Left Upper Extremity: Other (comment) (AROM shoulder flexion 0-110, however WFL for functional tasks completed, shoulder internal/external rotation and elbow flexion/extension WFL) LUE Strength LUE Overall Strength: Other (Comment) (generalized weakness grossly 3+/5)   See Function Navigator for Current Functional Status.   Refer to Care Plan for Long Term Goals  Recommendations for other services: None    Discharge Criteria: Patient will be discharged from OT if patient refuses treatment 3 consecutive times without medical reason, if treatment goals not met, if there is a change in medical status, if  patient makes no progress towards goals or if patient is discharged from hospital.  The above assessment, treatment plan, treatment alternatives and goals were discussed and mutually agreed upon: No family available/patient unable  Raymondo Band 10/19/2016, 3:53 PM

## 2016-10-19 NOTE — Evaluation (Signed)
Speech Language Pathology Assessment and Plan  Patient Details  Name: [name] MRN: [id number] Date of Birth: 1930-12-11  SLP Diagnosis: Cognitive Impairments;Dysphagia  Rehab Potential: Fair ELOS: 14-21 days     Today's Date: 10/19/2016 SLP Individual Time: 1400-1430 SLP Individual Time Calculation (min): 30 min   Problem List:  Patient Active Problem List   Diagnosis Date Noted  . Acute encephalopathy 10/18/2016  . Tick bite of calf, initial encounter   . Stage 3 chronic kidney disease   . Diabetes mellitus type 2 in nonobese (HCC)   . Coronary artery disease involving native coronary artery of native heart without angina pectoris   . Dementia without behavioral disturbance   . Chronic systolic heart failure ([place])   . Debilitated 11/18/2015  . Neuropathic pain   . Tachycardia   . Pacemaker   . DM type 2 with diabetic peripheral neuropathy ([place])   . Benign essential HTN   . History of syncope   . LBBB (left bundle branch block)   . Acute blood loss anemia   . NSVT (nonsustained ventricular tachycardia) ([place])   . Bradycardia, severe sinus 11/13/2015  . Complete heart block ([place])   . Chronic combined systolic and diastolic heart failure ([place]) 04/27/2013  . Essential hypertension 04/27/2013  . Left bundle branch block 04/27/2013  . Type II or unspecified type diabetes mellitus with neurological manifestations, uncontrolled 01/15/2013   Past Medical History:  Past Medical History:  Diagnosis Date  . Cancer ([place])    hx of prostate ca  . Cataracts, bilateral    hx of  . CHF (congestive heart failure) ([place])   . Diabetes mellitus   . ED (erectile dysfunction)   . Hyperlipidemia   . Hypertension   . Neuromuscular disorder (HCC)    numbness in hand/cervical issues  . Pneumonia    hx of  . Syncope    hx of  . Urination frequency    Past Surgical History:  Past Surgical History:  Procedure Laterality Date  . CARDIAC CATHETERIZATION N/A 11/13/2015   Procedure:  Temporary Pacemaker;  Surgeon: Lorretta Harp, MD;  Location: [facility];  Service: Cardiovascular;  Laterality: N/A;  . EP IMPLANTABLE DEVICE N/A 11/14/2015   Procedure: BiV Pacemaker Insertion CRT-P;  Surgeon: Deboraha Sprang, MD;  Location: [place] [facility];  Service: Cardiovascular;  Laterality: N/A;  . EYE SURGERY     cataract surgery bilateral  . HERNIA REPAIR    . POSTERIOR CERVICAL FUSION/FORAMINOTOMY  11/18/2011   Procedure: POSTERIOR CERVICAL FUSION/FORAMINOTOMY LEVEL 1;  Surgeon: Eustace Moore, MD;  Location: [facility];  Service: Neurosurgery;  Laterality: Bilateral;  . PROSTATE SURGERY     s/p ca  . SMALL INTESTINE SURGERY     hx of  . TONSILLECTOMY      Assessment / Plan / Recommendation Clinical Impression   Patrick Cournoyer Brutonis a 83 y.o.right handed malewith history of Chronic systolic congestive heart failure, hypertension, CKD stage III, diabetes mellitus, CAD with pacemaker maintained on aspirin therapy as well as underlying unspecified dementia seen by Dr. Ralph Blevins neurology services 10/02/2015.Patient did receive inpatient rehabilitation service in July 2017 for debilitation secondary to complete heart block that required pacemaker and was discharged to home supervision level. Per chart review patient lives with family. Independent with assistive device prior to admission and driving short distances. Presented 10/14/2016 with altered mental status reported by his daughter. Also noted there was a deer tickfound on patient's left thigh outer  medial aspect. Cranial CT reviewed, showing acute right cerebellar infarct. Per report, acute recent infarct in the distribution of a portion of the right posterior inferior cerebellar artery. There was atrophy with extensive supratentorial small vessel disease. Prior infarct noted involving the head of the caudate nucleolus on the right as well as portion of the anterior limb of the right internal capsule. Neurology consulted workup  ongoing for acute encephalopathy/CVA.  Physical and occupational  therapy evaluations completed with recommendations of physical medicine rehabilitation consult.Admitted for comprehensive rehabilitation program.  SLP evaluation was completed on 10/19/2016 with the following results:   Pt presents with severe cognitive impairments characterized by decreased alertness and attention to tasks which impacts all higher level cognitive impairment.  Pt oriented to person only. Due to lethargy, trials of PO were limited during today's bedside swallow evaluation; however, pt was observed consuming 2 bites of graham cracker and 1 sip of water via straw without overt s/s of aspiration.  Pt remains afebrile with O2 sats WFL on room so for now recommend that pt remain on his current diet with close, full supervision for use of swallowing precautions. Diet may need to modified should lethargy persist.     Pt would benefit from skilled ST while inpatient in order to maximize functional independence and reduce burden of care prior to discharge.     Skilled Therapeutic Interventions          Cognitive-linguistic and bedside swallow evaluation completed.  See above.     SLP Assessment  Patient will need skilled Speech Lanaguage Pathology Services during CIR admission    Recommendations  SLP Diet Recommendations: Age appropriate regular solids;Thin Liquid Administration via: Cup;Straw Medication Administration: Whole meds with liquid Supervision: Patient able to self feed;Full supervision/cueing for compensatory strategies Compensations: Minimize environmental distractions;Slow rate;Small sips/bites Postural Changes and/or Swallow Maneuvers: Out of bed for meals;Seated upright 90 degrees Oral Care Recommendations: Oral care BID Recommendations for Other Services: Neuropsych consult Patient destination: Home Follow up Recommendations: Home Health SLP;Outpatient SLP;Skilled Nursing facility;24 hour  supervision/assistance Equipment Recommended: None recommended by SLP    SLP Frequency 3 to 5 out of 7 days   SLP Duration  SLP Intensity  SLP Treatment/Interventions 14-21 days   Minumum of 1-2 x/day, 30 to 90 minutes  Cognitive remediation/compensation;Cueing hierarchy;Dysphagia/aspiration precaution training;Functional tasks;Internal/external aids;Patient/family education    Pain Pain Assessment Pain Assessment: No/denies pain Faces Pain Scale: No hurt  Prior Functioning Cognitive/Linguistic Baseline: Information not available Type of Home: House  Lives With: Family Available Help at Discharge: Family;Available PRN/intermittently Vocation: Retired  Function:  Eating Eating   Modified Consistency Diet: No Eating Assist Level: Set up assist for;Supervision or verbal cues   Eating Set Up Assist For: Opening containers   Helper Brings Food to Mouth: Occasionally   Cognition Comprehension Comprehension assist level: Understands basic 25 - 49% of the time/ requires cueing 50 - 75% of the time  Expression   Expression assist level: Expresses basic 25 - 49% of the time/requires cueing 50 - 75% of the time. Uses single words/gestures.  Social Interaction Social Interaction assist level: Interacts appropriately 25 - 49% of time - Needs frequent redirection.  Problem Solving Problem solving assist level: Solves basic less than 25% of the time - needs direction nearly all the time or does not effectively solve problems and may need a restraint for safety  Memory Memory assist level: Recognizes or recalls less than 25% of the time/requires cueing greater than 75% of the time  Short Term Goals: Week 1: SLP Short Term Goal 1 (Week 1): Pt will consume regular textures and thin liquids with min assist verbal cues for use of swallowing precautions and minimal overt s/s of aspiration  SLP Short Term Goal 2 (Week 1): Pt will maintain alertness for 2 minute intervals with max assist  multimodal cues.   SLP Short Term Goal 3 (Week 1): Pt will sustain his attention to task for 1 minute intervals with max assist multimodal cues for redirection.   SLP Short Term Goal 4 (Week 1): Pt will utilize external aids to orient to place, date, and situation with max assist multimodal cues.    Refer to Care Plan for Long Term Goals  Recommendations for other services: Neuropsych  Discharge Criteria: Patient will be discharged from SLP if patient refuses treatment 3 consecutive times without medical reason, if treatment goals not met, if there is a change in medical status, if patient makes no progress towards goals or if patient is discharged from hospital.  The above assessment, treatment plan, treatment alternatives and goals were discussed and mutually agreed upon: No family available/patient unable  Emilio Math 10/19/2016, 4:04 PM

## 2016-10-19 NOTE — Progress Notes (Signed)
[facility] PHYSICAL MEDICINE & REHABILITATION     PROGRESS NOTE    Subjective/Complaints: Up in bed eating breakfast. States he slept well. Denies pain.   ROS: pt denies nausea, vomiting, diarrhea, cough, shortness of breath or chest pain   Objective: Vital Signs: Blood pressure 126/86, pulse 79, temperature 97.6 F (36.4 C), temperature source Axillary, resp. rate 18, height 5\' 10"  (1.778 m), weight 79.3 kg (174 lb 12.8 oz), SpO2 92 %. No results found.  Recent Labs  10/18/16 1839 10/19/16 0539  WBC 11.5* 9.9  HGB 13.8 13.6  HCT 40.5 39.2  PLT 183 163    Recent Labs  10/18/16 0744 10/18/16 1839 10/19/16 0539  NA 136  --  138  K 4.7  --  3.7  CL 105  --  105  GLUCOSE 142*  --  44*  BUN 28*  --  25*  CREATININE 1.20 1.17 1.03  CALCIUM 9.4  --  9.4   CBG (last 3)   Recent Labs  10/17/16 2305 10/18/16 0647 10/18/16 1149  GLUCAP 178* 153* 192*    Wt Readings from Last 3 Encounters:  10/18/16 79.3 kg (174 lb 12.8 oz)  10/16/16 75 kg (165 lb 5.5 oz)  09/03/16 79.4 kg (175 lb)    Physical Exam:  Constitutional: He appears well-developed and well-nourished.  HENT:  Head: Normocephalic and atraumatic.  Eyes: EOM are normal. Right eye exhibits no discharge. Left eye exhibits no discharge.  Neck: Normal range of motion. Neck supple. No thyromegaly present.  Cardiovascular:  RRR without murmur. No JVD Respiratory: CTA Bilaterally without wheezes or rales. Normal effort GI: Soft. Bowel sounds are normal. He exhibits no distension.  Musculoskeletal: He exhibits no edema or tenderness.  Neurological: He is alert.  Oriented to self and hospital. Easily distracted.  Following commands. Decreased [place].  Motor: 4-4+/5 throughout DTRs 3+ RUE/RLE.   Skin: Skin is warm and dry.  Psychiatric:  Pleasantly confused    Assessment/Plan: 1. Functional and cognitive deficits secondary to encephalopathy and recent right PICA infarct which require 3+ hours per day of  interdisciplinary therapy in a comprehensive inpatient rehab setting. Physiatrist is providing close team supervision and 24 hour management of active medical problems listed below. Physiatrist and rehab team continue to assess barriers to discharge/monitor patient progress toward functional and medical goals.  Function:  Bathing Bathing position      Bathing parts      Bathing assist        Upper Body Dressing/Undressing Upper body dressing                    Upper body assist        Lower Body Dressing/Undressing Lower body dressing                                  Lower body assist        Toileting Toileting          Toileting assist     Transfers Chair/bed transfer             Locomotion Ambulation           Wheelchair          Cognition Comprehension    Expression    Social Interaction    Problem Solving    Memory     Medical Problem List and Plan: 1.  Decreased functional mobility secondary  to Acute recent infarct distribution of a portion of the right posterior inferior cerebellar artery with underlying dementia acute encephalopathy.   -begin therapies today  -team conf today 2.  DVT Prophylaxis/Anticoagulation: Subcutaneous Lovenox. Monitor platelet counts of any signs of bleeding 3. Pain Management: Tylenol as needed 4. Mood: Provide emotional support 5. Neuropsych: This patient Is not capable of making decisions on his own behalf. 6. Skin/Wound Care: Routine skin checks 7. Fluids/Electrolytes/Nutrition: appears to have a good appetite  -I personally reviewed all of the patient's labs today, and lab work is improving.  8. Diabetes mellitus with peripheral neuropathy. Hemoglobin A1c 7.5. NovoLog 3 units 3 times a day with meals, Lantus insulin 20 units daily, Glucophage-XR 500 mg daily. Check blood sugars before meals and at bedtime. Diabetic teaching 9. CKD stage III.  Labs stable 10. CAD with pacemaker. Continue  aspirin. No chest pain or shortness of breath 11. Chronic systolic congestive heart failure. Monitor for any signs of fluid overload   -daily weights 12. Hypertension. No current antihypertensive medications. Close monitoring with with mobility 13. Tick exposure.RMSF negative. Complete course of doxycycline   LOS (Days) 1 A FACE TO FACE EVALUATION WAS PERFORMED  Meredith Staggers, MD 10/19/2016 8:21 AM

## 2016-10-19 NOTE — Progress Notes (Signed)
Patient choked on regular textured dinner tray.  Patient was alert at time his food was offered.  Staff was providing full supervision and intervened quickly.  Patient coughed and vomited numerous times.  Patient continues to clear his throat but is in no visible distress at this time.  Lung sounds clear but diminished in bases, same as this mornings assessment.  Algis Liming, PA notified.  New orders received to downgrade diet to dys 2 textures, check blood work in the morning, obtain chest xray tonight, and modified insulin orders to compensate for his episodes of low blood sugars.  Patients cognitive status is impaired, which is baseline, and when asked, states he is fine.  He yells out at times, and is verbally inappropriate at times as well.  Will continue to monitor patient.  Brita Romp, RN

## 2016-10-19 NOTE — Progress Notes (Signed)
Patient information reviewed and entered into eRehab system by Shreshta Medley, RN, CRRN, PPS Coordinator.  Information including medical coding and functional independence measure will be reviewed and updated through discharge.     Per nursing patient was given "Data Collection Information Summary for Patients in Inpatient Rehabilitation Facilities with attached "Privacy Act Statement-Health Care Records" upon admission.  

## 2016-10-19 NOTE — Patient Care Conference (Signed)
Inpatient RehabilitationTeam Conference and Plan of Care Update Date: 10/19/2016   Time: 2:15 PM    Patient Name: [name]      Medical Record Number: [id number]  Date of Birth: 13-Jul-1930 Sex: Male         Room/Bed: 4W10C/4W10C-01 Payor Info: Payor: MEDICARE / Plan: MEDICARE PART A AND B / Product Type: *No Product type* /    Admitting Diagnosis: CVA  Admit Date/Time:  10/18/2016  4:44 PM Admission Comments: No comment available   Primary Diagnosis:  <principal problem not specified> Principal Problem: <principal problem not specified>  Patient Active Problem List   Diagnosis Date Noted  . Acute encephalopathy 10/18/2016  . Tick bite of calf, initial encounter   . Stage 3 chronic kidney disease   . Diabetes mellitus type 2 in nonobese (HCC)   . Coronary artery disease involving native coronary artery of native heart without angina pectoris   . Dementia without behavioral disturbance   . Chronic systolic heart failure ([place])   . Debilitated 11/18/2015  . Neuropathic pain   . Tachycardia   . Pacemaker   . DM type 2 with diabetic peripheral neuropathy ([place])   . Benign essential HTN   . History of syncope   . LBBB (left bundle branch block)   . Acute blood loss anemia   . NSVT (nonsustained ventricular tachycardia) ([place])   . Bradycardia, severe sinus 11/13/2015  . Complete heart block ([place])   . Chronic combined systolic and diastolic heart failure ([place]) 04/27/2013  . Essential hypertension 04/27/2013  . Left bundle branch block 04/27/2013  . Type II or unspecified type diabetes mellitus with neurological manifestations, uncontrolled 01/15/2013    Expected Discharge Date: Expected Discharge Date:  (20-25 days)  Team Members Present: Physician leading conference: Dr. Alger Simons Social Worker Present: Lennart Pall, LCSW Nurse Present: Rayetta Pigg, RN PT Present: Kem Parkinson, PT OT Present: Roanna Epley, COTA;Stephanie Schlosser, OT SLP Present: Gunnar Fusi, SLP PPS Coordinator present : Daiva Nakayama, RN, CRRN     Current Status/Progress Goal Weekly Team Focus  Medical   pica infarct, encephalopathy, dm, ckd  improve balance and functional mobility  renal, kidneys, nutrition   Bowel/Bladder   incontinent of bowel and bladder LBM 10-16-16  Continent of bowel and bladder, regular bowel pattern  timed tolieting q 2   Swallow/Nutrition/ Hydration   Regular, textures; thin liquids: needs full supervision due to cognition   Supervision   safety with PO intake    ADL's   max A for functional transfers, max A sit<>stand, tot A for LB dressing, min A UB dressing, max verbal cues for attention to task, max A for orientaiton  min A overall  cognitive remediation, sit<>stand, standing balance, attention, functional transfers   Mobility   max to +2 assist  min/supervision overall  cognitive remediation, endurance, transfers, initiation of gait/stairs, neuro re-ed for postural control and balance   Communication   functional communication is impacted by cognition          Safety/Cognition/ Behavioral Observations  Max to total assist, very limited by lethargy and fleeting sustained attention on eval  Mod Assist   arousal, orientation, attention to basic, familiar tasks    Pain   no c/o pain (tylenol 650)  0  Assess pain q shift and prn   Skin   no skin issues   no new skin issues   turn schedule, asses skini q shift and prn    Rehab Goals Patient  on target to meet rehab goals: Yes *See Care Plan and progress notes for long and short-term goals.  Barriers to Discharge: safety awareness, balance    Possible Resolutions to Barriers:  adative equipment and education    Discharge Planning/Teaching Needs:  Plan to d/c home with daughter vs SNF if she feels she cannot meet care needs.      Team Discussion:  New eval.  BSs extremely variable - MD aware.  Total - max assist on evels and hope to reach min assist goals overall.  Est 20-25 d LOS   Revisions to Treatment Plan:  None   Continued Need for Acute Rehabilitation Level of Care: The patient requires daily medical management by a physician with specialized training in physical medicine and rehabilitation for the following conditions: Daily direction of a multidisciplinary physical rehabilitation program to ensure safe treatment while eliciting the highest outcome that is of practical value to the patient.: Yes Daily medical management of patient stability for increased activity during participation in an intensive rehabilitation regime.: Yes Daily analysis of laboratory values and/or radiology reports with any subsequent need for medication adjustment of medical intervention for : Neurological problems;Blood pressure problems  [name] 10/20/2016, 4:40 PM

## 2016-10-19 NOTE — Plan of Care (Signed)
Problem: RH BOWEL ELIMINATION Goal: RH STG MANAGE BOWEL WITH ASSISTANCE STG Manage Bowel with mod Assistance.  Outcome: Not Progressing Required total assist +2 for incontinent episode during therapy  Problem: RH BLADDER ELIMINATION Goal: RH STG MANAGE BLADDER WITH ASSISTANCE STG Manage Bladder With mod Assistance  Outcome: Not Progressing Incontinent- pulls off diaper and condom cath

## 2016-10-19 NOTE — Evaluation (Signed)
Physical Therapy Assessment and Plan  Patient Details  Name: [name] MRN: [id number] Date of Birth: 11-12-1930  PT Diagnosis: Abnormal posture, Difficulty walking, Impaired cognition, Muscle weakness and Pain in joint Rehab Potential: Good ELOS: 18-22 days   Today's Date: 10/19/2016 PT Individual Time: 0930-1040 PT Individual Time Calculation (min): 70 min    Problem List:  Patient Active Problem List   Diagnosis Date Noted  . Acute encephalopathy 10/18/2016  . Tick bite of calf, initial encounter   . Stage 3 chronic kidney disease   . Diabetes mellitus type 2 in nonobese (HCC)   . Coronary artery disease involving native coronary artery of native heart without angina pectoris   . Dementia without behavioral disturbance   . Chronic systolic heart failure ([place])   . Debilitated 11/18/2015  . Neuropathic pain   . Tachycardia   . Pacemaker   . DM type 2 with diabetic peripheral neuropathy ([place])   . Benign essential HTN   . History of syncope   . LBBB (left bundle branch block)   . Acute blood loss anemia   . NSVT (nonsustained ventricular tachycardia) ([place])   . Bradycardia, severe sinus 11/13/2015  . Complete heart block ([place])   . Chronic combined systolic and diastolic heart failure ([place]) 04/27/2013  . Essential hypertension 04/27/2013  . Left bundle branch block 04/27/2013  . Type II or unspecified type diabetes mellitus with neurological manifestations, uncontrolled 01/15/2013    Past Medical History:  Past Medical History:  Diagnosis Date  . Cancer ([place])    hx of prostate ca  . Cataracts, bilateral    hx of  . CHF (congestive heart failure) ([place])   . Diabetes mellitus   . ED (erectile dysfunction)   . Hyperlipidemia   . Hypertension   . Neuromuscular disorder (HCC)    numbness in hand/cervical issues  . Pneumonia    hx of  . Syncope    hx of  . Urination frequency    Past Surgical History:  Past Surgical History:  Procedure Laterality Date  .  CARDIAC CATHETERIZATION N/A 11/13/2015   Procedure: Temporary Pacemaker;  Surgeon: Lorretta Harp, MD;  Location: [place] [facility];  Service: Cardiovascular;  Laterality: N/A;  . EP IMPLANTABLE DEVICE N/A 11/14/2015   Procedure: BiV Pacemaker Insertion CRT-P;  Surgeon: Deboraha Sprang, MD;  Location: [place] [facility];  Service: Cardiovascular;  Laterality: N/A;  . EYE SURGERY     cataract surgery bilateral  . HERNIA REPAIR    . POSTERIOR CERVICAL FUSION/FORAMINOTOMY  11/18/2011   Procedure: POSTERIOR CERVICAL FUSION/FORAMINOTOMY LEVEL 1;  Surgeon: Eustace Moore, MD;  Location: [facility];  Service: Neurosurgery;  Laterality: Bilateral;  . PROSTATE SURGERY     s/p ca  . SMALL INTESTINE SURGERY     hx of  . TONSILLECTOMY      Assessment & Plan Clinical Impression: Patient is a 83 y.o. year old right handed malewith history of Chronic systolic congestive heart failure, hypertension, CKD stage III, diabetes mellitus, CAD with pacemaker maintained on aspirin therapy as well as underlying unspecified dementia seen by Dr. Ralph Dowdy neurology services 10/02/2015.Patient did receive inpatient rehabilitation service in July 2017 for debilitation secondary to complete heart block that required pacemaker and was discharged to home supervision level. Per chart review patient lives with family. Independent with assistive device prior to admission and driving short distances. One level home with 3 steps to entry. Presented 10/14/2016 with altered mental status reported by  his daughter. Also noted there was a deer tickfound on patient's left thigh outer medial aspect. Cranial CT reviewed, showing acute right cerebellar infarct. Per report, acute recent infarct in the distribution of a portion of the right posterior inferior cerebellar artery. There was atrophy with extensive supratentorial small vessel disease. Prior infarct noted involving the head of the caudate nucleolus on the right as well as portion of the  anterior limb of the right internal capsule. Chest x-ray cardiomegaly with basilar atelectasis. RMSFserology negative maintained on doxycycline. Urine negative nitrite. UDS negative. Troponin elevated 0.16. CTA angiogram of head and neck showed no emergent large vessel occlusion. Echocardiogram with ejection fraction of 35% no wall motion abnormalities. Neurology consulted workup ongoing for acute encephalopathy/CVA. Subcutaneous Lovenox for DVT prophylaxis. Physical and occupational  therapy evaluations completed with recommendations of physical medicine rehabilitation consult. Patient transferred to CIR on 10/18/2016 .   Patient currently requires max with mobility secondary to muscle weakness and muscle joint tightness, decreased cardiorespiratoy endurance, impaired timing and sequencing, decreased coordination and decreased motor planning, decreased initiation, decreased attention, decreased awareness, decreased problem solving, decreased safety awareness, decreased memory and delayed processing and decreased sitting balance, decreased standing balance, decreased postural control and decreased balance strategies.  Prior to hospitalization, patient was modified independent  with mobility and lived with Family in a House home.  Home access is 3Stairs to enter.  Patient will benefit from skilled PT intervention to maximize safe functional mobility, minimize fall risk and decrease caregiver burden for planned discharge home with 24 hour supervision.  Anticipate patient will benefit from follow up [place] at discharge.  PT - End of Session Endurance Deficit: Yes Endurance Deficit Description: difficulty with maintaining alertness initially PT Assessment Rehab Potential (ACUTE/IP ONLY): Good Barriers to Discharge: Decreased caregiver support PT Patient demonstrates impairments in the following area(s): Balance;Behavior;Endurance;Motor;Pain;Safety;Skin Integrity PT Transfers Functional Problem(s): Bed to  Chair;Bed Mobility;Car;Furniture PT Locomotion Functional Problem(s): Ambulation;Wheelchair Mobility;Stairs PT Plan PT Intensity: Minimum of 1-2 x/day ,45 to 90 minutes PT Frequency: 5 out of 7 days PT Duration Estimated Length of Stay: 18-22 days PT Treatment/Interventions: Ambulation/gait training;Balance/vestibular training;Cognitive remediation/compensation;Community reintegration;Disease management/prevention;Discharge planning;DME/adaptive equipment instruction;Functional mobility training;Neuromuscular re-education;Pain management;Psychosocial support;Patient/family education;Skin care/wound management;Splinting/orthotics;Stair training;Therapeutic Activities;Therapeutic Exercise;UE/LE Strength taining/ROM;UE/LE Coordination activities;Wheelchair propulsion/positioning PT Transfers Anticipated Outcome(s): min assist overall PT Locomotion Anticipated Outcome(s): min assist household gait and stairs PT Recommendation Follow Up Recommendations: Home health PT;24 hour supervision/assistance Patient destination: Home Equipment Recommended: To be determined  Skilled Therapeutic Intervention Evaluation completed (see details above and below) with education on PT POC and goals and individual treatment initiated with focus on addressing functional mobility, cognitive remediation, and addressing overall endurance and arousal. Handoff from OT with pt decreased arousal and only maintaining alertness to name at time. Engaged in transfer training with focus on initiation with pt unable to maintain attention long enough or motor plan to complete successful transfer to mat table. Pt yelling out and asking "HEY what's in the box under there??" when there was a step bell and perseverating on various items in the room. In front of stairs (trial of something more functionally automatic), pt able to stand up with rails with mod/max assist and initiate taking a step but then reports need to use bathroom urgently.  Returned to room total assist and using St[place] (max assist for sit <> stand initially) with pt already incontinent but transferred to toilet to attempt to continue BM - pt unable to maintain upright posture enough to get pads out from under  his bottom and ultimately had an accident in the bathroom. Eventually completed bowel movement and required total +2 for all clean up. Pt incontinent again of urine and demonstrating increasing frustration with staff when attempting to help patient as well as demonstrating inappropriate behavior. Attempted to re-direct and educate as much as possible. Left with nurse tech and pt in bed.    PT Evaluation Precautions/Restrictions Precautions Precautions: Fall;ICD/Pacemaker Restrictions Weight Bearing Restrictions: No Pain Intermittent groaning in discomfort but unable to identify pain location. Home Living/Prior Functioning Home Living Available Help at Discharge: Family;Available PRN/intermittently Type of Home: House Home Access: Stairs to enter [employer] of Steps: 3 Entrance Stairs-Rails: Right;Left;Can reach both Home Layout: One level Bathroom Shower/Tub: Multimedia programmer: Standard Bathroom Accessibility: Yes Additional Comments: information obtained from chart review as Pt is poor historian   Lives With: Family Prior Function Level of Independence: Requires assistive device for independence;Independent with transfers;Independent with gait;Independent with basic ADLs  Able to Take Stairs?: Yes Driving: Yes Vocation: Retired Comments: per chart review, Pt was driving short distances, using cane for functional mobility, and was living with daughter who was assisting PRN  Vision/Perception  Vision - Assessment Additional Comments: no apparent visual deficits during OT evaluation - to be further assessed  Praxis Praxis: Impaired Praxis Impairment Details: Motor planning  Cognition Overall Cognitive Status:  Impaired/Different from baseline Arousal/Alertness: Lethargic Orientation Level: Oriented to person Attention: Sustained Focused Attention: Appears intact Sustained Attention: Impaired Sustained Attention Impairment: Verbal basic;Functional basic Memory: Impaired Memory Impairment: Storage deficit Awareness: Impaired Awareness Impairment: Intellectual impairment Problem Solving: Impaired Problem Solving Impairment: Verbal basic;Functional basic Executive Function:  (all impaired due to lower level impairment ) Behaviors: Poor frustration tolerance;Perseveration Safety/Judgment: Impaired Sensation Sensation Light Touch: Appears Intact Proprioception: Impaired by gross assessment Additional Comments: cognition impacting formal assessment Coordination Gross Motor Movements are Fluid and Coordinated: No Fine Motor Movements are Fluid and Coordinated: Yes (based upon functional task assessment ) Motor  Motor Motor: Abnormal postural alignment and control;Motor apraxia Motor - Skilled Clinical Observations: posterior pelvit tilt during standing; Pt unable to self correct despite multimodal cues      Trunk/Postural Assessment  Cervical Assessment Cervical Assessment: Within Functional Limits Thoracic Assessment Thoracic Assessment: Within Functional Limits Lumbar Assessment Lumbar Assessment:  (posterior pelvic tilt) Postural Control Postural Control: Deficits on evaluation Postural Limitations: posterior lean and poor ability to demonstrate functional pelvic mobility  Balance Balance Balance Assessed: Yes Static Sitting Balance Static Sitting - Balance Support: Feet supported;Bilateral upper extremity supported Static Sitting - Level of Assistance: 4: Min assist Dynamic Sitting Balance Sitting balance - Comments: min to mod assist Static Standing Balance Static Standing - Balance Support: Bilateral upper extremity supported Static Standing - Level of Assistance: 3: Mod  assist;2: Max assist;4: Min assist (fluctuates due to posterior lean) Extremity Assessment  RUE Assessment RUE Assessment: Exceptions to [facility] RUE AROM (degrees) Overall AROM Right Upper Extremity: Other (comment) RUE Strength RUE Overall Strength: Other (Comment) (generalized weakness grossly 3+/5) LUE Assessment LUE Assessment: Exceptions to [facility] LUE AROM (degrees) Overall AROM Left Upper Extremity: Other (comment) (AROM shoulder flexion 0-110, however WFL for functional tasks completed, shoulder internal/external rotation and elbow flexion/extension WFL) LUE Strength LUE Overall Strength: Other (Comment) (generalized weakness grossly 3+/5) RLE Assessment RLE Assessment: Exceptions to [facility] RLE Strength RLE Overall Strength Comments: difficulty with formail MMT due to inabiity to follow commands/cognition - demonstrates active movement in all planes  LLE Assessment LLE Assessment: Exceptions to [facility] LLE Strength LLE Overall  Strength Comments: difficulty with formail MMT due to inabiity to follow commands/cognition - demonstrates active movement in all planes    See Function Navigator for Current Functional Status.   Refer to Care Plan for Long Term Goals  Recommendations for other services: None   Discharge Criteria: Patient will be discharged from PT if patient refuses treatment 3 consecutive times without medical reason, if treatment goals not met, if there is a change in medical status, if patient makes no progress towards goals or if patient is discharged from hospital.  The above assessment, treatment plan, treatment alternatives and goals were discussed and mutually agreed upon: by patient  Juanna Cao, PT, DPT  10/19/2016, 4:15 PM

## 2016-10-20 ENCOUNTER — Inpatient Hospital Stay (HOSPITAL_COMMUNITY): Payer: Medicare Other

## 2016-10-20 ENCOUNTER — Inpatient Hospital Stay (HOSPITAL_COMMUNITY): Payer: Medicare Other | Admitting: Physical Therapy

## 2016-10-20 ENCOUNTER — Other Ambulatory Visit: Payer: Self-pay | Admitting: Endocrinology

## 2016-10-20 ENCOUNTER — Inpatient Hospital Stay (HOSPITAL_COMMUNITY): Payer: Medicare Other | Admitting: Speech Pathology

## 2016-10-20 DIAGNOSIS — E1165 Type 2 diabetes mellitus with hyperglycemia: Secondary | ICD-10-CM

## 2016-10-20 LAB — CBC WITH DIFFERENTIAL/PLATELET
BASOS ABS: 0 10*3/uL (ref 0.0–0.1)
Basophils Relative: 0 %
EOS PCT: 5 %
Eosinophils Absolute: 0.4 10*3/uL (ref 0.0–0.7)
HEMATOCRIT: 38.2 % — AB (ref 39.0–52.0)
Hemoglobin: 13.1 g/dL (ref 13.0–17.0)
LYMPHS PCT: 19 %
Lymphs Abs: 1.5 10*3/uL (ref 0.7–4.0)
MCH: 31.6 pg (ref 26.0–34.0)
MCHC: 34.3 g/dL (ref 30.0–36.0)
MCV: 92.3 fL (ref 78.0–100.0)
Monocytes Absolute: 0.7 10*3/uL (ref 0.1–1.0)
Monocytes Relative: 9 %
NEUTROS ABS: 5.3 10*3/uL (ref 1.7–7.7)
Neutrophils Relative %: 67 %
PLATELETS: 188 10*3/uL (ref 150–400)
RBC: 4.14 MIL/uL — ABNORMAL LOW (ref 4.22–5.81)
RDW: 14.3 % (ref 11.5–15.5)
WBC: 7.9 10*3/uL (ref 4.0–10.5)

## 2016-10-20 LAB — GLUCOSE, CAPILLARY
GLUCOSE-CAPILLARY: 181 mg/dL — AB (ref 65–99)
GLUCOSE-CAPILLARY: 117 mg/dL — AB (ref 65–99)
GLUCOSE-CAPILLARY: 33 mg/dL — AB (ref 65–99)
GLUCOSE-CAPILLARY: 225 mg/dL — AB (ref 65–99)
Glucose-Capillary: 68 mg/dL (ref 65–99)
Glucose-Capillary: 61 mg/dL — ABNORMAL LOW (ref 65–99)
Glucose-Capillary: 117 mg/dL — ABNORMAL HIGH (ref 65–99)
Glucose-Capillary: 128 mg/dL — ABNORMAL HIGH (ref 65–99)

## 2016-10-20 MED ORDER — INSULIN GLARGINE 100 UNIT/ML ~~LOC~~ SOLN
10.0000 [IU] | Freq: Every day | SUBCUTANEOUS | Status: DC
  Administered 2016-10-21 – 2016-11-04 (×14): 10 [IU] via SUBCUTANEOUS
  Filled 2016-10-20 (×15): qty 0.1

## 2016-10-20 MED ORDER — TRAZODONE HCL 50 MG PO TABS
50.0000 mg | ORAL_TABLET | Freq: Every evening | ORAL | Status: DC | PRN
  Administered 2016-10-20 – 2016-10-28 (×6): 50 mg via ORAL
  Filled 2016-10-20 (×6): qty 1

## 2016-10-20 NOTE — Progress Notes (Signed)
Occupational Therapy Note  Patient Details  Name: [name] MRN: [id number] Date of Birth: August 11, 1930  Today's Date: 10/20/2016 OT Missed Time: 10 Minutes Missed Time Reason: Patient fatigue  Pt supine in bed asleep upon entering room, arousable but unable to keep eyes open or stay awake despite Max multimodal cues and efforts (sternal rub, cold washcloth, verbal cues) Pt resistive to any movement when attempting to move UEs/move LEs towards EOB. Pt left supine in bed, call bell in reach, bed alarm activated.   Raymondo Band 10/20/2016, 2:38 PM

## 2016-10-20 NOTE — Progress Notes (Signed)
Speech Language Pathology Daily Session Note  Patient Details  Name: [name] MRN: [id number] Date of Birth: [birth year]  Today's Date: 10/20/2016 SLP Individual Time: 0800-0900 SLP Individual Time Calculation (min): 60 min  Short Term Goals: Week 1: SLP Short Term Goal 1 (Week 1): Pt will consume dys 1 textures and thin liquids with min assist verbal cues for use of swallowing precautions and minimal overt s/s of aspiration  SLP Short Term Goal 1 - Progress (Week 1): Revised due to lack of progress SLP Short Term Goal 2 (Week 1): Pt will maintain alertness for 2 minute intervals with max assist multimodal cues.   SLP Short Term Goal 3 (Week 1): Pt will sustain his attention to task for 1 minute intervals with max assist multimodal cues for redirection.   SLP Short Term Goal 4 (Week 1): Pt will utilize external aids to orient to place, date, and situation with max assist multimodal cues.    Skilled Therapeutic Interventions:  Pt was seen for skilled ST targeting goals for dysphagia and cognition.  Tray was downgraded last night due to choking episode with regular textures.  Nurse tech providing supervision upon therapist's arrival and reported poor toleration of dys 2 textures due to poor attention to boluses.  Pt much more alert than yesterday's evaluation and was noted to intermittently cough on solids due to talking while eating.  Max assist multimodal cues for attention to boluses were ineffective for facilitating clearance of materials from the oral cavity to minimize coughing episodes.  As a result, recommend further downgrade to dys 1 textures.  Pt needed total assist to reorient to place and situation and was only able to sustain his attention to task for 1-2 minute intervals at a time.  Pt was left in bed with bed alarm set, handed off to primary PT.  Continue per current plan of care.       Function:  Eating Eating   Modified Consistency Diet: Yes Eating Assist Level: Set up  assist for;Supervision or verbal cues;Help with picking up utensils;Help managing cup/glass;Helper checks for pocketed food   Eating Set Up Assist For: Opening containers       Cognition Comprehension Comprehension assist level: Understands basic 25 - 49% of the time/ requires cueing 50 - 75% of the time  Expression   Expression assist level: Expresses basic 25 - 49% of the time/requires cueing 50 - 75% of the time. Uses single words/gestures.  Social Interaction Social Interaction assist level: Interacts appropriately 25 - 49% of time - Needs frequent redirection.  Problem Solving Problem solving assist level: Solves basic less than 25% of the time - needs direction nearly all the time or does not effectively solve problems and may need a restraint for safety  Memory Memory assist level: Recognizes or recalls less than 25% of the time/requires cueing greater than 75% of the time    Pain Pain Assessment Pain Assessment: No/denies pain  Therapy/Group: Individual Therapy  Kelton Bultman, Selinda Orion 10/20/2016, 9:18 AM

## 2016-10-20 NOTE — Significant Event (Signed)
Hypoglycemic Event  CBG: 60  Treatment: 15 GM carbohydrate snack  Symptoms: None  Follow-up CBG: Time:0730 CBG Result:61  Possible Reasons for Event: Unknown  Comments/MD notified:Dan Angiulli,PA    Richarda Osmond D

## 2016-10-20 NOTE — Progress Notes (Signed)
Physical Therapy Session Note  Patient Details  Name: [name] MRN: [id number] Date of Birth: [birth year]  Today's Date: 10/20/2016 PT Individual Time: 0900-1000 PT Individual Time Calculation (min): 60 min   Short Term Goals: Week 1:  PT Short Term Goal 1 (Week 1): Pt will be able to transfer with mod assist PT Short Term Goal 2 (Week 1): Pt will be able to gait x 25' with max +2 PT Short Term Goal 3 (Week 1): Pt will be able to initiate stair negotiation  Skilled Therapeutic Interventions/Progress Updates: Pt received supine in bed, asleep but easily awoken and agreeable to treatment. Initially pt falling asleep every 10-20 seconds, however with increased mobility pt remained arousable throughout session. Pt oriented to person, disoriented to place and time ([place]). Rolling R/L modA with improving initiation with repetition. R sidelying>sit with modA using bedrails and HOB flat. Static sitting balance on edge of bed variable S>maxA with posterior lean and impaired righting reactions. Pt attempted to don sock however unable to reach feet d/t posterior LOB when lifting LEs. Socks donned totalA. SIt >stand in stedy with min guard. Sit <>stand x10 reps from stedy seat with S. Transported to gym totalA in w/c. Gait with R rail in hall and min/modA; narrow BOS with pt walking heel to toe, unable to correct with cueing. Pt reports urgency to urinate; returned to room totalA. Incontinent in brief however finished urinating in urinal once in room. Sit <>stand at sink with min/modA; minA for static standing balance while second therapist performed hygiene and brief change. W/c propulsion x75' with BUE and minA, cues for technique to maintain straight trajectory. Gait with RW x57' and minA overall, occasional modA d/t LOB when pt distracted. Stairs performed x8 steps, 3" height with B handrails and modA; cues for correct lead LE d/t LLE strength/coordination deficits and  recurvatum/buckling, and cues for upright posture d/t tendency to squat while descending stairs. Returned to room totalA. Pt became argumentative when cued to get in bed, stating that was not his bed and his bed was at [facility]. Reminded pt that he was in the hospital; pt was very concerned, however therapist offered emotional support and educated pt in goals of rehab to help him get back home. Pt then agreeable to get back in bed; modA stand pivot to bed. ModA sit >supine, +2A to scoot toward HOB. Remained supine in bed at end of session, all needs in reach.      Therapy Documentation Precautions:  Precautions Precautions: Fall, ICD/Pacemaker Restrictions Weight Bearing Restrictions: No Pain: Pain Assessment Pain Assessment: No/denies pain   See Function Navigator for Current Functional Status.   Therapy/Group: Individual Therapy  Luberta Mutter 10/20/2016, 10:40 AM

## 2016-10-20 NOTE — Progress Notes (Signed)
Occupational Therapy Session Note  Patient Details  Name: [name] MRN: [id number] Date of Birth: Feb 21, 1931  Today's Date: 10/20/2016 OT Individual Time: 1100-1200 OT Individual Time Calculation (min): 60 min    Short Term Goals: Week 1:  OT Short Term Goal 1 (Week 1): Pt will complete stand pivot transfer to toilet with [place].  OT Short Term Goal 2 (Week 1): Pt will demonstrate sustained attention for completing at least 2/3 seated grooming ADL tasks. OT Short Term Goal 3 (Week 1): Pt will complete LB dressing with ModA.  OT Short Term Goal 4 (Week 1): Pt will demonstrate dynamic sitting balance with MinA during functional task completion.   Skilled Therapeutic Interventions/Progress Updates:    Pt resting in bed upon arrival.  Pt required tot A for orientation to date, place, and situation.  Pt required max verbal cues to attend to task and reorient to place.  Pt continually called out for individuals not present in room.  Pt engaged in BADL retraining including bathing/dressing with sit<>stand from w/c at sink.  Pt required max A for SPT to w/c, min A for sit<>stand at sink, and mod A for SPT back to bed.  Pt assisted with repositioning in bed.  Pt required assistance with shaving 2/2 inattention to task.  Pt required tot A for LB dressing tasks.  Pt became distracted multiple times during LB dressing tasks and was unable to complete task. Focus on activity tolerance, cognitive remediation, attention to task, sit<>stand, standing balance, functional transfers, and safety awareness to increase independence with BADLs.   Therapy Documentation Precautions:  Precautions Precautions: Fall, ICD/Pacemaker Restrictions Weight Bearing Restrictions: No   Pain: Pain Assessment Pain Assessment: No/denies pain  See Function Navigator for Current Functional Status.   Therapy/Group: Individual Therapy  Leroy Libman 10/20/2016, 12:08 PM

## 2016-10-20 NOTE — Progress Notes (Signed)
Social Work Patient ID: KAYLOB WALLEN, male   DOB: 1930/06/01, 83 y.o.   MRN: [id number]  Rexene Alberts Social Worker Signed   Patient Care Conference Date of Service: 10/19/2016  4:49 PM      Hide copied text Hover for attribution information Inpatient RehabilitationTeam Conference and Plan of Care Update Date: 10/19/2016   Time: 2:15 PM      Patient Name: [name]      Medical Record Number: [id number]  Date of Birth: 08/28/1930 Sex: Male         Room/Bed: 4W10C/4W10C-01 Payor Info: Payor: MEDICARE / Plan: MEDICARE PART A AND B / Product Type: *No Product type* /     Admitting Diagnosis: CVA  Admit Date/Time:  10/18/2016  4:44 PM Admission Comments: No comment available    Primary Diagnosis:  <principal problem not specified> Principal Problem: <principal problem not specified>       Patient Active Problem List    Diagnosis Date Noted  . Acute encephalopathy 10/18/2016  . Tick bite of calf, initial encounter    . Stage 3 chronic kidney disease    . Diabetes mellitus type 2 in nonobese (HCC)    . Coronary artery disease involving native coronary artery of native heart without angina pectoris    . Dementia without behavioral disturbance    . Chronic systolic heart failure ([place])    . Debilitated 11/18/2015  . Neuropathic pain    . Tachycardia    . Pacemaker    . DM type 2 with diabetic peripheral neuropathy ([place])    . Benign essential HTN    . History of syncope    . LBBB (left bundle branch block)    . Acute blood loss anemia    . NSVT (nonsustained ventricular tachycardia) ([place])    . Bradycardia, severe sinus 11/13/2015  . Complete heart block ([place])    . Chronic combined systolic and diastolic heart failure ([place]) 04/27/2013  . Essential hypertension 04/27/2013  . Left bundle branch block 04/27/2013  . Type II or unspecified type diabetes mellitus with neurological manifestations, uncontrolled 01/15/2013      Expected Discharge Date: Expected Discharge Date:   (20-25 days)   Team Members Present: Physician leading conference: Dr. Alger Simons Social Worker Present: Lennart Pall, LCSW Nurse Present: Rayetta Pigg, RN PT Present: Kem Parkinson, PT OT Present: Roanna Epley, COTA;Stephanie Schlosser, OT SLP Present: Gunnar Fusi, SLP PPS Coordinator present : Daiva Nakayama, RN, CRRN       Current Status/Progress Goal Weekly Team Focus  Medical     pica infarct, encephalopathy, dm, ckd  improve balance and functional mobility  renal, kidneys, nutrition   Bowel/Bladder     incontinent of bowel and bladder LBM 10-16-16  Continent of bowel and bladder, regular bowel pattern  timed tolieting q 2   Swallow/Nutrition/ Hydration     Regular, textures; thin liquids: needs full supervision due to cognition   Supervision   safety with PO intake    ADL's     max A for functional transfers, max A sit<>stand, tot A for LB dressing, min A UB dressing, max verbal cues for attention to task, max A for orientaiton  min A overall  cognitive remediation, sit<>stand, standing balance, attention, functional transfers   Mobility     max to +2 assist  min/supervision overall  cognitive remediation, endurance, transfers, initiation of gait/stairs, neuro re-ed for postural control and balance   Communication     functional communication  is impacted by cognition          Safety/Cognition/ Behavioral Observations   Max to total assist, very limited by lethargy and fleeting sustained attention on eval  Mod Assist   arousal, orientation, attention to basic, familiar tasks    Pain     no c/o pain (tylenol 650)  0  Assess pain q shift and prn   Skin     no skin issues   no new skin issues   turn schedule, asses skini q shift and prn     Rehab Goals Patient on target to meet rehab goals: Yes *See Care Plan and progress notes for long and short-term goals.   Barriers to Discharge: safety awareness, balance     Possible Resolutions to Barriers:  adative  equipment and education     Discharge Planning/Teaching Needs:  Plan to d/c home with daughter vs SNF if she feels she cannot meet care needs.      Team Discussion:  New eval.  BSs extremely variable - MD aware.  Total - max assist on evels and hope to reach min assist goals overall.  Est 20-25 d LOS  Revisions to Treatment Plan:  None    Continued Need for Acute Rehabilitation Level of Care: The patient requires daily medical management by a physician with specialized training in physical medicine and rehabilitation for the following conditions: Daily direction of a multidisciplinary physical rehabilitation program to ensure safe treatment while eliciting the highest outcome that is of practical value to the patient.: Yes Daily medical management of patient stability for increased activity during participation in an intensive rehabilitation regime.: Yes Daily analysis of laboratory values and/or radiology reports with any subsequent need for medication adjustment of medical intervention for : Neurological problems;Blood pressure problems   Kimiya Brunelle 10/20/2016, 4:40 PM

## 2016-10-20 NOTE — Progress Notes (Signed)
[facility]IC[facility]ABILITATION     PROGRESS NOTE    Subjective/Complaints: Up in bed eating. Tech in room  ROS: pt denies nausea, vomiting, diarrhea, cough, shortness of breath or chest pain   Objective: Vital Signs: Blood pressure 127/75, pulse 73, temperature 97.6 F (36.4 C), temperature source Axillary, resp. rate 18, height 5\' 10"  (1.778 m), weight 76.4 kg (168 lb 6.4 oz), SpO2 96 %. Dg Chest 2 View  Result Date: 10/19/2016 CLINICAL DATA:  83 year old presenting with an episode of choking earlier today. Current history of diabetes, hypertension and CHF. EXAM: CHEST  2 VIEW COMPARISON:  10/14/2016, 11/15/2015 and earlier. FINDINGS: Cardiac silhouette moderately enlarged, unchanged. Left subclavian biventricular pacer unchanged. Thoracic aorta mildly atherosclerotic, unchanged. Consolidation in the left lower lobe with partial silhouetting of the left hemidiaphragm. Mild atelectasis at the right lung base. Lungs otherwise clear. Pulmonary vascularity normal without evidence of pulmonary edema. IMPRESSION: 1. Left lower lobe atelectasis and/or pneumonia. 2. Mild right basilar atelectasis. 3. Stable cardiomegaly without pulmonary edema. Electronically Signed   By: Evangeline Dakin M.D.   On: 10/19/2016 21:33    Recent Labs  10/19/16 0539 10/20/16 0436  WBC 9.9 7.9  HGB 13.6 13.1  HCT 39.2 38.2*  PLT 163 188    Recent Labs  10/18/16 0744 10/18/16 1839 10/19/16 0539  NA 136  --  138  K 4.7  --  3.7  CL 105  --  105  GLUCOSE 142*  --  44*  BUN 28*  --  25*  CREATININE 1.20 1.17 1.03  CALCIUM 9.4  --  9.4   CBG (last 3)   Recent Labs  10/19/16 2120 10/20/16 0703 10/20/16 0734  GLUCAP 84 68 61*    Wt Readings from Last 3 Encounters:  10/20/16 76.4 kg (168 lb 6.4 oz)  10/16/16 75 kg (165 lb 5.5 oz)  09/03/16 79.4 kg (175 lb)    Physical Exam:  Constitutional: He appears well-developed and well-nourished.  HENT:  Head: Normocephalic and  atraumatic.  Eyes: EOM are normal. Right eye exhibits no discharge. Left eye exhibits no discharge.  Neck: Normal range of motion. Neck supple. No thyromegaly present.  Cardiovascular: RRR without murmur. No JVD  Respiratory: CTA Bilaterally without wheezes or rales. Normal effort  GI: Soft. Bowel sounds are normal. He exhibits no distension.  Musculoskeletal: He exhibits no edema or tenderness.  Neurological: He is alert.  Oriented to self and hospital. remains distracted.  Follows simple commands  Motor: 4-4+/5 throughout DTRs 3+ RUE/RLE.   Skin: Skin is warm and dry.  Psychiatric:  Pleasantly confused    Assessment/Plan: 1. Functional and cognitive deficits secondary to encephalopathy and recent right PICA infarct which require 3+ hours per day of interdisciplinary therapy in a comprehensive inpatient rehab setting. Physiatrist is providing close team supervision and 24 hour management of active medical problems listed below. Physiatrist and rehab team continue to assess barriers to discharge/monitor patient progress toward functional and medical goals.  Function:  Bathing Bathing position   Position: Wheelchair/chair at sink  Bathing parts Body parts bathed by patient: Right arm, Left arm, Chest, Abdomen, Front perineal area, Right upper leg, Left upper leg, Right lower leg, Left lower leg Body parts bathed by helper: Buttocks, Back  Bathing assist Assist Level: Touching or steadying assistance(Pt > 75%)      Upper Body Dressing/Undressing Upper body dressing   What is the patient wearing?: Pull over shirt/dress     Pull over shirt/dress -  Perfomed by patient: Thread/unthread right sleeve, Thread/unthread left sleeve, Put head through opening Pull over shirt/dress - Perfomed by helper: Pull shirt over trunk        Upper body assist Assist Level: Touching or steadying assistance(Pt > 75%)      Lower Body Dressing/Undressing Lower body dressing   What is the patient  wearing?: Pants, Non-skid slipper socks       Pants- Performed by helper: Thread/unthread right pants leg, Thread/unthread left pants leg, Pull pants up/down, Fasten/unfasten pants   Non-skid slipper socks- Performed by helper: Don/doff right sock, Don/doff left sock                  Lower body assist Assist for lower body dressing:  (Total Assist)      Toileting Toileting     Toileting steps completed by helper: Adjust clothing prior to toileting, Performs perineal hygiene, Adjust clothing after toileting Toileting Assistive Devices: Grab bar or rail  Toileting assist Assist level: Two helpers   Transfers Chair/bed transfer   Chair/bed transfer method: Other Chair/bed transfer assist level: Maximal assist (Pt 25 - 49%/lift and lower) Chair/bed transfer assistive device: Armrests, Mechanical lift Mechanical lift: Stedy   Locomotion Ambulation Ambulation activity did not occur: Safety/medical concerns         Wheelchair       Assist Level: Dependent (Pt equals 0%)  Cognition Comprehension Comprehension assist level: Understands basic 25 - 49% of the time/ requires cueing 50 - 75% of the time  Expression Expression assist level: Expresses basic 25 - 49% of the time/requires cueing 50 - 75% of the time. Uses single words/gestures.  Social Interaction Social Interaction assist level: Interacts appropriately 25 - 49% of time - Needs frequent redirection.  Problem Solving Problem solving assist level: Solves basic less than 25% of the time - needs direction nearly all the time or does not effectively solve problems and may need a restraint for safety  Memory Memory assist level: Recognizes or recalls less than 25% of the time/requires cueing greater than 75% of the time   Medical Problem List and Plan: 1.  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   -continue CIR therapies 2.  DVT Prophylaxis/Anticoagulation: Subcutaneous Lovenox. Monitor platelet counts of any signs of bleeding 3. Pain Management: Tylenol as needed 4. Mood: Provide emotional support 5. Neuropsych: This patient Is not capable of making decisions on his own behalf. 6. Skin/Wound Care: Routine skin checks 7. Fluids/Electrolytes/Nutrition: appears to have a good appetite  -I personally reviewed all of the patient's labs today, and lab work is improving.  8. Diabetes mellitus with peripheral neuropathy. Hemoglobin A1c 7.5. NovoLog 3 units 3 times a day with meals, Lantus insulin 20 units daily, Glucophage-XR 500 mg daily. Check blood sugars before meals and at bedtime.   -intake inconsistent  -decrease lantus insulin (already received today) to 10 units     9. CKD stage III.  Labs stable 10. CAD with pacemaker. Continue aspirin. No chest pain or shortness of breath 11. Chronic systolic congestive heart failure. Monitor for any signs of fluid overload   -daily weights 12. Hypertension. No current antihypertensive medications. Close monitoring with with mobility 13. Tick exposure.RMSF negative. Completing course of doxycycline   LOS (Days) 2 A FACE TO FACE EVALUATION WAS PERFORMED  Meredith Staggers, MD 10/20/2016 8:31 AM

## 2016-10-21 ENCOUNTER — Inpatient Hospital Stay (HOSPITAL_COMMUNITY): Payer: Medicare Other | Admitting: Occupational Therapy

## 2016-10-21 ENCOUNTER — Inpatient Hospital Stay (HOSPITAL_COMMUNITY): Payer: Medicare Other

## 2016-10-21 ENCOUNTER — Inpatient Hospital Stay (HOSPITAL_COMMUNITY): Payer: Medicare Other | Admitting: Physical Therapy

## 2016-10-21 ENCOUNTER — Inpatient Hospital Stay (HOSPITAL_COMMUNITY): Payer: Medicare Other | Admitting: Speech Pathology

## 2016-10-21 ENCOUNTER — Telehealth: Payer: Self-pay

## 2016-10-21 LAB — GLUCOSE, CAPILLARY
GLUCOSE-CAPILLARY: 197 mg/dL — AB (ref 65–99)
GLUCOSE-CAPILLARY: 148 mg/dL — AB (ref 65–99)
Glucose-Capillary: 198 mg/dL — ABNORMAL HIGH (ref 65–99)
Glucose-Capillary: 190 mg/dL — ABNORMAL HIGH (ref 65–99)

## 2016-10-21 NOTE — Progress Notes (Signed)
Social Work  Social Work Assessment and Plan  Patient Details  Name: [name] MRN: [id number] Date of Birth: 1930/11/25  Today's Date: 10/21/2016  Problem List:  Patient Active Problem List   Diagnosis Date Noted  . Acute encephalopathy 10/18/2016  . Tick bite of calf, initial encounter   . Stage 3 chronic kidney disease   . Diabetes mellitus type 2 in nonobese (HCC)   . Coronary artery disease involving native coronary artery of native heart without angina pectoris   . Dementia without behavioral disturbance   . Chronic systolic heart failure ([place])   . Debilitated 11/18/2015  . Neuropathic pain   . Tachycardia   . Pacemaker   . DM type 2 with diabetic peripheral neuropathy ([place])   . Benign essential HTN   . History of syncope   . LBBB (left bundle branch block)   . Acute blood loss anemia   . NSVT (nonsustained ventricular tachycardia) ([place])   . Bradycardia, severe sinus 11/13/2015  . Complete heart block ([place])   . Chronic combined systolic and diastolic heart failure ([place]) 04/27/2013  . Essential hypertension 04/27/2013  . Left bundle branch block 04/27/2013  . Type II or unspecified type diabetes mellitus with neurological manifestations, uncontrolled 01/15/2013   Past Medical History:  Past Medical History:  Diagnosis Date  . Cancer ([place])    hx of prostate ca  . Cataracts, bilateral    hx of  . CHF (congestive heart failure) ([place])   . Diabetes mellitus   . ED (erectile dysfunction)   . Hyperlipidemia   . Hypertension   . Neuromuscular disorder (HCC)    numbness in hand/cervical issues  . Pneumonia    hx of  . Syncope    hx of  . Urination frequency    Past Surgical History:  Past Surgical History:  Procedure Laterality Date  . CARDIAC CATHETERIZATION N/A 11/13/2015   Procedure: Temporary Pacemaker;  Surgeon: Lorretta Harp, MD;  Location: [facility];  Service: Cardiovascular;  Laterality: N/A;  . EP IMPLANTABLE DEVICE N/A 11/14/2015   Procedure: BiV Pacemaker Insertion CRT-P;  Surgeon: Deboraha Sprang, MD;  Location: [facility];  Service: Cardiovascular;  Laterality: N/A;  . EYE SURGERY     cataract surgery bilateral  . HERNIA REPAIR    . POSTERIOR CERVICAL FUSION/FORAMINOTOMY  11/18/2011   Procedure: POSTERIOR CERVICAL FUSION/FORAMINOTOMY LEVEL 1;  Surgeon: Eustace Moore, MD;  Location: [facility];  Service: Neurosurgery;  Laterality: Bilateral;  . PROSTATE SURGERY     s/p ca  . SMALL INTESTINE SURGERY     hx of  . TONSILLECTOMY     Social History:  reports that he has never smoked. He has never used smokeless tobacco. He reports that he does not drink alcohol or use drugs.  Family / Support Systems Marital Status: Widow/Widower Patient Roles: Parent Children: daughter, [name] @ [phone] or [name] Other Supports: friend, [name] @ [phone] Anticipated Caregiver: Daughter, [name] Ability/Limitations of Caregiver: no limitations, daughter does not work and has been caring for her dad in the home setting., managing the household.  I discussed with [name] that we anticipate her dad will need min and mod assist following a CIR stay.  Caregiver Availability: 24/7 Family Dynamics: Daughter states that she really wants to be able to care for her father at home and that "It's just me."  Social History Preferred language: English Religion: Baptist Cultural Background: NA Education:  HS Read: Yes Write: Yes Employment Status: Retired Freight forwarder Issues: None Guardian/Conservator: None - per MD, pt is not capable of making decisions on his own behalf - defer to daughter.   Abuse/Neglect Physical Abuse: Denies Verbal Abuse: Denies Sexual Abuse: Denies Exploitation of patient/patient's resources: Denies Self-Neglect: Denies  Emotional Status Pt's affect, behavior adn adjustment status: Pt with significant confusion and is unable to complete assessment  interivew.  Is able to state his address but reports he lives alone (daughter lives with him) and not oriented to situation or place.  Will monitor, if cognitive recovery, his mood and refer for neuropsych consult as appropriate.  Recent Psychosocial Issues: None Pyschiatric History: None Substance Abuse History: None  Patient / Family Perceptions, Expectations & Goals Pt/Family understanding of illness & functional limitations: Pt without any awareness of his medical situation.  Daughter with very basic understanding of his stroke and current functional limitations/ need for CIR. Premorbid pt/family roles/activities: Pt was still doing some driving.  Independent overall.  Daughter assisted as needed. Anticipated changes in roles/activities/participation: Pt now expected to require 24/7 physical assistance and daughter to increase her caregiver responsibilities. Pt/family expectations/goals: "I want to bring him home if I can."  [employer]: None Premorbid Home Care/DME Agencies: None Transportation available at discharge: yes Resource referrals recommended: Neuropsychology  Discharge Planning Living Arrangements: Children Support Systems: Children, Friends/neighbors Type of Residence: Private residence Insurance underwriter Resources: [employer], Multimedia programmer (specify) Sports administrator) Financial Resources: [place] Referred: No Living Expenses: Own Money Management: Patient Does the patient have any problems obtaining your medications?: No Home Management: pt and daughter Patient/Family Preliminary Plans: Daughter reports that she plans for pt to d/c home with her and she will provide care. Social Work Anticipated Follow Up Needs: HH/OP Expected length of stay: 18-22 days  Clinical Impression Unfortunate, elderly gentleman here following a CVA and with significant cognitive and physical deficits.  PTA he was living with his daughter who assisted as  needed and reports she is able to provide 24/7 assist now.  Daughter with basic awareness of the extent of his deficits but will need much caregiver education.  Will follow for support and d/c planning needs.  Andranik Jeune 10/21/2016, 3:04 PM

## 2016-10-21 NOTE — Progress Notes (Signed)
Social Work Patient ID: [name], male   DOB: Oct 27, 1930, 83 y.o.   MRN: [id number]   Have reviewed team conference with pt's daughter who is aware of ELOS 20-25 days with min/ mod assistance goals.  Daughter states that she feels she can provide this level of care "... As long as I have some help during the day...".  I clarified with her that I can arrange [place] therapies and discussed visit frequency to expect.  Stressed that she will be the primary caregiver 24/7 and will need to discuss alternate plans for d/c if she feels she is not able to provide needed care.  At this point, she intends for pt to d/d home.  Continue to follow.  Avonell Lenig, LCSW

## 2016-10-21 NOTE — Progress Notes (Signed)
Social Work Patient ID: [name], male   DOB: 03/31/1931, 83 y.o.   MRN: [id number]  Rexene Alberts Social Worker Signed   Patient Care Conference Date of Service: 10/21/2016 10:25 AM      Hide copied text Hover for attribution information Inpatient RehabilitationTeam Conference and Plan of Care Update Date: 10/19/2016   Time: 2:15 PM      Patient Name: [name]      Medical Record Number: [id number]  Date of Birth: 06-10-1930 Sex: Male         Room/Bed: 4W10C/4W10C-01 Payor Info: Payor: MEDICARE / Plan: MEDICARE PART A AND B / Product Type: *No Product type* /     Admitting Diagnosis: CVA  Admit Date/Time:  10/18/2016  4:44 PM Admission Comments: No comment available    Primary Diagnosis:  <principal problem not specified> Principal Problem: <principal problem not specified>       Patient Active Problem List    Diagnosis Date Noted  . Acute encephalopathy 10/18/2016  . Tick bite of calf, initial encounter    . Stage 3 chronic kidney disease    . Diabetes mellitus type 2 in nonobese (HCC)    . Coronary artery disease involving native coronary artery of native heart without angina pectoris    . Dementia without behavioral disturbance    . Chronic systolic heart failure ([place])    . Debilitated 11/18/2015  . Neuropathic pain    . Tachycardia    . Pacemaker    . DM type 2 with diabetic peripheral neuropathy ([place])    . Benign essential HTN    . History of syncope    . LBBB (left bundle branch block)    . Acute blood loss anemia    . NSVT (nonsustained ventricular tachycardia) ([place])    . Bradycardia, severe sinus 11/13/2015  . Complete heart block ([place])    . Chronic combined systolic and diastolic heart failure ([place]) 04/27/2013  . Essential hypertension 04/27/2013  . Left bundle branch block 04/27/2013  . Type II or unspecified type diabetes mellitus with neurological manifestations, uncontrolled 01/15/2013      Expected Discharge Date: Expected Discharge Date:   (20-25 days)   Team Members Present: Physician leading conference: Dr. Alger Simons Social Worker Present: Lennart Pall, LCSW Nurse Present: Rayetta Pigg, RN PT Present: Kem Parkinson, PT OT Present: Roanna Epley, COTA;Stephanie Schlosser, OT SLP Present: Gunnar Fusi, SLP PPS Coordinator present : Daiva Nakayama, RN, CRRN       Current Status/Progress Goal Weekly Team Focus  Medical     pica infarct, encephalopathy, dm, ckd  improve balance and functional mobility  renal, kidneys, nutrition   Bowel/Bladder     incontinent of bowel and bladder LBM 10-16-16  Continent of bowel and bladder, regular bowel pattern  timed tolieting q 2   Swallow/Nutrition/ Hydration     Regular, textures; thin liquids: needs full supervision due to cognition   Supervision   safety with PO intake    ADL's     max A for functional transfers, max A sit<>stand, tot A for LB dressing, min A UB dressing, max verbal cues for attention to task, max A for orientaiton  min A overall  cognitive remediation, sit<>stand, standing balance, attention, functional transfers   Mobility     max to +2 assist  min/supervision overall  cognitive remediation, endurance, transfers, initiation of gait/stairs, neuro re-ed for postural control and balance   Communication     functional communication is  impacted by cognition          Safety/Cognition/ Behavioral Observations   Max to total assist, very limited by lethargy and fleeting sustained attention on eval  Mod Assist   arousal, orientation, attention to basic, familiar tasks    Pain     no c/o pain (tylenol 650)  0  Assess pain q shift and prn   Skin     no skin issues   no new skin issues   turn schedule, asses skini q shift and prn     Rehab Goals Patient on target to meet rehab goals: Yes *See Care Plan and progress notes for long and short-term goals.   Barriers to Discharge: safety awareness, balance     Possible Resolutions to Barriers:  adative  equipment and education     Discharge Planning/Teaching Needs:  Plan to d/c home with daughter vs SNF if she feels she cannot meet care needs.      Team Discussion:  New eval.  BSs variable.  Total  - max assist on eval with min assist goals overall.  Significant cognitive deficits.  SW still to confirm d/c plan.  Revisions to Treatment Plan:  None    Continued Need for Acute Rehabilitation Level of Care: The patient requires daily medical management by a physician with specialized training in physical medicine and rehabilitation for the following conditions: Daily direction of a multidisciplinary physical rehabilitation program to ensure safe treatment while eliciting the highest outcome that is of practical value to the patient.: Yes Daily medical management of patient stability for increased activity during participation in an intensive rehabilitation regime.: Yes Daily analysis of laboratory values and/or radiology reports with any subsequent need for medication adjustment of medical intervention for : Neurological problems;Blood pressure problems   Arish Redner 10/21/2016, 10:26 AM      Lowella Curb, LCSW Social Worker Signed   Patient Care Conference Date of Service: 10/19/2016  4:49 PM      Hide copied text Hover for attribution information Inpatient RehabilitationTeam Conference and Plan of Care Update Date: 10/19/2016   Time: 2:15 PM      Patient Name: [name]      Medical Record Number: [id number]  Date of Birth: [birth year] Sex: Male         Room/Bed: 4W10C/4W10C-01 Payor Info: Payor: MEDICARE / Plan: MEDICARE PART A AND B / Product Type: *No Product type* /     Admitting Diagnosis: CVA  Admit Date/Time:  10/18/2016  4:44 PM Admission Comments: No comment available    Primary Diagnosis:  <principal problem not specified> Principal Problem: <principal problem not specified>       Patient Active Problem List    Diagnosis Date Noted  . Acute encephalopathy  10/18/2016  . Tick bite of calf, initial encounter    . Stage 3 chronic kidney disease    . Diabetes mellitus type 2 in nonobese (HCC)    . Coronary artery disease involving native coronary artery of native heart without angina pectoris    . Dementia without behavioral disturbance    . Chronic systolic heart failure ([place])    . Debilitated 11/18/2015  . Neuropathic pain    . Tachycardia    . Pacemaker    . DM type 2 with diabetic peripheral neuropathy ([place])    . Benign essential HTN    . History of syncope    . LBBB (left bundle branch block)    . Acute blood loss anemia    .  NSVT (nonsustained ventricular tachycardia) ([place])    . Bradycardia, severe sinus 11/13/2015  . Complete heart block ([place])    . Chronic combined systolic and diastolic heart failure ([place]) 04/27/2013  . Essential hypertension 04/27/2013  . Left bundle branch block 04/27/2013  . Type II or unspecified type diabetes mellitus with neurological manifestations, uncontrolled 01/15/2013      Expected Discharge Date: Expected Discharge Date:  (20-25 days)   Team Members Present: Physician leading conference: Dr. Alger Simons Social Worker Present: Lennart Pall, LCSW Nurse Present: Rayetta Pigg, RN PT Present: Kem Parkinson, PT OT Present: Roanna Epley, COTA;Stephanie Schlosser, OT SLP Present: Gunnar Fusi, SLP PPS Coordinator present : Daiva Nakayama, RN, CRRN       Current Status/Progress Goal Weekly Team Focus  Medical     pica infarct, encephalopathy, dm, ckd  improve balance and functional mobility  renal, kidneys, nutrition   Bowel/Bladder     incontinent of bowel and bladder LBM 10-16-16  Continent of bowel and bladder, regular bowel pattern  timed tolieting q 2   Swallow/Nutrition/ Hydration     Regular, textures; thin liquids: needs full supervision due to cognition   Supervision   safety with PO intake    ADL's     max A for functional transfers, max A sit<>stand, tot A for LB dressing, min A UB  dressing, max verbal cues for attention to task, max A for orientaiton  min A overall  cognitive remediation, sit<>stand, standing balance, attention, functional transfers   Mobility     max to +2 assist  min/supervision overall  cognitive remediation, endurance, transfers, initiation of gait/stairs, neuro re-ed for postural control and balance   Communication     functional communication is impacted by cognition          Safety/Cognition/ Behavioral Observations   Max to total assist, very limited by lethargy and fleeting sustained attention on eval  Mod Assist   arousal, orientation, attention to basic, familiar tasks    Pain     no c/o pain (tylenol 650)  0  Assess pain q shift and prn   Skin     no skin issues   no new skin issues   turn schedule, asses skini q shift and prn     Rehab Goals Patient on target to meet rehab goals: Yes *See Care Plan and progress notes for long and short-term goals.   Barriers to Discharge: safety awareness, balance     Possible Resolutions to Barriers:  adative equipment and education     Discharge Planning/Teaching Needs:  Plan to d/c home with daughter vs SNF if she feels she cannot meet care needs.      Team Discussion:  New eval.  BSs extremely variable - MD aware.  Total - max assist on evels and hope to reach min assist goals overall.  Est 20-25 d LOS  Revisions to Treatment Plan:  None    Continued Need for Acute Rehabilitation Level of Care: The patient requires daily medical management by a physician with specialized training in physical medicine and rehabilitation for the following conditions: Daily direction of a multidisciplinary physical rehabilitation program to ensure safe treatment while eliciting the highest outcome that is of practical value to the patient.: Yes Daily medical management of patient stability for increased activity during participation in an intensive rehabilitation regime.: Yes Daily analysis of laboratory  values and/or radiology reports with any subsequent need for medication adjustment of medical intervention for : Neurological problems;Blood pressure  problems   Ashara Lounsbury 10/20/2016, 4:40 PM

## 2016-10-21 NOTE — Patient Care Conference (Signed)
Inpatient RehabilitationTeam Conference and Plan of Care Update Date: 10/19/2016   Time: 2:15 PM    Patient Name: [name]      Medical Record Number: [id number]  Date of Birth: [birth year] Sex: Male         Room/Bed: 4W10C/4W10C-01 Payor Info: Payor: MEDICARE / Plan: MEDICARE PART A AND B / Product Type: *No Product type* /    Admitting Diagnosis: CVA  Admit Date/Time:  10/18/2016  4:44 PM Admission Comments: No comment available   Primary Diagnosis:  <principal problem not specified> Principal Problem: <principal problem not specified>  Patient Active Problem List   Diagnosis Date Noted  . Acute encephalopathy 10/18/2016  . Tick bite of calf, initial encounter   . Stage 3 chronic kidney disease   . Diabetes mellitus type 2 in nonobese (HCC)   . Coronary artery disease involving native coronary artery of native heart without angina pectoris   . Dementia without behavioral disturbance   . Chronic systolic heart failure ([place])   . Debilitated 11/18/2015  . Neuropathic pain   . Tachycardia   . Pacemaker   . DM type 2 with diabetic peripheral neuropathy ([place])   . Benign essential HTN   . History of syncope   . LBBB (left bundle branch block)   . Acute blood loss anemia   . NSVT (nonsustained ventricular tachycardia) ([place])   . Bradycardia, severe sinus 11/13/2015  . Complete heart block ([place])   . Chronic combined systolic and diastolic heart failure ([place]) 04/27/2013  . Essential hypertension 04/27/2013  . Left bundle branch block 04/27/2013  . Type II or unspecified type diabetes mellitus with neurological manifestations, uncontrolled 01/15/2013    Expected Discharge Date: Expected Discharge Date:  (20-25 days)  Team Members Present: Physician leading conference: Dr. Alger Simons Social Worker Present: Lennart Pall, LCSW Nurse Present: Rayetta Pigg, RN PT Present: Kem Parkinson, PT OT Present: Roanna Epley, COTA;Stephanie Schlosser, OT SLP Present: Gunnar Fusi, SLP PPS Coordinator present : Daiva Nakayama, RN, CRRN     Current Status/Progress Goal Weekly Team Focus  Medical   pica infarct, encephalopathy, dm, ckd  improve balance and functional mobility  renal, kidneys, nutrition   Bowel/Bladder   incontinent of bowel and bladder LBM 10-16-16  Continent of bowel and bladder, regular bowel pattern  timed tolieting q 2   Swallow/Nutrition/ Hydration   Regular, textures; thin liquids: needs full supervision due to cognition   Supervision   safety with PO intake    ADL's   max A for functional transfers, max A sit<>stand, tot A for LB dressing, min A UB dressing, max verbal cues for attention to task, max A for orientaiton  min A overall  cognitive remediation, sit<>stand, standing balance, attention, functional transfers   Mobility   max to +2 assist  min/supervision overall  cognitive remediation, endurance, transfers, initiation of gait/stairs, neuro re-ed for postural control and balance   Communication   functional communication is impacted by cognition          Safety/Cognition/ Behavioral Observations  Max to total assist, very limited by lethargy and fleeting sustained attention on eval  Mod Assist   arousal, orientation, attention to basic, familiar tasks    Pain   no c/o pain (tylenol 650)  0  Assess pain q shift and prn   Skin   no skin issues   no new skin issues   turn schedule, asses skini q shift and prn    Rehab Goals Patient  on target to meet rehab goals: Yes *See Care Plan and progress notes for long and short-term goals.  Barriers to Discharge: safety awareness, balance    Possible Resolutions to Barriers:  adative equipment and education    Discharge Planning/Teaching Needs:  Plan to d/c home with daughter vs SNF if she feels she cannot meet care needs.      Team Discussion:  New eval.  BSs variable.  Total  - max assist on eval with min assist goals overall.  Significant cognitive deficits.  SW still to confirm  d/c plan.  Revisions to Treatment Plan:  None   Continued Need for Acute Rehabilitation Level of Care: The patient requires daily medical management by a physician with specialized training in physical medicine and rehabilitation for the following conditions: Daily direction of a multidisciplinary physical rehabilitation program to ensure safe treatment while eliciting the highest outcome that is of practical value to the patient.: Yes Daily medical management of patient stability for increased activity during participation in an intensive rehabilitation regime.: Yes Daily analysis of laboratory values and/or radiology reports with any subsequent need for medication adjustment of medical intervention for : Neurological problems;Blood pressure problems  Leisha Trinkle 10/21/2016, 10:26 AM

## 2016-10-21 NOTE — IPOC Note (Signed)
Overall Plan of Care [facility]) Patient Details Name: [name] MRN: [id number] DOB: 15-Aug-1930  Admitting Diagnosis: CVA  Hospital Problems: Active Problems:   Acute encephalopathy     Functional Problem List: Nursing Bowel, Bladder, Endurance, Motor, Skin Integrity, Safety, Perception  PT Balance, Behavior, Endurance, Motor, Pain, Safety, Skin Integrity  OT Balance, Cognition, Safety, Endurance  SLP Cognition, Nutrition  TR         Basic ADL's: OT Grooming, Bathing, Dressing, Toileting, Eating     Advanced  ADL's: OT       Transfers: PT Bed to Chair, Bed Mobility, Car, Manufacturing systems engineer, Metallurgist: PT Ambulation, Emergency planning/management officer, Stairs     Additional Impairments: OT    SLP Swallowing, Social Cognition   Problem Solving, Memory, Attention, Awareness  TR      Anticipated Outcomes Item Anticipated Outcome  Self Feeding supervision  Swallowing  Supervision    Basic self-care  MinA  Toileting  MinA   Bathroom Transfers MinA  Bowel/Bladder  Mod assist  Transfers  min assist overall  Locomotion  min assist household gait and stairs  Communication     Cognition  Mod Assist   Pain  N/a  Safety/Judgment  mod assist   Therapy Plan: PT Intensity: Minimum of 1-2 x/day ,45 to 90 minutes PT Frequency: 5 out of 7 days PT Duration Estimated Length of Stay: 18-22 days OT Intensity: Minimum of 1-2 x/day, 45 to 90 minutes OT Frequency: 5 out of 7 days OT Duration/Estimated Length of Stay: 18-21 days  SLP Intensity: Minumum of 1-2 x/day, 30 to 90 minutes SLP Frequency: 3 to 5 out of 7 days SLP Duration/Estimated Length of Stay: 14-21 days        Team Interventions: Nursing Interventions Patient/Family Education, Bladder Management, Bowel Management, Medication Management, Disease Management/Prevention, Skin Care/Wound Management, Discharge Planning, Cognitive Remediation/Compensation  PT interventions Ambulation/gait training,  Balance/vestibular training, Cognitive remediation/compensation, Community reintegration, Disease management/prevention, Discharge planning, DME/adaptive equipment instruction, Functional mobility training, Neuromuscular re-education, Pain management, Psychosocial support, Patient/family education, Skin care/wound management, Splinting/orthotics, Stair training, Therapeutic Activities, Therapeutic Exercise, UE/LE Strength taining/ROM, UE/LE Coordination activities, Wheelchair propulsion/positioning  OT Interventions Balance/vestibular training, Discharge planning, Self Care/advanced ADL retraining, Therapeutic Activities, UE/LE Coordination activities, Cognitive remediation/compensation, Disease mangement/prevention, Functional mobility training, Patient/family education, Therapeutic Exercise, DME/adaptive equipment instruction, Community reintegration, Psychosocial support, UE/LE Strength taining/ROM, Wheelchair propulsion/positioning  SLP Interventions Cognitive remediation/compensation, English as a second language teacher, Dysphagia/aspiration precaution training, Functional tasks, Internal/external aids, Patient/family education  TR Interventions    SW/CM Interventions Discharge Planning, Psychosocial Support, Patient/Family Education    Team Discharge Planning: Destination: PT-Home ,OT- Home , SLP-Home Projected Follow-up: PT-Home health PT, 24 hour supervision/assistance, OT-  24 hour supervision/assistance, Other (comment) (TBD), SLP-Home Health SLP, Outpatient SLP, Skilled Nursing facility, 24 hour supervision/assistance Projected Equipment Needs: PT-To be determined, OT- To be determined, SLP-None recommended by SLP Equipment Details: PT- , OT-  Patient/family involved in discharge planning: PT- Patient,  OT-Patient unable/family or caregiver not available, SLP-Patient unable/family or caregive not available  MD ELOS: 18-20 days Medical Rehab Prognosis:  Good Assessment: The patient has been admitted for CIR  therapies with the diagnosis of right cerebellar infarct, debility, encephalopathy. The team will be addressing functional mobility, strength, stamina, balance, safety, adaptive techniques and equipment, self-care, bowel and bladder mgt, patient and caregiver education, speech, swallow, cognition, NMR, vestibular rx, community reintegration. Goals have been set at min assist with self-care and mod assist with transfers/locomotion and cognition.  Meredith Staggers, MD, FAAPMR      See Team Conference Notes for weekly updates to the plan of care

## 2016-10-21 NOTE — Progress Notes (Signed)
[facility] PHYSICAL MEDICINE & REHABILITATION     PROGRESS NOTE    Subjective/Complaints: Eating breakfast. No new complaints.   ROS: pt denies nausea, vomiting, diarrhea, cough, shortness of breath or chest pain   Objective: Vital Signs: Blood pressure 126/79, pulse 73, temperature 97.6 F (36.4 C), temperature source Axillary, resp. rate 18, height 5\' 10"  (1.778 m), weight 76.4 kg (168 lb 6.4 oz), SpO2 100 %. Dg Chest 2 View  Result Date: 10/19/2016 CLINICAL DATA:  83 year old presenting with an episode of choking earlier today. Current history of diabetes, hypertension and CHF. EXAM: CHEST  2 VIEW COMPARISON:  10/14/2016, 11/15/2015 and earlier. FINDINGS: Cardiac silhouette moderately enlarged, unchanged. Left subclavian biventricular pacer unchanged. Thoracic aorta mildly atherosclerotic, unchanged. Consolidation in the left lower lobe with partial silhouetting of the left hemidiaphragm. Mild atelectasis at the right lung base. Lungs otherwise clear. Pulmonary vascularity normal without evidence of pulmonary edema. IMPRESSION: 1. Left lower lobe atelectasis and/or pneumonia. 2. Mild right basilar atelectasis. 3. Stable cardiomegaly without pulmonary edema. Electronically Signed   By: Evangeline Dakin M.D.   On: 10/19/2016 21:33    Recent Labs  10/19/16 0539 10/20/16 0436  WBC 9.9 7.9  HGB 13.6 13.1  HCT 39.2 38.2*  PLT 163 188    Recent Labs  10/18/16 1839 10/19/16 0539  NA  --  138  K  --  3.7  CL  --  105  GLUCOSE  --  44*  BUN  --  25*  CREATININE 1.17 1.03  CALCIUM  --  9.4   CBG (last 3)   Recent Labs  10/20/16 2129 10/20/16 2245 10/21/16 0647  GLUCAP 117* 225* 197*    Wt Readings from Last 3 Encounters:  10/20/16 76.4 kg (168 lb 6.4 oz)  10/16/16 75 kg (165 lb 5.5 oz)  09/03/16 79.4 kg (175 lb)    Physical Exam:  Constitutional: He appears well-developed and well-nourished.  HENT:  Head: Normocephalic and atraumatic.  Eyes: EOM are normal.  Right eye exhibits no discharge. Left eye exhibits no discharge.  Neck: Normal range of motion. Neck supple. No thyromegaly present.  Cardiovascular: RRR without murmur. No JVD   Respiratory: CTA Bilaterally without wheezes or rales. Normal effort GI: Soft. Bowel sounds are normal. He exhibits no distension.  Musculoskeletal: He exhibits no edema or tenderness.  Neurological: He is alert.  Oriented to self and hospital. remains distracted.  Follows simple commands  Motor: 4-4+/5 through all 4's.  DTRs 3+ RUE/RLE.   Skin: Skin is warm and dry.  Psychiatric:  Pleasantly confused    Assessment/Plan: 1. Functional and cognitive deficits secondary to encephalopathy and recent right PICA infarct which require 3+ hours per day of interdisciplinary therapy in a comprehensive inpatient rehab setting. Physiatrist is providing close team supervision and 24 hour management of active medical problems listed below. Physiatrist and rehab team continue to assess barriers to discharge/monitor patient progress toward functional and medical goals.  Function:  Bathing Bathing position   Position: Wheelchair/chair at sink  Bathing parts Body parts bathed by patient: Right arm, Left arm, Right upper leg, Left upper leg, Chest, Abdomen Body parts bathed by helper: Front perineal area, Buttocks, Right lower leg, Left lower leg, Back  Bathing assist Assist Level: Touching or steadying assistance(Pt > 75%)      Upper Body Dressing/Undressing Upper body dressing   What is the patient wearing?: Pull over shirt/dress     Pull over shirt/dress - Perfomed by patient: Thread/unthread right sleeve, Thread/unthread  left sleeve, Put head through opening Pull over shirt/dress - Perfomed by helper: Pull shirt over trunk        Upper body assist Assist Level: Touching or steadying assistance(Pt > 75%)      Lower Body Dressing/Undressing Lower body dressing   What is the patient wearing?: Underwear, Pants,  Non-skid slipper socks   Underwear - Performed by helper: Thread/unthread right underwear leg, Thread/unthread left underwear leg, Pull underwear up/down   Pants- Performed by helper: Thread/unthread right pants leg, Thread/unthread left pants leg, Pull pants up/down, Fasten/unfasten pants   Non-skid slipper socks- Performed by helper: Don/doff right sock, Don/doff left sock                  Lower body assist Assist for lower body dressing:  (Total Assist)      Toileting Toileting     Toileting steps completed by helper: Adjust clothing prior to toileting, Performs perineal hygiene, Adjust clothing after toileting Toileting Assistive Devices: Grab bar or rail  Toileting assist Assist level: Two helpers   Transfers Chair/bed transfer   Chair/bed transfer method: Stand pivot Chair/bed transfer assist level: Moderate assist (Pt 50 - 74%/lift or lower) Chair/bed transfer assistive device: Armrests Mechanical lift: Stedy   Locomotion Ambulation Ambulation activity did not occur: Safety/medical concerns   Max distance: 57 Assist level: Moderate assist (Pt 50 - 74%)   Wheelchair   Type: Manual Max wheelchair distance: 75 Assist Level: Touching or steadying assistance (Pt > 75%)  Cognition Comprehension Comprehension assist level: Understands basic 25 - 49% of the time/ requires cueing 50 - 75% of the time  Expression Expression assist level: Expresses basic 25 - 49% of the time/requires cueing 50 - 75% of the time. Uses single words/gestures.  Social Interaction Social Interaction assist level: Interacts appropriately 25 - 49% of time - Needs frequent redirection.  Problem Solving Problem solving assist level: Solves basic less than 25% of the time - needs direction nearly all the time or does not effectively solve problems and may need a restraint for safety  Memory Memory assist level: Recognizes or recalls less than 25% of the time/requires cueing greater than 75% of the  time   Medical Problem List and Plan: 1.  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   -continue CIR therapies 2.  DVT Prophylaxis/Anticoagulation: Subcutaneous Lovenox. Monitor platelet counts of any signs of bleeding 3. Pain Management: Tylenol as needed 4. Mood: Provide emotional support 5. Neuropsych: This patient Is not capable of making decisions on his own behalf. 6. Skin/Wound Care: Routine skin checks 7. Fluids/Electrolytes/Nutrition: appears to have a good appetite  -I personally reviewed all of the patient's labs today, and lab work is improving.  8. Diabetes mellitus with peripheral neuropathy. Hemoglobin A1c 7.5. NovoLog 3 units 3 times a day with meals, Lantus insulin 20 units daily, Glucophage-XR 500 mg daily. Check blood sugars before meals and at bedtime.   -intake inconsistent as well as CBG's  -decreased lantus insulin  to 10 units  qam---observe today    9. CKD stage III.  Labs stable 10. CAD with pacemaker. Continue aspirin. No chest pain or shortness of breath 11. Chronic systolic congestive heart failure. Monitor for any signs of fluid overload   -daily weights 12. Hypertension. No current antihypertensive medications. Close monitoring with with mobility 13. Tick exposure.RMSF negative. Completing course of doxycycline--continue for 5 days while  here---no abx were recommended "after discharge" (from acute).    LOS (Days) 3 A [place] T, MD 10/21/2016 9:08 AM

## 2016-10-21 NOTE — Progress Notes (Signed)
Physical Therapy Session Note  Patient Details  Name: [name] MRN: [id number] Date of Birth: [birth year]  Today's Date: 10/21/2016 PT Individual Time: 0900-1000 PT Individual Time Calculation (min): 60 min   Short Term Goals: Week 1:  PT Short Term Goal 1 (Week 1): Pt will be able to transfer with mod assist PT Short Term Goal 2 (Week 1): Pt will be able to gait x 25' with max +2 PT Short Term Goal 3 (Week 1): Pt will be able to initiate stair negotiation  Skilled Therapeutic Interventions/Progress Updates:  Pt received asleep supine in bed. Pt awoken easily and agreeable to get out of bed. Pt max A +2 with supine>sit with multimodal cuing and pt tendency to push himself posteriorly with UEs. Pt able to find better midline orientation when cued to place hands on his knees to prevent pt from pushing posteriorly while seated on EOB with variable min>max A. Squat pivot transfer total A+2. Pt orientated to person but remains disoriented to place and time, stating he is in [place] and the year is 1966 and then 1918 when asked to recall the year a few minutes after being informed it is 2018. Max>mod A during sit>stand before ambulation trials from w/c using RW. Pt required tactile and verbal cues for hand placement on RW and w/c armrest and anterior lean during sit>stand; pt unable to complete sit>stand with both UEs pushing from w/c due to lack of anterior lean. Pt ambulated x3 trials with RW with min/mod A with manual facilitation at posterior L knee to prevent recurvatum and assist with steering/managing RW; ambulation distances varied between 50-35f. Pt completed ambulatory car transfer with mod A for management of RW, manual facilitation for hip extension and management of LEs in/out of car and max multimodal cuing for RW management and safety, to complete turn before sitting, and maintenance of upright posture. Pt total A in w/c to nurses station with quick release belt applied and all needs  met.  Therapy Documentation Precautions:  Precautions Precautions: Fall, ICD/Pacemaker Restrictions Weight Bearing Restrictions: No   See Function Navigator for Current Functional Status.   Therapy/Group: Individual Therapy  CAlysia Penna6/14/2018, 12:09 PM

## 2016-10-21 NOTE — Progress Notes (Signed)
Occupational Therapy Session Note  Patient Details  Name: [name] MRN: [id number] Date of Birth: 08-06-1930  Today's Date: 10/21/2016 OT Individual Time: 8675-4492 OT Individual Time Calculation (min): 39 min    Short Term Goals: Week 1:  OT Short Term Goal 1 (Week 1): Pt will complete stand pivot transfer to toilet with [place].  OT Short Term Goal 2 (Week 1): Pt will demonstrate sustained attention for completing at least 2/3 seated grooming ADL tasks. OT Short Term Goal 3 (Week 1): Pt will complete LB dressing with ModA.  OT Short Term Goal 4 (Week 1): Pt will demonstrate dynamic sitting balance with MinA during functional task completion.   Skilled Therapeutic Interventions/Progress Updates:    Pt seen for OT session focusing on ADL re-training and orientation. Pt sitting up in w/c upon arrival with NT present assisting with lunch. Hand off to OT and provided supervision assist with VCs for attention to task as pt easily distracted by conversation, and not swallowing as a result. In therapy day room, in mildly distracting environment, completed sequencing task with days of the week. Pt required increased time and mod cuing for sequencing 7 days of the day. He correctly sequenced 3 days before requiring assist. Completed x2 trials with same results each trial. Pt able to correctly verbalize sequence of the days. Throughout session, pt not orientated to year or month. He correctly named city, however, was not orientated to place/ situation.  He returned to room and completed oral care with set-up assist and VCs for sequencing to next stage of task. Pt left seated at nurses station at end of session for safety. QRB donned.    Therapy Documentation Precautions:  Precautions Precautions: Fall, ICD/Pacemaker Restrictions Weight Bearing Restrictions: No Pain:   No/ denies pain ADL: ADL ADL Comments: see functional navigator   See Function Navigator for Current Functional  Status.   Therapy/Group: Individual Therapy  Lewis, Lakeidra Reliford C 10/21/2016, 7:22 AM

## 2016-10-21 NOTE — Telephone Encounter (Signed)
Error

## 2016-10-21 NOTE — Progress Notes (Signed)
Speech Language Pathology Daily Session Note  Patient Details  Name: [name] MRN: [id number] Date of Birth: Aug 09, 1930  Today's Date: 10/21/2016 SLP Individual Time: 1400-1445 SLP Individual Time Calculation (min): 45 min  Short Term Goals: Week 1: SLP Short Term Goal 1 (Week 1): Pt will consume dys 1 textures and thin liquids with min assist verbal cues for use of swallowing precautions and minimal overt s/s of aspiration  SLP Short Term Goal 1 - Progress (Week 1): Revised due to lack of progress SLP Short Term Goal 2 (Week 1): Pt will maintain alertness for 2 minute intervals with max assist multimodal cues.   SLP Short Term Goal 3 (Week 1): Pt will sustain his attention to task for 1 minute intervals with max assist multimodal cues for redirection.   SLP Short Term Goal 4 (Week 1): Pt will utilize external aids to orient to place, date, and situation with max assist multimodal cues.    Skilled Therapeutic Interventions: Skilled treatment session focused on dysphagia and cognition. SLP facilitated session by providing skilled observation of pt consuming thin liquids via cup with trials of graham crackers. Pt exhibited wet voice and cough with each isolated bolus of thin liquids. Pt given trials of nectar thick liquids via cup sips and no overt s/s of aspiration were noted. Therefore, pt's diet downgraded to nectar thick liquids. Pt with increased oral manipulation of trials of graham crackers but exhibited decreased arousal with whole bolus in mouth. Pt with cough present on graham crackers possibly d/t delayed swallow initiation from decreased attention/arousal. Pt required Max A to Mod A to maintain alertness for 2 minute intervals and he was not able to choose correction orientation information given Max A. Pt was returned to nursing station for continued supervision.      Function:  Eating Eating   Modified Consistency Diet: Yes Eating Assist Level: Set up assist for;Supervision or  verbal cues;Help with picking up utensils;Help managing cup/glass;Helper checks for pocketed food   Eating Set Up Assist For: Opening containers   Helper Brings Food to Mouth: Every scoop   Cognition Comprehension Comprehension assist level: Understands basic less than 25% of the time/ requires cueing >75% of the time  Expression   Expression assist level: Expresses basis less than 25% of the time/requires cueing >75% of the time.  Social Interaction Social Interaction assist level: Interacts appropriately 25 - 49% of time - Needs frequent redirection.  Problem Solving Problem solving assist level: Solves basic less than 25% of the time - needs direction nearly all the time or does not effectively solve problems and may need a restraint for safety  Memory Memory assist level: Recognizes or recalls less than 25% of the time/requires cueing greater than 75% of the time    Pain    Therapy/Group: Individual Therapy  Hazelee Harbold 10/21/2016, 2:33 PM

## 2016-10-21 NOTE — Care Management Note (Signed)
[facility] Individual Statement of Services  Patient Name:  [name]  Date:  10/21/2016  Welcome to the [facility].  Our goal is to provide you with an individualized program based on your diagnosis and situation, designed to meet your specific needs.  With this comprehensive rehabilitation program, you will be expected to participate in at least 3 hours of rehabilitation therapies Monday-Friday, with modified therapy programming on the weekends.  Your rehabilitation program will include the following services:  Physical Therapy (PT), Occupational Therapy (OT), Speech Therapy (ST), 24 hour per day rehabilitation nursing, Therapeutic Recreaction (TR), Neuropsychology, Case Management (Social Worker), Rehabilitation Medicine, Nutrition Services and Pharmacy Services  Weekly team conferences will be held on Tuesdays to discuss your progress.  Your Social Worker will talk with you frequently to get your input and to update you on team discussions.  Team conferences with you and your family in attendance may also be held.  Expected length of stay: 3 weeks  Overall anticipated outcome: moderate assistance  Depending on your progress and recovery, your program may change. Your Social Worker will coordinate services and will keep you informed of any changes. Your Social Worker's name and contact numbers are listed  below.  The following services may also be recommended but are not provided by the [facility] will be made to provide these services after discharge if needed.  Arrangements include referral to agencies that provide these services.  Your insurance has been verified to be:  Medicare and [facility] Your primary doctor is:  Dr. Dwyane Dee  Pertinent information will be shared with your doctor and your insurance  company.  Social Worker:  [place], [place] or ([phone]   Information discussed with and copy given to patient by: Lennart Pall, 10/21/2016, 3:07 PM

## 2016-10-21 NOTE — Progress Notes (Signed)
Occupational Therapy Session Note  Patient Details  Name: CHRYSTIAN CUPPLES MRN: [id number] Date of Birth: [birth year]  Today's Date: 10/21/2016 OT Individual Time: 1100-1200 OT Individual Time Calculation (min): 60 min    Short Term Goals: Week 1:  OT Short Term Goal 1 (Week 1): Pt will complete stand pivot transfer to toilet with [place].  OT Short Term Goal 2 (Week 1): Pt will demonstrate sustained attention for completing at least 2/3 seated grooming ADL tasks. OT Short Term Goal 3 (Week 1): Pt will complete LB dressing with ModA.  OT Short Term Goal 4 (Week 1): Pt will demonstrate dynamic sitting balance with MinA during functional task completion.   Skilled Therapeutic Interventions/Progress Updates:    Pt engaged in BADL retraining including bathing/dressing with sit<>stand from w/c at sink. Focus on task initiation, attention to task, sequencing, sit<>stand, standing balance, cognitive remediation, and activity tolerance.  Pt required min verbal cues to keep eyes open when resting between dressing segments.  Pt required max verbal cues for task initiation and sequencing.  Pt required max verbal cues for attention to task.  Pt became distracted during dressing tasks (donning shirt and pants) and disengaged from task.  Pt required max A for sit<>stand at sink but was able to maintain static standing for approx 1 min with supervision and min verbal cues to remain standing to facilitate LB dressing tasks.  Pt remained in w/c with QRB in place and positioned at nursing station for safety.  Therapy Documentation Precautions:  Precautions Precautions: Fall, ICD/Pacemaker Restrictions Weight Bearing Restrictions: No Pain: Pain Assessment Pain Assessment: 0-10 Pain Score: 0-No pain  See Function Navigator for Current Functional Status.   Therapy/Group: Individual Therapy  Leroy Libman 10/21/2016, 12:02 PM

## 2016-10-22 ENCOUNTER — Inpatient Hospital Stay (HOSPITAL_COMMUNITY): Payer: Medicare Other | Admitting: Physical Therapy

## 2016-10-22 ENCOUNTER — Inpatient Hospital Stay (HOSPITAL_COMMUNITY): Payer: Medicare Other

## 2016-10-22 ENCOUNTER — Inpatient Hospital Stay (HOSPITAL_COMMUNITY): Payer: Medicare Other | Admitting: Speech Pathology

## 2016-10-22 DIAGNOSIS — R1312 Dysphagia, oropharyngeal phase: Secondary | ICD-10-CM

## 2016-10-22 LAB — GLUCOSE, CAPILLARY
GLUCOSE-CAPILLARY: 139 mg/dL — AB (ref 65–99)
Glucose-Capillary: 162 mg/dL — ABNORMAL HIGH (ref 65–99)
Glucose-Capillary: 53 mg/dL — ABNORMAL LOW (ref 65–99)
Glucose-Capillary: 95 mg/dL (ref 65–99)
Glucose-Capillary: 159 mg/dL — ABNORMAL HIGH (ref 65–99)

## 2016-10-22 NOTE — Progress Notes (Signed)
[place] PHYSICAL MEDICINE & REHABILITATION     PROGRESS NOTE    Subjective/Complaints: Lying in bed. No new issues. Denies pain   ROS: pt denies nausea, vomiting, diarrhea, cough, shortness of breath or chest pain    Objective: Vital Signs: Blood pressure (!) 124/55, pulse 81, temperature 98.6 F (37 C), temperature source Oral, resp. rate 16, height 5\' 10"  (1.778 m), weight 76.4 kg (168 lb 6.4 oz), SpO2 93 %. No results found.  Recent Labs  10/20/16 0436  WBC 7.9  HGB 13.1  HCT 38.2*  PLT 188   No results for input(s): NA, K, CL, GLUCOSE, BUN, CREATININE, CALCIUM in the last 72 hours.  Invalid input(s): CO CBG (last 3)   Recent Labs  10/21/16 1623 10/21/16 2143 10/22/16 0645  GLUCAP 148* 190* 139*    Wt Readings from Last 3 Encounters:  10/20/16 76.4 kg (168 lb 6.4 oz)  10/16/16 75 kg (165 lb 5.5 oz)  09/03/16 79.4 kg (175 lb)    Physical Exam:  Constitutional: He appears well-developed and well-nourished.  HENT:  Head: Normocephalic and atraumatic.  Eyes: EOM are normal. Right eye exhibits no discharge. Left eye exhibits no discharge.  Neck: Normal range of motion. Neck supple. No thyromegaly present.  Cardiovascular: RRR without murmur. No JVD   Respiratory: CTA Bilaterally without wheezes or rales. Normal effort GI: Soft. Bowel sounds are normal. He exhibits no distension.  Musculoskeletal: He exhibits no edema or tenderness.  Neurological: He is alert.  Oriented to self and hospital. remains distracted.  Follows simple commands  Motor: 4-4+/5 through all 4's.  DTRs 3+ RUE/RLE.   Skin: Skin is warm and dry.  Psychiatric:  Pleasantly confused    Assessment/Plan: 1. Functional and cognitive deficits secondary to encephalopathy and recent right PICA infarct which require 3+ hours per day of interdisciplinary therapy in a comprehensive inpatient rehab setting. Physiatrist is providing close team supervision and 24 hour management of active  medical problems listed below. Physiatrist and rehab team continue to assess barriers to discharge/monitor patient progress toward functional and medical goals.  Function:  Bathing Bathing position   Position: Wheelchair/chair at sink  Bathing parts Body parts bathed by patient: Right arm, Left arm, Right upper leg, Left upper leg, Chest, Abdomen Body parts bathed by helper: Front perineal area, Buttocks, Right lower leg, Left lower leg, Back  Bathing assist Assist Level: Touching or steadying assistance(Pt > 75%)      Upper Body Dressing/Undressing Upper body dressing   What is the patient wearing?: Pull over shirt/dress     Pull over shirt/dress - Perfomed by patient: Thread/unthread right sleeve, Thread/unthread left sleeve, Put head through opening Pull over shirt/dress - Perfomed by helper: Pull shirt over trunk        Upper body assist Assist Level: Touching or steadying assistance(Pt > 75%)      Lower Body Dressing/Undressing Lower body dressing   What is the patient wearing?: Underwear, Pants, Non-skid slipper socks   Underwear - Performed by helper: Thread/unthread right underwear leg, Thread/unthread left underwear leg, Pull underwear up/down   Pants- Performed by helper: Thread/unthread right pants leg, Thread/unthread left pants leg, Pull pants up/down, Fasten/unfasten pants   Non-skid slipper socks- Performed by helper: Don/doff right sock, Don/doff left sock                  Lower body assist Assist for lower body dressing:  (Total Assist)      Toileting Toileting Toileting activity did not  occur: No continent bowel/bladder event   Toileting steps completed by helper: Adjust clothing prior to toileting, Performs perineal hygiene, Adjust clothing after toileting Toileting Assistive Devices: Grab bar or rail  Toileting assist Assist level: Two helpers   Transfers Chair/bed transfer   Chair/bed transfer Junction transfer assist  level: 2 helpers Chair/bed transfer assistive device: Bedrails, Armrests Mechanical lift: Ecologist Ambulation activity did not occur: Safety/medical concerns   Max distance: 7ft Assist level: Moderate assist (Pt 50 - 74%)   Wheelchair   Type: Manual Max wheelchair distance: 75 Assist Level: Touching or steadying assistance (Pt > 75%)  Cognition Comprehension Comprehension assist level: Understands basic less than 25% of the time/ requires cueing >75% of the time  Expression Expression assist level: Expresses basis less than 25% of the time/requires cueing >75% of the time.  Social Interaction Social Interaction assist level: Interacts appropriately 25 - 49% of time - Needs frequent redirection.  Problem Solving Problem solving assist level: Solves basic less than 25% of the time - needs direction nearly all the time or does not effectively solve problems and may need a restraint for safety  Memory Memory assist level: Recognizes or recalls less than 25% of the time/requires cueing greater than 75% of the time   Medical Problem List and Plan: 1.  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   -continue CIR therapies  -working towards min-mod assist goals 2.  DVT Prophylaxis/Anticoagulation: Subcutaneous Lovenox. Monitor platelet counts of any signs of bleeding 3. Pain Management: Tylenol as needed 4. Mood: Provide emotional support 5. Neuropsych: This patient Is not capable of making decisions on his own behalf. 6. Skin/Wound Care: Routine skin checks 7. Fluids/Electrolytes/Nutrition:   -continue to encourage po   8. Diabetes mellitus with peripheral neuropathy. Hemoglobin A1c 7.5. NovoLog 3 units 3 times a day with meals,   -intake remains somewhat inconsistent    -decreased lantus insulin  to 10 units  qam with some  improvement. Will hold on increase until eating more regularly  -continue metformin    9. CKD stage III.  Labs stable 10. CAD with pacemaker. Continue aspirin. No chest pain or shortness of breath 11. Chronic systolic congestive heart failure. Monitor for any signs of fluid overload   -daily weights 12. Hypertension. No current antihypertensive medications. Close monitoring with with mobility 13. Tick exposure.RMSF negative. Completing course of doxycycline--thru 6/16  LOS (Days) 4 A FACE TO FACE EVALUATION WAS PERFORMED  Meredith Staggers, MD 10/22/2016 9:37 AM

## 2016-10-22 NOTE — Progress Notes (Signed)
Speech Language Pathology Daily Session Note  Patient Details  Name: [name] MRN: [id number] Date of Birth: [birth year]  Today's Date: 10/22/2016 SLP Individual Time: 0900-1000 SLP Individual Time Calculation (min): 60 min  Short Term Goals: Week 1: SLP Short Term Goal 1 (Week 1): Pt will consume dys 1 textures and thin liquids with min assist verbal cues for use of swallowing precautions and minimal overt s/s of aspiration  SLP Short Term Goal 1 - Progress (Week 1): Revised due to lack of progress SLP Short Term Goal 2 (Week 1): Pt will maintain alertness for 2 minute intervals with max assist multimodal cues.   SLP Short Term Goal 3 (Week 1): Pt will sustain his attention to task for 1 minute intervals with max assist multimodal cues for redirection.   SLP Short Term Goal 4 (Week 1): Pt will utilize external aids to orient to place, date, and situation with max assist multimodal cues.    Skilled Therapeutic Interventions: Skilled treatment session focused on dysphagia and cognition goals. SLP facilitated session by providing skilled observation of pt consuming dysphagia 1 breakfast tray with nectar thick liquids via cup. Pt was free of overt s/s of aspiration but require wait time between bites/boluses as pt is not able to be redirected to task. Pt also doesn't perceive need to eat. Pt continues to be poor candidate for diet upgrade to dysphagia 2 as he is frequently hyperverbal while attempting to eat. Pt is unable to sustain attention to tasks without Max A multimodal cues and is unable to demonstrate any orientation information. Pt was left upright in bed and all needs within reach. Continue per current plan of care.      Function:  Eating Eating   Modified Consistency Diet: Yes Eating Assist Level: Set up assist for;Supervision or verbal cues;Help with picking up utensils;Help managing cup/glass;Helper checks for pocketed food   Eating Set Up Assist For: Opening containers    Helper Brings Food to Mouth: Every scoop   Cognition Comprehension Comprehension assist level: Understands basic less than 25% of the time/ requires cueing >75% of the time  Expression   Expression assist level: Expresses basis less than 25% of the time/requires cueing >75% of the time.  Social Interaction Social Interaction assist level: Interacts appropriately 25 - 49% of time - Needs frequent redirection.  Problem Solving Problem solving assist level: Solves basic less than 25% of the time - needs direction nearly all the time or does not effectively solve problems and may need a restraint for safety  Memory Memory assist level: Recognizes or recalls less than 25% of the time/requires cueing greater than 75% of the time    Pain    Therapy/Group: Individual Therapy   Denice Cardon B. Rutherford Nail, M.S., [place] 10/22/2016, 3:52 PM

## 2016-10-22 NOTE — Progress Notes (Signed)
Physical Therapy Session Note  Patient Details  Name: TIGER SPIEKER MRN: [id number] Date of Birth: 03-23-1931  Today's Date: 10/22/2016 PT Individual Time: 1300-1355 PT Individual Time Calculation (min): 55 min   Short Term Goals: Week 1:  PT Short Term Goal 1 (Week 1): Pt will be able to transfer with mod assist PT Short Term Goal 2 (Week 1): Pt will be able to gait x 25' with max +2 PT Short Term Goal 3 (Week 1): Pt will be able to initiate stair negotiation  Skilled Therapeutic Interventions/Progress Updates:  Pt received seated in manual w/c in day room finishing lunch with nurse tech. Pt oriented to person but not place or time, pt states it is 1990. Total A to rehab gym in manual w/c. Pt mod A sit>stand with verbal and tactile cues for placement of UEs on RW and w/c and cues for anterior lean. Pt ambulated approximately 30 ft to sit in chair with min A and verbal cues for step length and avoidance of obstacles; noted decreased recurvatum of L LE during ambulation today. Pt also required less assistance to manage RW when turning to sit in chair but required verbal cues for management of RW and to maintain upright posture when turning. Pt with difficulty reaching back for armrest on chair/w/c before sitting. Pt performed step taps onto 4" step with L LE x 5 and mod A to maintain balance; verbal cues and manual facilitation for hip extension. Pt reports need to use the restroom but seemed disoriented during questioning for need to change brief. Pt ambulated 33ft from rehab gym out into hallway with min A using RW before re-stating that he needed to use the restroom. Total A in manual w/c to pt's room. Mod A sit>stand at sink; total A for peri hygiene, brief change, and to pull pants up/down. Stand pivot transfer from w/c>bed using RW mod A for management of RW and verbal cues for sequencing and to wait to sit until he feels the bed behind him.  Pt progressively more distractible with decreased  attention and arousal throughout session, especially toward end of session when asking pt to transition from sit>supine in bed. +2 A for sit>supine and reposition in bed. Pt left supine in bed with 3 rails, call bell in reach, and RN alerted to pt's state of arousal/attention.    Therapy Documentation Precautions:  Precautions Precautions: Fall, ICD/Pacemaker Restrictions Weight Bearing Restrictions: No   See Function Navigator for Current Functional Status.   Therapy/Group: Individual Therapy  Alysia Penna 10/22/2016, 3:07 PM

## 2016-10-22 NOTE — Progress Notes (Signed)
Occupational Therapy Session Note  Patient Details  Name: [name] MRN: [id number] Date of Birth: July 15, 1930  Today's Date: 10/22/2016 OT Individual Time: 1000-1110 OT Individual Time Calculation (min): 70 min    Short Term Goals: Week 1:  OT Short Term Goal 1 (Week 1): Pt will complete stand pivot transfer to toilet with [place].  OT Short Term Goal 2 (Week 1): Pt will demonstrate sustained attention for completing at least 2/3 seated grooming ADL tasks. OT Short Term Goal 3 (Week 1): Pt will complete LB dressing with ModA.  OT Short Term Goal 4 (Week 1): Pt will demonstrate dynamic sitting balance with MinA during functional task completion.   Skilled Therapeutic Interventions/Progress Updates:    Pt asleep in bed upon arrival.  Pt required mod verbal cues to arouse.  Pt continues to demonstrate confusion and disorientation to place, situation, or date.  Pt requires tot A for orientation.  Pt requires max verbal cues to initiate sitting EOB in preparation for transfer to w/c.  Pt required max multimodal cues to initiate transfer and sequencing.  Pt required max A for SPT transfer to w/c.  Attempted to engage pt in self care tasks.  Pt initiated/completed washing face and applying deodorant.  Pt demonstrated difficulty orienting shirt and pants.  Pt required tot A to complete dressing tasks.  Pt transitioned to gym.  Attempted to engage pt in simple tasks (threading large beads on string). Pt unable to initiate and required max multimodal cues to keep eyes open for task.  Pt returned to nursing station and remained in w/c with QRB in place.   Therapy Documentation Precautions:  Precautions Precautions: Fall, ICD/Pacemaker Restrictions Weight Bearing Restrictions: No  Pain: Pain Assessment Pain Assessment: 0-10 Pain Score: 0-No pain  See Function Navigator for Current Functional Status.   Therapy/Group: Individual Therapy  Leroy Libman 10/22/2016, 11:12 AM

## 2016-10-23 ENCOUNTER — Inpatient Hospital Stay (HOSPITAL_COMMUNITY): Payer: Medicare Other | Admitting: Speech Pathology

## 2016-10-23 ENCOUNTER — Inpatient Hospital Stay (HOSPITAL_COMMUNITY): Payer: Medicare Other | Admitting: Occupational Therapy

## 2016-10-23 ENCOUNTER — Inpatient Hospital Stay (HOSPITAL_COMMUNITY): Payer: Medicare Other | Admitting: Physical Therapy

## 2016-10-23 LAB — GLUCOSE, CAPILLARY
Glucose-Capillary: 105 mg/dL — ABNORMAL HIGH (ref 65–99)
Glucose-Capillary: 114 mg/dL — ABNORMAL HIGH (ref 65–99)
Glucose-Capillary: 169 mg/dL — ABNORMAL HIGH (ref 65–99)
Glucose-Capillary: 58 mg/dL — ABNORMAL LOW (ref 65–99)
Glucose-Capillary: 72 mg/dL (ref 65–99)
Glucose-Capillary: 74 mg/dL (ref 65–99)

## 2016-10-23 NOTE — Progress Notes (Addendum)
Physical Therapy Session Note  Patient Details  Name: [name] MRN: [id number] Date of Birth: May 29, 1930  Today's Date: 10/23/2016 PT Individual Time: 0730-0825 PT Individual Time Calculation (min): 55 min   Short Term Goals: Week 1:  PT Short Term Goal 1 (Week 1): Pt will be able to transfer with mod assist PT Short Term Goal 2 (Week 1): Pt will be able to gait x 25' with max +2 PT Short Term Goal 3 (Week 1): Pt will be able to initiate stair negotiation  Skilled Therapeutic Interventions/Progress Updates: Pt received supine, oriented upside down in bed reporting he doesn't know how he got turned around, with nurse tech present changing brief. MaxA rolling R/L with poor attention/initiation. Sidelying>sit with modA. Once up to EOB pt with significantly increased alertness/arousal, interacting with therapist and answering questions appropriately. Sit >stand in [place] modA. Transferred to w/c totalA in [place]. Seated in w/c pt engages in self feeding appropriately with setupA and minimal cueing for approximately 5 min before he begins falling asleep, requiring multimodal stimuli to remain awake and attend to task. Reports the year is 1980, no response when asked where he is. W/c propulsion x75' with minA d/t veering L with LUE strength/coordination deficits. Gait with RW x90' with minA, tactile cues at L knee to reduce recurvatum. Sitting and standing boxing with BUE; modA sit <>stand and min guard for balance however heavy reliance of LEs on mat table. Standing while boxing tolerance of approximately 30 seconds before fatigued and requiring seated rest break; performed standing x5 trials. Stand pivot transfer back to w/c modA without RW d/t pt impulsivity, max multimodal cues for problem solving to complete turn to sit. Pt very fatigued by end of session, requiring max verbal/tactile cueing to remain awake. Therapeutic use of music throughout session; pt self-selected "rock and roll" station on  [place]. Handoff to SLP at end of session.      Therapy Documentation Precautions:  Precautions Precautions: Fall, ICD/Pacemaker Restrictions Weight Bearing Restrictions: No  See Function Navigator for Current Functional Status.   Therapy/Group: Individual Therapy  Luberta Mutter 10/23/2016, 8:27 AM

## 2016-10-23 NOTE — Progress Notes (Signed)
Speech Language Pathology Daily Session Note  Patient Details  Name: [name] MRN: [id number] Date of Birth: 06-Jul-1930  Today's Date: 10/23/2016 SLP Individual Time: 0830-0900 SLP Individual Time Calculation (min): 30 min  Short Term Goals: Week 1: SLP Short Term Goal 1 (Week 1): Pt will consume dys 1 textures and thin liquids with min assist verbal cues for use of swallowing precautions and minimal overt s/s of aspiration  SLP Short Term Goal 1 - Progress (Week 1): Discontinued (comment) (Pt with overt s/s of aspiration with thin liquids) SLP Short Term Goal 2 (Week 1): Pt will maintain alertness for 2 minute intervals with max assist multimodal cues.   SLP Short Term Goal 3 (Week 1): Pt will sustain his attention to task for 1 minute intervals with max assist multimodal cues for redirection.   SLP Short Term Goal 4 (Week 1): Pt will utilize external aids to orient to place, date, and situation with max assist multimodal cues.   SLP Short Term Goal 5 (Week 1): Pt will consume dys 1 textures and nectar liquids with Max assist verbal cues for use of swallowing precautions and minimal overt s/s of aspiration   Skilled Therapeutic Interventions: Skilled treatment session focused on cognition and dysphagia goals. SLP facilitated session by providing Total A for arousal and pt able to maintain brief period of arousal (<15 second intervals). During these periods, pt held empty cup and was able to be feed boluses of nectar thick liquids without overt s/s of aspiration. One attempt of puree lead to holding and Total A cues to clear. Pt's significant cognitive deficits continue to impact PO safety. Pt returned to nursing station for increased supervision. Continue per current plan of care.      Function:  Eating Eating   Modified Consistency Diet: Yes Eating Assist Level: Set up assist for;Supervision or verbal cues;Help with picking up utensils;Help managing cup/glass;Helper checks for pocketed  food   Eating Set Up Assist For: Opening containers   Helper Brings Food to Mouth: Every scoop   Cognition Comprehension Comprehension assist level: Understands basic less than 25% of the time/ requires cueing >75% of the time  Expression   Expression assist level: Expresses basis less than 25% of the time/requires cueing >75% of the time.  Social Interaction Social Interaction assist level: Interacts appropriately 25 - 49% of time - Needs frequent redirection.  Problem Solving Problem solving assist level: Solves basic less than 25% of the time - needs direction nearly all the time or does not effectively solve problems and may need a restraint for safety  Memory Memory assist level: Recognizes or recalls less than 25% of the time/requires cueing greater than 75% of the time    Pain    Therapy/Group: Individual Therapy  Aaylah Pokorny 10/23/2016, 8:47 AM

## 2016-10-23 NOTE — Progress Notes (Signed)
Occupational Therapy Session Note  Patient Details  Name: [name] MRN: [id number] Date of Birth: August 26, 1930  Today's Date: 10/23/2016 OT Individual Time: 1194-1740 and 8144-8185 OT Individual Time Calculation (min): 31 min and 42 min   Short Term Goals: Week 1:  OT Short Term Goal 1 (Week 1): Pt will complete stand pivot transfer to toilet with [name].  OT Short Term Goal 2 (Week 1): Pt will demonstrate sustained attention for completing at least 2/3 seated grooming ADL tasks. OT Short Term Goal 3 (Week 1): Pt will complete LB dressing with ModA.  OT Short Term Goal 4 (Week 1): Pt will demonstrate dynamic sitting balance with MinA during functional task completion.     Skilled Therapeutic Interventions/Progress Updates:    Tx focus on following 1 step instructions and endurance during functional tasks.   Pt greeted at [employer] station. He was escorted to room, pt adamant that it was not his room, reporting that clothes in drawers were not his. Therefore he refused to complete ADLs. Tried to engage him in w/c obstacle course in dayroom, with unable to self propel w/c with [facility] assist. Pt washing/drying tables in dayroom with [facility] assist to help him initiate. Pt able to keep eyes open and maintain attention to task for 1 minute before requiring cues to continue. Therapeutic use of music (pt likes rock n' roll) utilized during session to increase alertness. Pt smiling and tapping hand to beat of music on leg. After approx 30 minutes of session, pt was unable to keep eyes open for more than 5 seconds. He stopped following 1 step instructions/actively participating. Therefore tx ended early due to pt sleepiness/decreased alertness. He was returned to RN station at end of session.  29 minutes missed.   2nd Session 1:1 tx (42 min) Tx focus on attention, awareness, and balance during self care tasks.   Pt greeted supine in bed, asleep, easily woken. He was agreeable to tx, supine<sit with Min Patrick. Stand  pivot transfer to w/c completed with Max Patrick, step by step cues provided for sequencing. During transfer, foul smell emanated from brief. Hygiene completed with pt standing at sink for 3 minute intervals. Pt continuing to have BM during clean up. He actively assisted with pericare, though spreading feces onto hospital gown in the process. Handwashing completed, pt trying to use lotion as soap. He then completed LB dressing with overall Mod Patrick, removing bilateral support for lifting pants over hips. Max Patrick sit<stand.Pt requiring Min Patrick and cues for proper orientation of shirt. He initially stuffed shirt down front of pants. At end of tx pt was escorted to RN station. Safety belt donned. Pt positioned to maximize social participation (with 2 other residents present). Requested music playing for residents with use of therapy ipad at time of departure.   Therapy Documentation Precautions:  Precautions Precautions: Fall, ICD/Pacemaker Restrictions Weight Bearing Restrictions: No   Pain: Pain Assessment Pain Assessment: No/denies pain ADL: ADL ADL Comments: see functional navigator  :    See Function Navigator for Current Functional Status.   Therapy/Group: Individual Therapy  [name] [name] 10/23/2016, 12:29 PM

## 2016-10-23 NOTE — Progress Notes (Signed)
Hypoglycemic Event  CBG: 58  Treatment: 15 GM carbohydrate snack  Symptoms: None  Follow-up CBG: Time:1820 CBG Result:72  Possible Reasons for Event: Unknown  Comments/MD notified:Followed hypoglycemic protocol    Rashawd Laskaris, [place]

## 2016-10-23 NOTE — Progress Notes (Signed)
  [place] PHYS[facility] MEDICINE & REHABILITATION     PROGRESS NOTE    Subjective/Complaints: Lying in bed, recently changed. No other complaints  Objective: Vital Signs: Blood pressure 128/77, pulse 75, temperature 98 F (36.7 C), temperature source Oral, resp. rate 18, height 5\' 10"  (1.778 m), weight 168 lb 6.4 oz (76.4 kg), SpO2 98 %.   Physical Exam:   elderly male in no acute distress. HEENT exam atraumatic, normocephalic, neck supple without jugular venous distention. Chest clear to auscultation cardiac exam S1-S2 are regular. Abdominal exam with bowel sounds, soft and nontender. Extremities no edema. Neurologic exam is alert but confused.     Assessment/Plan: 1. Functional and cognitive deficits secondary to encephalopathy and recent right PICA infarct  Medical Problem List and Plan: 1.  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   - CIR  -goal- min/mod assist.  2.  DVT Prophylaxis/Anticoagulation: Subcutaneous Lovenox.  Lab Results  Component Value Date   PLT 188 10/20/2016   3. Pain Management: Tylenol as needed 4. Mood: Provide emotional support 5. Neuropsych: This patient Is not capable of making decisions on his own behalf. 6. Skin/Wound Care: Routine skin checks 7. Fluids/Electrolytes/Nutrition:    Basic Metabolic Panel:    Component Value Date/Time   NA 138 10/19/2016 0539   K 3.7 10/19/2016 0539   CL 105 10/19/2016 0539   CO2 22 10/19/2016 0539   BUN 25 (H) 10/19/2016 0539   CREATININE 1.03 10/19/2016 0539   GLUCOSE 44 (LL) 10/19/2016 0539   CALCIUM 9.4 10/19/2016 0539   8. Diabetes mellitus with peripheral neuropathy. Hemoglobin A1c 7.5. CBG (last 3)   Recent Labs  10/22/16 1618 10/22/16 2048 10/23/16 0645  GLUCAP 95 159* 105*   Reasonable control He does have peripheral neuropathy   9. CKD stage III.   Labs stable 10. CAD with pacemaker. Continue aspirin. No chest pain or shortness of breath 11. Chronic systolic congestive heart failure. Monitor for any signs of fluid overload   -daily weights 12. Hypertension. BP good with no meds.  13. Tick exposure.completing doxy through 6/16  LOS (Days) 5 A FACE TO FACE EVALUATION WAS PERFORMED  Lisabeth Pick, MD 10/23/2016 8:58 AM

## 2016-10-24 LAB — GLUCOSE, CAPILLARY
GLUCOSE-CAPILLARY: 188 mg/dL — AB (ref 65–99)
Glucose-Capillary: 98 mg/dL (ref 65–99)
Glucose-Capillary: 91 mg/dL (ref 65–99)
Glucose-Capillary: 139 mg/dL — ABNORMAL HIGH (ref 65–99)

## 2016-10-24 MED ORDER — METFORMIN HCL 500 MG PO TABS
250.0000 mg | ORAL_TABLET | Freq: Two times a day (BID) | ORAL | Status: DC
  Administered 2016-10-24 – 2016-10-30 (×13): 250 mg via ORAL
  Filled 2016-10-24 (×13): qty 1

## 2016-10-24 NOTE — Progress Notes (Signed)
  [facility]ICAL MEDICINE & REHABILITATION     PROGRESS NOTE    Subjective/Complaints: Feels well this morning.  No complaints Eating well  Objective: Vital Signs: Blood pressure 127/77, pulse 78, temperature 98 F (36.7 C), temperature source Oral, resp. rate 18, height 5\' 10"  (1.778 m), weight 168 lb 6.4 oz (76.4 kg), SpO2 100 %.   Physical Exam:  elderly male in no acute distress. HEENT exam atraumatic, normocephalic, neck supple without jugular venous distention. Chest clear to auscultation cardiac exam S1-S2 are regular. Abdominal exam with bowel sounds, soft and nontender. Extremities no edema. Neurologic exam is alert and talkative     Assessment/Plan: 1. Functional and cognitive deficits secondary to encephalopathy and recent right PICA infarct  Medical Problem List and Plan: 1.  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   - continue CIR 2.  DVT Prophylaxis/Anticoagulation: Subcutaneous Lovenox.  Lab Results  Component Value Date   PLT 188 10/20/2016   3. Pain Management: Tylenol as needed 4. Mood: Provide emotional support 5. Neuropsych: This patient Is not capable of making decisions on his own behalf. 6. Skin/Wound Care: Routine skin checks 7. Fluids/Electrolytes/Nutrition:    Basic Metabolic Panel:    Component Value Date/Time   NA 138 10/19/2016 0539   K 3.7 10/19/2016 0539   CL 105 10/19/2016 0539   CO2 22 10/19/2016 0539   BUN 25 (H) 10/19/2016 0539   CREATININE 1.03 10/19/2016 0539   GLUCOSE 44 (LL) 10/19/2016 0539   CALCIUM 9.4 10/19/2016 0539   8. Diabetes mellitus with peripheral neuropathy. Hemoglobin A1c 7.5. CBG (last 3)   Recent Labs  10/23/16 2036 10/23/16 2351 10/24/16 0640  GLUCAP 114* 3 98   Well controlled He does have peripheral neuropathy   9. CKD stage III.  Labs stable 10.  CAD with pacemaker. Continue aspirin. No chest pain or shortness of breath 11. Chronic systolic congestive heart failure. Monitor for any signs of fluid overload   -daily weights 12. Hypertension. 122/78-127/77- currently no CV meds  13. Tick exposure.completed Doxy on 6/16 LOS (Days) 6 A FACE TO FACE EVALUATION WAS PERFORMED  Lisabeth Pick, MD 10/24/2016 8:55 AM

## 2016-10-24 NOTE — Progress Notes (Signed)
Patient showed no signs of any distress during this shift. Even unlabored respirations.

## 2016-10-24 NOTE — Plan of Care (Signed)
Problem: RH BOWEL ELIMINATION Goal: RH STG MANAGE BOWEL WITH ASSISTANCE STG Manage Bowel with mod Assistance.  Outcome: Not Progressing Total assist Goal: RH STG MANAGE BOWEL W/MEDICATION W/ASSISTANCE STG Manage Bowel with Medication with mod Assistance.  Outcome: Not Progressing Total assist  Problem: RH BLADDER ELIMINATION Goal: RH STG MANAGE BLADDER WITH ASSISTANCE STG Manage Bladder With mod Assistance  Outcome: Not Progressing Total assist

## 2016-10-25 ENCOUNTER — Inpatient Hospital Stay (HOSPITAL_COMMUNITY): Payer: Medicare Other | Admitting: Speech Pathology

## 2016-10-25 ENCOUNTER — Inpatient Hospital Stay (HOSPITAL_COMMUNITY): Payer: Medicare Other | Admitting: *Deleted

## 2016-10-25 ENCOUNTER — Inpatient Hospital Stay (HOSPITAL_COMMUNITY): Payer: Medicare Other

## 2016-10-25 ENCOUNTER — Inpatient Hospital Stay (HOSPITAL_COMMUNITY): Payer: Medicare Other | Admitting: Occupational Therapy

## 2016-10-25 ENCOUNTER — Inpatient Hospital Stay (HOSPITAL_COMMUNITY): Payer: Medicare Other | Admitting: Physical Therapy

## 2016-10-25 LAB — GLUCOSE, CAPILLARY
GLUCOSE-CAPILLARY: 119 mg/dL — AB (ref 65–99)
Glucose-Capillary: 131 mg/dL — ABNORMAL HIGH (ref 65–99)
Glucose-Capillary: 175 mg/dL — ABNORMAL HIGH (ref 65–99)

## 2016-10-25 NOTE — Progress Notes (Signed)
[facility] PHYSICAL MEDICINE & REHABILITATION     PROGRESS NOTE    Subjective/Complaints: Up in bed. Eating breakfast. No new complaints    Objective: Vital Signs: Blood pressure 125/77, pulse 80, temperature 97.9 F (36.6 C), temperature source Oral, resp. rate 18, height 5\' 10"  (1.778 m), weight 76.4 kg (168 lb 6.4 oz), SpO2 98 %. No results found. No results for input(s): WBC, HGB, HCT, PLT in the last 72 hours. No results for input(s): NA, K, CL, GLUCOSE, BUN, CREATININE, CALCIUM in the last 72 hours.  Invalid input(s): CO CBG (last 3)   Recent Labs  10/24/16 1659 10/24/16 2055 10/25/16 0645  GLUCAP 139* 188* 131*    Wt Readings from Last 3 Encounters:  10/20/16 76.4 kg (168 lb 6.4 oz)  10/16/16 75 kg (165 lb 5.5 oz)  09/03/16 79.4 kg (175 lb)    Physical Exam:  Constitutional: He appears well-developed and well-nourished.  HENT:  Head: Normocephalic and atraumatic.  Eyes: EOM are normal. Right eye exhibits no discharge. Left eye exhibits no discharge.  Neck: Normal range of motion. Neck supple. No thyromegaly present.  Cardiovascular: RRR without murmur. No JVD   Respiratory: CTA Bilaterally without wheezes or rales. Normal effort  GI: Soft. Bowel sounds are normal. He exhibits no distension.  Musculoskeletal: He exhibits no edema or tenderness.  Neurological: He is alert and aware of surroundings. Follows commands.  Motor: 4-4+/5 through all 4's.  DTRs 3+ RUE/RLE.   Skin: Skin is warm and dry.  Psychiatric:  Pleasantly confused    Assessment/Plan: 1. Functional and cognitive deficits secondary to encephalopathy and recent right PICA infarct which require 3+ hours per day of interdisciplinary therapy in a comprehensive inpatient rehab setting. Physiatrist is providing close team supervision and 24 hour management of active medical problems listed below. Physiatrist and rehab team continue to assess barriers to discharge/monitor patient progress toward  functional and medical goals.  Function:  Bathing Bathing position   Position: Wheelchair/chair at sink  Bathing parts Body parts bathed by patient: Right arm, Left arm, Chest, Abdomen Body parts bathed by helper: Front perineal area, Buttocks, Right upper leg, Left upper leg, Right lower leg, Left lower leg  Bathing assist Assist Level: Touching or steadying assistance(Pt > 75%)      Upper Body Dressing/Undressing Upper body dressing   What is the patient wearing?: Pull over shirt/dress     Pull over shirt/dress - Perfomed by patient: Thread/unthread right sleeve, Put head through opening, Pull shirt over trunk Pull over shirt/dress - Perfomed by helper: Thread/unthread left sleeve        Upper body assist Assist Level: Touching or steadying assistance(Pt > 75%)      Lower Body Dressing/Undressing Lower body dressing   What is the patient wearing?: Pants, Non-skid slipper socks   Underwear - Performed by helper: Thread/unthread right underwear leg, Thread/unthread left underwear leg, Pull underwear up/down Pants- Performed by patient: Pull pants up/down Pants- Performed by helper: Thread/unthread right pants leg, Thread/unthread left pants leg   Non-skid slipper socks- Performed by helper: Don/doff right sock, Don/doff left sock                  Lower body assist Assist for lower body dressing:  (Total Assist)      Toileting Toileting Toileting activity did not occur: No continent bowel/bladder event   Toileting steps completed by helper: Adjust clothing prior to toileting, Performs perineal hygiene, Adjust clothing after toileting Toileting Assistive Devices: Grab bar or  rail  Toileting assist Assist level: Two helpers   Transfers Chair/bed transfer   Chair/bed transfer method: Stand pivot Chair/bed transfer assist level: Moderate assist (Pt 50 - 74%/lift or lower) Chair/bed transfer assistive device: Armrests, Walker Mechanical lift: Environmental manager activity did not occur: Safety/medical concerns   Max distance: 90 Assist level: Touching or steadying assistance (Pt > 75%)   Wheelchair   Type: Manual Max wheelchair distance: 75 Assist Level: Touching or steadying assistance (Pt > 75%)  Cognition Comprehension Comprehension assist level: Understands basic 25 - 49% of the time/ requires cueing 50 - 75% of the time  Expression Expression assist level: Expresses basic 25 - 49% of the time/requires cueing 50 - 75% of the time. Uses single words/gestures.  Social Interaction Social Interaction assist level: Interacts appropriately 25 - 49% of time - Needs frequent redirection.  Problem Solving Problem solving assist level: Solves basic 25 - 49% of the time - needs direction more than half the time to initiate, plan or complete simple activities  Memory Memory assist level: Recognizes or recalls 25 - 49% of the time/requires cueing 50 - 75% of the time   Medical Problem List and Plan: 1.  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   -continue CIR therapies   2.  DVT Prophylaxis/Anticoagulation: Subcutaneous Lovenox. Monitor platelet counts of any signs of bleeding 3. Pain Management: Tylenol as needed 4. Mood: Provide emotional support 5. Neuropsych: This patient Is not capable of making decisions on his own behalf. 6. Skin/Wound Care: Routine skin checks 7. Fluids/Electrolytes/Nutrition:   -continue to encourage po   8. Diabetes mellitus with peripheral neuropathy. Hemoglobin A1c 7.5.    -reasonable control  -intake looked better over weekend    -decreased lantus insulin  to 10 units  qam with  improvement.    -continue metformin at current dose    9. CKD stage III.  Labs stable 10. CAD with pacemaker. Continue aspirin. No chest pain or shortness of  breath 11. Chronic systolic congestive heart failure. Monitor for any signs of fluid overload   -daily weights===need these checked 12. Hypertension. No current antihypertensive medications. Close monitoring with with mobility 13. Tick exposure.RMSF negative. Completing course of doxycycline--completed  LOS (Days) [place] T, MD 10/25/2016 8:35 AM

## 2016-10-25 NOTE — Progress Notes (Signed)
Occupational Therapy Session Note  Patient Details  Name: [name] MRN: [id number] Date of Birth: [birth year]  Today's Date: 10/25/2016 OT Individual Time: 1100-1130 OT Individual Time Calculation (min): 30 min  and Today's Date: 10/25/2016 OT Missed Time: 30 Minutes Missed Time Reason: Patient fatigue   Short Term Goals: Week 1:  OT Short Term Goal 1 (Week 1): Pt will complete stand pivot transfer to toilet with [place].  OT Short Term Goal 2 (Week 1): Pt will demonstrate sustained attention for completing at least 2/3 seated grooming ADL tasks. OT Short Term Goal 3 (Week 1): Pt will complete LB dressing with ModA.  OT Short Term Goal 4 (Week 1): Pt will demonstrate dynamic sitting balance with MinA during functional task completion.   Skilled Therapeutic Interventions/Progress Updates:    Pt asleep in w/c at nursing station upon arrival.  Pt opened eyes and assisted with repositioning in w/c before transitioning to therapy gym.  Attempted to engage pt in sit<>stand and standing task but patient unable to keep eyes open and attend to request.  Attempted to initiate sit<>stand but pt made not attempt to assist and resisted physical assistance.  Pt returned to room and bed.  Pt remained in bed with all needs within reach and bed alarm activated.  Pt missed 30 mins skilled OT services 2/2 fatigue.  Therapy Documentation Precautions:  Precautions Precautions: Fall, ICD/Pacemaker Restrictions Weight Bearing Restrictions: No General: General OT Amount of Missed Time: 30 Minutes Pain:  Pt denies pain  See Function Navigator for Current Functional Status.   Therapy/Group: Individual Therapy  Leroy Libman 10/25/2016, 11:53 AM

## 2016-10-25 NOTE — Progress Notes (Signed)
Speech Language Pathology Daily Session Note  Patient Details  Name: CLARENCE COGSWELL MRN: [id number] Date of Birth: May 20, 1930  Today's Date: 10/25/2016 SLP Individual Time: 1093-2355 SLP Individual Time Calculation (min): 30 min  Short Term Goals: Week 1: SLP Short Term Goal 1 (Week 1): Pt will consume dys 1 textures and thin liquids with min assist verbal cues for use of swallowing precautions and minimal overt s/s of aspiration  SLP Short Term Goal 1 - Progress (Week 1): Discontinued (comment) (Pt with overt s/s of aspiration with thin liquids) SLP Short Term Goal 2 (Week 1): Pt will maintain alertness for 2 minute intervals with max assist multimodal cues.   SLP Short Term Goal 3 (Week 1): Pt will sustain his attention to task for 1 minute intervals with max assist multimodal cues for redirection.   SLP Short Term Goal 4 (Week 1): Pt will utilize external aids to orient to place, date, and situation with max assist multimodal cues.   SLP Short Term Goal 5 (Week 1): Pt will consume dys 1 textures and nectar liquids with Max assist verbal cues for use of swallowing precautions and minimal overt s/s of aspiration   Skilled Therapeutic Interventions: Skilled treatment session focused on dysphagia and cognition goals. SLP facilitated session by providing skilled observation of pt consuming dysphagia 1 lunch tray with nectar thick liquids via cup. Pt required Total A to maintain alertness for ~ 30 seconds. Pt given brief trials of thin liquids via cup with immediate coughing, multiple swallows and wet voice present.  During these periods of arousal, pt given small boluses of puree or sips of nectar. Pt held or perseveratively chewed each bolus for > 30 seconds despite Total A. Pt's cognitive attention to bolus appears much worse day. On last bolus, pt held bolus for > 1 minute. No further PO given. Education provided to nursing, PA and CSW on decreased intake and cognitive impact on swallow function.  Additionally, pt's niece was present for session and pt with no interaction with her as his niece. Pt was left upright in wheelchair with niece. Continue per current plan of care.      Function:  Eating Eating   Modified Consistency Diet: Yes Eating Assist Level: Set up assist for;Supervision or verbal cues;Help managing cup/glass;Helper brings food to mouth;Helper checks for pocketed food   Eating Set Up Assist For: Opening containers   Helper Brings Food to Mouth: Every scoop   Cognition Comprehension Comprehension assist level: Understands basic less than 25% of the time/ requires cueing >75% of the time  Expression   Expression assist level: Expresses basis less than 25% of the time/requires cueing >75% of the time.  Social Interaction Social Interaction assist level: Interacts appropriately less than 25% of the time. May be withdrawn or combative.  Problem Solving Problem solving assist level: Solves basic less than 25% of the time - needs direction nearly all the time or does not effectively solve problems and may need a restraint for safety  Memory Memory assist level: Recognizes or recalls less than 25% of the time/requires cueing greater than 75% of the time    Pain    Therapy/Group: Individual Therapy  Hiroki Wint 10/25/2016, 4:24 PM

## 2016-10-25 NOTE — Progress Notes (Signed)
Physical Therapy Note  Patient Details  Name: [name] MRN: [id number] Date of Birth: Apr 10, 1931 Today's Date: 10/25/2016    Pt's plan of care adjusted to 15/7 after speaking with care team and discussed with PA as pt currently unable to tolerate current therapy schedule with OT, PT, and SLP.     Waunita Schooner 10/25/2016, 1:05 PM

## 2016-10-25 NOTE — Progress Notes (Addendum)
Physical Therapy Session Note  Patient Details  Name: [name] MRN: [id number] Date of Birth: 09/18/1930  Today's Date: 10/25/2016 PT Individual Time: 0370-4888 PT Individual Time Calculation (min): 39 min   Short Term Goals: Week 1:  PT Short Term Goal 1 (Week 1): Pt will be able to transfer with mod assist PT Short Term Goal 2 (Week 1): Pt will be able to gait x 25' with max +2 PT Short Term Goal 3 (Week 1): Pt will be able to initiate stair negotiation  Skilled Therapeutic Interventions/Progress Updates:  Pt received asleep in bed but easily awakened. Pt transferred supine>sitting EOB with mod assist, bed rails, and HOB elevated. Pt requires max multimodal cuing to attempt bed>w/c transfer but pt appears to be fearful of anterior weight shifting, impaired motor planning, and inability to follow commands. Pt able to transfer sit>stand x 1 occasion but with significant posterior lean & returned to sitting on EOB. Eventually pt able to transfer sit>stand from elevated surface and complete squat pivot bed>w/c with max assist. Transported pt to [facility] gym via w/c total assist & pt engaged in dynavision activity from w/c level with task focusing on anterior weight shifting & attention to task; pt able to attend to task for ~2 seconds before requiring cuing from therapist. Pt's niece Arbie Cookey) arrived & pt initially unable to recall her name. Educated Arbie Cookey on benefits of engaging pt in orienting conversation. Throughout entire session pt required max multimodal cuing to stay awake, as he would easily fall asleep during tasks. At end of session pt left sitting in w/c with QRB donned & in dayroom with niece present to supervise.   Pt with very poor memory, as he re-introduced himself to therapist ~15 minutes after initial arrival, with no apparent recall of meeting the first time.  Therapy Documentation Precautions:  Precautions Precautions: Fall, ICD/Pacemaker Restrictions Weight Bearing  Restrictions: No   Pain: No c/o pain noted.  See Function Navigator for Current Functional Status.   Therapy/Group: Individual Therapy  Waunita Schooner 10/25/2016, 3:52 PM

## 2016-10-25 NOTE — Progress Notes (Signed)
Physical Therapy Session Note  Patient Details  Name: [name] MRN: [id number] Date of Birth: 01-15-1931  Today's Date: 10/25/2016 PT Individual Time: 6269-4854 and 1450-1535 PT Individual Time Calculation (min): 57 min and 54min   Short Term Goals: Week 1:  PT Short Term Goal 1 (Week 1): Pt will be able to transfer with mod assist PT Short Term Goal 2 (Week 1): Pt will be able to gait x 25' with max +2 PT Short Term Goal 3 (Week 1): Pt will be able to initiate stair negotiation  Skilled Therapeutic Interventions/Progress Updates:    First Tx focused on functional mobility training, gait with RW, and NMR via cognitive remediation, forced use, manual facilitation, and multi-modal cues. Pt sleeping in bed uon arrival.  Max A for rolling and supine>sit with multi-modal cues for initiation and sequence. Pt sat EOB x27min during functional activity and balance tasks with facilitation for postural control.  Aftermultiple attempts at stand-step and sit<.stands with max A, pt needed +2 to stand due to strong posterior lean x3 throughout tx with multi-modal cues for technique.  Gait to sink with RW and Mod A for trunk control. Pt stood at sink with bil E support x63min for clening/changing with min A for trunk control.  Gait 1x50' with RW in hall with strong Mo, unsafe to continue/. Pt left up at RN station. d A for forward progress and weight shifting, manual facilitation for hip ext and upright posture. Attempted anpother walk with +2, but pt forably pushing posteriorly with feetl slipping out Cognitive remediation throughout for initiation, sustained attention to task, and orientation. Pt showed no signs of orientation and attention limited ~5seconds.   Second tx focused on arousal during tx, cognitive remediation, gait with RW, and sit<>stand transfers. Niece present and supportive.  Sit<>stand in standing frame x38min with various reaching/placing tasks in any direction, attempting to sustained  eyes open and following single-step commands. Increased time and processing with simple commands provided. Pt able to perform 30% of reaching tasks.  Sit<>stand with RW and +2 A for lifting following multiple attempts of Max A of 1, but pt continues to be limited for strong posterior push. Once up, pt ambulated 40' with RW and mod A for trunk control and forward progression. Short, shuffling gait. Sit>supine with Mod A, increased time and cues for technique. Rolling R/L for changing with Mod/max A and bridging to [facility] with +2. Pt left comfortably with family and all needs in reach.   Therapy Documentation Precautions:  Precautions Precautions: Fall, ICD/Pacemaker Restrictions Weight Bearing Restrictions: No    Pain: none     See Function Navigator for Current Functional Status.   Therapy/Group: Individual Therapy  Kennieth Rad, PT, DPT  Dwaine Deter, [place] 10/25/2016, 9:00 AM

## 2016-10-26 ENCOUNTER — Inpatient Hospital Stay (HOSPITAL_COMMUNITY): Payer: Medicare Other

## 2016-10-26 ENCOUNTER — Inpatient Hospital Stay (HOSPITAL_COMMUNITY): Payer: Medicare Other | Admitting: Physical Therapy

## 2016-10-26 ENCOUNTER — Inpatient Hospital Stay (HOSPITAL_COMMUNITY): Payer: Medicare Other | Admitting: Speech Pathology

## 2016-10-26 DIAGNOSIS — I10 Essential (primary) hypertension: Secondary | ICD-10-CM

## 2016-10-26 LAB — BASIC METABOLIC PANEL
Anion gap: 13 (ref 5–15)
BUN: 26 mg/dL — AB (ref 6–20)
CALCIUM: 9.9 mg/dL (ref 8.9–10.3)
CO2: 19 mmol/L — ABNORMAL LOW (ref 22–32)
CREATININE: 1.25 mg/dL — AB (ref 0.61–1.24)
Chloride: 111 mmol/L (ref 101–111)
GFR calc Af Amer: 60 mL/min — ABNORMAL LOW (ref >60)
GFR, EST NON AFRICAN AMERICAN: 51 mL/min — AB (ref >60)
GLUCOSE: 105 mg/dL — AB (ref 65–99)
Potassium: 5.4 mmol/L — ABNORMAL HIGH (ref 3.5–5.1)
SODIUM: 143 mmol/L (ref 135–145)

## 2016-10-26 LAB — CBC
HCT: 45.2 % (ref 39.0–52.0)
Hemoglobin: 15.3 g/dL (ref 13.0–17.0)
MCH: 32.4 pg (ref 26.0–34.0)
MCHC: 33.8 g/dL (ref 30.0–36.0)
MCV: 95.8 fL (ref 78.0–100.0)
PLATELETS: 196 10*3/uL (ref 150–400)
RBC: 4.72 MIL/uL (ref 4.22–5.81)
RDW: 15.4 % (ref 11.5–15.5)
WBC: 7.5 10*3/uL (ref 4.0–10.5)

## 2016-10-26 LAB — URINALYSIS, COMPLETE (UACMP) WITH MICROSCOPIC
Bilirubin Urine: NEGATIVE
GLUCOSE, UA: NEGATIVE mg/dL
Hgb urine dipstick: NEGATIVE
Ketones, ur: NEGATIVE mg/dL
LEUKOCYTES UA: NEGATIVE
NITRITE: NEGATIVE
PH: 5 (ref 5.0–8.0)
PROTEIN: 100 mg/dL — AB
SPECIFIC GRAVITY, URINE: 1.018 (ref 1.005–1.030)
Squamous Epithelial / LPF: NONE SEEN

## 2016-10-26 LAB — GLUCOSE, CAPILLARY
GLUCOSE-CAPILLARY: 128 mg/dL — AB (ref 65–99)
Glucose-Capillary: 84 mg/dL (ref 65–99)
Glucose-Capillary: 126 mg/dL — ABNORMAL HIGH (ref 65–99)
Glucose-Capillary: 84 mg/dL (ref 65–99)

## 2016-10-26 MED ORDER — SODIUM POLYSTYRENE SULFONATE 15 GM/60ML PO SUSP
15.0000 g | Freq: Once | ORAL | Status: DC
  Filled 2016-10-26: qty 60

## 2016-10-26 MED ORDER — ORAL CARE MOUTH RINSE
15.0000 mL | Freq: Two times a day (BID) | OROMUCOSAL | Status: DC
  Administered 2016-10-26 – 2016-11-04 (×15): 15 mL via OROMUCOSAL

## 2016-10-26 MED ORDER — SODIUM POLYSTYRENE SULFONATE 15 GM/60ML PO SUSP
15.0000 g | Freq: Once | ORAL | Status: AC
  Administered 2016-10-26: 15 g via RECTAL
  Filled 2016-10-26 (×2): qty 60

## 2016-10-26 NOTE — Progress Notes (Signed)
Occupational Therapy Weekly Progress Note  Patient Details  Name: [name] MRN: [id number] Date of Birth: November 22, 1930  Beginning of progress report period: October 19, 2016 End of progress report period: October 26, 2016  Patient has met 0 of 4 short term goals.  Pt had demonstrated inconsistent gains in BADLs since admission.  Pt continues to required fluctuating assistance ranging from min A to max A/tot A with all BADLs including functional transfers.  Pt requires tot A for orientation and max multimodal cues for task initiation, sequencing, and attention to task.   Patient continues to demonstrate the following deficits:altered mental status, cognitive deficits and muscle weakness (generalized) and therefore will continue to benefit from skilled OT intervention to enhance overall performance with BADL and Reduce care partner burden.  Patient progressing toward long term goals..  Continue plan of care.  OT Short Term Goals Week 1:  OT Short Term Goal 1 (Week 1): Pt will complete stand pivot transfer to toilet with [place].  OT Short Term Goal 1 - Progress (Week 1): Progressing toward goal OT Short Term Goal 2 (Week 1): Pt will demonstrate sustained attention for completing at least 2/3 seated grooming ADL tasks. OT Short Term Goal 2 - Progress (Week 1): Progressing toward goal OT Short Term Goal 3 (Week 1): Pt will complete LB dressing with ModA.  OT Short Term Goal 3 - Progress (Week 1): Progressing toward goal OT Short Term Goal 4 (Week 1): Pt will demonstrate dynamic sitting balance with MinA during functional task completion.  OT Short Term Goal 4 - Progress (Week 1): Progressing toward goal Week 2:  OT Short Term Goal 1 (Week 2): Pt will complete stand pivot transfer to toilet with ModA OT Short Term Goal 2 (Week 2): Pt will demonstrate sustained attention for completing at least 2/3 seated grooming ADL tasks. OT Short Term Goal 3 (Week 2): Pt will complete LB dressing with ModA.  OT  Short Term Goal 4 (Week 2): Pt will demonstrate dynamic sitting balance with MinA during functional task completion.       Therapy Documentation Precautions:  Precautions Precautions: Fall, ICD/Pacemaker Restrictions Weight Bearing Restrictions: No     See Function Navigator for Current Functional Status.    [name] [facility] 10/26/2016, 6:51 AM

## 2016-10-26 NOTE — Plan of Care (Signed)
Problem: RH COGNITION-NURSING Goal: RH STG ANTICIPATES NEEDS/CALLS FOR ASSIST W/ASSIST/CUES STG Anticipates Needs/Calls for Assist With mod Assistance/Cues.  Outcome: Not Progressing Hx dementia- does not call/anticipate needs/no insight

## 2016-10-26 NOTE — Progress Notes (Signed)
Physical Therapy Weekly Progress Note  Patient Details  Name: [name] MRN: [id number] Date of Birth: Sep 21, 1930  Beginning of progress report period: October 19, 2016 End of progress report period: October 26, 2016  Today's Date: 10/26/2016 PT Individual Time: 3474-2595 PT Individual Time Calculation (min): 27 min   Patient has met 3 of 3 short term goals.  Patient most consistently requires modA for bed mobility, modA for transfers, min/modA gait with RW and stairs, however assist needed fluctuates with attention, arousal, orientation and can occasionally require up to max/totalA +2 when pt is overly lethargic, disoriented. Pt significantly limited by decreased attention, arousal, memory and fatigue.   Patient continues to demonstrate the following deficits muscle weakness, decreased cardiorespiratoy endurance, impaired timing and sequencing, abnormal tone, unbalanced muscle activation, decreased coordination and decreased motor planning, decreased initiation, decreased attention, decreased awareness, decreased problem solving, decreased safety awareness, decreased memory and delayed processing and decreased sitting balance, decreased standing balance, decreased postural control and decreased balance strategies and therefore will continue to benefit from skilled PT intervention to increase functional independence with mobility.  Patient progressing toward long term goals..  Continue plan of care.  PT Short Term Goals Week 1:  PT Short Term Goal 1 (Week 1): Pt will be able to transfer with mod assist PT Short Term Goal 1 - Progress (Week 1): Met PT Short Term Goal 2 (Week 1): Pt will be able to gait x 25' with max +2 PT Short Term Goal 2 - Progress (Week 1): Met PT Short Term Goal 3 (Week 1): Pt will be able to initiate stair negotiation PT Short Term Goal 3 - Progress (Week 1): Met Week 2:  PT Short Term Goal 1 (Week 2): Pt will transfer with consistent minA PT Short Term Goal 2 (Week 2):  Pt will perform bed mobility with minA PT Short Term Goal 3 (Week 2): Pt will perform gait x50' with consistent minA PT Short Term Goal 4 (Week 2): Pt will attend to functional mobility task >2 min with minimal cueing   Skilled Therapeutic Interventions/Progress Updates: Pt received seated in w/c at nurses station, denies pain and agreeable to treatment. Pt very lethargic, oriented to self only, and falling asleep easily with reduction of auditory stimulus. Sit >stand maxA with repetitive cueing for technique and initiation. Gait initiated to attempt to arouse pt, increase engagement and alertness. Gait x5' with maxA d/t posterior lean; pt appears to have fallen asleep during gait trial and BLEs buckled, pt startled awake and required max/totalA to prevent fall. Returned to room totalA. Stand pivot transfer w/c>bed with maxA using RW. +2 sit >supine. Remained supine at end of session, all needs in reach.      Therapy Documentation Precautions:  Precautions Precautions: Fall, ICD/Pacemaker Restrictions Weight Bearing Restrictions: No General: PT Amount of Missed Time (min): 33 Minutes PT Missed Treatment Reason: Patient fatigue   See Function Navigator for Current Functional Status.  Therapy/Group: Individual Therapy  Luberta Mutter 10/26/2016, 1:41 PM

## 2016-10-26 NOTE — Progress Notes (Signed)
[place] PHYSICAL MEDICINE & REHABILITATION     PROGRESS NOTE    Subjective/Complaints: Lying in bed. States "my thinking isn't right"  ROS: Limited due to cognitive/behavioral    Objective: Vital Signs: Blood pressure 116/64, pulse 97, temperature 97.8 F (36.6 C), temperature source Oral, resp. rate 18, height 5\' 10"  (1.778 m), weight 76.2 kg (168 lb), SpO2 97 %. No results found. No results for input(s): WBC, HGB, HCT, PLT in the last 72 hours. No results for input(s): NA, K, CL, GLUCOSE, BUN, CREATININE, CALCIUM in the last 72 hours.  Invalid input(s): CO CBG (last 3)   Recent Labs  10/25/16 1132 10/25/16 2124 10/26/16 0635  GLUCAP 119* 175* 128*    Wt Readings from Last 3 Encounters:  10/26/16 76.2 kg (168 lb)  10/16/16 75 kg (165 lb 5.5 oz)  09/03/16 79.4 kg (175 lb)    Physical Exam:  Constitutional: He appears well-developed and well-nourished.  HENT:  Head: Normocephalic and atraumatic.  Eyes: EOM are normal. Right eye exhibits no discharge. Left eye exhibits no discharge.  Neck: Normal range of motion. Neck supple. No thyromegaly present.  Cardiovascular:RRR without murmur. No JVD   Respiratory: CTA Bilaterally without wheezes or rales. Normal effort  GI: Soft. Bowel sounds are normal. He exhibits no distension.  Musculoskeletal: He exhibits no edema or tenderness.  Neurological:  Falls asleep easily. Able to carry conversation. Oriented to self only.  Motor: 4-4+/5 through all 4's.  DTRs 3+ RUE/RLE.   Skin: Skin is warm and dry.  Psychiatric:  Pleasantly confused    Assessment/Plan: 1. Functional and cognitive deficits secondary to encephalopathy and recent right PICA infarct which require 3+ hours per day of interdisciplinary therapy in a comprehensive inpatient rehab setting. Physiatrist is providing close team supervision and 24 hour management of active medical problems listed below. Physiatrist and rehab team continue to assess barriers to  discharge/monitor patient progress toward functional and medical goals.  Function:  Bathing Bathing position   Position: Wheelchair/chair at sink  Bathing parts Body parts bathed by patient: Right arm, Left arm, Chest, Abdomen Body parts bathed by helper: Front perineal area, Buttocks, Right upper leg, Left upper leg, Right lower leg, Left lower leg  Bathing assist Assist Level: Touching or steadying assistance(Pt > 75%)      Upper Body Dressing/Undressing Upper body dressing   What is the patient wearing?: Pull over shirt/dress     Pull over shirt/dress - Perfomed by patient: Thread/unthread right sleeve, Put head through opening, Pull shirt over trunk Pull over shirt/dress - Perfomed by helper: Thread/unthread left sleeve        Upper body assist Assist Level: Touching or steadying assistance(Pt > 75%)      Lower Body Dressing/Undressing Lower body dressing   What is the patient wearing?: Pants, Non-skid slipper socks   Underwear - Performed by helper: Thread/unthread right underwear leg, Thread/unthread left underwear leg, Pull underwear up/down Pants- Performed by patient: Pull pants up/down Pants- Performed by helper: Thread/unthread right pants leg, Thread/unthread left pants leg   Non-skid slipper socks- Performed by helper: Don/doff right sock, Don/doff left sock                  Lower body assist Assist for lower body dressing:  (Total Assist)      Toileting Toileting Toileting activity did not occur: No continent bowel/bladder event   Toileting steps completed by helper: Adjust clothing prior to toileting, Performs perineal hygiene, Adjust clothing after toileting Toileting Assistive  Devices: Grab bar or Photographer level: Two helpers   Transfers Chair/bed transfer   Chair/bed transfer method: Squat pivot Chair/bed transfer assist level: Maximal assist (Pt 25 - 49%/lift and lower) Chair/bed transfer assistive device: Armrests,  Bedrails Mechanical lift: Stedy   Locomotion Ambulation Ambulation activity did not occur: Safety/medical concerns   Max distance: 50 Assist level: Moderate assist (Pt 50 - 74%)   Wheelchair   Type: Manual Max wheelchair distance: 75 Assist Level: Touching or steadying assistance (Pt > 75%)  Cognition Comprehension Comprehension assist level: Understands basic less than 25% of the time/ requires cueing >75% of the time  Expression Expression assist level: Expresses basis less than 25% of the time/requires cueing >75% of the time.  Social Interaction Social Interaction assist level: Interacts appropriately less than 25% of the time. May be withdrawn or combative.  Problem Solving Problem solving assist level: Solves basic less than 25% of the time - needs direction nearly all the time or does not effectively solve problems and may need a restraint for safety  Memory Memory assist level: Recognizes or recalls less than 25% of the time/requires cueing greater than 75% of the time   Medical Problem List and Plan: 1.  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   -team conference today  -poor stamina, remains confused   2.  DVT Prophylaxis/Anticoagulation: Subcutaneous Lovenox. Monitor platelet counts of any signs of bleeding 3. Pain Management: Tylenol as needed 4. Mood: Provide emotional support 5. Neuropsych: This patient Is not capable of making decisions on his own behalf. 6. Skin/Wound Care: Routine skin checks 7. Fluids/Electrolytes/Nutrition:   -continue to encourage po  -check labs today   8. Diabetes mellitus with peripheral neuropathy. Hemoglobin A1c 7.5.    -reasonable control  -intake looked better over weekend but dipped yesterday  -decreased lantus insulin  to 10 units  qam with  improvement.    -continue metformin at current  dose    9. CKD stage III.  Labs stable 10. CAD with pacemaker. Continue aspirin. No chest pain or shortness of breath 11. Chronic systolic congestive heart failure. Monitor for any signs of fluid overload   -daily weights===decr to 76k today 12. Hypertension. No current antihypertensive medications. Close monitoring with with mobility 13. Tick exposure.RMSF negative. Completed course of doxycycline   LOS (Days) 8 A FACE TO FACE EVALUATION WAS PERFORMED  Meredith Staggers, MD 10/26/2016 8:18 AM

## 2016-10-26 NOTE — Progress Notes (Signed)
Attempted to give patient Kayexalate orally with drink and solids. Patient held in mouth for 1 minute did not swallow and some spilled out of mouth. Medication had to be suctioned out. Spoke with Silvestre Mesi PA. OK to give rectal. Rectal kayexalate given at 1830. Retention enema kit not in stock at this time.

## 2016-10-26 NOTE — Progress Notes (Signed)
Speech Language Pathology Daily Session Note  Patient Details  Name: [name] MRN: [id number] Date of Birth: 1930-07-22  Today's Date: 10/26/2016 SLP Individual Time: 1500-1530 SLP Individual Time Calculation (min): 30 min  Short Term Goals: Week 1: SLP Short Term Goal 1 (Week 1): Pt will consume dys 1 textures and thin liquids with min assist verbal cues for use of swallowing precautions and minimal overt s/s of aspiration  SLP Short Term Goal 1 - Progress (Week 1): Discontinued (comment) (Pt with overt s/s of aspiration with thin liquids) SLP Short Term Goal 2 (Week 1): Pt will maintain alertness for 2 minute intervals with max assist multimodal cues.   SLP Short Term Goal 3 (Week 1): Pt will sustain his attention to task for 1 minute intervals with max assist multimodal cues for redirection.   SLP Short Term Goal 4 (Week 1): Pt will utilize external aids to orient to place, date, and situation with max assist multimodal cues.   SLP Short Term Goal 5 (Week 1): Pt will consume dys 1 textures and nectar liquids with Max assist verbal cues for use of swallowing precautions and minimal overt s/s of aspiration   Skilled Therapeutic Interventions: Pt was seen for skilled ST targeting cognitive goals.  Pt was awake and pleasant upon therapist's arrival so therapist attempted trials of thin liquids to continue working towards diet progression; however, pt spilled ice chips on himself when attempting to self feed and needed total assist to don a clean, dry shirt due to going into full, locked extension of BUEs with therapist's attempts to help.  When therapist encouraged pt to don shirt himself, he needed max to total cues for task initiation.  Furthermore, after clean shirt was on, pt was too lethargic to safely participate in POs.  Therapist was unable to arouse despite auditory and tactile stim.  Therefore, session was ended early.  Pt was left in bed with bed alarm set and call bell within reach.   Continue per current plan of care.       Function:  Eating Eating   Modified Consistency Diet: Yes Eating Assist Level: Supervision or verbal cues           Cognition Comprehension Comprehension assist level: Understands basic less than 25% of the time/ requires cueing >75% of the time  Expression   Expression assist level: Expresses basis less than 25% of the time/requires cueing >75% of the time.  Social Interaction Social Interaction assist level: Interacts appropriately less than 25% of the time. May be withdrawn or combative.  Problem Solving Problem solving assist level: Solves basic less than 25% of the time - needs direction nearly all the time or does not effectively solve problems and may need a restraint for safety  Memory Memory assist level: Recognizes or recalls less than 25% of the time/requires cueing greater than 75% of the time    Pain Pain Assessment Pain Assessment: No/denies pain  Therapy/Group: Individual Therapy  Murial Beam, Selinda Orion 10/26/2016, 4:08 PM

## 2016-10-26 NOTE — Progress Notes (Signed)
Occupational Therapy Session Note  Patient Details  Name: [name] MRN: [id number] Date of Birth: [birth year]  Today's Date: 10/26/2016 OT Individual Time: 1100-1200 OT Individual Time Calculation (min): 60 min    Short Term Goals: Week 2:  OT Short Term Goal 1 (Week 2): Pt will complete stand pivot transfer to toilet with ModA OT Short Term Goal 2 (Week 2): Pt will demonstrate sustained attention for completing at least 2/3 seated grooming ADL tasks. OT Short Term Goal 3 (Week 2): Pt will complete LB dressing with ModA.  OT Short Term Goal 4 (Week 2): Pt will demonstrate dynamic sitting balance with MinA during functional task completion.   Skilled Therapeutic Interventions/Progress Updates:    Pt resting in bed upon arrival.  Pt oriented to person only and required tot A for orientation to place, situation, or date.  Pt required mod A for supine>sit EOB in preparation for transfer to w/c.  Attempted squat pivot transfer, stand pivot transfer, and transfer with Stedy to w/c.  Pt continued to physically resist and bathing initiated while seated EOB.  Pt maintained sitting balance for UB bathing tasks.  Pt performed sit<>stand in [place] to complete LB bathing tasks and pt able to maintain standing balance adequately to perform transfer to w/c to complete dressing tasks.  Pt unable to maintain focused attention adequately enough to actively engaged in dressing tasks and required tot A.  Pt physically resisted brushing his teeth.  Focus on task initiation, sequencing, and attention to task to actively engaged in BADL retraining.  Pt remained in w/c with QRB in place and resting at nursing station.    Therapy Documentation Precautions:  Precautions Precautions: Fall, ICD/Pacemaker Restrictions Weight Bearing Restrictions: No Pain:  Pt denies pain  See Function Navigator for Current Functional Status.   Therapy/Group: Individual Therapy  Leroy Libman 10/26/2016, 2:51 PM

## 2016-10-27 ENCOUNTER — Inpatient Hospital Stay (HOSPITAL_COMMUNITY): Payer: Medicare Other | Admitting: Occupational Therapy

## 2016-10-27 ENCOUNTER — Inpatient Hospital Stay (HOSPITAL_COMMUNITY): Payer: Medicare Other

## 2016-10-27 ENCOUNTER — Inpatient Hospital Stay (HOSPITAL_COMMUNITY): Payer: Medicare Other | Admitting: Speech Pathology

## 2016-10-27 ENCOUNTER — Inpatient Hospital Stay (HOSPITAL_COMMUNITY): Payer: Medicare Other | Admitting: Physical Therapy

## 2016-10-27 DIAGNOSIS — I5042 Chronic combined systolic (congestive) and diastolic (congestive) heart failure: Secondary | ICD-10-CM

## 2016-10-27 LAB — GLUCOSE, CAPILLARY
GLUCOSE-CAPILLARY: 132 mg/dL — AB (ref 65–99)
GLUCOSE-CAPILLARY: 96 mg/dL (ref 65–99)
GLUCOSE-CAPILLARY: 119 mg/dL — AB (ref 65–99)
Glucose-Capillary: 88 mg/dL (ref 65–99)

## 2016-10-27 LAB — BASIC METABOLIC PANEL
Anion gap: 11 (ref 5–15)
BUN: 23 mg/dL — AB (ref 6–20)
CALCIUM: 9.6 mg/dL (ref 8.9–10.3)
CO2: 23 mmol/L (ref 22–32)
CREATININE: 1.29 mg/dL — AB (ref 0.61–1.24)
Chloride: 113 mmol/L — ABNORMAL HIGH (ref 101–111)
GFR calc Af Amer: 57 mL/min — ABNORMAL LOW (ref >60)
GFR, EST NON AFRICAN AMERICAN: 50 mL/min — AB (ref >60)
GLUCOSE: 102 mg/dL — AB (ref 65–99)
POTASSIUM: 4.9 mmol/L (ref 3.5–5.1)
SODIUM: 147 mmol/L — AB (ref 135–145)

## 2016-10-27 LAB — URINE CULTURE: CULTURE: NO GROWTH

## 2016-10-27 LAB — PREALBUMIN: PREALBUMIN: 8 mg/dL — AB (ref 18–38)

## 2016-10-27 NOTE — Progress Notes (Signed)
Initial Nutrition Assessment  DOCUMENTATION CODES:   Not applicable  INTERVENTION:   -Snacks TID (Magic Cup and [employer]) -Magic Cup TID with meals  NUTRITION DIAGNOSIS:   Inadequate oral intake related to lethargy/confusion as evidenced by meal completion < 25%.  GOAL:   Patient will meet greater than or equal to 90% of their needs  MONITOR:   PO intake, Supplement acceptance, Diet advancement, Labs, Weight trends, Skin, I & O's  REASON FOR ASSESSMENT:   Consult Assessment of nutrition requirement/status  ASSESSMENT:   Pt admitted to CIR for dx of decreased functional mobility secondary to acute recent infarct distribution of a portion of the right posterior inferior cerebellar artery with underlying dementia acute encephalopathy.   Case discussed with RN prior to visit, who reports pt has been making minimal progress over the past 9 days. Pt with very poor oral intake, however, pt ate about 40% of breakfast this morning, due to very vigilant and encouraging nurse tech providing feeding assistance. RN reports family members are coming in today to feed pt. Pt is easily distractible with short attention span.   Pt unable to provide any further hx. Noted 50% of pudding cup eaten on tray table.   No wt hx available to assess trends.   Nutrition-Focused physical exam completed. Findings are mild (orbital region only) fat depletion, mild (temple, clavicle, and calf) muscle depletion, and no edema. Suspect fat and muscle depletion may be related to advanced age.   Unable to identify malnutrition at this time, however, at high risk due to prolonged poor oral intake and dementia.  Labs reviewed: Na: 147, CBGS: 84-126.   Diet Order:  DIET - DYS 1 Room service appropriate? Yes; Fluid consistency: Nectar Thick  Skin:  Reviewed, no issues  Last BM:  10/27/16  Height:   Ht Readings from Last 1 Encounters:  10/18/16 5\' 10"  (1.778 m)    Weight:   Wt Readings from  Last 1 Encounters:  10/27/16 168 lb (76.2 kg)    Ideal Body Weight:  75.5 kg  BMI:  Body mass index is 24.11 kg/m.  Estimated Nutritional Needs:   Kcal:  1650-1850  Protein:  80-95 grams  Fluid:  1.6-1.8 L  EDUCATION NEEDS:   Education needs addressed  Jamani Eley A. Jimmye Norman, RD, LDN, CDE Pager: [phone] After hours Pager: [phone]

## 2016-10-27 NOTE — Progress Notes (Signed)
Social Work Patient ID: [name], male   DOB: 1930-08-16, 83 y.o.   MRN: [id number]   Have reviewed team conference with pt's daughter, [name].  Daughter aware and agrees that pt's dementia appears to be worsening and interfering in any functional progress.  Stressed to her that team feels he will need significant assistance at home and the SNF may be better option if she feels she cannot meet care needs.  Daughter quickly states, "I think I can do it."  Have planned for daughter to be here on Friday 11-2 to begin family ed and hands-on training.  Plan at this time remains for pt to d/c home with his daughter providing 24/7 assistance.  Will follow up with her on Friday as well.  Cele Mote, LCSW

## 2016-10-27 NOTE — Progress Notes (Signed)
Occupational Therapy Session Note  Patient Details  Name: [name] MRN: [id number] Date of Birth: November 23, 1930  Today's Date: 10/27/2016 OT Individual Time: 9935-7017 OT Individual Time Calculation (min): 15 min    Short Term Goals: Week 2:  OT Short Term Goal 1 (Week 2): Pt will complete stand pivot transfer to toilet with ModA OT Short Term Goal 2 (Week 2): Pt will demonstrate sustained attention for completing at least 2/3 seated grooming ADL tasks. OT Short Term Goal 3 (Week 2): Pt will complete LB dressing with ModA.  OT Short Term Goal 4 (Week 2): Pt will demonstrate dynamic sitting balance with MinA during functional task completion.   Skilled Therapeutic Interventions/Progress Updates:    Pt presented sitting up in w/c, RN present in room assisting Pt with self-feeding. Pt dozing off, with therapist providing Max multimodal cues to keep Pt aroused to swallow food. Pt completing simple grooming activities from w/c level at sink requiring HOH assist throughout and Max multimodal cues to keep Pt awake as Pt dozing off throughout, unable to keep eyes open more than approx 1 min at a time. Attempted assisting Pt with AAROM bil UEs with Pt having difficulty attending to activity as he continued to doze off and with increased difficulty arousing as session progressed despite Max multimodal cues and attempts.  Pt missed 30 min skilled OT secondary to fatigue/lethargy.  Therapy Documentation Precautions:  Precautions Precautions: Fall, ICD/Pacemaker Restrictions Weight Bearing Restrictions: No General: General OT Amount of Missed Time: 30 Minutes   Pain: Pain Assessment Pain Assessment: Faces Faces Pain Scale: No hurt ADL: ADL ADL Comments: see functional navigator   See Function Navigator for Current Functional Status.   Therapy/Group: Individual Therapy  Raymondo Band 10/27/2016, 4:30 PM

## 2016-10-27 NOTE — Progress Notes (Signed)
Speech Language Pathology Weekly Progress and Session Note  Patient Details  Name: [name] MRN: [id number] Date of Birth: 05/12/1930  Beginning of progress report period: October 19, 2016  End of progress report period: October 27, 2016   Today's Date: 10/27/2016 SLP Individual Time: 0905-0925     Short Term Goals: Week 1: SLP Short Term Goal 1 (Week 1): Pt will consume dys 1 textures and thin liquids with min assist verbal cues for use of swallowing precautions and minimal overt s/s of aspiration  SLP Short Term Goal 1 - Progress (Week 1): Discontinued (comment) (Pt with overt s/s of aspiration with thin liquids) SLP Short Term Goal 2 (Week 1): Pt will maintain alertness for 2 minute intervals with max assist multimodal cues.   SLP Short Term Goal 2 - Progress (Week 1): Revised due to lack of progress SLP Short Term Goal 3 (Week 1): Pt will sustain his attention to task for 1 minute intervals with max assist multimodal cues for redirection.   SLP Short Term Goal 3 - Progress (Week 1): Revised due to lack of progress SLP Short Term Goal 4 (Week 1): Pt will utilize external aids to orient to place, date, and situation with max assist multimodal cues.   SLP Short Term Goal 4 - Progress (Week 1): Not met SLP Short Term Goal 5 (Week 1): Pt will consume dys 1 textures and nectar liquids with Max assist verbal cues for use of swallowing precautions and minimal overt s/s of aspiration  SLP Short Term Goal 5 - Progress (Week 1): Not met    New Short Term Goals: Week 2: SLP Short Term Goal 1 (Week 2): Pt will consume therapeutic trials of thin liquids with max assist multimodal cues for use of swallowing precautions.   SLP Short Term Goal 2 (Week 2): Pt will maintain alertness for 1 minute intervals with max assist multimodal cues.   SLP Short Term Goal 3 (Week 2): Pt will sustain his attention to task for 30 second intervals with max assist multimodal cues for redirection.   SLP Short Term Goal 4  (Week 2): Pt will utilize external aids to orient to place, date, and situation with max assist multimodal cues.   SLP Short Term Goal 5 (Week 2): Pt will consume dys 1 textures and nectar liquids with Max assist verbal cues for use of swallowing precautions and minimal overt s/s of aspiration   Weekly Progress Updates:  Pt has made no functional gains this reporting period and has met no short term goals.  Pt's diet has been downgraded to dys 1 textures and nectar thick liquids due to a severe dysphagia that appears to be primarily cognitive in nature.  Pt is not oriented and is only able to briefly focus his attention to stimuli.  The majority of therapy sessions are spent trying to keep pt awake or getting him to accept care.  Strongly recommend 24/7 supervision at discharge for all basic daily tasks due to the abovementioned deficits.  No family has been present for education at this time.       Intensity: Minumum of 1-2 x/day, 30 to 90 minutes Frequency: 3 to 5 out of 7 days Duration/Length of Stay: 14-21 days  Treatment/Interventions: Cognitive remediation/compensation;Cueing hierarchy;Dysphagia/aspiration precaution training;Functional tasks;Internal/external aids;Patient/family education   Daily Session  Skilled Therapeutic Interventions: Pt was seen for skilled ST targeting cognitive goals.  Pt asleep upon therapist's arrival but briefly arousable to voice and light touch.  Pt needed total   assist multimodal cues for initiation of simple, 1 step tasks such as bringing cool wash cloth to face.  Pt only intermittently responded to questions with max assist multimodal cues for focused attention to therapist.    No POs were administered on this date due to lethargy; however, RN reports that nurse tech was able to get pt to eat breakfast with max-total cues for attention to bolus and swallow initiation.   Pt increasingly somnolent as session progressed; as a result, session was ended early    Function:   Eating Eating                 Cognition Comprehension Comprehension assist level: Understands basic less than 25% of the time/ requires cueing >75% of the time  Expression   Expression assist level: Expresses basis less than 25% of the time/requires cueing >75% of the time.  Social Interaction Social Interaction assist level: Interacts appropriately less than 25% of the time. May be withdrawn or combative.  Problem Solving Problem solving assist level: Solves basic less than 25% of the time - needs direction nearly all the time or does not effectively solve problems and may need a restraint for safety  Memory Memory assist level: Recognizes or recalls less than 25% of the time/requires cueing greater than 75% of the time   General    Pain Pain Assessment Pain Assessment: No/denies pain Pain Score: 0-No pain  Therapy/Group: Individual Therapy  ,  L 10/27/2016, 9:54 AM         

## 2016-10-27 NOTE — Patient Care Conference (Signed)
Inpatient RehabilitationTeam Conference and Plan of Care Update Date: 10/26/2016   Time: 2:20 PM    Patient Name: [name]      Medical Record Number: [id number]  Date of Birth: [birth year] Sex: Male         Room/Bed: 4W10C/4W10C-01 Payor Info: Payor: MEDICARE / Plan: MEDICARE PART A AND B / Product Type: *No Product type* /    Admitting Diagnosis: CVA  Admit Date/Time:  10/18/2016  4:44 PM Admission Comments: No comment available   Primary Diagnosis:  Acute encephalopathy Principal Problem: Acute encephalopathy  Patient Active Problem List   Diagnosis Date Noted  . Acute encephalopathy 10/18/2016  . Tick bite of calf, initial encounter   . Stage 3 chronic kidney disease   . Diabetes mellitus type 2 in nonobese (HCC)   . Coronary artery disease involving native coronary artery of native heart without angina pectoris   . Dementia without behavioral disturbance   . Chronic systolic heart failure ([place])   . Debilitated 11/18/2015  . Neuropathic pain   . Tachycardia   . Pacemaker   . DM type 2 with diabetic peripheral neuropathy ([place])   . Benign essential HTN   . History of syncope   . LBBB (left bundle branch block)   . Acute blood loss anemia   . NSVT (nonsustained ventricular tachycardia) ([place])   . Bradycardia, severe sinus 11/13/2015  . Complete heart block ([place])   . Chronic combined systolic and diastolic heart failure ([place]) 04/27/2013  . Essential hypertension 04/27/2013  . Left bundle branch block 04/27/2013  . Type II or unspecified type diabetes mellitus with neurological manifestations, uncontrolled 01/15/2013    Expected Discharge Date: Expected Discharge Date: 11-28-16  Team Members Present: Physician leading conference: Dr. Alger Simons Social Worker Present: Lennart Pall, LCSW Nurse Present: Other (comment) Blair Heys, RN) PT Present: Kem Parkinson, PT OT Present: Roanna Epley, [place], OT SLP Present: Weston Anna, SLP PPS  Coordinator present : Daiva Nakayama, RN, CRRN     Current Status/Progress Goal Weekly Team Focus  Medical   fair intake, cbg's improving.   increase functional mobility`  diabetes, nutrition   Bowel/Bladder   Incontinent of B/B. LBM 10/24/16.  Patient to be continent of bowel and bladder while on IPR.  Assess bowel and bladder needs.  Consider a toileting schedule.   Swallow/Nutrition/ Hydration   Total A with dysphagia 1 and nectar thick liquids  Max A - downgraded 6/18  Safety with PO intake   ADL's   max A for functional transfers, max A sit<>stand, max/tot A for BADLs secondary limited awareness and impaired cognition  min A overall  cognitive remeidation, sit<>stand, functional transfers, standing balance, family educaiton   Mobility   modA bed mobility, modA transfers, min/modA gait with RW however fluctuates with attention/arousal  min/supervision overall  activity tolerance, bed mobility, transfer and gait training, dynamic standing balance, neuro re-ed, cognitive remediation   Communication             Safety/Cognition/ Behavioral Observations  Max to Total Assist, limited by extremely brief peirods of sustained attention  Max A - downgraded 6/18  arousal, attention to basic, familiar tasks   Pain   Patient denies pain at this time.  No c/o pain.  Patient to maintain no c/o pain.  Patient to have pain level <3 while on IPR.  Continue to assess pain q shift and PRN.   Skin   Patient skin integrity is clean/dry/intact.  Patient to maintain  intact skin integrity while on IPR.  Continue to assess skin q shift and PRN.      *See Care Plan and progress notes for long and short-term goals.  Barriers to Discharge: safety, balance, cognition    Possible Resolutions to Barriers:  continued care giver educatin, adaptive equipment    Discharge Planning/Teaching Needs:  Daughter plans for pt to return home with her and she will provide 24/7 assistance.  Will need to be scheduled with  daughter.   Team Discussion:  Dementia becoming more prevalent and doubtful will see much cognitive improvement per MD.  Sit - stands can range from supervision/ min assist to max/ total dependent on his cognitive state at the time.  Still total assist with feeding on D1, nectar diet.  Anticipate will continue to range in assist need at home.  SW to follow up with daughter about her ability to manage him at his max/ total assist days.  Revisions to Treatment Plan:  Some goals downgraded.   Continued Need for Acute Rehabilitation Level of Care: The patient requires daily medical management by a physician with specialized training in physical medicine and rehabilitation for the following conditions: Daily direction of a multidisciplinary physical rehabilitation program to ensure safe treatment while eliciting the highest outcome that is of practical value to the patient.: Yes Daily medical management of patient stability for increased activity during participation in an intensive rehabilitation regime.: Yes Daily analysis of laboratory values and/or radiology reports with any subsequent need for medication adjustment of medical intervention for : Neurological problems;Nutritional problems;Diabetes problems  Davide Risdon 10/27/2016, 1:11 PM

## 2016-10-27 NOTE — Progress Notes (Signed)
Occupational Therapy Session Note  Patient Details  Name: [name] MRN: [id number] Date of Birth: [birth year]  Today's Date: 10/27/2016 OT Individual Time: 1100-1145 OT Individual Time Calculation (min): 45 min  and Today's Date: 10/27/2016 OT Missed Time: 15 Minutes Missed Time Reason: Patient fatigue   Short Term Goals: Week 2:  OT Short Term Goal 1 (Week 2): Pt will complete stand pivot transfer to toilet with ModA OT Short Term Goal 2 (Week 2): Pt will demonstrate sustained attention for completing at least 2/3 seated grooming ADL tasks. OT Short Term Goal 3 (Week 2): Pt will complete LB dressing with ModA.  OT Short Term Goal 4 (Week 2): Pt will demonstrate dynamic sitting balance with MinA during functional task completion.   Skilled Therapeutic Interventions/Progress Updates:    Pt resting in bed upon arrival.  Pt incontinent of bowel and bladder and unaware.  Pt assisted with rolling in bed (max A) but did not assist with hygiene or changing brief.  Pt required max A for supine>sit and max A for sitting balance in preparation for squat pivot transfer to w/c.  Attempted to cue patient to initiate transfer but patient persistently resisted and transfer completed with tot A.  Pt transitioned to dressing tasks with sit<>stand from w/c at sink.  Pt required tot A for all dressing tasks.  Pt was able to perform sit<>stand from w/c at sink with min multimodal cues for initiation and min A to complete task.  Attempted to engage patient in threading BUE into shirt sleeves but patient continued to close his eyes and required min multimodal cues to arouse.  Pt remained in w/c with QRB in place and resting at nursing station.   Therapy Documentation Precautions:  Precautions Precautions: Fall, ICD/Pacemaker Restrictions Weight Bearing Restrictions: No General: General OT Amount of Missed Time: 15 Minutes Vital Signs:  Pain: Pain Assessment Pain Assessment: No/denies pain Pain Score:  0-No pain  See Function Navigator for Current Functional Status.   Therapy/Group: Individual Therapy  Leroy Libman 10/27/2016, 12:06 PM

## 2016-10-27 NOTE — Progress Notes (Addendum)
[facility] PHYSICAL MEDICINE & REHABILITATION     PROGRESS NOTE    Subjective/Complaints: In low bed. No major changes over night.   ROS: Limited due to cognitive/behavioral    Objective: Vital Signs: Blood pressure (!) 159/97, pulse 84, temperature 98.6 F (37 C), temperature source Oral, resp. rate 20, height 5\' 10"  (1.778 m), weight 76.2 kg (168 lb), SpO2 96 %. No results found.  Recent Labs  10/26/16 1057  WBC 7.5  HGB 15.3  HCT 45.2  PLT 196    Recent Labs  10/26/16 1057 10/27/16 0612  NA 143 147*  K 5.4* 4.9  CL 111 113*  GLUCOSE 105* 102*  BUN 26* 23*  CREATININE 1.25* 1.29*  CALCIUM 9.9 9.6   CBG (last 3)   Recent Labs  10/26/16 1626 10/26/16 2121 10/27/16 0617  GLUCAP 126* 84 88    Wt Readings from Last 3 Encounters:  10/27/16 76.2 kg (168 lb)  10/16/16 75 kg (165 lb 5.5 oz)  09/03/16 79.4 kg (175 lb)    Physical Exam:  Constitutional: no distress HENT:  Head: Normocephalic and atraumatic.  Eyes: EOMI  Neck: Normal range of motion. Neck supple. No thyromegaly present.  Cardiovascular: RRR without murmur. No JVD    Respiratory: CTA Bilaterally without wheezes or rales. Normal effort   GI: Soft. Bowel sounds are normal. He exhibits no distension.  Musculoskeletal: He exhibits no edema or tenderness.  Neurological:  Awake and oriented to self only.  Motor: 4-4+/5 through all 4's.  DTRs 3+ RUE/RLE.   Skin: Skin is warm and dry.  Psychiatric: remains pleasantly confused   Assessment/Plan: 1. Functional and cognitive deficits secondary to encephalopathy and recent right PICA infarct which require 3+ hours per day of interdisciplinary therapy in a comprehensive inpatient rehab setting. Physiatrist is providing close team supervision and 24 hour management of active medical problems listed below. Physiatrist and rehab team continue to assess barriers to discharge/monitor patient progress toward functional and medical  goals.  Function:  Bathing Bathing position   Position: Sitting EOB  Bathing parts Body parts bathed by patient: Right arm, Left arm, Chest, Abdomen, Front perineal area Body parts bathed by helper: Buttocks, Right upper leg, Left upper leg, Right lower leg, Left lower leg  Bathing assist Assist Level: Touching or steadying assistance(Pt > 75%)      Upper Body Dressing/Undressing Upper body dressing   What is the patient wearing?: Pull over shirt/dress     Pull over shirt/dress - Perfomed by patient: Thread/unthread right sleeve, Put head through opening, Pull shirt over trunk Pull over shirt/dress - Perfomed by helper: Thread/unthread right sleeve, Thread/unthread left sleeve, Put head through opening, Pull shirt over trunk        Upper body assist Assist Level: Touching or steadying assistance(Pt > 75%)      Lower Body Dressing/Undressing Lower body dressing   What is the patient wearing?: Pants, Non-skid slipper socks   Underwear - Performed by helper: Thread/unthread right underwear leg, Thread/unthread left underwear leg, Pull underwear up/down Pants- Performed by patient: Pull pants up/down Pants- Performed by helper: Thread/unthread right pants leg, Thread/unthread left pants leg, Pull pants up/down   Non-skid slipper socks- Performed by helper: Don/doff right sock, Don/doff left sock                  Lower body assist Assist for lower body dressing: 2 Helpers      Toileting Toileting Toileting activity did not occur: No continent bowel/bladder event  Toileting steps completed by helper: Adjust clothing prior to toileting, Performs perineal hygiene, Adjust clothing after toileting Toileting Assistive Devices: Grab bar or rail  Toileting assist Assist level: Two helpers   Transfers Chair/bed transfer   Chair/bed transfer method: Stand pivot Chair/bed transfer assist level: Maximal assist (Pt 25 - 49%/lift and lower) Chair/bed transfer assistive device:  Armrests, Walker Mechanical lift: Ecologist Ambulation activity did not occur: Safety/medical concerns   Max distance: 5 Assist level: Maximal assist (Pt 25 - 49%)   Wheelchair   Type: Manual Max wheelchair distance: 75 Assist Level: Touching or steadying assistance (Pt > 75%)  Cognition Comprehension Comprehension assist level: Understands basic less than 25% of the time/ requires cueing >75% of the time  Expression Expression assist level: Expresses basis less than 25% of the time/requires cueing >75% of the time.  Social Interaction Social Interaction assist level: Interacts appropriately less than 25% of the time. May be withdrawn or combative.  Problem Solving Problem solving assist level: Solves basic less than 25% of the time - needs direction nearly all the time or does not effectively solve problems and may need a restraint for safety  Memory Memory assist level: Recognizes or recalls less than 25% of the time/requires cueing greater than 75% of the time   Medical Problem List and Plan: 1.  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   -remains confused  -likely SNF placement   2.  DVT Prophylaxis/Anticoagulation: Subcutaneous Lovenox.   3. Pain Management: Tylenol as needed 4. Mood: Provide emotional support 5. Neuropsych: This patient Is not capable of making decisions on his own behalf. 6. Skin/Wound Care: Routine skin checks 7. Fluids/Electrolytes/Nutrition:   -potassium decreased to 4.9 today, received kayexalate supp last night  -continue to encourage po.on D1/Nectar diet still   -will request dietary consult  -recheck labs in am 8. Diabetes mellitus with peripheral neuropathy. Hemoglobin A1c 7.5.    -fair control but intake very inconsistent  -decreased lantus insulin  to 10 units  qam last  week---continue at current dose    -continue metformin at current dose    9. CKD stage III.  Labs stable to improved this morning. 23/1.29  -follow up in am 10. CAD with pacemaker. Continue aspirin. No chest pain or shortness of breath 11. Chronic systolic congestive heart failure. Monitor for any signs of fluid overload   -daily weights===holding at 76kg today 12. Hypertension. No current antihypertensive medications.   -bp elevated this morning but otherwise has been under control   -observe only for now 13. Tick exposure.RMSF negative. Completed course of doxycycline   LOS (Days) 9 A FACE TO FACE EVALUATION WAS PERFORMED  Meredith Staggers, MD 10/27/2016 7:42 AM

## 2016-10-27 NOTE — Progress Notes (Signed)
Social Work Patient ID: [name], male   DOB: 07/17/1930, 83 y.o.   MRN: [id number]  Rexene Alberts Social Worker Signed   Patient Care Conference Date of Service: 10/27/2016  1:11 PM      Hide copied text Hover for attribution information Inpatient RehabilitationTeam Conference and Plan of Care Update Date: 10/26/2016   Time: 2:20 PM      Patient Name: [name]      Medical Record Number: [id number]  Date of Birth: 17-Sep-1930 Sex: Male         Room/Bed: 4W10C/4W10C-01 Payor Info: Payor: MEDICARE / Plan: MEDICARE PART A AND B / Product Type: *No Product type* /     Admitting Diagnosis: CVA  Admit Date/Time:  10/18/2016  4:44 PM Admission Comments: No comment available    Primary Diagnosis:  Acute encephalopathy Principal Problem: Acute encephalopathy       Patient Active Problem List    Diagnosis Date Noted  . Acute encephalopathy 10/18/2016  . Tick bite of calf, initial encounter    . Stage 3 chronic kidney disease    . Diabetes mellitus type 2 in nonobese (HCC)    . Coronary artery disease involving native coronary artery of native heart without angina pectoris    . Dementia without behavioral disturbance    . Chronic systolic heart failure ([place])    . Debilitated 11/18/2015  . Neuropathic pain    . Tachycardia    . Pacemaker    . DM type 2 with diabetic peripheral neuropathy ([place])    . Benign essential HTN    . History of syncope    . LBBB (left bundle branch block)    . Acute blood loss anemia    . NSVT (nonsustained ventricular tachycardia) ([place])    . Bradycardia, severe sinus 11/13/2015  . Complete heart block ([place])    . Chronic combined systolic and diastolic heart failure ([place]) 04/27/2013  . Essential hypertension 04/27/2013  . Left bundle branch block 04/27/2013  . Type II or unspecified type diabetes mellitus with neurological manifestations, uncontrolled 01/15/2013      Expected Discharge Date: Expected Discharge Date: 12-01-16   Team Members  Present: Physician leading conference: Dr. Alger Simons Social Worker Present: Lennart Pall, LCSW Nurse Present: Other (comment) Blair Heys, RN) PT Present: Kem Parkinson, PT OT Present: Roanna Epley, [place], OT SLP Present: Weston Anna, SLP PPS Coordinator present : Daiva Nakayama, RN, CRRN       Current Status/Progress Goal Weekly Team Focus  Medical     fair intake, cbg's improving.   increase functional mobility`  diabetes, nutrition   Bowel/Bladder     Incontinent of B/B. LBM 10/24/16.  Patient to be continent of bowel and bladder while on IPR.  Assess bowel and bladder needs.  Consider a toileting schedule.   Swallow/Nutrition/ Hydration     Total A with dysphagia 1 and nectar thick liquids  Max A - downgraded 6/18  Safety with PO intake   ADL's     max A for functional transfers, max A sit<>stand, max/tot A for BADLs secondary limited awareness and impaired cognition  min A overall  cognitive remeidation, sit<>stand, functional transfers, standing balance, family educaiton   Mobility     modA bed mobility, modA transfers, min/modA gait with RW however fluctuates with attention/arousal  min/supervision overall  activity tolerance, bed mobility, transfer and gait training, dynamic standing balance, neuro re-ed, cognitive remediation   Communication  Safety/Cognition/ Behavioral Observations   Max to Total Assist, limited by extremely brief peirods of sustained attention  Max A - downgraded 6/18  arousal, attention to basic, familiar tasks   Pain     Patient denies pain at this time.  No c/o pain.  Patient to maintain no c/o pain.  Patient to have pain level <3 while on IPR.  Continue to assess pain q shift and PRN.   Skin     Patient skin integrity is clean/dry/intact.  Patient to maintain intact skin integrity while on IPR.  Continue to assess skin q shift and PRN.     *See Care Plan and progress notes for long and short-term goals.     Barriers to Discharge: safety, balance, cognition     Possible Resolutions to Barriers:  continued care giver educatin, adaptive equipment     Discharge Planning/Teaching Needs:  Daughter plans for pt to return home with her and she will provide 24/7 assistance.  Will need to be scheduled with daughter.   Team Discussion:  Dementia becoming more prevalent and doubtful will see much cognitive improvement per MD.  Sit - stands can range from supervision/ min assist to max/ total dependent on his cognitive state at the time.  Still total assist with feeding on D1, nectar diet.  Anticipate will continue to range in assist need at home.  SW to follow up with daughter about her ability to manage him at his max/ total assist days.  Revisions to Treatment Plan:  Some goals downgraded.    Continued Need for Acute Rehabilitation Level of Care: The patient requires daily medical management by a physician with specialized training in physical medicine and rehabilitation for the following conditions: Daily direction of a multidisciplinary physical rehabilitation program to ensure safe treatment while eliciting the highest outcome that is of practical value to the patient.: Yes Daily medical management of patient stability for increased activity during participation in an intensive rehabilitation regime.: Yes Daily analysis of laboratory values and/or radiology reports with any subsequent need for medication adjustment of medical intervention for : Neurological problems;Nutritional problems;Diabetes problems   Rena Sweeden 10/27/2016, 1:11 PM      Lowella Curb, [place] Worker Signed   Patient Care Conference Date of Service: 10/21/2016 10:25 AM      Hide copied text Hover for attribution information Inpatient RehabilitationTeam Conference and Plan of Care Update Date: 10/19/2016   Time: 2:15 PM      Patient Name: [name]      Medical Record Number: [id number]  Date of Birth:  1930/09/23 Sex: Male         Room/Bed: 4W10C/4W10C-01 Payor Info: Payor: MEDICARE / Plan: MEDICARE PART A AND B / Product Type: *No Product type* /     Admitting Diagnosis: CVA  Admit Date/Time:  10/18/2016  4:44 PM Admission Comments: No comment available    Primary Diagnosis:  <principal problem not specified> Principal Problem: <principal problem not specified>       Patient Active Problem List    Diagnosis Date Noted  . Acute encephalopathy 10/18/2016  . Tick bite of calf, initial encounter    . Stage 3 chronic kidney disease    . Diabetes mellitus type 2 in nonobese (HCC)    . Coronary artery disease involving native coronary artery of native heart without angina pectoris    . Dementia without behavioral disturbance    . Chronic systolic heart failure ([place])    . Debilitated 11/18/2015  .  Neuropathic pain    . Tachycardia    . Pacemaker    . DM type 2 with diabetic peripheral neuropathy ([place])    . Benign essential HTN    . History of syncope    . LBBB (left bundle branch block)    . Acute blood loss anemia    . NSVT (nonsustained ventricular tachycardia) ([place])    . Bradycardia, severe sinus 11/13/2015  . Complete heart block ([place])    . Chronic combined systolic and diastolic heart failure ([place]) 04/27/2013  . Essential hypertension 04/27/2013  . Left bundle branch block 04/27/2013  . Type II or unspecified type diabetes mellitus with neurological manifestations, uncontrolled 01/15/2013      Expected Discharge Date: Expected Discharge Date:  (20-25 days)   Team Members Present: Physician leading conference: Dr. Alger Simons Social Worker Present: Lennart Pall, LCSW Nurse Present: Rayetta Pigg, RN PT Present: Kem Parkinson, PT OT Present: Roanna Epley, COTA;Stephanie Schlosser, OT SLP Present: Gunnar Fusi, SLP PPS Coordinator present : Daiva Nakayama, RN, CRRN       Current Status/Progress Goal Weekly Team Focus  Medical     pica infarct, encephalopathy, dm,  ckd  improve balance and functional mobility  renal, kidneys, nutrition   Bowel/Bladder     incontinent of bowel and bladder LBM 10-16-16  Continent of bowel and bladder, regular bowel pattern  timed tolieting q 2   Swallow/Nutrition/ Hydration     Regular, textures; thin liquids: needs full supervision due to cognition   Supervision   safety with PO intake    ADL's     max A for functional transfers, max A sit<>stand, tot A for LB dressing, min A UB dressing, max verbal cues for attention to task, max A for orientaiton  min A overall  cognitive remediation, sit<>stand, standing balance, attention, functional transfers   Mobility     max to +2 assist  min/supervision overall  cognitive remediation, endurance, transfers, initiation of gait/stairs, neuro re-ed for postural control and balance   Communication     functional communication is impacted by cognition          Safety/Cognition/ Behavioral Observations   Max to total assist, very limited by lethargy and fleeting sustained attention on eval  Mod Assist   arousal, orientation, attention to basic, familiar tasks    Pain     no c/o pain (tylenol 650)  0  Assess pain q shift and prn   Skin     no skin issues   no new skin issues   turn schedule, asses skini q shift and prn     Rehab Goals Patient on target to meet rehab goals: Yes *See Care Plan and progress notes for long and short-term goals.   Barriers to Discharge: safety awareness, balance     Possible Resolutions to Barriers:  adative equipment and education     Discharge Planning/Teaching Needs:  Plan to d/c home with daughter vs SNF if she feels she cannot meet care needs.      Team Discussion:  New eval.  BSs variable.  Total  - max assist on eval with min assist goals overall.  Significant cognitive deficits.  SW still to confirm d/c plan.  Revisions to Treatment Plan:  None    Continued Need for Acute Rehabilitation Level of Care: The patient requires daily  medical management by a physician with specialized training in physical medicine and rehabilitation for the following conditions: Daily direction of a multidisciplinary physical rehabilitation  program to ensure safe treatment while eliciting the highest outcome that is of practical value to the patient.: Yes Daily medical management of patient stability for increased activity during participation in an intensive rehabilitation regime.: Yes Daily analysis of laboratory values and/or radiology reports with any subsequent need for medication adjustment of medical intervention for : Neurological problems;Blood pressure problems   Beza Steppe 10/21/2016, 10:26 AM      Lowella Curb, LCSW Social Worker Signed   Patient Care Conference Date of Service: 10/19/2016  4:49 PM      Hide copied text Hover for attribution information Inpatient RehabilitationTeam Conference and Plan of Care Update Date: 10/19/2016   Time: 2:15 PM      Patient Name: [name]      Medical Record Number: [id number]  Date of Birth: 21-Jun-1930 Sex: Male         Room/Bed: 4W10C/4W10C-01 Payor Info: Payor: MEDICARE / Plan: MEDICARE PART A AND B / Product Type: *No Product type* /     Admitting Diagnosis: CVA  Admit Date/Time:  10/18/2016  4:44 PM Admission Comments: No comment available    Primary Diagnosis:  <principal problem not specified> Principal Problem: <principal problem not specified>       Patient Active Problem List    Diagnosis Date Noted  . Acute encephalopathy 10/18/2016  . Tick bite of calf, initial encounter    . Stage 3 chronic kidney disease    . Diabetes mellitus type 2 in nonobese (HCC)    . Coronary artery disease involving native coronary artery of native heart without angina pectoris    . Dementia without behavioral disturbance    . Chronic systolic heart failure ([place])    . Debilitated 11/18/2015  . Neuropathic pain    . Tachycardia    . Pacemaker    . DM type 2 with diabetic peripheral  neuropathy ([place])    . Benign essential HTN    . History of syncope    . LBBB (left bundle branch block)    . Acute blood loss anemia    . NSVT (nonsustained ventricular tachycardia) ([place])    . Bradycardia, severe sinus 11/13/2015  . Complete heart block ([place])    . Chronic combined systolic and diastolic heart failure ([place]) 04/27/2013  . Essential hypertension 04/27/2013  . Left bundle branch block 04/27/2013  . Type II or unspecified type diabetes mellitus with neurological manifestations, uncontrolled 01/15/2013      Expected Discharge Date: Expected Discharge Date:  (20-25 days)   Team Members Present: Physician leading conference: Dr. Alger Simons Social Worker Present: Lennart Pall, LCSW Nurse Present: Rayetta Pigg, RN PT Present: Kem Parkinson, PT OT Present: Roanna Epley, COTA;Stephanie Schlosser, OT SLP Present: Gunnar Fusi, SLP PPS Coordinator present : Daiva Nakayama, RN, CRRN       Current Status/Progress Goal Weekly Team Focus  Medical     pica infarct, encephalopathy, dm, ckd  improve balance and functional mobility  renal, kidneys, nutrition   Bowel/Bladder     incontinent of bowel and bladder LBM 10-16-16  Continent of bowel and bladder, regular bowel pattern  timed tolieting q 2   Swallow/Nutrition/ Hydration     Regular, textures; thin liquids: needs full supervision due to cognition   Supervision   safety with PO intake    ADL's     max A for functional transfers, max A sit<>stand, tot A for LB dressing, min A UB dressing, max verbal cues for attention to task, max A  for orientaiton  min A overall  cognitive remediation, sit<>stand, standing balance, attention, functional transfers   Mobility     max to +2 assist  min/supervision overall  cognitive remediation, endurance, transfers, initiation of gait/stairs, neuro re-ed for postural control and balance   Communication     functional communication is impacted by cognition          Safety/Cognition/  Behavioral Observations   Max to total assist, very limited by lethargy and fleeting sustained attention on eval  Mod Assist   arousal, orientation, attention to basic, familiar tasks    Pain     no c/o pain (tylenol 650)  0  Assess pain q shift and prn   Skin     no skin issues   no new skin issues   turn schedule, asses skini q shift and prn     Rehab Goals Patient on target to meet rehab goals: Yes *See Care Plan and progress notes for long and short-term goals.   Barriers to Discharge: safety awareness, balance     Possible Resolutions to Barriers:  adative equipment and education     Discharge Planning/Teaching Needs:  Plan to d/c home with daughter vs SNF if she feels she cannot meet care needs.      Team Discussion:  New eval.  BSs extremely variable - MD aware.  Total - max assist on evels and hope to reach min assist goals overall.  Est 20-25 d LOS  Revisions to Treatment Plan:  None    Continued Need for Acute Rehabilitation Level of Care: The patient requires daily medical management by a physician with specialized training in physical medicine and rehabilitation for the following conditions: Daily direction of a multidisciplinary physical rehabilitation program to ensure safe treatment while eliciting the highest outcome that is of practical value to the patient.: Yes Daily medical management of patient stability for increased activity during participation in an intensive rehabilitation regime.: Yes Daily analysis of laboratory values and/or radiology reports with any subsequent need for medication adjustment of medical intervention for : Neurological problems;Blood pressure problems   Lucah Petta 10/20/2016, 4:40 PM

## 2016-10-28 ENCOUNTER — Inpatient Hospital Stay (HOSPITAL_COMMUNITY): Payer: Medicare Other

## 2016-10-28 ENCOUNTER — Inpatient Hospital Stay (HOSPITAL_COMMUNITY): Payer: Medicare Other | Admitting: Physical Therapy

## 2016-10-28 ENCOUNTER — Inpatient Hospital Stay (HOSPITAL_COMMUNITY): Payer: Medicare Other | Admitting: Speech Pathology

## 2016-10-28 LAB — BASIC METABOLIC PANEL
ANION GAP: 14 (ref 5–15)
BUN: 27 mg/dL — ABNORMAL HIGH (ref 6–20)
CHLORIDE: 115 mmol/L — AB (ref 101–111)
CO2: 17 mmol/L — AB (ref 22–32)
Calcium: 9.4 mg/dL (ref 8.9–10.3)
Creatinine, Ser: 1.31 mg/dL — ABNORMAL HIGH (ref 0.61–1.24)
GFR calc non Af Amer: 49 mL/min — ABNORMAL LOW (ref >60)
GFR, EST AFRICAN AMERICAN: 56 mL/min — AB (ref >60)
GLUCOSE: 117 mg/dL — AB (ref 65–99)
POTASSIUM: 4.4 mmol/L (ref 3.5–5.1)
Sodium: 146 mmol/L — ABNORMAL HIGH (ref 135–145)

## 2016-10-28 LAB — GLUCOSE, CAPILLARY
GLUCOSE-CAPILLARY: 92 mg/dL (ref 65–99)
GLUCOSE-CAPILLARY: 59 mg/dL — AB (ref 65–99)
GLUCOSE-CAPILLARY: 77 mg/dL (ref 65–99)
GLUCOSE-CAPILLARY: 151 mg/dL — AB (ref 65–99)
Glucose-Capillary: 157 mg/dL — ABNORMAL HIGH (ref 65–99)

## 2016-10-28 NOTE — Progress Notes (Signed)
Occupational Therapy Session Note  Patient Details  Name: [name] MRN: [id number] Date of Birth: 1930-08-09  Today's Date: 10/28/2016 OT Individual Time: 0900-1000 OT Individual Time Calculation (min): 60 min    Short Term Goals: Week 2:  OT Short Term Goal 1 (Week 2): Pt will complete stand pivot transfer to toilet with ModA OT Short Term Goal 2 (Week 2): Pt will demonstrate sustained attention for completing at least 2/3 seated grooming ADL tasks. OT Short Term Goal 3 (Week 2): Pt will complete LB dressing with ModA.  OT Short Term Goal 4 (Week 2): Pt will demonstrate dynamic sitting balance with MinA during functional task completion.   Skilled Therapeutic Interventions/Progress Updates:    Pt resting in bed upon arrival.  Pt required mod A for supine>sit EOB.  Pt sat EOB for UB dressing tasks this morning.  Pt was able to maintain sitting balance at supervision level but required max multimodal cues to initiate task and complete donning shirt.  Pt required max A for squat pivot transfer to w/c.  Pt unable to initiate task and resistant to transfer.  Pt required tot A for threading pants over BLe and donning socks. Pt performed sit<>stand from w/c at sink with min A to initiate task to facilitate pulling up pants. Attempted to engaged pt in shaving task at sink but pt unable to attend to task and required tot A to complete task.  Pt remained in w/c at nursing station with QRB in place.   Therapy Documentation Precautions:  Precautions Precautions: Fall, ICD/Pacemaker Restrictions Weight Bearing Restrictions: No Pain:  Pt with no s/s of pain  See Function Navigator for Current Functional Status.   Therapy/Group: Individual Therapy  Leroy Libman 10/28/2016, 3:18 PM

## 2016-10-28 NOTE — Progress Notes (Signed)
Speech Language Pathology Daily Session Note  Patient Details  Name: [name] MRN: [id number] Date of Birth: 03/21/1931  Today's Date: 10/28/2016 SLP Individual Time: 3716-9678 SLP Individual Time Calculation (min): 25 min  Short Term Goals: Week 2: SLP Short Term Goal 1 (Week 2): Pt will consume therapeutic trials of thin liquids with max assist multimodal cues for use of swallowing precautions.   SLP Short Term Goal 2 (Week 2): Pt will maintain alertness for 1 minute intervals with max assist multimodal cues.   SLP Short Term Goal 3 (Week 2): Pt will sustain his attention to task for 30 second intervals with max assist multimodal cues for redirection.   SLP Short Term Goal 4 (Week 2): Pt will utilize external aids to orient to place, date, and situation with max assist multimodal cues.   SLP Short Term Goal 5 (Week 2): Pt will consume dys 1 textures and nectar liquids with Max assist verbal cues for use of swallowing precautions and minimal overt s/s of aspiration   Skilled Therapeutic Interventions: Skilled treatment session focused on cognitive goals. SLP facilitated session by providing Max A verbal cues for focused attention to task for ~30 second intervals without falling asleep. Patient unable to follow basic commands despite hand over hand assist and familiar auditory stimuli (music). Patient verbalizing at the phrase level but was only ~50% intelligible. Patient left upright in wheelchair, asleep at RN station. Continue with current plan of care.      Function:  Eating Eating   Modified Consistency Diet: Yes Eating Assist Level: Supervision or verbal cues;Helper scoops food on utensil;Helper brings food to mouth   Eating Set Up Assist For: Opening containers;Cutting food Helper [name] on Utensil: Every scoop Helper [name] to Mouth: Every scoop   Cognition Comprehension Comprehension assist level: Understands basic less than 25% of the time/ requires cueing >75%  of the time  Expression   Expression assist level: Expresses basis less than 25% of the time/requires cueing >75% of the time.  Social Interaction Social Interaction assist level: Interacts appropriately less than 25% of the time. May be withdrawn or combative.  Problem Solving Problem solving assist level: Solves basic less than 25% of the time - needs direction nearly all the time or does not effectively solve problems and may need a restraint for safety  Memory Memory assist level: Recognizes or recalls less than 25% of the time/requires cueing greater than 75% of the time    Pain No indications of pain   Therapy/Group: Individual Therapy  Samit Sylve 10/28/2016, 3:00 PM

## 2016-10-28 NOTE — Progress Notes (Signed)
Physical Therapy Session Note  Patient Details  Name: [name] MRN: [id number] Date of Birth: 17-Dec-1930  Today's Date: 10/27/2016 PT Individual Time:1500-1600   60 min   Short Term Goals: Week 2:  PT Short Term Goal 1 (Week 2): Pt will transfer with consistent minA PT Short Term Goal 2 (Week 2): Pt will perform bed mobility with minA PT Short Term Goal 3 (Week 2): Pt will perform gait x50' with consistent minA PT Short Term Goal 4 (Week 2): Pt will attend to functional mobility task >2 min with minimal cueing   Skilled Therapeutic Interventions/Progress Updates:    Pt received sitting in WC and agreeable to PT  Pt instructed in transfer to nustep with mod assist. Nutsep for sustained attention task x 10 minutes with min-mod assist to attend and remain awake. Pt noted to fall asleep without auditory stimulation. Gait training with RW for 32f and 925fwith mod assist to control AD. Pt noted to have 3 instances of mild knee buckling but able to correct without LOB. Prolonged rest break following each bout of gait training.   Pt returned to room and performed  ambulatory transfer to bed with mod assist. Sit>supine completed with mod assist from PT. Pt left supine in bed with call bell in reach and all needs met.    Therapy Documentation Precautions:  Precautions Precautions: Fall, ICD/Pacemaker Restrictions Weight Bearing Restrictions: No Vital Signs: Therapy Vitals Temp: 98.2 F (36.8 C) Temp Source: Oral Pulse Rate: 75 Resp: 20 BP: (!) 152/83 Patient Position (if appropriate): Lying Oxygen Therapy SpO2: 100 % O2 Device: Not Delivered   See Function Navigator for Current Functional Status.   Therapy/Group: Individual Therapy  AuLorie Phenix/21/2018, 6:06 AM

## 2016-10-28 NOTE — Progress Notes (Signed)
Physical Therapy Session Note  Patient Details  Name: [name] MRN: [id number] Date of Birth: 1931-04-21  Today's Date: 10/28/2016 PT Individual Time: 1030-1125 PT Individual Time Calculation (min): 55 min   Short Term Goals: Week 2:  PT Short Term Goal 1 (Week 2): Pt will transfer with consistent minA PT Short Term Goal 2 (Week 2): Pt will perform bed mobility with minA PT Short Term Goal 3 (Week 2): Pt will perform gait x50' with consistent minA PT Short Term Goal 4 (Week 2): Pt will attend to functional mobility task >2 min with minimal cueing   Skilled Therapeutic Interventions/Progress Updates:  Pt received seated in w/c; total A to rehab gym. Pt requiring multimodal cues for arousal and attention throughout session. Pt sit>stand mod A with RW and posterior lean in standing. Pt ambulated 7ft with RW and mod A with manual facilitation at hips to promote extension and anterior weight shift. Slow gait speed with significantly shortened step length. Pt performed boxing seated in w/c and was able to sustain attention to this activity for 30 seconds x2. Sit>stand mod A with RW and standing boxing x 2 min intermittently alternating UE with mod A to maintain balance. Attempted to stand 1 more trial but unable to sustain attention for sit>stand. Total A to pt's room in w/c. After multimodal cues for attention, sit>stand with max A and stand pivot transfer mod A with RW from w/c>bed with verbal cues for sequence. Max A +2 sit>supine due to fatigue and decreased attention. Pt left supine in bed with call bell in reach, bed alarm on, and 3 rails.   Therapy Documentation Precautions:  Precautions Precautions: Fall, ICD/Pacemaker Restrictions Weight Bearing Restrictions: No   See Function Navigator for Current Functional Status.   Therapy/Group: Individual Therapy  Alysia Penna 10/28/2016, 12:58 PM

## 2016-10-28 NOTE — Progress Notes (Signed)
Occupational Therapy Note  Patient Details  Name: [name] MRN: [id number] Date of Birth: 04/21/1931  Today's Date: 10/28/2016 OT Individual Time: 1300-1330 OT Individual Time Calculation (min): 30 min   Pt denies pain Individual Therapy  Pt asleep in bed upon arrival and required mod multimodal cues to arouse.  Pt initially demonstrated difficulty keeping eyes open.  Pt noted with increased arousal/alertness after sitting EOB in preparation for transfer to w/c.  Pt required mod A for supine>sit EOB.  Pt required max A for squat pivot transfer to w/c.  Pt unable to initiate sit<>stand for transfer and required max multimodal cues/A to initiate task.  Pt required min A for sit<>stand from w/c sitting at sink.  Pt places BUE on hands for support with task and is able to stand at sink with close supervision and BUE support while pants are adjusted.  Pt transitioned to therapy gym and attempted to engage patient in simple one step activities.  Pt unable to attend to request/task and unable to initiate task.  Pt returned to nursing station and remained in w/c with QRB in place.  Focus on activity tolerance, arousal, task initiation, and attention to task to increase independence with BADLs.    [name] [facility] 10/28/2016, 2:55 PM

## 2016-10-28 NOTE — Plan of Care (Signed)
Problem: RH BOWEL ELIMINATION Goal: RH STG MANAGE BOWEL WITH ASSISTANCE STG Manage Bowel with mod Assistance.  Outcome: Not Progressing Total care, incontinent  Problem: RH BLADDER ELIMINATION Goal: RH STG MANAGE BLADDER WITH ASSISTANCE STG Manage Bladder With mod Assistance  Outcome: Not Progressing incontinenet  Problem: RH SKIN INTEGRITY Goal: RH STG SKIN FREE OF INFECTION/BREAKDOWN Patients skin will remain free from further breakdown or infection with mod assist.  Outcome: Progressing No skin issues noted  Problem: RH SAFETY Goal: RH STG ADHERE TO SAFETY PRECAUTIONS W/ASSISTANCE/DEVICE STG Adhere to Safety Precautions With mod Assistance/Device.  Outcome: Progressing On low bed, mats on both sides of bed, safety precautions maintained  Problem: RH COGNITION-NURSING Goal: RH STG USES MEMORY AIDS/STRATEGIES W/ASSIST TO PROBLEM SOLVE STG Uses Memory Aids/Strategies With mod Assistance to Problem Solve.  Outcome: Not Progressing Does not follow commands, very confused  Goal: RH STG ANTICIPATES NEEDS/CALLS FOR ASSIST W/ASSIST/CUES STG Anticipates Needs/Calls for Assist With mod Assistance/Cues.  Outcome: Not Progressing unable to call due to cognitive deficits

## 2016-10-29 ENCOUNTER — Ambulatory Visit (HOSPITAL_COMMUNITY): Payer: Medicare Other | Admitting: Physical Therapy

## 2016-10-29 ENCOUNTER — Inpatient Hospital Stay (HOSPITAL_COMMUNITY): Payer: Medicare Other | Admitting: Physical Therapy

## 2016-10-29 ENCOUNTER — Encounter (HOSPITAL_COMMUNITY): Payer: Medicare Other | Admitting: Occupational Therapy

## 2016-10-29 ENCOUNTER — Inpatient Hospital Stay (HOSPITAL_COMMUNITY): Payer: Medicare Other | Admitting: Speech Pathology

## 2016-10-29 ENCOUNTER — Encounter (HOSPITAL_COMMUNITY): Payer: Self-pay

## 2016-10-29 LAB — GLUCOSE, CAPILLARY
GLUCOSE-CAPILLARY: 123 mg/dL — AB (ref 65–99)
GLUCOSE-CAPILLARY: 61 mg/dL — AB (ref 65–99)
Glucose-Capillary: 120 mg/dL — ABNORMAL HIGH (ref 65–99)
Glucose-Capillary: 85 mg/dL (ref 65–99)
Glucose-Capillary: 67 mg/dL (ref 65–99)
Glucose-Capillary: 92 mg/dL (ref 65–99)

## 2016-10-29 NOTE — Progress Notes (Signed)
Occupational Therapy Session Note  Patient Details  Name: ITAY MELLA MRN: [id number] Date of Birth: August 05, 1930  Today's Date: 10/29/2016 OT Individual Time:  -       Short Term Goals: Week 1:  OT Short Term Goal 1 (Week 1): Pt will complete stand pivot transfer to toilet with [place].  OT Short Term Goal 1 - Progress (Week 1): Progressing toward goal OT Short Term Goal 2 (Week 1): Pt will demonstrate sustained attention for completing at least 2/3 seated grooming ADL tasks. OT Short Term Goal 2 - Progress (Week 1): Progressing toward goal OT Short Term Goal 3 (Week 1): Pt will complete LB dressing with ModA.  OT Short Term Goal 3 - Progress (Week 1): Progressing toward goal OT Short Term Goal 4 (Week 1): Pt will demonstrate dynamic sitting balance with MinA during functional task completion.  OT Short Term Goal 4 - Progress (Week 1): Progressing toward goal  Skilled Therapeutic Interventions/Progress Updates:    1:1 Pt in bed when arrived. With [facility] elevated performed oral hygiene with suction and placed in dentures. Focus on bed mobility to come to EOB. Pt able to come to EOB with max A with extra time. Pt able to sustain attention to continued to come to EOB but still required A due to difficulty forward weight shift. Pt Performed sit to stand with mod A with strong posterior lean form EOB. Ambulated with HHA with +2 to the bathroom with facilitation for forward weight shifts and standing balance. Performed stand to sit with mod A and toileting with total A. Pt ambulated with max A with RW +1 back out of the bathroom into the w/c.  Transitioned to the ADL apartment and focus on sorting activity  with familiar silverware with initially hand over hand A and max A for for completion of task. Discussion with niece about progress today.  Daughter did not attend session.  Therapy Documentation Precautions:  Precautions Precautions: Fall, ICD/Pacemaker Restrictions Weight Bearing  Restrictions: No   Pain:  no c/o pain   ADL: ADL ADL Comments: see functional navigator   See Function Navigator for Current Functional Status.   Therapy/Group: Individual Therapy  Willeen Cass [facility] 10/29/2016, 3:02 PM

## 2016-10-29 NOTE — Significant Event (Signed)
Hypoglycemic Event  CBG:61  Treatment: 15 GM carbohydrate snack  Symptoms: None  Follow-up CBG: Time:2220 CBG Result:67                                     0923                         30 Possible Reasons for Event: Inadequate meal intake  Comments/MD notified:Will notify in am pt responded to treatment    Delana Meyer

## 2016-10-29 NOTE — Progress Notes (Signed)
Speech Language Pathology Daily Session Note  Patient Details  Name: [name] MRN: [id number] Date of Birth: 1931-04-08  Today's Date: 10/29/2016 SLP Individual Time: 0730-0800 SLP Individual Time Calculation (min): 30 min  Short Term Goals: Week 2: SLP Short Term Goal 1 (Week 2): Pt will consume therapeutic trials of thin liquids with max assist multimodal cues for use of swallowing precautions.   SLP Short Term Goal 2 (Week 2): Pt will maintain alertness for 1 minute intervals with max assist multimodal cues.   SLP Short Term Goal 3 (Week 2): Pt will sustain his attention to task for 30 second intervals with max assist multimodal cues for redirection.   SLP Short Term Goal 4 (Week 2): Pt will utilize external aids to orient to place, date, and situation with max assist multimodal cues.   SLP Short Term Goal 5 (Week 2): Pt will consume dys 1 textures and nectar liquids with Max assist verbal cues for use of swallowing precautions and minimal overt s/s of aspiration   Skilled Therapeutic Interventions: Skilled treatment session focused on cognition and dysphagia goals. SLP facilitated session by providing skilled observation of dysphagia 1 breakfast tray with nectar thick liquids via spoon. Pt with continued increased oral phase d/t decreased awareness of bolus and decreased oral manipulation of bolus. Liquid wash helpful in initiating a swallow and promoting oral clearance of puree. Pt with significant confusion, not responsive to cues for redirection, significantly decreased speech intelligibility and nonsensical statements. Pt consumed ~ 6 bites before falling asleep. All food cleared from mouth. Pt left in bed, lower to lowest position and all needs within reach. Pt continues to exhibit decline in cognitive function impacting his ability to consume larger quantities of PO safety. Continue per current plan of care.      Function:  Eating Eating   Modified Consistency Diet: Yes Eating  Assist Level: Helper feeds patient   Eating Set Up Assist For: Opening containers;Cutting food Helper [place] on Utensil: Every scoop Helper Brings Food to Mouth: Every scoop   Cognition Comprehension Comprehension assist level: Understands basic less than 25% of the time/ requires cueing >75% of the time  Expression   Expression assist level: Expresses basis less than 25% of the time/requires cueing >75% of the time.  Social Interaction Social Interaction assist level: Interacts appropriately less than 25% of the time. May be withdrawn or combative.  Problem Solving Problem solving assist level: Solves basic less than 25% of the time - needs direction nearly all the time or does not effectively solve problems and may need a restraint for safety  Memory Memory assist level: Recognizes or recalls less than 25% of the time/requires cueing greater than 75% of the time    Pain    Therapy/Group: Individual Therapy  Rishikesh Khachatryan [place] Nail 10/29/2016, 8:36 AM

## 2016-10-29 NOTE — Progress Notes (Addendum)
Social Work Patient ID: [name], male   DOB: 1931/02/27, 83 y.o.   MRN: [id number]  Met with niece-Marva who expressed concerns regarding pt going home from here. She feels he needs to go to a SNF to continue To have his needs met and continue his rehab. She feels he is not getting the proper care at home and he can not go home from here. His brother is coming this afternoon and both plan to talk with RN and observe Him in his therapies. Niece has left her contact information and wants Lucy-SW to contact her, she also asked for  NH list and will begin looking for a facility for him. Will have Lucy follow up on Monday with them and confirm The discharge plan. Daughter was suppose to be here to go through therapies with pt and she is not here.

## 2016-10-29 NOTE — Progress Notes (Signed)
Speech Language Pathology Daily Make-up Session Note  Patient Details  Name: [name] MRN: [id number] Date of Birth: Feb 14, 1931  Today's Date: 10/29/2016 SLP Individual Time: 1430-1500 SLP Individual Time Calculation (min): 30 min  Short Term Goals: Week 2: SLP Short Term Goal 1 (Week 2): Pt will consume therapeutic trials of thin liquids with max assist multimodal cues for use of swallowing precautions.   SLP Short Term Goal 2 (Week 2): Pt will maintain alertness for 1 minute intervals with max assist multimodal cues.   SLP Short Term Goal 3 (Week 2): Pt will sustain his attention to task for 30 second intervals with max assist multimodal cues for redirection.   SLP Short Term Goal 4 (Week 2): Pt will utilize external aids to orient to place, date, and situation with max assist multimodal cues.   SLP Short Term Goal 5 (Week 2): Pt will consume dys 1 textures and nectar liquids with Max assist verbal cues for use of swallowing precautions and minimal overt s/s of aspiration   Skilled Therapeutic Interventions: Skilled treatment session focused on cognitive goals. Upon arrival, patient was awake while supine in bed with visitors present. Patient agreeable to therapy but required total A to initiate sitting EOB and Max +2 assist to transfer to wheelchair via the [place]. Patient initially engaged with visitors but quickly fatigued and required Max A multimodal cues for arousal, following 1 step commands and participation in a basic conversation. Patient max alertness was for ~10 minutes during transfer. Patient left upright in wheelchair with quick release belt in place and all needs within reach with visitors present. Continue with current plan of care.      Function:  Eating Eating                 Cognition Comprehension Comprehension assist level: Understands basic less than 25% of the time/ requires cueing >75% of the time  Expression   Expression assist level: Expresses basis  less than 25% of the time/requires cueing >75% of the time.  Social Interaction Social Interaction assist level: Interacts appropriately less than 25% of the time. May be withdrawn or combative.  Problem Solving Problem solving assist level: Solves basic less than 25% of the time - needs direction nearly all the time or does not effectively solve problems and may need a restraint for safety  Memory Memory assist level: Recognizes or recalls less than 25% of the time/requires cueing greater than 75% of the time    Pain No/Denies Pain   Therapy/Group: Individual Therapy  Rishard Delange 10/29/2016, 3:10 PM

## 2016-10-29 NOTE — Progress Notes (Signed)
[place] PHYSICAL MEDICINE & REHABILITATION     PROGRESS NOTE    Subjective/Complaints: The patient remains confused. He is cooperative with PT, OT. Family coming in today to assess care needs and determine whether they feel they can divide the necessary support at home.  ROS: Limited due to cognitive/behavioral    Objective: Vital Signs: Blood pressure 130/76, pulse 80, temperature 97.6 F (36.4 C), temperature source Axillary, resp. rate 20, height 5\' 10"  (1.778 m), weight 74.7 kg (164 lb 9.6 oz), SpO2 99 %. No results found.  Recent Labs  10/26/16 1057  WBC 7.5  HGB 15.3  HCT 45.2  PLT 196    Recent Labs  10/27/16 0612 10/28/16 0913  NA 147* 146*  K 4.9 4.4  CL 113* 115*  GLUCOSE 102* 117*  BUN 23* 27*  CREATININE 1.29* 1.31*  CALCIUM 9.6 9.4   CBG (last 3)   Recent Labs  10/28/16 1636 10/28/16 1723 10/28/16 2136  GLUCAP 59* 77 151*    Wt Readings from Last 3 Encounters:  10/29/16 74.7 kg (164 lb 9.6 oz)  10/16/16 75 kg (165 lb 5.5 oz)  09/03/16 79.4 kg (175 lb)    Physical Exam:  Constitutional: no distress HENT:  Head: Normocephalic and atraumatic.  Eyes: EOMI  Neck: Normal range of motion. Neck supple. No thyromegaly present.  Cardiovascular: RRR without murmur. No JVD    Respiratory: CTA Bilaterally without wheezes or rales. Normal effort   GI: Soft. Bowel sounds are normal. He exhibits no distension.  Musculoskeletal: He exhibits no edema or tenderness.  Neurological:  Awake and oriented to self only.  Motor: 4-4+/5 through all 4's.  DTRs 3+ RUE/RLE.   Skin: Skin is warm and dry.  Psychiatric: remains pleasantly confused   Assessment/Plan: 1. Functional and cognitive deficits secondary to encephalopathy and recent right PICA infarct which require 3+ hours per day of interdisciplinary therapy in a comprehensive inpatient rehab setting. Physiatrist is providing close team supervision and 24 hour management of active medical problems  listed below. Physiatrist and rehab team continue to assess barriers to discharge/monitor patient progress toward functional and medical goals.  Function:  Bathing Bathing position Bathing activity did not occur: N/A (night bath) Position: Sitting EOB  Bathing parts Body parts bathed by patient: Right arm, Left arm, Chest, Abdomen, Front perineal area Body parts bathed by helper: Buttocks, Right upper leg, Left upper leg, Right lower leg, Left lower leg  Bathing assist Assist Level: Touching or steadying assistance(Pt > 75%)      Upper Body Dressing/Undressing Upper body dressing   What is the patient wearing?: Pull over shirt/dress     Pull over shirt/dress - Perfomed by patient: Thread/unthread right sleeve Pull over shirt/dress - Perfomed by helper: Thread/unthread left sleeve, Put head through opening, Pull shirt over trunk        Upper body assist Assist Level: Touching or steadying assistance(Pt > 75%)      Lower Body Dressing/Undressing Lower body dressing   What is the patient wearing?: Pants, Non-skid slipper socks   Underwear - Performed by helper: Thread/unthread right underwear leg, Thread/unthread left underwear leg, Pull underwear up/down Pants- Performed by patient: Pull pants up/down Pants- Performed by helper: Thread/unthread right pants leg, Thread/unthread left pants leg, Pull pants up/down   Non-skid slipper socks- Performed by helper: Don/doff right sock, Don/doff left sock                  Lower body assist Assist for lower body  dressing: 2 Helpers      Naval architect activity did not occur: No continent bowel/bladder event   Toileting steps completed by helper: Adjust clothing prior to toileting, Performs perineal hygiene, Adjust clothing after toileting Toileting Assistive Devices: Grab bar or rail  Toileting assist Assist level: Two helpers   Transfers Chair/bed transfer   Chair/bed transfer [place]: Stand pivot Chair/bed  transfer assist level: Moderate assist (Pt 50 - 74%/lift or lower) Chair/bed transfer assistive device: Armrests, Walker Mechanical lift: Ecologist Ambulation activity did not occur: Safety/medical concerns   Max distance: 42ft Assist level: Moderate assist (Pt 50 - 74%)   Wheelchair   Type: Manual Max wheelchair distance: 75 Assist Level: Touching or steadying assistance (Pt > 75%)  Cognition Comprehension Comprehension assist level: Understands basic 25 - 49% of the time/ requires cueing 50 - 75% of the time  Expression Expression assist level: Expresses basic 25 - 49% of the time/requires cueing 50 - 75% of the time. Uses single words/gestures.  Social Interaction Social Interaction assist level: Interacts appropriately 25 - 49% of time - Needs frequent redirection.  Problem Solving Problem solving assist level: Solves basic less than 25% of the time - needs direction nearly all the time or does not effectively solve problems and may need a restraint for safety  Memory Memory assist level: Recognizes or recalls less than 25% of the time/requires cueing greater than 75% of the time   Medical Problem List and Plan: 1.  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   -Continue CIR PT, OT, speech  -likely SNF placement   2.  DVT Prophylaxis/Anticoagulation: Subcutaneous Lovenox.   3. Pain Management: Tylenol as needed 4. Mood: Provide emotional support 5. Neuropsych: This patient Is not capable of making decisions on his own behalf. 6. Skin/Wound Care: Routine skin checks 7. Fluids/Electrolytes/Nutrition:   -potassium normalized -continue to encourage po.on D1/Nectar diet still   - 8. Diabetes mellitus with peripheral neuropathy. Hemoglobin A1c 7.5.    -fair control but intake very inconsistent  -decreased lantus insulin   to 10 units  qam last week---continue at current dose    -continue metformin at current dose    9. CKD stage III. Stable but at risk for worsening with intake issues10. CAD with pacemaker. Continue aspirin. No chest pain or shortness of breath 11. Chronic systolic congestive heart failure. Monitor for any signs of fluid overload   -daily weights===holding at 74.6kg 74.8 last 2 d  12. Hypertension. No current antihypertensive medications.   - Vitals:   10/28/16 1350 10/29/16 0400  BP: 119/78 130/76  Pulse: 79 80  Resp: 20 20  Temp: 97 F (36.1 C) 97.6 F (36.4 C)    -observe only for now 13. Tick exposure.RMSF negative. Completed course of doxycycline   LOS (Days) 11 A FACE TO FACE EVALUATION WAS PERFORMED  Charlett Blake, MD 10/29/2016 6:06 AM

## 2016-10-29 NOTE — Progress Notes (Signed)
Physical Therapy Session Note  Patient Details  Name: [name] MRN: [id number] Date of Birth: 13-Dec-1930  Today's Date: 10/29/2016 PT Individual Time: 0800-0900; 1300-1400 PT Individual Time Calculation (min): 60 min; 60 min   Short Term Goals: Week 2:  PT Short Term Goal 1 (Week 2): Pt will transfer with consistent minA PT Short Term Goal 2 (Week 2): Pt will perform bed mobility with minA PT Short Term Goal 3 (Week 2): Pt will perform gait x50' with consistent minA PT Short Term Goal 4 (Week 2): Pt will attend to functional mobility task >2 min with minimal cueing   Skilled Therapeutic Interventions/Progress Updates:  Tx 1: Pt received awake supine in bed and agreeable to get out of bed. After multimodal cuing to swing legs over the side of the bed pt requesting to sit up on the opposite side of the bed. Mod A supine>sit for LE management and trunk weight shift. Pt with significant posterior lean requiring max A>max A+2 to maintain sitting balance on EOB. Max A+2 squat pivot transfer from bed>w/c with verbal cues for pt to reach forward for w/c. Total A to sink to get dressed. Max A with upper extremity dressing. Pt mod A sit>stand at sink and total A to don pants. After multimodal cues for attention and encouragement to stand at sink again, RW placed in front of pt in attempt to increase pt's attention to standing task; pt started to initiation to standing with RW but unable to sustain attention long enough for sit>stand. Returned to sitting in front of sink to doff pants per OT request to perform dressing during family education today. Pt sit>stand min A at sink with total A to doff pants. New gown donned total A and pt left seated in w/c at nurses station with QRB in place.   Tx 2: Pt received seated in w/c with family present in room, including pt's niece and brother. Pt's niece requesting to observe PT session, but other family members arrived while taking pt total A to rehab gym and  niece was not present for therapy session. Pt mod A+2 for sit>stand for UE placement and boost to stand with RW. Pt ambulated 16ft with min/mod A with RW with intermittent manual facilitation for weight shift and assistance managing RW; demonstrates significantly slowed gait speed and shortened step length and requires frequent verbal cues to sustain attention to task. Pt unsteady when asked pt to lift single UE from RW before stand>sit. Pt ambulated 72ft with RW and mod A and turned to sit on mat table; pt began lowering COM while turning to sit on mat table. Pt required variable max A to min guard to maintain sitting balance; performed forward reaching tasks reaching for ball and placing it in basketball hoop with max cues to maintain attention. Squat pivot transfer max A+2 from mat>w/c; total A to return to pt's room. Sit>stand mod A+2 using stedy; total A stand pivot transfer to bed using stedy. Max A+2 sit>supine due to fatigue. Pt left supine in bed with 3 rails up, call bell in reach, bed alarm activated, and family present.   Therapy Documentation Precautions:  Precautions Precautions: Fall, ICD/Pacemaker Restrictions Weight Bearing Restrictions: No   See Function Navigator for Current Functional Status.   Therapy/Group: Individual Therapy  Alysia Penna 10/29/2016, 3:42 PM

## 2016-10-30 ENCOUNTER — Inpatient Hospital Stay (HOSPITAL_COMMUNITY): Payer: Medicare Other | Admitting: Physical Therapy

## 2016-10-30 DIAGNOSIS — F0150 Vascular dementia without behavioral disturbance: Secondary | ICD-10-CM

## 2016-10-30 LAB — GLUCOSE, CAPILLARY
GLUCOSE-CAPILLARY: 123 mg/dL — AB (ref 65–99)
GLUCOSE-CAPILLARY: 122 mg/dL — AB (ref 65–99)
GLUCOSE-CAPILLARY: 63 mg/dL — AB (ref 65–99)
GLUCOSE-CAPILLARY: 67 mg/dL (ref 65–99)
GLUCOSE-CAPILLARY: 154 mg/dL — AB (ref 65–99)

## 2016-10-30 NOTE — Significant Event (Signed)
Hypoglycemic Event  CBG: 63  Treatment: 15 GM carbohydrate snack  Symptoms: None  Follow-up CBG: Time:1752 CBG Result:67  Possible Reasons for Event: Inadequate meal intake  Comments/MD notified:Dr. Kirsteins, hold metformen    Barrett Shell

## 2016-10-30 NOTE — Progress Notes (Signed)
Physical Therapy Session Note  Patient Details  Name: [name] MRN: [id number] Date of Birth: 01/01/1931  Today's Date: 10/30/2016 PT Individual Time: 1000-1054 PT Individual Time Calculation (min): 54 min   Short Term Goals: Week 2:  PT Short Term Goal 1 (Week 2): Pt will transfer with consistent minA PT Short Term Goal 2 (Week 2): Pt will perform bed mobility with minA PT Short Term Goal 3 (Week 2): Pt will perform gait x50' with consistent minA PT Short Term Goal 4 (Week 2): Pt will attend to functional mobility task >2 min with minimal cueing   Skilled Therapeutic Interventions/Progress Updates: Pt presented in bed awake. Donned pants total assist for time management, pt able to follow command to bridge to facilitate pulling up pants. Pt performed supine to sit with modA with additional time. Max A scooting to EOB. Attempted sit to stand with RW for stand pivot to w/c x 3. Pt becoming fatigued and unable to keep attention to task. Pt transferred to w/c with Stedy modA x 2 as pt unable to maintain LUE on hand bar pushing away despite cues. Pt transported to rehab gym and attempted Kinetron 70cm/sec x 10 for endurance and attention to task. Pt unable to complete >4 reps despite verbal/tactile cues for 3 trials. Pt noted to have decreased arousal towards end of session. Pt returned to nsg station with QRB placed.      Therapy Documentation Precautions:  Precautions Precautions: Fall, ICD/Pacemaker Restrictions Weight Bearing Restrictions: No General:   Vital Signs: Therapy Vitals Temp: 97.6 F (36.4 C) Temp Source: Oral Pulse Rate: 82 Resp: 20 BP: 128/75 Patient Position (if appropriate): Lying Oxygen Therapy SpO2: 99 % O2 Device: Not Delivered Pain:    See Function Navigator for Current Functional Status.   Therapy/Group: Individual Therapy  Eligio Angert  Carlise Stofer, PTA  10/30/2016, 4:23 PM

## 2016-10-30 NOTE — Progress Notes (Signed)
[place] [facility]ICINE & REHABILITATION     PROGRESS NOTE    Subjective/Complaints: Incontinent of bladder, appreciate social work note   ROS: Limited due to cognitive/behavioral    Objective: Vital Signs: Blood pressure 128/70, pulse 80, temperature 97.8 F (36.6 C), temperature source Oral, resp. rate 18, height 5\' 10"  (1.778 m), weight 73.4 kg (161 lb 11.5 oz), SpO2 100 %. No results found. No results for input(s): WBC, HGB, HCT, PLT in the last 72 hours.  Recent Labs  10/28/16 0913  NA 146*  K 4.4  CL 115*  GLUCOSE 117*  BUN 27*  CREATININE 1.31*  CALCIUM 9.4   CBG (last 3)   Recent Labs  10/29/16 2220 10/29/16 2325 10/30/16 0645  GLUCAP 67 92 123*    Wt Readings from Last 3 Encounters:  10/30/16 73.4 kg (161 lb 11.5 oz)  10/16/16 75 kg (165 lb 5.5 oz)  09/03/16 79.4 kg (175 lb)    Physical Exam:  Constitutional: no distress HENT:  Head: Normocephalic and atraumatic.  Eyes: EOMI  Neck: Normal range of motion. Neck supple. No thyromegaly present.  Cardiovascular: RRR without murmur. No JVD    Respiratory: CTA Bilaterally without wheezes or rales. Normal effort   GI: Soft. Bowel sounds are normal. He exhibits no distension.  Musculoskeletal: He exhibits no edema or tenderness.  Neurological:  Awake and oriented to self only.  Motor: 4-4+/5 through all 4's.  DTRs 3+ RUE/RLE.   Skin: Skin is warm and dry.  Psychiatric: remains pleasantly confused   Assessment/Plan: 1. Functional and cognitive deficits secondary to encephalopathy and recent right PICA infarct which require 3+ hours per day of interdisciplinary therapy in a comprehensive inpatient rehab setting. Physiatrist is providing close team supervision and 24 hour management of active medical problems listed below. Physiatrist and rehab team continue to assess barriers to discharge/monitor patient progress toward functional and medical goals.  Function:  Bathing Bathing position  Bathing activity did not occur: N/A (night bath) Position: Sitting EOB  Bathing parts Body parts bathed by patient: Right arm, Left arm, Chest, Abdomen, Front perineal area Body parts bathed by helper: Buttocks, Right upper leg, Left upper leg, Right lower leg, Left lower leg  Bathing assist Assist Level: Touching or steadying assistance(Pt > 75%)      Upper Body Dressing/Undressing Upper body dressing   What is the patient wearing?: Pull over shirt/dress     Pull over shirt/dress - Perfomed by patient: Thread/unthread right sleeve Pull over shirt/dress - Perfomed by helper: Thread/unthread left sleeve, Put head through opening, Pull shirt over trunk        Upper body assist Assist Level: Touching or steadying assistance(Pt > 75%)      Lower Body Dressing/Undressing Lower body dressing   What is the patient wearing?: Pants, Non-skid slipper socks   Underwear - Performed by helper: Thread/unthread right underwear leg, Thread/unthread left underwear leg, Pull underwear up/down Pants- Performed by patient: Pull pants up/down Pants- Performed by helper: Thread/unthread right pants leg, Thread/unthread left pants leg, Pull pants up/down   Non-skid slipper socks- Performed by helper: Don/doff right sock, Don/doff left sock                  Lower body assist Assist for lower body dressing: Touching or steadying assistance (Pt > 75%)      Toileting Toileting Toileting activity did not occur: No continent bowel/bladder event   Toileting steps completed by helper: Adjust clothing prior to toileting, Performs perineal hygiene,  Adjust clothing after toileting Toileting Assistive Devices: Grab bar or rail  Toileting assist Assist level: Touching or steadying assistance (Pt.75%)   Transfers Chair/bed transfer   Chair/bed transfer method: Stand pivot Chair/bed transfer assist level: 2 helpers Chair/bed transfer assistive device: Mechanical lift Mechanical lift: Stedy    Locomotion Ambulation Ambulation activity did not occur: Safety/medical concerns   Max distance: 45ft Assist level: Moderate assist (Pt 50 - 74%)   Wheelchair   Type: Manual Max wheelchair distance: 75 Assist Level: Touching or steadying assistance (Pt > 75%)  Cognition Comprehension Comprehension assist level: Understands basic less than 25% of the time/ requires cueing >75% of the time  Expression Expression assist level: Expresses basis less than 25% of the time/requires cueing >75% of the time.  Social Interaction Social Interaction assist level: Interacts appropriately less than 25% of the time. May be withdrawn or combative.  Problem Solving Problem solving assist level: Solves basic 25 - 49% of the time - needs direction more than half the time to initiate, plan or complete simple activities, Solves basic less than 25% of the time - needs direction nearly all the time or does not effectively solve problems and may need a restraint for safety  Memory Memory assist level: Recognizes or recalls 25 - 49% of the time/requires cueing 50 - 75% of the time   Medical Problem List and Plan: 1.  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   -Continue CIR PT, OT, speech  -likely SNF placement   2.  DVT Prophylaxis/Anticoagulation: Subcutaneous Lovenox.   3. Pain Management: Tylenol as needed 4. Mood: Provide emotional support 5. Neuropsych: This patient Is not capable of making decisions on his own behalf. 6. Skin/Wound Care: Routine skin checks 7. Fluids/Electrolytes/Nutrition:   -potassium normalized -continue to encourage po.on D1/Nectar diet still   - 8. Diabetes mellitus with peripheral neuropathy. Hemoglobin A1c 7.5.    -fair control but intake very inconsistent  -decreased lantus insulin  to 10 units  qam last week---continue at current  dose    -continue metformin at current dose    9. CKD stage III. Stable but at risk for worsening with intake issues10. CAD with pacemaker. Continue aspirin. No chest pain or shortness of breath 11. Chronic systolic congestive heart failure. Monitor for any signs of fluid overload   -daily weights===holding at 74.6kg 74.8 last 2 d  12. Hypertension. No current antihypertensive medications.   - Vitals:   10/29/16 1520 10/30/16 0349  BP: 125/66 128/70  Pulse: 79 80  Resp: 19 18  Temp:  97.8 F (36.6 C)    -observe only for now 13. Tick exposure.RMSF negative. Completed course of doxycycline   LOS (Days) 12 A FACE TO FACE EVALUATION WAS PERFORMED  Charlett Blake, MD 10/30/2016 10:25 AM

## 2016-10-31 ENCOUNTER — Inpatient Hospital Stay (HOSPITAL_COMMUNITY): Payer: Medicare Other

## 2016-10-31 LAB — GLUCOSE, CAPILLARY
Glucose-Capillary: 125 mg/dL — ABNORMAL HIGH (ref 65–99)
Glucose-Capillary: 122 mg/dL — ABNORMAL HIGH (ref 65–99)
Glucose-Capillary: 70 mg/dL (ref 65–99)
Glucose-Capillary: 208 mg/dL — ABNORMAL HIGH (ref 65–99)

## 2016-10-31 MED ORDER — BISACODYL 10 MG RE SUPP
10.0000 mg | Freq: Every day | RECTAL | Status: DC | PRN
  Administered 2016-10-31: 10 mg via RECTAL
  Filled 2016-10-31: qty 1

## 2016-10-31 NOTE — Progress Notes (Signed)
[place] [facility] MEDICINE & REHABILITATION     PROGRESS NOTE    Subjective/Complaints: Low evenings, CABG, patient has been on metformin twice a day. By mouth intake, varying from 10-75%  ROS: Limited due to cognitive/behavioral    Objective: Vital Signs: Blood pressure 130/73, pulse 80, temperature 97.3 F (36.3 C), temperature source Oral, resp. rate 18, height 5\' 10"  (1.778 m), weight 73.1 kg (161 lb 3.3 oz), SpO2 99 %. No results found. No results for input(s): WBC, HGB, HCT, PLT in the last 72 hours. No results for input(s): NA, K, CL, GLUCOSE, BUN, CREATININE, CALCIUM in the last 72 hours.  Invalid input(s): CO CBG (last 3)   Recent Labs  10/30/16 1752 10/30/16 2035 10/31/16 0637  GLUCAP 67 154* 125*    Wt Readings from Last 3 Encounters:  10/31/16 73.1 kg (161 lb 3.3 oz)  10/16/16 75 kg (165 lb 5.5 oz)  09/03/16 79.4 kg (175 lb)    Physical Exam:  Constitutional: no distress HENT:  Head: Normocephalic and atraumatic.  Eyes: EOMI  Neck: Normal range of motion. Neck supple. No thyromegaly present.  Cardiovascular: RRR without murmur. No JVD    Respiratory: CTA Bilaterally without wheezes or rales. Normal effort   GI: Soft. Bowel sounds are normal. He exhibits no distension.  Musculoskeletal: He exhibits no edema or tenderness.  Neurological:  Awake and oriented to self only.  Motor: 4-4+/5 through all 4's.  DTRs 3+ RUE/RLE.   Skin: Skin is warm and dry.  Psychiatric: remains pleasantly confused   Assessment/Plan: 1. Functional and cognitive deficits secondary to encephalopathy and recent right PICA infarct which require 3+ hours per day of interdisciplinary therapy in a comprehensive inpatient rehab setting. Physiatrist is providing close team supervision and 24 hour management of active medical problems listed below. Physiatrist and rehab team continue to assess barriers to discharge/monitor patient progress toward functional and medical  goals.  Function:  Bathing Bathing position Bathing activity did not occur: N/A (night bath) Position: Sitting EOB  Bathing parts Body parts bathed by patient: Right arm, Left arm, Chest, Abdomen, Front perineal area Body parts bathed by helper: Buttocks, Right upper leg, Left upper leg, Right lower leg, Left lower leg  Bathing assist Assist Level: Touching or steadying assistance(Pt > 75%)      Upper Body Dressing/Undressing Upper body dressing   What is the patient wearing?: Pull over shirt/dress     Pull over shirt/dress - Perfomed by patient: Thread/unthread right sleeve Pull over shirt/dress - Perfomed by helper: Thread/unthread left sleeve, Put head through opening, Pull shirt over trunk        Upper body assist Assist Level: Touching or steadying assistance(Pt > 75%)      Lower Body Dressing/Undressing Lower body dressing   What is the patient wearing?: Pants, Non-skid slipper socks   Underwear - Performed by helper: Thread/unthread right underwear leg, Thread/unthread left underwear leg, Pull underwear up/down Pants- Performed by patient: Pull pants up/down Pants- Performed by helper: Thread/unthread right pants leg, Thread/unthread left pants leg, Pull pants up/down   Non-skid slipper socks- Performed by helper: Don/doff right sock, Don/doff left sock                  Lower body assist Assist for lower body dressing: Touching or steadying assistance (Pt > 75%)      Toileting Toileting Toileting activity did not occur: No continent bowel/bladder event   Toileting steps completed by helper: Adjust clothing prior to toileting, Performs perineal hygiene,  Adjust clothing after toileting Toileting Assistive Devices: Grab bar or rail  Toileting assist Assist level: Touching or steadying assistance (Pt.75%)   Transfers Chair/bed transfer   Chair/bed transfer method: Stand pivot Chair/bed transfer assist level: 2 helpers Chair/bed transfer assistive device:  Mechanical lift Mechanical lift: Stedy   Locomotion Ambulation Ambulation activity did not occur: Safety/medical concerns   Max distance: 25ft Assist level: Moderate assist (Pt 50 - 74%)   Wheelchair   Type: Manual Max wheelchair distance: 75 Assist Level: Touching or steadying assistance (Pt > 75%)  Cognition Comprehension Comprehension assist level: Understands basic less than 25% of the time/ requires cueing >75% of the time  Expression Expression assist level: Expresses basis less than 25% of the time/requires cueing >75% of the time.  Social Interaction Social Interaction assist level: Interacts appropriately less than 25% of the time. May be withdrawn or combative.  Problem Solving Problem solving assist level: Solves basic 25 - 49% of the time - needs direction more than half the time to initiate, plan or complete simple activities, Solves basic less than 25% of the time - needs direction nearly all the time or does not effectively solve problems and may need a restraint for safety  Memory Memory assist level: Recognizes or recalls 25 - 49% of the time/requires cueing 50 - 75% of the time   Medical Problem List and Plan: 1.  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   -Continue CIR PT, OT, speech  -likely SNF placement   2.  DVT Prophylaxis/Anticoagulation: Subcutaneous Lovenox.   3. Pain Management: Tylenol as needed 4. Mood: Provide emotional support 5. Neuropsych: This patient Is not capable of making decisions on his own behalf. 6. Skin/Wound Care: Routine skin checks 7. Fluids/Electrolytes/Nutrition:   -potassium normalized -continue to encourage po.on D1/Nectar diet still   - 8. Diabetes mellitus with peripheral neuropathy. Hemoglobin A1c 7.5.    -fair control but intake very inconsistent  -decreased lantus insulin  to 10  units  qam last week---continue at current dose    -discontinue metformin done on 6/23    9. CKD stage III. Stable but at risk for worsening with intake issues10. CAD with pacemaker. Continue aspirin. No chest pain or shortness of breath 11. Chronic systolic congestive heart failure. Monitor for any signs of fluid overload   -daily weights===last 3 d74.6kg 74.8 , 73.1 12. Hypertension. No current antihypertensive medications.   - Vitals:   10/30/16 1510 10/31/16 0411  BP: 128/75 130/73  Pulse: 82 80  Resp: 20 18  Temp: 97.6 F (36.4 C) 97.3 F (36.3 C)    -observe only for now 13. Tick exposure.RMSF negative. Completed course of doxycycline   LOS (Days) 13 A FACE TO FACE EVALUATION WAS PERFORMED  Charlett Blake, MD 10/31/2016 10:45 AM

## 2016-10-31 NOTE — Progress Notes (Signed)
Patient not swallowing solid foods today. Holding in mouth and requiring suctioning to remove. Tolerating liquid intake. Given Mighty milkshake after dinner.   Patient's only bowel movement since 6/20 was small on 6/23 following sorbitol. Suppository given, and coordinated with OT to get patient up to toilet. OT reported unable to get patient to toilet because of heavy lean, can't tolerate position. No bowel movement. Will report to oncoming RN.

## 2016-10-31 NOTE — Progress Notes (Signed)
Occupational Therapy Session Note  Patient Details  Name: [name] MRN: [id number] Date of Birth: 03/02/1931  Today's Date: 10/31/2016 OT Individual Time: 1400-1445 OT Individual Time Calculation (min): 45 min    Short Term Goals: Week 2:  OT Short Term Goal 1 (Week 2): Pt will complete stand pivot transfer to toilet with ModA OT Short Term Goal 2 (Week 2): Pt will demonstrate sustained attention for completing at least 2/3 seated grooming ADL tasks. OT Short Term Goal 3 (Week 2): Pt will complete LB dressing with ModA.  OT Short Term Goal 4 (Week 2): Pt will demonstrate dynamic sitting balance with MinA during functional task completion.   Skilled Therapeutic Interventions/Progress Updates:    1:1. Pt overall seemed confused this session. Was hyperverbal (barely intelligible) and initially declining tx. Pt no c/o pain however making faces and touching R hip. RN aware of increased confusion and pain. Pt agreeable to sit in w/c to eat lunch. Pt requires significantly increased time to come to EOB with A for initiation of movement and overall MOD A to sit EOB with MAX A for scooting. Pt with posterior lean while sitting EOB. OT attempts to use stedy and squat pivot transfer to move into chair, however pt unable to complete anterior weight shift to complete transfer safety with one person. Pt with increased time cued to reach forward and touch target on table to facilitate weight shifting with BUE however pt only able to reach with LUE. Pt shown food and declines opportunity to eat. Pt EOB>supine with MAX A. Exited session with pt semi reclined in high low bed with all 4 rails up and call light in reach.  Therapy Documentation Precautions:  Precautions Precautions: Fall, ICD/Pacemaker Restrictions Weight Bearing Restrictions: No  See Function Navigator for Blevins Functional Status.   Therapy/Group: Individual Therapy  Tonny Branch 10/31/2016, 3:39 PM

## 2016-11-01 ENCOUNTER — Inpatient Hospital Stay (HOSPITAL_COMMUNITY): Payer: Medicare Other | Admitting: Physical Therapy

## 2016-11-01 ENCOUNTER — Inpatient Hospital Stay (HOSPITAL_COMMUNITY): Payer: Medicare Other

## 2016-11-01 ENCOUNTER — Inpatient Hospital Stay (HOSPITAL_COMMUNITY): Payer: Medicare Other | Admitting: Speech Pathology

## 2016-11-01 DIAGNOSIS — F0390 Unspecified dementia without behavioral disturbance: Secondary | ICD-10-CM

## 2016-11-01 DIAGNOSIS — E870 Hyperosmolality and hypernatremia: Secondary | ICD-10-CM

## 2016-11-01 LAB — URINALYSIS, ROUTINE W REFLEX MICROSCOPIC
BILIRUBIN URINE: NEGATIVE
Glucose, UA: NEGATIVE mg/dL
Hgb urine dipstick: NEGATIVE
Ketones, ur: NEGATIVE mg/dL
Leukocytes, UA: NEGATIVE
Nitrite: NEGATIVE
Protein, ur: 100 mg/dL — AB
SPECIFIC GRAVITY, URINE: 1.019 (ref 1.005–1.030)
SQUAMOUS EPITHELIAL / LPF: NONE SEEN
pH: 5 (ref 5.0–8.0)

## 2016-11-01 LAB — GLUCOSE, CAPILLARY
GLUCOSE-CAPILLARY: 94 mg/dL (ref 65–99)
GLUCOSE-CAPILLARY: 125 mg/dL — AB (ref 65–99)
GLUCOSE-CAPILLARY: 163 mg/dL — AB (ref 65–99)
Glucose-Capillary: 163 mg/dL — ABNORMAL HIGH (ref 65–99)

## 2016-11-01 LAB — BASIC METABOLIC PANEL
ANION GAP: 7 (ref 5–15)
BUN: 26 mg/dL — AB (ref 6–20)
CALCIUM: 9.1 mg/dL (ref 8.9–10.3)
CO2: 22 mmol/L (ref 22–32)
Chloride: 122 mmol/L — ABNORMAL HIGH (ref 101–111)
Creatinine, Ser: 1.34 mg/dL — ABNORMAL HIGH (ref 0.61–1.24)
GFR calc Af Amer: 55 mL/min — ABNORMAL LOW (ref >60)
GFR, EST NON AFRICAN AMERICAN: 47 mL/min — AB (ref >60)
GLUCOSE: 154 mg/dL — AB (ref 65–99)
Potassium: 4.5 mmol/L (ref 3.5–5.1)
SODIUM: 151 mmol/L — AB (ref 135–145)

## 2016-11-01 LAB — CBC
HEMATOCRIT: 42 % (ref 39.0–52.0)
Hemoglobin: 13.9 g/dL (ref 13.0–17.0)
MCH: 31.9 pg (ref 26.0–34.0)
MCHC: 33.1 g/dL (ref 30.0–36.0)
MCV: 96.3 fL (ref 78.0–100.0)
Platelets: 215 10*3/uL (ref 150–400)
RBC: 4.36 MIL/uL (ref 4.22–5.81)
RDW: 16.1 % — AB (ref 11.5–15.5)
WBC: 9.8 10*3/uL (ref 4.0–10.5)

## 2016-11-01 LAB — COMPREHENSIVE METABOLIC PANEL
ALBUMIN: 3 g/dL — AB (ref 3.5–5.0)
ALT: 63 U/L (ref 17–63)
AST: 26 U/L (ref 15–41)
Alkaline Phosphatase: 102 U/L (ref 38–126)
Anion gap: 12 (ref 5–15)
BILIRUBIN TOTAL: 1.7 mg/dL — AB (ref 0.3–1.2)
BUN: 26 mg/dL — AB (ref 6–20)
CHLORIDE: 121 mmol/L — AB (ref 101–111)
CO2: 19 mmol/L — ABNORMAL LOW (ref 22–32)
Calcium: 9.1 mg/dL (ref 8.9–10.3)
Creatinine, Ser: 1.41 mg/dL — ABNORMAL HIGH (ref 0.61–1.24)
GFR calc Af Amer: 52 mL/min — ABNORMAL LOW (ref >60)
GFR calc non Af Amer: 44 mL/min — ABNORMAL LOW (ref >60)
GLUCOSE: 148 mg/dL — AB (ref 65–99)
POTASSIUM: 4.3 mmol/L (ref 3.5–5.1)
Sodium: 152 mmol/L — ABNORMAL HIGH (ref 135–145)
Total Protein: 6.2 g/dL — ABNORMAL LOW (ref 6.5–8.1)

## 2016-11-01 LAB — CREATININE, SERUM
Creatinine, Ser: 1.41 mg/dL — ABNORMAL HIGH (ref 0.61–1.24)
GFR, EST AFRICAN AMERICAN: 52 mL/min — AB (ref >60)
GFR, EST NON AFRICAN AMERICAN: 44 mL/min — AB (ref >60)

## 2016-11-01 LAB — OSMOLALITY: Osmolality: 322 mOsm/kg (ref 275–295)

## 2016-11-01 LAB — CREATININE, URINE, RANDOM: Creatinine, Urine: 115.75 mg/dL

## 2016-11-01 LAB — SODIUM, URINE, RANDOM: Sodium, Ur: 79 mmol/L

## 2016-11-01 MED ORDER — SODIUM CHLORIDE 0.9 % IV BOLUS (SEPSIS): 500.0000 mL | Freq: Once | INTRAVENOUS | Status: DC

## 2016-11-01 MED ORDER — SODIUM CHLORIDE 0.45 % IV SOLN
INTRAVENOUS | Status: DC
  Administered 2016-11-01 – 2016-11-02 (×2): via INTRAVENOUS

## 2016-11-01 MED ORDER — SODIUM CHLORIDE 0.9 % IV BOLUS (SEPSIS)
500.0000 mL | Freq: Once | INTRAVENOUS | Status: AC
  Administered 2016-11-01: 500 mL via INTRAVENOUS

## 2016-11-01 MED ORDER — SODIUM CHLORIDE 0.45 % IV SOLN: INTRAVENOUS | Status: DC

## 2016-11-01 NOTE — Progress Notes (Signed)
Patient lethargic in morning. Reported to MD, PA. Orders given for labs, fluids. 1200 patient more alert. Provided patient with music. Attempted mighty milkshake but patient only able to tolerate 4-5 sips, required oral suction after attempt. Participated in occupational  therapy. Sat at nurse's station for rest of shift listening to music, more responsive.

## 2016-11-01 NOTE — Progress Notes (Addendum)
Speech Language Pathology Daily Session Note  Patient Details  Name: [name] MRN: [id number] Date of Birth: Feb 21, 1931  Today's Date: 11/01/2016 SLP Individual Time:  -     Missed 30 minutes d/t fatigue (inability to arouse)  Short Term Goals: Week 2: SLP Short Term Goal 1 (Week 2): Pt will consume therapeutic trials of thin liquids with max assist multimodal cues for use of swallowing precautions.   SLP Short Term Goal 2 (Week 2): Pt will maintain alertness for 1 minute intervals with max assist multimodal cues.   SLP Short Term Goal 3 (Week 2): Pt will sustain his attention to task for 30 second intervals with max assist multimodal cues for redirection.   SLP Short Term Goal 4 (Week 2): Pt will utilize external aids to orient to place, date, and situation with max assist multimodal cues.   SLP Short Term Goal 4 - Progress (Week 2): Discontinued (comment) (d/t decline in cognitive ability) SLP Short Term Goal 5 (Week 2): Pt will consume dys 1 textures and nectar liquids with Max assist verbal cues for use of swallowing precautions and minimal overt s/s of aspiration   Skilled Therapeutic Interventions: Skilled treatment session focused on cognition goals aimed at increasing arousal for PO intake. Pt continues with cognitive decline. Pt with startle reflex when spoken too, inability to focus eyes when he opens them, increased deep breathing, open mouth posture, no verbalizations and the appearance of apnec breathing. SLP requested nursing and MD to assess pt. Education provided to hold all POs until pt able to respond appropriately to stimulation. Pt may benefit from NPO status and prognosis to maintain nurtitional support with PO intake is poor.      Function:    Cognition Comprehension Comprehension assist level: Understands basic less than 25% of the time/ requires cueing >75% of the time  Expression   Expression assist level: Expresses basis less than 25% of the time/requires  cueing >75% of the time.  Social Interaction Social Interaction assist level: Interacts appropriately less than 25% of the time. May be withdrawn or combative.  Problem Solving Problem solving assist level: Solves basic less than 25% of the time - needs direction nearly all the time or does not effectively solve problems and may need a restraint for safety  Memory      Pain    Therapy/Group: Individual Therapy   Nitza Schmid B. Rutherford Nail, M.S., CCC-SLP Speech-Language Pathologist   Ailie Gage 11/01/2016, 4:51 PM

## 2016-11-01 NOTE — Progress Notes (Signed)
Physical Therapy Session Note  Patient Details  Name: [name] MRN: [id number] Date of Birth: 01-25-1931  Today's Date: 11/01/2016 PT Individual Time: 1000-1105 PT Individual Time Calculation (min): 65 min   Short Term Goals: Week 2:  PT Short Term Goal 1 (Week 2): Pt will transfer with consistent minA PT Short Term Goal 2 (Week 2): Pt will perform bed mobility with minA PT Short Term Goal 3 (Week 2): Pt will perform gait x50' with consistent minA PT Short Term Goal 4 (Week 2): Pt will attend to functional mobility task >2 min with minimal cueing   Skilled Therapeutic Interventions/Progress Updates:   Pt received alert and supine in bed with pt's cousin present. Pt total A to don pants; unable to follow commands for bridging to don pants in supine; max A rolling R/L in order to donn pants. Max A supine>sit on EOB with mod A to maintain sitting balance on EOB due to posterior lean. Pt max A+2 for sit>stand using stedy; pt performed sit>stand from seat on steady x 5 with min A/min guard. Pt total A using stedy to transfer from bed>w/c; multimodal cuing and manual facilitation at hips for stand>sit in w/c. Once seated in w/c, pt demonstrated decreased arousal and attention. Total A to rehab gym; unable to maintain arousal despite multimodal cuing. Pt demonstrated abnormal breathing pattern and requiring sternal rub for arousal; RN and PA alerted. Total A+3 squat pivot transfer from w/c>bed to safely return pt to bed due to decreased state of arousal. Pt left supine in bed with call bell in reach, 3 rails up, and bed alarm activated.   Therapy Documentation Precautions:  Precautions Precautions: Fall, ICD/Pacemaker Restrictions Weight Bearing Restrictions: No   See Function Navigator for Current Functional Status.   Therapy/Group: Individual Therapy  Alysia Penna 11/01/2016, 12:51 PM

## 2016-11-01 NOTE — Progress Notes (Signed)
Occupational Therapy Session Note  Patient Details  Name: [name] MRN: [id number] Date of Birth: 02-08-1931  Today's Date: 11/01/2016 OT Individual Time: 1330-1430 OT Individual Time Calculation (min): 60 min    Short Term Goals: Week 2:  OT Short Term Goal 1 (Week 2): Pt will complete stand pivot transfer to toilet with ModA OT Short Term Goal 2 (Week 2): Pt will demonstrate sustained attention for completing at least 2/3 seated grooming ADL tasks. OT Short Term Goal 3 (Week 2): Pt will complete LB dressing with ModA.  OT Short Term Goal 4 (Week 2): Pt will demonstrate dynamic sitting balance with MinA during functional task completion.   Skilled Therapeutic Interventions/Progress Updates:    Pt resting in bed upon arrival with daughter and friend present.  Pt required max encouragement, multimodal cues, and max A to sit EOB in preparation for squat pivot transfer to w/c.  Pt required more than a reasonable amount of time with max multimodal cues to initiate transfer.  Pt required max multimodal cues to keep eyes open throughout session.  Pt required max A for squat pivot transfer to w/c.  Pt followed one step commands approx 50% of time and physically resisted tasks when pt unable to comprehend/follow command.  Pt transitioned to gym and engaged in sit<>stnad with parallel bar to pull up on.  Pt completed sit<>stand X 4 with steady A.  Pt able to stand at bar with steady A.  Educated daughter and friend regarding cognitive deficits and impact on ability to physically participate in self caretasks.  Daughter and friend verbalized understanding.  Pt returned to room and remained in w/c with QRB in place.  Daughter instructed to notify staff when leaving room.    Therapy Documentation Precautions:  Precautions Precautions: Fall, ICD/Pacemaker Restrictions Weight Bearing Restrictions: No  Pain:  Pt with no s/s of pain  See Function Navigator for Current Functional  Status.   Therapy/Group: Individual Therapy  Leroy Libman 11/01/2016, 2:37 PM

## 2016-11-01 NOTE — Consult Note (Signed)
Medical Consultation   [name]  ZWC:[id number]  DOB: [birth year]  DOA: 10/18/2016  PCP: Elayne Snare, MD    Requesting physician: Rehab Medicine Team  Reason for consultation: Hypernatremia     History of Present Illness: [name] is an 83 y.o. male  With a history of  HTN, HLD, Chronic systolic Heart Failure EF 30 %, CKD 3, DM, CAD s/p PMP on ASA, Dementia,admitted on 10/14/16 with AMS, found to have acute R cerebellar infarct with decreased functional mobility. He was eventually admitted to the Rehab Unit  He was noted to have elevated sodium levels  Since 6/20: 147->146->today 152. His oral intake has decreased significantly, especially with liquids. Family at bedside reports approximately no more than 500 cc/day after forcing him to consume. He has failure to thrive. He remains confused Level V Caveat. History is unreliable due to patient's confusion. History is obtained by family and nursing. No apparent spasm or muscle twitching, no apparent thirst. No apparent seizures were witnessed. No vomiting or diarrhea. No Fever . No respiratory, cardiac issues are present. [facility] were requested to help in the management of his hypernatremia.    Review of Systems:  As per HPI , unable to provide due to confusion    Past Medical History: Past Medical History:  Diagnosis Date  . Cancer ([place])    hx of prostate ca  . Cataracts, bilateral    hx of  . CHF (congestive heart failure) ([place])   . Diabetes mellitus   . ED (erectile dysfunction)   . Hyperlipidemia   . Hypertension   . Neuromuscular disorder (HCC)    numbness in hand/cervical issues  . Pneumonia    hx of  . Syncope    hx of  . Urination frequency     Past Surgical History: Past Surgical History:  Procedure Laterality Date  . CARDIAC CATHETERIZATION N/A 11/13/2015   Procedure: Temporary Pacemaker;  Surgeon: Lorretta Harp, MD;  Location: [place] [facility];  Service: Cardiovascular;   Laterality: N/A;  . EP IMPLANTABLE DEVICE N/A 11/14/2015   Procedure: BiV Pacemaker Insertion CRT-P;  Surgeon: Deboraha Sprang, MD;  Location: [place] [facility];  Service: Cardiovascular;  Laterality: N/A;  . EYE SURGERY     cataract surgery bilateral  . HERNIA REPAIR    . POSTERIOR CERVICAL FUSION/FORAMINOTOMY  11/18/2011   Procedure: POSTERIOR CERVICAL FUSION/FORAMINOTOMY LEVEL 1;  Surgeon: Eustace Moore, MD;  Location: [place] [facility];  Service: Neurosurgery;  Laterality: Bilateral;  . PROSTATE SURGERY     s/p ca  . SMALL INTESTINE SURGERY     hx of  . TONSILLECTOMY       Allergies:  No Known Allergies   Social History: Social History   Social History  . Marital status: Widowed    Spouse name: N/A  . Number of children: N/A  . Years of education: N/A   Occupational History  . retired     Actuary   Social History Main Topics  . Smoking status: Never Smoker  . Smokeless tobacco: Never Used  . Alcohol use No     Comment: daughter states that used to be "heavy weekend drinker" but quit 10 + years ago  . Drug use: No  . Sexual activity: Not Currently   Other Topics Concern  . Not on file   Social History Narrative  . No narrative  on file       Family History: Family History  Problem Relation Age of Onset  . Diabetes Mother   . Hypertension Father     Family history reviewed and not pertinent    Physical Exam: Vitals:   10/31/16 0411 10/31/16 1157 11/01/16 0458 11/01/16 0742  BP: 130/73 123/62 128/68 116/90  Pulse: 80 74 77 84  Resp: 18 18 18  (!) 22  Temp: 97.3 F (36.3 C) 97.7 F (36.5 C) 97.7 F (36.5 C) 98.2 F (36.8 C)  TempSrc: Oral Axillary Oral Oral  SpO2: 99% 100% 100% 100%  Weight: 73.1 kg (161 lb 3.3 oz)  72.6 kg (160 lb)   Height:        Constitutional: Appears calm, awake, confused, not in any acute distress. Eyes: PERLA, EOMI, irises appear normal, anicteric sclera,  ENMT: external ears and nose appear normal  Lips appears normal,  Tongue without exudate Neck: neck appears normal, no masses, normal ROM, no thyromegaly, no JVD  CVS: S1-S2 clear, no murmur rubs or gallops, no LE edema, normal pedal pulses  Respiratory: clear to auscultation bilaterally, no wheezing, rales or rhonchi. Respiratory effort normal. No accessory muscle use.  Abdomen: soft nontender, nondistended, normal bowel sounds, no hepatosplenomegaly, no hernias  Musculoskeletal: no cyanosis, clubbing or edema noted bilaterally. No apparent tenderness or contractures  Neuro: Cannot assess strength, not cooperative.  Not able to provide self information, place or date.  Skin: no rashes or lesions or ulcers, no induration or nodules   Data reviewed:  I have personally reviewed following labs and imaging studies Labs:  CBC:  Recent Labs Lab 10/26/16 1057 11/01/16 0914  WBC 7.5 9.8  HGB 15.3 13.9  HCT 45.2 42.0  MCV 95.8 96.3  PLT 196 458    Basic Metabolic Panel:  Recent Labs Lab 10/26/16 1057 10/27/16 0612 10/28/16 0913 11/01/16 0914  NA 143 147* 146* 152*  K 5.4* 4.9 4.4 4.3  CL 111 113* 115* 121*  CO2 19* 23 17* 19*  GLUCOSE 105* 102* 117* 148*  BUN 26* 23* 27* 26*  CREATININE 1.25* 1.29* 1.31* 1.41*  CALCIUM 9.9 9.6 9.4 9.1   GFR Estimated Creatinine Clearance: 40.8 mL/min (A) (by C-G formula based on SCr of 1.41 mg/dL (H)). Liver Function Tests:  Recent Labs Lab 11/01/16 0914  AST 26  ALT 63  ALKPHOS 102  BILITOT 1.7*  PROT 6.2*  ALBUMIN 3.0*   No results for input(s): LIPASE, AMYLASE in the last 168 hours. No results for input(s): AMMONIA in the last 168 hours. Coagulation profile No results for input(s): INR, PROTIME in the last 168 hours.  Cardiac Enzymes: No results for input(s): CKTOTAL, CKMB, CKMBINDEX, TROPONINI in the last 168 hours. BNP: Invalid input(s): POCBNP CBG:  Recent Labs Lab 10/31/16 1111 10/31/16 1613 10/31/16 2039 11/01/16 0609 11/01/16 1133  GLUCAP 122* 70 208* 94  125*   D-Dimer No results for input(s): DDIMER in the last 72 hours. Hgb A1c No results for input(s): HGBA1C in the last 72 hours. Lipid Profile No results for input(s): CHOL, HDL, LDLCALC, TRIG, CHOLHDL, LDLDIRECT in the last 72 hours. Thyroid function studies No results for input(s): TSH, T4TOTAL, T3FREE, THYROIDAB in the last 72 hours.  Invalid input(s): FREET3 Anemia work up No results for input(s): VITAMINB12, FOLATE, FERRITIN, TIBC, IRON, RETICCTPCT in the last 72 hours. Urinalysis    Component Value Date/Time   COLORURINE AMBER (A) 11/01/2016 0954   APPEARANCEUR CLEAR 11/01/2016 0954   LABSPEC 1.019 11/01/2016  [place] 5.0 11/01/2016 0954   GLUCOSEU NEGATIVE 11/01/2016 0954   GLUCOSEU NEGATIVE 10/15/2014 0828   HGBUR NEGATIVE 11/01/2016 0954   BILIRUBINUR NEGATIVE 11/01/2016 0954   BILIRUBINUR negative 10/16/2015 [place] 11/01/2016 0954   PROTEINUR 100 (A) 11/01/2016 0954   UROBILINOGEN 0.2 10/16/2015 0857   UROBILINOGEN 1.0 10/15/2014 0828   NITRITE NEGATIVE 11/01/2016 0954   LEUKOCYTESUR NEGATIVE 11/01/2016 0954     Sepsis Labs Invalid input(s): PROCALCITONIN,  WBC,  LACTICIDVEN Microbiology Recent Results (from the past 240 hour(s))  Urine Culture     Status: None   Collection Time: 10/26/16  3:33 PM  Result Value Ref Range Status   Specimen Description URINE, CLEAN CATCH  Final   Special Requests NONE  Final   Culture NO GROWTH  Final   Report Status 10/27/2016 FINAL  Final       Inpatient Medications:   Scheduled Meds: . aspirin EC  81 mg Oral Daily  . enoxaparin (LOVENOX) injection  40 mg Subcutaneous Q24H  . insulin aspart  0-9 Units Subcutaneous TID WC  . insulin glargine  10 Units Subcutaneous Daily  . mouth rinse  15 mL Mouth Rinse BID   Continuous Infusions:   Radiological Exams on Admission: No results found.  Impression/Recommendations Principal Problem:   Acute encephalopathy Active Problems:   Chronic  combined systolic and diastolic heart failure (HCC)   Essential hypertension   DM type 2 with diabetic peripheral neuropathy (HCC)   Dementia without behavioral disturbance  Hypernatremia, in the setting of dehydration due to very poor oral intake, failure to thrive, with subsequent hypovolemia  Since 6/20: 147->146->today 152.  FeNA labs, including serum and urine sodium, serum and urine creatinine, serum osmolality After labs drawn, may proceed with IV NS 500 cc bolus and 75 cc half NS  Recheck BMET at   22:00 and in am  Will continue to follow with you  Chronic  Combined  Systolic CHF, well compensated  Last 2 D echo on 0/3/21  Systolic function was moderately to  severely reduced. The estimated ejection fraction  30% to 35%.  Osats   VSS  Normal  Weight  160 lbs     monitor I/Os and daily weights in view of need for IVF prn 02  History of  acute R cerebellar infarct with decreased functional mobility. Plans as per Rehab team   Type II Diabetes Current blood sugar level is 148 Lab Results  Component Value Date   HGBA1C 7.5 (H) 10/15/2016  Lantus at 10 U Metformin on hold due to low blood shgar this morning Continue to follow as per primary team   Chronic kidney disease stage  3   baseline creatinine 1.29, current Cr 1.41 in the setting of decreased fluid intake    Lab Results  Component Value Date   CREATININE 1.41 (H) 11/01/2016   CREATININE 1.31 (H) 10/28/2016   CREATININE 1.29 (H) 10/27/2016  IVF as above, expected to improve  Repeat CMET in am   Hypertension BP 116/90    Pulse 84    Continue home anti-hypertensive medications   Other medical issues as per Admitting team    Thank you for this consultation.  Our [facility] hospitalist team will follow the patient with you.    [facility] E PA-C [facility] 11/01/2016, 12:08 PM

## 2016-11-02 ENCOUNTER — Encounter (HOSPITAL_COMMUNITY): Payer: Medicare Other

## 2016-11-02 ENCOUNTER — Encounter (HOSPITAL_COMMUNITY): Payer: Self-pay

## 2016-11-02 ENCOUNTER — Inpatient Hospital Stay (HOSPITAL_COMMUNITY): Payer: Medicare Other | Admitting: Speech Pathology

## 2016-11-02 ENCOUNTER — Inpatient Hospital Stay (HOSPITAL_COMMUNITY): Payer: Medicare Other | Admitting: Physical Therapy

## 2016-11-02 ENCOUNTER — Ambulatory Visit (HOSPITAL_COMMUNITY): Payer: Medicare Other | Admitting: Physical Therapy

## 2016-11-02 DIAGNOSIS — F0280 Dementia in other diseases classified elsewhere without behavioral disturbance: Secondary | ICD-10-CM

## 2016-11-02 DIAGNOSIS — Z7189 Other specified counseling: Secondary | ICD-10-CM

## 2016-11-02 DIAGNOSIS — I63441 Cerebral infarction due to embolism of right cerebellar artery: Secondary | ICD-10-CM

## 2016-11-02 DIAGNOSIS — Z515 Encounter for palliative care: Secondary | ICD-10-CM

## 2016-11-02 DIAGNOSIS — E870 Hyperosmolality and hypernatremia: Secondary | ICD-10-CM

## 2016-11-02 LAB — SODIUM: SODIUM: 152 mmol/L — AB (ref 135–145)

## 2016-11-02 LAB — GLUCOSE, CAPILLARY
GLUCOSE-CAPILLARY: 204 mg/dL — AB (ref 65–99)
Glucose-Capillary: 135 mg/dL — ABNORMAL HIGH (ref 65–99)
Glucose-Capillary: 154 mg/dL — ABNORMAL HIGH (ref 65–99)
Glucose-Capillary: 110 mg/dL — ABNORMAL HIGH (ref 65–99)

## 2016-11-02 MED ORDER — DEXTROSE 5 % IV SOLN
INTRAVENOUS | Status: DC
  Administered 2016-11-02 (×2): via INTRAVENOUS

## 2016-11-02 MED ORDER — ADULT MULTIVITAMIN W/MINERALS CH
1.0000 | ORAL_TABLET | Freq: Every day | ORAL | Status: DC
  Administered 2016-11-02 – 2016-11-03 (×2): 1 via ORAL
  Filled 2016-11-02 (×2): qty 1

## 2016-11-02 NOTE — Progress Notes (Signed)
Physical Therapy Weekly Progress Note  Patient Details  Name: [name] MRN: [id number] Date of Birth: Jan 01, 1931  Beginning of progress report period: October 26, 2016 End of progress report period: November 02, 2016  Today's Date: 11/02/2016 PT Individual Time: 1030-1130 and 1400-1445 PT Individual Time Calculation (min): 60 min and 45 min (total 105 min)   Patient has met 0 of 4 short term goals.  Pt has demonstrated functional decline over the past week, regressing from min guard/minA overall to max/totalA overall with occasional +2A. Pt functional status fluctuates with varying attention, arousal, fatigue. Plan for extensive family education to determine family's ability to provide heavy burden of care and safe mobility at home.    Patient continues to demonstrate the following deficits muscle weakness, decreased cardiorespiratoy endurance, impaired timing and sequencing, abnormal tone, unbalanced muscle activation, motor apraxia, decreased coordination and decreased motor planning, decreased motor planning, decreased initiation, decreased attention, decreased awareness, decreased problem solving, decreased safety awareness, decreased memory and delayed processing and decreased sitting balance, decreased standing balance, decreased postural control and decreased balance strategies and therefore will continue to benefit from skilled PT intervention to increase functional independence with mobility.  Patient not progressing toward long term goals.  See goal revision..  Plan of care revisions: Downgraded to maxA overall..  PT Short Term Goals Week 2:  PT Short Term Goal 1 (Week 2): Pt will transfer with consistent minA PT Short Term Goal 1 - Progress (Week 2): Not met PT Short Term Goal 2 (Week 2): Pt will perform bed mobility with minA PT Short Term Goal 2 - Progress (Week 2): Not met PT Short Term Goal 3 (Week 2): Pt will perform gait x50' with consistent minA PT Short Term Goal 3 - Progress  (Week 2): Not met PT Short Term Goal 4 (Week 2): Pt will attend to functional mobility task >2 min with minimal cueing  PT Short Term Goal 4 - Progress (Week 2): Not met Week 3:  PT Short Term Goal 1 (Week 3): =LTG downgraded to maxA overall  Skilled Therapeutic Interventions/Progress Updates: Tx 1: Pt received seated in bed, arousable to stimuli and agreeable to treatment. Improved initiation of bed mobility however ultimately requires modA supine>sit with HOB elevated. Posterior lean in sitting, correctable with BUEs on stedy and cues for anterior weight shift however unable to maintain >30 sec before letting go of stedy and leaning back; totalA to regain balance. Sit >stand with minA ultimately however several unsuccessful attempts prior to pt improve initiation to task. Pants donned totalA on EOB and sit >stand in stedy. 5 reps sit >stand in stedy from seat with pt initiating task on his own. Transfer to w/c totalA with stedy. Seated in w/c, pt reports he would like to eat and drink prior to performing oral care. Pt swallows 3 spoonfuls of nectar-thick cranberry juice. One successful bite of yogurt before oral holding and requiring suction to clear mouth. Oral care performed totalA with suction kit. Unable to propel w/c with max multimodal cueing. Sit >stand at parallel bars minA/min guard with improved initiation x2 trials before pt declining additional trials. Sit >stand at sink with BUE support; changed brief and performed hygiene totalA after incontinent bowel movement; second person assisted with maintaining balance while other therapist performed peri care. Remained seated in w/c at nurses station at end of session, staff present and all needs in reach.   Tx 2: pt received seated in w/c with handoff from OT; lethargic throughout session, requires  max multimodal cueing for attention and arousal. Pt's daughter present for hands on education. She reports she was not able to stand him with OT; educated  on use of hoyer lift to transport pt and daughter agreeable to try. Daughter performed majority of steps of placing sling and using lift to return pt to bed; requires max multimodal instructional cueing regarding setup and technique. Once in bed pt arousable enough to report he needs to use restroom. Daughter reporting if he was at home she would get him to [facility]; reminded her that she had difficulty getting him to stand earlier due to decreased arousal and BSC would not be a safe option. Rolling R/L maxA +2 with daughter requiring max cueing for technique to place LE into hooklying to reduce burden of care and physical stress on patient while attempting to roll. Pt's daughter declined to perform peri hygiene and brief change at this time, suspect she was very fatigued from performing hoyer lift transfer. [place] for hygiene and clothing management. Discussed with daughter that pt will need ramp for home entry; daughter stating "well we just need a piece of wood to go up those four steps"; urged family to follow ADA recommendations for slope of ramp, provided with handouts with ramp plans and instructions, as well as provided rental ramp options, and handout on carrying pt in w/c up stairs however daughter reports she won't be able to do that and does not have other family members or friends who could help with that consistently. Pt remained in bed at end of session, all needs in reach, alarm intact and family present.      Therapy Documentation Precautions:  Precautions Precautions: Fall, ICD/Pacemaker Restrictions Weight Bearing Restrictions: No   See Function Navigator for Current Functional Status.  Therapy/Group: Individual Therapy  Luberta Mutter 11/02/2016, 11:23 AM

## 2016-11-02 NOTE — Progress Notes (Signed)
[facility] consult progress note                                                                              Patient Demographics  [name], is a 83 y.o. male, DOB - 05/29/1930, MOQ:[id number]  Admit date - 10/18/2016   Admitting Physician Meredith Staggers, MD  Outpatient Primary MD for the patient is Elayne Snare, MD  Outpatient specialists:   LOS - 15  days   Medical records reviewed and are as summarized below:    No chief complaint on file.      Brief summary   Patient is a 83 year old male with HTN, HLD, Chronic systolic Heart Failure EF 30 %, CKD 3, DM, CAD s/p PMP on ASA, Dementia, admitted on 10/14/16 with AMS, found to have acute Right cerebellar infarct with decreased functional mobility. He was eventually admitted to the Rehab Unit  He was noted to have elevated sodium levels Since 6/20: 147->146-> 152. His oral intake has decreased significantly, especially with liquids. Family at bedside reports approximately no more than 500 cc/day after forcing him to consume. He has failure to thrive. He remains confused. [facility] medicine service was consulted for further management.    Assessment & Plan    Principal Problem: Hypernatremia in the setting of failure to thrive, dehydration, very poor oral intake, hypovolemia - Since 6/20, sodium has been trending up 147-> 146-> 152-> 151-> 152 corresponding to elevated creatinine levels - Patient received IV normal saline bolus and half normal saline - Elevated serum osmolarity 322, urine sodium 79, FeNa 0.6 suggestive of prerenal etiology, dehydration, impaired thirst mechanism - I have changed IV fluids to D5 at 75 mL an hour, obtain BMET in a.m. - Patient with acute encephalopathy, superimposed on dementia, strongly recommend palliative care consult for goals of care discussions  Active Problems:  Acute encephalopathy - Unclear baseline however patient appears to have dementia and recent CVA. UA  negative for UTI on 6/25 - Recommend goals of care discussions with the family    Chronic combined systolic and diastolic heart failure ([place]) - 2-D echo 6/8 showed a moderate to severely reduced EF, 30-35% - Monitor I's and O's and daily weights in view of need for IVF -Currently appears to be hypovolemic, monitor volume status closely  History of acute right cerebellar infarct - Plan per CIR team     Essential hypertension - Currently stable    DM type 2 with diabetic peripheral neuropathy ([place]) Hemoglobin A1c 7.5 on 6/8 - Continue to hold metformin - May need to increase sliding scale insulin if CBGs started increasing on D5 drip - Continue Lantus and sliding scale insulin for now   Chronic kidney disease stage III - Baseline creatinine 1.2, currently 1.3 in the setting of poor oral intake - Currently on gentle hydration    Code Status:  full  DVT Prophylaxis:  Lovenox Family Communication: No family member at the bedside  Disposition Plan: Per inpatient rehabilitation/primary team  Time Spent in minutes  35 minutes  Procedures:    Consultants:     Antimicrobials:      Medications  Scheduled  Meds: . aspirin EC  81 mg Oral Daily  . enoxaparin (LOVENOX) injection  40 mg Subcutaneous Q24H  . insulin aspart  0-9 Units Subcutaneous TID WC  . insulin glargine  10 Units Subcutaneous Daily  . mouth rinse  15 mL Mouth Rinse BID   Continuous Infusions: . dextrose 75 mL/hr at 11/02/16 0859   PRN Meds:.acetaminophen **OR** acetaminophen (TYLENOL) oral liquid 160 mg/5 mL **OR** acetaminophen, bisacodyl, ondansetron **OR** ondansetron (ZOFRAN) IV, sorbitol, traZODone   Antibiotics   Anti-infectives    Start     Dose/Rate Route Frequency Ordered Stop   10/18/16 2000  doxycycline (VIBRA-TABS) tablet 100 mg     100 mg Oral Every 12 hours 10/18/16 1715 10/23/16 0850        Subjective:   [name] was seen and examined today. Confused, unable to provide any  history or review of systems. Per nurse, no acute issues overnight, poor oral intake. .    Objective:   Vitals:   11/01/16 0458 11/01/16 0742 11/01/16 1535 11/02/16 0435  BP: 128/68 116/90 (!) 116/96 115/82  Pulse: 77 84 85 81  Resp: 18 (!) 22 (!) 22 20  Temp: 97.7 F (36.5 C) 98.2 F (36.8 C) 97.2 F (36.2 C) 97.6 F (36.4 C)  TempSrc: Oral Oral Axillary Oral  SpO2: 100% 100% 96% 95%  Weight: 72.6 kg (160 lb)   73.9 kg (163 lb)  Height:        Intake/Output Summary (Last 24 hours) at 11/02/16 1057 Last data filed at 11/02/16 0900  Gross per 24 hour  Intake              300 ml  Output              400 ml  Net             -100 ml     Wt Readings from Last 3 Encounters:  11/02/16 73.9 kg (163 lb)  10/16/16 75 kg (165 lb 5.5 oz)  09/03/16 79.4 kg (175 lb)     Exam  General: Alert and awake, NAD  Eyes: PERRLA, EOMI, Anicteric Sclera,  HEENT:  Atraumatic, normocephalic, normal oropharynx  Cardiovascular: S1 S2 auscultated, no rubs, murmurs or gallops. Regular rate and rhythm.  Respiratory: Decreased breath sound at the bases   Gastrointestinal: Soft, nontender, nondistended, + bowel sounds  Ext: no pedal edema bilaterally  Neuro:Not cooperative   Musculoskeletal: No digital cyanosis, clubbing  Skin: No rashes  Psych: alert and awake, somewhat confused    Data Reviewed:  I have personally reviewed following labs and imaging studies  Micro Results Recent Results (from the past 240 hour(s))  Urine Culture     Status: None   Collection Time: 10/26/16  3:33 PM  Result Value Ref Range Status   Specimen Description URINE, CLEAN CATCH  Final   Special Requests NONE  Final   Culture NO GROWTH  Final   Report Status 10/27/2016 FINAL  Final  Culture, Urine     Status: None (Preliminary result)   Collection Time: 11/01/16  9:53 AM  Result Value Ref Range Status   Specimen Description URINE, CLEAN CATCH  Final   Special Requests NONE  Final   Culture CULTURE  REINCUBATED FOR BETTER GROWTH  Final   Report Status PENDING  Incomplete    Radiology Reports Ct Angio Head W Or Wo Contrast  Result Date: 10/15/2016 CLINICAL DATA:  83 year old male with confusion, altered mental status. Age indeterminate Right PICA  territory infarct on head CT yesterday. EXAM: CT ANGIOGRAPHY HEAD AND NECK TECHNIQUE: Multidetector CT imaging of the head and neck was performed using the standard protocol during bolus administration of intravenous contrast. Multiplanar CT image reconstructions and MIPs were obtained to evaluate the vascular anatomy. Carotid stenosis measurements (when applicable) are obtained utilizing NASCET criteria, using the distal internal carotid diameter as the denominator. CONTRAST:  50 mL Isovue 370 COMPARISON:  Head CT without contrast 10/14/2016. Cervical spine MRI 11/06/2011. 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 Posterior fusion hardware at C7-T1 with evidence of posterior element ankylosis at that level. Bulky anterior endplate osteophytosis without ankylosis at C2-C3. Bulky partially calcified ligamentous hypertrophy about the odontoid. No acute osseous abnormality identified. Upper chest: Layering bilateral pleural effusions, small to moderate and greater on the left. Mild compressive atelectasis greater on the left. No superior mediastinal lymphadenopathy. Other neck: Coarsely calcified benign appearing left thyroid nodule. Bulky 14 mm right submandibular gland sialolith. Otherwise negative. Aortic arch: Pulmonary artery dominant contrast bolus timing. Visible central pulmonary arteries are patent. Mild aortic arch calcified atherosclerosis. No great vessel origin stenosis. Right carotid system: Mild right CCA plaque without stenosis. Minimal right carotid bifurcation plaque. Negative cervical right ICA. Left carotid system: Mild left CCA plaque without stenosis. Mild soft and  calcified plaque at the left carotid bifurcation including the ICA origin and bulb without stenosis. Otherwise negative cervical left ICA. Vertebral arteries: No proximal right subclavian artery stenosis. Calcified plaque at the right vertebral artery origin with mild to moderate stenosis. Obscured proximal right V2 segment due to hardware streak artifact. The visible right V 2 segment is patent but non dominant. No definite additional right vertebral artery stenosis to the skullbase. No proximal left subclavian artery stenosis despite soft and calcified plaque. Bulky calcified plaque at the left vertebral artery origin with moderate to severe stenosis (series 9, image 175). Dominant left vertebral artery. The proximal left V2 segment is obscured similar to that on the right. The from the visible left V2 segment the left vertebral artery is patent to the skullbase without stenosis. CTA HEAD Posterior circulation: Moderate to severe stenosis of the distal right vertebral artery as it crosses the dura is suspected (series 9, image 144), but the right V4 segment is patent. Bulky right V4 calcified plaque with severe stenosis just proximal to the vertebrobasilar junction (series 9, image 129). Bulky distal left vertebral artery calcified plaque also with moderate to severe stenosis proximal to the vertebrobasilar junction. The distal left vertebral artery is mildly dominant. Neither PICA is definitely identified. Patent vertebrobasilar junction and basilar artery. Basilar artery irregularity without stenosis. Irregular but patent bilateral SCA and PCA origins. Mild to moderate bilateral PCA origins stenosis. Bilateral PCA branches are within normal limits. Anterior circulation: Both ICA siphons are patent, but there is severe siphon calcified plaque worse on the right where severe distal cavernous and proximal supraclinoid stenosis occurs (series 8 image 114. Moderate to severe left ICA siphon stenosis in the same  segments. Despite this both carotid termini are patent. Left ACA A1 segment appears dominant and is patent. Anterior communicating artery is patent. However, the right ACA A2 segment is occluded just beyond its origin. The left ACA remains patent. Left MCA M1 segment and bifurcation are patent. No left MCA branch occlusion identified. No M1 stenosis. Right MCA M1 segment and bifurcation are patent. No definite right MCA branch occlusion although the anterior right MCA division is  attenuated. Venous sinuses: Patent on the delayed images. Anatomic variants: Dominant left vertebral artery. Delayed phase: Stable appearance of hypodensity in the inferior right cerebellum since the CT yesterday. Patchy and confluent bilateral cerebral white matter hypodensity. Heterogeneity in the basal ganglia worse on the right. No new cortically based infarct identified. No midline shift, mass effect, or evidence of intracranial mass lesion. No acute intracranial hemorrhage identified. No ventriculomegaly. No abnormal enhancement identified. Review of the MIP images confirms the above findings IMPRESSION: 1. No emergent large vessel occlusion. 2. Age indeterminate but probably chronic (See #7) right ACA occlusion beginning at the A2 segment. 3. Mild extracranial but severe carotid siphon atherosclerosis and stenosis. Bulky calcified plaque results in severe right and moderate to severe left ICA stenosis at the distal cavernous and/or supraclinoid segments. Both carotid termini remain patent. 4. Anterior division right MCA branches appear attenuated, but no discrete branch occlusion or proximal MCA stenosis is identified. 5. Severe posterior circulation atherosclerosis with severe distal right vertebral artery stenosis in the posterior fossa, and moderate to severe stenosis of the dominant left vertebral artery at both its origin and distal segment. 6. Up to moderate stenosis at both PCA origins. 7. Stable CT appearance of the brain  since yesterday. Unchanged appearance of the right PICA territory infarct which is age indeterminate. And no other acute cortically based infarct has developed. 8. Small to moderate left greater than right layering pleural effusions. 9. Widespread chronic cervical spine ankylosis and postoperative arthrodesis at the cervicothoracic junction. Electronically Signed   By: Genevie Ann M.D.   On: 10/15/2016 11:37   Dg Chest 2 View  Result Date: 10/19/2016 CLINICAL DATA:  83 year old presenting with an episode of choking earlier today. Current history of diabetes, hypertension and CHF. EXAM: CHEST  2 VIEW COMPARISON:  10/14/2016, 11/15/2015 and earlier. FINDINGS: Cardiac silhouette moderately enlarged, unchanged. Left subclavian biventricular pacer unchanged. Thoracic aorta mildly atherosclerotic, unchanged. Consolidation in the left lower lobe with partial silhouetting of the left hemidiaphragm. Mild atelectasis at the right lung base. Lungs otherwise clear. Pulmonary vascularity normal without evidence of pulmonary edema. IMPRESSION: 1. Left lower lobe atelectasis and/or pneumonia. 2. Mild right basilar atelectasis. 3. Stable cardiomegaly without pulmonary edema. Electronically Signed   By: Evangeline Dakin M.D.   On: 10/19/2016 21:33   Ct Head Wo Contrast  Result Date: 10/14/2016 CLINICAL DATA:  Altered mental status EXAM: CT HEAD WITHOUT CONTRAST TECHNIQUE: Contiguous axial images were obtained from the base of the skull through the vertex without intravenous contrast. COMPARISON:  October 18, 2011 FINDINGS: Brain: There is moderate diffuse atrophy. There is a focal area of decreased attenuation in the inferior, posterior aspect of the right cerebellum, likely a recent infarct involving a portion of the right posterior inferior cerebral artery distribution. No other evidence of recent/acute infarct. There is patchy small vessel disease throughout the centra semiovale bilaterally. There is evidence of prior infarct  involving a portion of the head of the caudate nucleus on the right and anterior limb of the right internal capsule. There is no well-defined mass, hemorrhage, extra-axial fluid collection, or midline shift. Vascular: There is no appreciable hyperdense vessel. There is calcification in each carotid siphon region. There is also calcification in each distal vertebral artery as well as in the right carotid artery in the petrous region. Skull: Bones are osteoporotic.  Bony calvarium appears intact. Sinuses/Orbits: There is opacification of multiple ethmoid air cells the right. There is mucosal thickening throughout multiple ethmoid air cells as well.  There is mild mucosal thickening inferior left maxillary antrum anteriorly. There is mucosal thickening in a portion of the left sphenoid sinus. Orbits appear symmetric bilaterally. Other: Mastoid air cells are clear. IMPRESSION: Findings felt to represent an acute/recent infarct in the distribution of a portion of the right posterior inferior cerebellar artery. There is atrophy with extensive supratentorial small vessel disease. Prior infarct noted involving the head of the caudate nucleus on the right as well as a portion of the anterior limb of the right internal capsule. No hemorrhage.  No extra-axial fluid. Extensive arteriovascular calcification. Foci of paranasal sinus disease, most pronounced in the right ethmoid air cell complex. Electronically Signed   By: Lowella Grip III M.D.   On: 10/14/2016 14:48   Ct Angio Neck W Or Wo Contrast  Result Date: 10/15/2016 CLINICAL DATA:  83 year old male with confusion, altered mental status. Age indeterminate Right PICA territory infarct on head CT yesterday. EXAM: CT ANGIOGRAPHY HEAD AND NECK TECHNIQUE: Multidetector CT imaging of the head and neck was performed using the standard protocol during bolus administration of intravenous contrast. Multiplanar CT image reconstructions and MIPs were obtained to evaluate the  vascular anatomy. Carotid stenosis measurements (when applicable) are obtained utilizing NASCET criteria, using the distal internal carotid diameter as the denominator. CONTRAST:  50 mL Isovue 370 COMPARISON:  Head CT without contrast 10/14/2016. Cervical spine MRI 11/06/2011. 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 Posterior fusion hardware at C7-T1 with evidence of posterior element ankylosis at that level. Bulky anterior endplate osteophytosis without ankylosis at C2-C3. Bulky partially calcified ligamentous hypertrophy about the odontoid. No acute osseous abnormality identified. Upper chest: Layering bilateral pleural effusions, small to moderate and greater on the left. Mild compressive atelectasis greater on the left. No superior mediastinal lymphadenopathy. Other neck: Coarsely calcified benign appearing left thyroid nodule. Bulky 14 mm right submandibular gland sialolith. Otherwise negative. Aortic arch: Pulmonary artery dominant contrast bolus timing. Visible central pulmonary arteries are patent. Mild aortic arch calcified atherosclerosis. No great vessel origin stenosis. Right carotid system: Mild right CCA plaque without stenosis. Minimal right carotid bifurcation plaque. Negative cervical right ICA. Left carotid system: Mild left CCA plaque without stenosis. Mild soft and calcified plaque at the left carotid bifurcation including the ICA origin and bulb without stenosis. Otherwise negative cervical left ICA. Vertebral arteries: No proximal right subclavian artery stenosis. Calcified plaque at the right vertebral artery origin with mild to moderate stenosis. Obscured proximal right V2 segment due to hardware streak artifact. The visible right V 2 segment is patent but non dominant. No definite additional right vertebral artery stenosis to the skullbase. No proximal left subclavian artery stenosis despite soft and calcified  plaque. Bulky calcified plaque at the left vertebral artery origin with moderate to severe stenosis (series 9, image 175). Dominant left vertebral artery. The proximal left V2 segment is obscured similar to that on the right. The from the visible left V2 segment the left vertebral artery is patent to the skullbase without stenosis. CTA HEAD Posterior circulation: Moderate to severe stenosis of the distal right vertebral artery as it crosses the dura is suspected (series 9, image 144), but the right V4 segment is patent. Bulky right V4 calcified plaque with severe stenosis just proximal to the vertebrobasilar junction (series 9, image 129). Bulky distal left vertebral artery calcified plaque also with moderate to severe stenosis proximal to the vertebrobasilar junction. The distal left vertebral artery is mildly dominant. Neither PICA  is definitely identified. Patent vertebrobasilar junction and basilar artery. Basilar artery irregularity without stenosis. Irregular but patent bilateral SCA and PCA origins. Mild to moderate bilateral PCA origins stenosis. Bilateral PCA branches are within normal limits. Anterior circulation: Both ICA siphons are patent, but there is severe siphon calcified plaque worse on the right where severe distal cavernous and proximal supraclinoid stenosis occurs (series 8 image 114. Moderate to severe left ICA siphon stenosis in the same segments. Despite this both carotid termini are patent. Left ACA A1 segment appears dominant and is patent. Anterior communicating artery is patent. However, the right ACA A2 segment is occluded just beyond its origin. The left ACA remains patent. Left MCA M1 segment and bifurcation are patent. No left MCA branch occlusion identified. No M1 stenosis. Right MCA M1 segment and bifurcation are patent. No definite right MCA branch occlusion although the anterior right MCA division is attenuated. Venous sinuses: Patent on the delayed images. Anatomic variants:  Dominant left vertebral artery. Delayed phase: Stable appearance of hypodensity in the inferior right cerebellum since the CT yesterday. Patchy and confluent bilateral cerebral white matter hypodensity. Heterogeneity in the basal ganglia worse on the right. No new cortically based infarct identified. No midline shift, mass effect, or evidence of intracranial mass lesion. No acute intracranial hemorrhage identified. No ventriculomegaly. No abnormal enhancement identified. Review of the MIP images confirms the above findings IMPRESSION: 1. No emergent large vessel occlusion. 2. Age indeterminate but probably chronic (See #7) right ACA occlusion beginning at the A2 segment. 3. Mild extracranial but severe carotid siphon atherosclerosis and stenosis. Bulky calcified plaque results in severe right and moderate to severe left ICA stenosis at the distal cavernous and/or supraclinoid segments. Both carotid termini remain patent. 4. Anterior division right MCA branches appear attenuated, but no discrete branch occlusion or proximal MCA stenosis is identified. 5. Severe posterior circulation atherosclerosis with severe distal right vertebral artery stenosis in the posterior fossa, and moderate to severe stenosis of the dominant left vertebral artery at both its origin and distal segment. 6. Up to moderate stenosis at both PCA origins. 7. Stable CT appearance of the brain since yesterday. Unchanged appearance of the right PICA territory infarct which is age indeterminate. And no other acute cortically based infarct has developed. 8. Small to moderate left greater than right layering pleural effusions. 9. Widespread chronic cervical spine ankylosis and postoperative arthrodesis at the cervicothoracic junction. Electronically Signed   By: Genevie Ann M.D.   On: 10/15/2016 11:37   Dg Chest Portable 1 View  Result Date: 10/14/2016 CLINICAL DATA:  Acute stroke with shortness of breath. EXAM: PORTABLE CHEST 1 VIEW COMPARISON:   11/15/2015 FINDINGS: 1543 hours. The cardio pericardial silhouette is enlarged. Insert basilar atelectasis. No airspace pulmonary edema or focal lung consolidation. No substantial pleural effusion. Left permanent pacemaker again noted. Degenerative changes evident and right shoulder. Telemetry leads overlie the chest. IMPRESSION: Cardiomegaly with basilar atelectasis. Electronically Signed   By: Misty Stanley M.D.   On: 10/14/2016 15:48    Lab Data:  CBC:  Recent Labs Lab 11/01/16 0914  WBC 9.8  HGB 13.9  HCT 42.0  MCV 96.3  PLT 650   Basic Metabolic Panel:  Recent Labs Lab 10/27/16 0612 10/28/16 0913 11/01/16 0914 11/01/16 1144 11/01/16 2239 11/02/16 0902  NA 147* 146* 152*  --  151* 152*  K 4.9 4.4 4.3  --  4.5  --   CL 113* 115* 121*  --  122*  --   CO2  23 17* 19*  --  22  --   GLUCOSE 102* 117* 148*  --  154*  --   BUN 23* 27* 26*  --  26*  --   CREATININE 1.29* 1.31* 1.41* 1.41* 1.34*  --   CALCIUM 9.6 9.4 9.1  --  9.1  --    GFR: Estimated Creatinine Clearance: 43.1 mL/min (A) (by C-G formula based on SCr of 1.34 mg/dL (H)). Liver Function Tests:  Recent Labs Lab 11/01/16 0914  AST 26  ALT 63  ALKPHOS 102  BILITOT 1.7*  PROT 6.2*  ALBUMIN 3.0*   No results for input(s): LIPASE, AMYLASE in the last 168 hours. No results for input(s): AMMONIA in the last 168 hours. Coagulation Profile: No results for input(s): INR, PROTIME in the last 168 hours. Cardiac Enzymes: No results for input(s): CKTOTAL, CKMB, CKMBINDEX, TROPONINI in the last 168 hours. BNP (last 3 results) No results for input(s): PROBNP in the last 8760 hours. HbA1C: No results for input(s): HGBA1C in the last 72 hours. CBG:  Recent Labs Lab 11/01/16 0609 11/01/16 1133 11/01/16 1705 11/01/16 2248 11/02/16 0656  GLUCAP 94 125* 163* 163* 135*   Lipid Profile: No results for input(s): CHOL, HDL, LDLCALC, TRIG, CHOLHDL, LDLDIRECT in the last 72 hours. Thyroid Function Tests: No  results for input(s): TSH, T4TOTAL, FREET4, T3FREE, THYROIDAB in the last 72 hours. Anemia Panel: No results for input(s): VITAMINB12, FOLATE, FERRITIN, TIBC, IRON, RETICCTPCT in the last 72 hours. Urine analysis:    Component Value Date/Time   COLORURINE AMBER (A) 11/01/2016 0954   APPEARANCEUR CLEAR 11/01/2016 0954   LABSPEC 1.019 11/01/2016 0954   PHURINE 5.0 11/01/2016 0954   GLUCOSEU NEGATIVE 11/01/2016 0954   GLUCOSEU NEGATIVE 10/15/2014 0828   HGBUR NEGATIVE 11/01/2016 0954   BILIRUBINUR NEGATIVE 11/01/2016 0954   BILIRUBINUR negative 10/16/2015 0857   KETONESUR NEGATIVE 11/01/2016 0954   PROTEINUR 100 (A) 11/01/2016 0954   UROBILINOGEN 0.2 10/16/2015 0857   UROBILINOGEN 1.0 10/15/2014 0828   NITRITE NEGATIVE 11/01/2016 0954   LEUKOCYTESUR NEGATIVE 11/01/2016 0954     Ripudeep Rai M.D. [facility] 11/02/2016, 10:57 AM  Pager: [phone] Between 7am to 7pm - call Pager - [phone]  After 7pm go to [email or web address] - password [email or web address]  Call night coverage person covering after 7pm

## 2016-11-02 NOTE — Consult Note (Signed)
Consultation Note Date: 11/02/2016   Patient Name: [name]  DOB: 01/31/1931  MRN: [id number]  Age / Sex: 83 y.o., male  PCP: Elayne Snare, MD Referring Physician: Meredith Staggers, MD  Reason for Consultation: Establishing goals of care  HPI/Patient Profile: 83 y.o. male  with past medical history of a neuromuscular disorder, CHF (EF 30-35%) CKD 3, prostate cancer, complete heart block s/p pacemaker and AICD placement who was admitted on 10/18/2016 for rehabilitation after an acute right cerebellar stroke.  In CIR his PO intake has declined and his sodium has risen to over 150.  He is not progressing.   Clinical Assessment and Goals of Care:  I have reviewed medical records including EPIC notes, labs and imaging, received report from the care team, assessed the patient and then met at the bedside along with his niece and executor, Denny Peon  to discuss diagnosis prognosis, GOC, EOL wishes, disposition and options.  I introduced Palliative Medicine as specialized medical care for people living with serious illness. It focuses on providing relief from the symptoms and stress of a serious illness. The goal is to improve quality of life for both the patient and the family.  We discussed a brief life review of the patient. He served in [employer] and then had a career with [employer].  He was living at home with his daughter until the most recent admission to [facility] for stroke. He was married but his wife passed after being in a vegetative state for 10 years in a facility.  For this reason Mr. Mantell made his family promise to never place him in a facility.  He lost a son approximately 5 years ago.  He has 1 daughter left Willette Cluster).  He also has a brother in [place] and two nieces here in [place].    Marva expressed concern about the plan for Mr. Haub to return to his home and be cared for by  [facility].  Per Alvira Monday is an IVDA who is completely unreliable.  The house is not clean and the utilities are turned off.  When Mr. Dudash returned home after his hospitalization for complete heart block, Willette Cluster reportedly locked the door and would not let home health services in the house when they arrived.  Per Ezzard Flax - it is not safe or appropriate for Mr. Capri to go home to be cared for by [facility].  At this point Mr. Vigen is not eating on his own.  He will eat very little with a significant amount of coaxing. He will wake and smile, say a few words and then quickly fall asleep.  He is unable to get out of bed without great assistance.    Marva and I discussed the possibility that Mr. Rubert may not be able to rehab after his stroke.  He may be appropriate for [facility].  I committed to [facility] that I will follow Mr. Cuda during his stay to see if he is going to eat. I explained to [facility]  that a PEG tube is contra-indicated in an elderly gentleman with irreversible advanced cerebral vascular disease  who may be near end of life.  Marva felt that Willette Cluster would not want Mr. Southwood to have a PEG.  Questions and concerns were addressed.  Hard Choices booklet left for review. The family was encouraged to call with questions or concerns.  PMT will continue to support holistically.   Primary Decision Maker:  NEXT OF KIN likely the majority of the patients reasonably available siblings and adult children.      SUMMARY OF RECOMMENDATIONS    No PEG tube I have called Lynette with no return call.  I will reach out to her again tomorrow. I will follow Mr. Wojcicki for a day I so - I believe he is eligible for hospice house placement. If returning to his home with Willette Cluster is being considered - I recommend a social work safety check as part of discharge planning (to be done prior to discharge) to ensure the utilities are turned on and the house isin appropriate condition  Code Status/Advance  Care Planning:  Full code.  If the patient continues to decline and becomes hemodynamically unstable, I recommend that two MDs confer to determine if it is appropriate to change his code status to DNR while in the hospital    Symptom Management:   Per primary team.  Palliative Prophylaxis:   Aspiration and Delirium Protocol  Psycho-social/Spiritual:   Desire for further Chaplaincy support: yes  Prognosis:   < 2 weeks he does not eat / drink, is bedbound after recent stroke  Discharge Planning: To Be Determined      Primary Diagnoses: Present on Admission: . Acute encephalopathy . Essential hypertension . Chronic combined systolic and diastolic heart failure ([place]) . DM type 2 with diabetic peripheral neuropathy ([place]) . Dementia without behavioral disturbance   I have reviewed the medical record, interviewed the patient and family, and examined the patient. The following aspects are pertinent.  Past Medical History:  Diagnosis Date  . Cancer ([place])    hx of prostate ca  . Cataracts, bilateral    hx of  . CHF (congestive heart failure) ([place])   . Diabetes mellitus   . ED (erectile dysfunction)   . Hyperlipidemia   . Hypertension   . Neuromuscular disorder ([facility])    numbness in hand/cervical issues  . Pneumonia    hx of  . Syncope    hx of  . Urination frequency    Social History   Social History  . Marital status: Widowed    Spouse name: N/A  . Number of children: N/A  . Years of education: N/A   Occupational History  . retired     Actuary   Social History Main Topics  . Smoking status: Never Smoker  . Smokeless tobacco: Never Used  . Alcohol use No     Comment: daughter states that used to be "heavy weekend drinker" but quit 10 + years ago  . Drug use: No  . Sexual activity: Not Currently   Other Topics Concern  . None   Social History Narrative  . None   Family History  Problem Relation Age of Onset  . Diabetes  Mother   . Hypertension Father    Scheduled Meds: . aspirin EC  81 mg Oral Daily  . enoxaparin (LOVENOX) injection  40 mg Subcutaneous Q24H  . insulin aspart  0-9 Units Subcutaneous TID WC  . insulin glargine  10  Units Subcutaneous Daily  . mouth rinse  15 mL Mouth Rinse BID  . multivitamin with minerals  1 tablet Oral Daily   Continuous Infusions: . dextrose 75 mL/hr at 11/02/16 0859   PRN Meds:.acetaminophen **OR** acetaminophen (TYLENOL) oral liquid 160 mg/5 mL **OR** acetaminophen, bisacodyl, ondansetron **OR** ondansetron (ZOFRAN) IV, sorbitol, traZODone No Known Allergies Review of Systems pleasantly demented  Physical Exam  Lethargic frail thin gentleman, pleasantly demented.  Quickly falls asleep after a few questions Dry mucous membranes CV rrr resp no distress Abdomen thin, nt, nd  Vital Signs: BP 125/81 (BP Location: Left Arm)   Pulse 80   Temp 97.7 F (36.5 C) (Axillary)   Resp 18   Ht 5' 10"  (1.778 m)   Wt 73.9 kg (163 lb)   SpO2 100%   BMI 23.39 kg/m  Pain Assessment: 0-10   Pain Score: 0-No pain   SpO2: SpO2: 100 % O2 Device:SpO2: 100 % O2 Flow Rate: .   IO: Intake/output summary:  Intake/Output Summary (Last 24 hours) at 11/02/16 1927 Last data filed at 11/02/16 1746  Gross per 24 hour  Intake              270 ml  Output              300 ml  Net              -30 ml    LBM: Last BM Date: 11/02/16 Baseline Weight: Weight: 79.3 kg (174 lb 12.8 oz) Most recent weight: Weight: 73.9 kg (163 lb)     Palliative Assessment/Data:   Flowsheet Rows     Most Recent Value  Intake Tab  Unit at Time of Referral  Other (Comment)  Palliative Care Primary Diagnosis  Neurology  Date Notified  11/02/16  Palliative Care Type  New Palliative care  Reason for referral  Clarify Goals of Care  Date of Admission  10/18/16  Date first seen by Palliative Care  11/02/16  # of days Palliative referral response time  0 Day(s)  # of days IP prior to Palliative  referral  15  Clinical Assessment  Palliative Performance Scale Score  30%  Psychosocial & Spiritual Assessment  Palliative Care Outcomes  Patient/Family meeting held?  Yes  Who was at the meeting?  patient and niece Ezzard Flax [place]  Palliative Care Outcomes  Clarified goals of care      Time In: 4:00 Time Out: 5:28 Time Total: 88 min. Greater than 50%  of this time was spent counseling and coordinating care related to the above assessment and plan.  Signed by: Imogene Burn, PA-C Palliative Medicine Pager: [phone]  Please contact Palliative Medicine Team phone at [phone] for questions and concerns.  For individual provider: See Shea Evans

## 2016-11-02 NOTE — Progress Notes (Signed)
[facility] PHYSICAL MEDICINE & REHABILITATION     PROGRESS NOTE    Subjective/Complaints:  Patient appears much brighter today, "I'm doing better." Appreciate family medicine consult. Receiving IV hydration.  ROS: Limited due to cognitive/behavioral    Objective: Vital Signs: Blood pressure 115/82, pulse 81, temperature 97.6 F (36.4 C), temperature source Oral, resp. rate 20, height 5\' 10"  (1.778 m), weight 73.9 kg (163 lb), SpO2 95 %. No results found.  Recent Labs  11/01/16 0914  WBC 9.8  HGB 13.9  HCT 42.0  PLT 215    Recent Labs  11/01/16 0914 11/01/16 1144 11/01/16 2239 11/02/16 0902  NA 152*  --  151* 152*  K 4.3  --  4.5  --   CL 121*  --  122*  --   GLUCOSE 148*  --  154*  --   BUN 26*  --  26*  --   CREATININE 1.41* 1.41* 1.34*  --   CALCIUM 9.1  --  9.1  --    CBG (last 3)   Recent Labs  11/01/16 1705 11/01/16 2248 11/02/16 0656  GLUCAP 163* 163* 135*    Wt Readings from Last 3 Encounters:  11/02/16 73.9 kg (163 lb)  10/16/16 75 kg (165 lb 5.5 oz)  09/03/16 79.4 kg (175 lb)    Physical Exam:  Constitutional: no distress HENT:  Head: Normocephalic and atraumatic.  Eyes: EOMI  Neck: Normal range of motion. Neck supple. No thyromegaly present.  Cardiovascular: RRR without murmur. No JVD    Respiratory: CTA Bilaterally without wheezes or rales. Normal effort   GI: Soft. Bowel sounds are normal. He exhibits no distension.  Musculoskeletal: He exhibits no edema or tenderness.  Neurological:  Awake and oriented to self only.  Motor: 4-4+/5 through all 4's.  DTRs 3+ RUE/RLE.   Skin: Skin is warm and dry.  Psychiatric: Does not answer orientation questions, is alert today, however, and conversant   Assessment/Plan: 1. Functional and cognitive deficits secondary to encephalopathy and recent right PICA infarct which require 3+ hours per day of interdisciplinary therapy in a comprehensive inpatient rehab setting. Physiatrist is providing  close team supervision and 24 hour management of active medical problems listed below. Physiatrist and rehab team continue to assess barriers to discharge/monitor patient progress toward functional and medical goals.  Function:  Bathing Bathing position Bathing activity did not occur: N/A (night bath) Position: Sitting EOB  Bathing parts Body parts bathed by patient: Right arm, Left arm, Chest, Abdomen, Front perineal area Body parts bathed by helper: Buttocks, Right upper leg, Left upper leg, Right lower leg, Left lower leg  Bathing assist Assist Level: Touching or steadying assistance(Pt > 75%)      Upper Body Dressing/Undressing Upper body dressing   What is the patient wearing?: Pull over shirt/dress     Pull over shirt/dress - Perfomed by patient: Thread/unthread right sleeve Pull over shirt/dress - Perfomed by helper: Thread/unthread left sleeve, Put head through opening, Pull shirt over trunk        Upper body assist Assist Level: Touching or steadying assistance(Pt > 75%)      Lower Body Dressing/Undressing Lower body dressing   What is the patient wearing?: Pants, Non-skid slipper socks   Underwear - Performed by helper: Thread/unthread right underwear leg, Thread/unthread left underwear leg, Pull underwear up/down Pants- Performed by patient: Pull pants up/down Pants- Performed by helper: Thread/unthread right pants leg, Thread/unthread left pants leg, Pull pants up/down   Non-skid slipper socks- Performed by  helper: Don/doff right sock, Don/doff left sock                  Lower body assist Assist for lower body dressing: Touching or steadying assistance (Pt > 75%)      Toileting Toileting Toileting activity did not occur: No continent bowel/bladder event   Toileting steps completed by helper: Adjust clothing prior to toileting, Performs perineal hygiene, Adjust clothing after toileting Toileting Assistive Devices: Grab bar or rail  Toileting assist Assist  level: Touching or steadying assistance (Pt.75%)   Transfers Chair/bed transfer   Chair/bed transfer method: Squat pivot Chair/bed transfer assist level: Total assist (Pt < 25%) (+3 due to decreased arousal ) Chair/bed transfer assistive device: Mechanical lift Mechanical lift: Stedy   Locomotion Ambulation Ambulation activity did not occur: Safety/medical concerns   Max distance: 15ft Assist level: Moderate assist (Pt 50 - 74%)   Wheelchair   Type: Manual Max wheelchair distance: 75 Assist Level: Touching or steadying assistance (Pt > 75%)  Cognition Comprehension Comprehension assist level: Understands basic less than 25% of the time/ requires cueing >75% of the time  Expression Expression assist level: Expresses basis less than 25% of the time/requires cueing >75% of the time.  Social Interaction Social Interaction assist level: Interacts appropriately less than 25% of the time. May be withdrawn or combative.  Problem Solving Problem solving assist level: Solves basic less than 25% of the time - needs direction nearly all the time or does not effectively solve problems and may need a restraint for safety  Memory Memory assist level: Recognizes or recalls less than 25% of the time/requires cueing greater than 75% of the time   Medical Problem List and Plan: 1.  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   -Continue CIR PT, OT, speech,  -Family is still thinking about taking patient home, agree with palliative care consult recommended by family practice to establish goals of care   2.  DVT Prophylaxis/Anticoagulation: Subcutaneous Lovenox.   3. Pain Management: Tylenol as needed 4. Mood: Provide emotional support 5. Neuropsych: This patient Is not capable of making decisions on his own behalf. 6. Skin/Wound Care: Routine skin checks 7.  Fluids/Electrolytes/Nutrition:   -potassium normalized -continue to encourage po.on D1/Nectar diet still   - 8. Diabetes mellitus with peripheral neuropathy. Hemoglobin A1c 7.5.    -fair control but intake very inconsistent  -decreased lantus insulin  to 10 units  qam last week---continue at current dose    -discontinue metformin done on 6/23    9. CKD stage III. Stable but at risk for worsening with intake issues10. CAD with pacemaker. Continue aspirin. No chest pain or shortness of breath 11. Chronic systolic congestive heart failure. Monitor for any signs of fluid overload   -daily weights===last 4 d74.6kg 74.8 , 73.1, 73.9 12. History of Hypertension. Controlled off antihypertensive medications.   - Vitals:   11/01/16 1535 11/02/16 0435  BP: (!) 116/96 115/82  Pulse: 85 81  Resp: (!) 22 20  Temp: 97.2 F (36.2 C) 97.6 F (36.4 C)      LOS (Days) 15 A FACE TO FACE EVALUATION WAS PERFORMED  Charlett Blake, MD 11/02/2016 10:48 AM

## 2016-11-02 NOTE — Progress Notes (Signed)
Nutrition Follow-up  DOCUMENTATION CODES:   Not applicable  INTERVENTION:   -Continue Snacks TID (Mighty Shake) -Continue Magic Cup TID with meals -MVI daily  -Given prolonged PO intake, pt may benefit from alternative means of nutrition and hydration (short term TF via cortrak tube vs PEG) based upon established goals of care  NUTRITION DIAGNOSIS:   Inadequate oral intake related to lethargy/confusion as evidenced by meal completion < 25%.  Ongoing  GOAL:   Patient will meet greater than or equal to 90% of their needs  Progressing  MONITOR:   PO intake, Supplement acceptance, Diet advancement, Labs, Weight trends, Skin, I & O's  REASON FOR ASSESSMENT:   Consult Assessment of nutrition requirement/status  ASSESSMENT:   Pt admitted to CIR for dx of decreased functional mobility secondary to acute recent infarct distribution of a portion of the right posterior inferior cerebellar artery with underlying dementia acute encephalopathy.   Pt sitting in wheelchair up at nurses station at time of visit. Pt was confused any unable to provide further hx.   Pt's intake continues to be very poor. Noted meal completion 0%. Per nursing note, pt only able to consume 4-5 sips of Mighty Shake supplement last night. Also noted minimal meal completion at breakfast-  pt consumed 1.5 nectar thickened juices and 1-2 bites of yogurt.   Per MD notes, strongly recommend palliative care consult for goals of care.   Labs reviewed: Na: 152, CBGS: 135-204.   Diet Order:  DIET - DYS 1 Room service appropriate? Yes; Fluid consistency: Nectar Thick  Skin:  Reviewed, no issues  Last BM:  11/02/16  Height:   Ht Readings from Last 1 Encounters:  10/18/16 5\' 10"  (1.778 m)    Weight:   Wt Readings from Last 1 Encounters:  11/02/16 163 lb (73.9 kg)    Ideal Body Weight:  75.5 kg  BMI:  Body mass index is 23.39 kg/m.  Estimated Nutritional Needs:   Kcal:  1650-1850  Protein:  80-95  grams  Fluid:  1.6-1.8 L  EDUCATION NEEDS:   Education needs addressed  [name] A. Jimmye Norman, RD, LDN, CDE Pager: [phone] After hours Pager: [phone]

## 2016-11-02 NOTE — Plan of Care (Signed)
Problem: RH BLADDER ELIMINATION Goal: RH STG MANAGE BLADDER WITH ASSISTANCE STG Manage Bladder With mod Assistance  Outcome: Not Progressing Total straight cath

## 2016-11-02 NOTE — Progress Notes (Signed)
Speech Language Pathology Daily Session Note  Patient Details  Name: [name] MRN: [id number] Date of Birth: 25-Sep-1930  Today's Date: 11/02/2016 SLP Individual Time: 1445-1515 SLP Individual Time Calculation (min): 30 min  Short Term Goals: Week 2: SLP Short Term Goal 1 (Week 2): Pt will consume therapeutic trials of thin liquids with max assist multimodal cues for use of swallowing precautions.   SLP Short Term Goal 1 - Progress (Week 2): Discontinued (comment) (d/t overall decline in cognitive and physical state) SLP Short Term Goal 2 (Week 2): Pt will maintain alertness for 1 minute intervals with max assist multimodal cues.   SLP Short Term Goal 3 (Week 2): Pt will sustain his attention to task for 30 second intervals with max assist multimodal cues for redirection.   SLP Short Term Goal 4 (Week 2): Pt will utilize external aids to orient to place, date, and situation with max assist multimodal cues.   SLP Short Term Goal 4 - Progress (Week 2): Discontinued (comment) (d/t decline in cognitive ability) SLP Short Term Goal 5 (Week 2): Pt will consume dys 1 textures and nectar liquids with Max assist verbal cues for use of swallowing precautions and minimal overt s/s of aspiration   Skilled Therapeutic Interventions: Skilled treatment session focused on self-care and home management of dysphagia 1 diet and nectar thick liquids. Education provided to daughter and friend (who will be assisting in taking care of pt) on current diet, current swallow function and cognitive decline. Pt's daughter states that before most recent hospitalization, he was declining cognitively and they had to "beg him" to eat. Education provided that with further decline, pt may eat and drink less. Education provided and handout given on puree diet and nectar thick liquids. Resources given for obtaining thickening agent. Education provided on s/s of aspiration and aspiration pneumonia. Pt's daughter to return on next  day and would benefit from return demonstration of thickening liquids. All questions answered to family's satisfaction.      Function:    Cognition Comprehension Comprehension assist level: Understands basic less than 25% of the time/ requires cueing >75% of the time  Expression   Expression assist level: Expresses basis less than 25% of the time/requires cueing >75% of the time.  Social Interaction Social Interaction assist level: Interacts appropriately less than 25% of the time. May be withdrawn or combative.  Problem Solving Problem solving assist level: Solves basic less than 25% of the time - needs direction nearly all the time or does not effectively solve problems and may need a restraint for safety  Memory Memory assist level: Recognizes or recalls less than 25% of the time/requires cueing greater than 75% of the time    Pain    Therapy/Group: Individual Therapy  Elorah Dewing 11/02/2016, 3:34 PM

## 2016-11-02 NOTE — Plan of Care (Signed)
Problem: RH Balance Goal: LTG Patient will maintain dynamic sitting balance (PT) LTG:  Patient will maintain dynamic sitting balance with assistance during mobility activities (PT)  downgraded d/t functional decline Goal: LTG Patient will maintain dynamic standing balance (PT) LTG:  Patient will maintain dynamic standing balance with assistance during mobility activities (PT)  downgraded d/t functional decline  Problem: RH Bed Mobility Goal: LTG Patient will perform bed mobility with assist (PT) LTG: Patient will perform bed mobility with assistance, with/without cues (PT).  downgraded d/t functional decline  Problem: RH Bed to Chair Transfers Goal: LTG Patient will perform bed/chair transfers w/assist (PT) LTG: Patient will perform bed/chair transfers with assistance, with/without cues (PT).  downgraded d/t functional decline  Problem: RH Car Transfers Goal: LTG Patient will perform car transfers with assist (PT) LTG: Patient will perform car transfers with assistance (PT).  downgraded d/t functional decline  Problem: RH Ambulation Goal: LTG Patient will ambulate in controlled environment (PT) LTG: Patient will ambulate in a controlled environment, # of feet with assistance (PT).  downgraded d/t functional decline Goal: LTG Patient will ambulate in home environment (PT) LTG: Patient will ambulate in home environment, # of feet with assistance (PT).  Outcome: Not Applicable Date Met: 33/53/31 D/c due to functional decline  Problem: RH Wheelchair Mobility Goal: LTG Patient will propel w/c in controlled environment (PT) LTG: Patient will propel wheelchair in controlled environment, # of feet with assist (PT)  downgraded d/t functional decline  Problem: RH Stairs Goal: LTG Patient will ambulate up and down stairs w/assist (PT) LTG: Patient will ambulate up and down # of stairs with assistance (PT)  D/c due to functional decline  Problem: RH Memory Goal: LTG Patient demonstrate  ability for day to day recall (PT) LTG:  Patient will demonstrate ability for day to day recall/carryover during mobility activities with assist (PT)  Outcome: Not Applicable Date Met: 74/09/92 D/c due to functional cognitive decline

## 2016-11-02 NOTE — Progress Notes (Signed)
Occupational Therapy Session Note  Patient Details  Name: [name] MRN: [id number] Date of Birth: Mar 06, 1931  Today's Date: 11/02/2016 OT Individual Time: 1330-1400 OT Individual Time Calculation (min): 30 min    Short Term Goals: Week 2:  OT Short Term Goal 1 (Week 2): Pt will complete stand pivot transfer to toilet with ModA OT Short Term Goal 2 (Week 2): Pt will demonstrate sustained attention for completing at least 2/3 seated grooming ADL tasks. OT Short Term Goal 3 (Week 2): Pt will complete LB dressing with ModA.  OT Short Term Goal 4 (Week 2): Pt will demonstrate dynamic sitting balance with MinA during functional task completion.   Skilled Therapeutic Interventions/Progress Updates:    Pt resting in w/c upon arrival with daughter present for family education.  Pt required max multimodal cues for arousal and to keep eyes open during session.  Attempted to engage pt in standing at sink for daughter to perform pericare hygiene.  Daughter initially attempted to assist pt in standing but was unsuccessful.  Pt actively resisted standing and pushed against daughter.  Pt required tot A +2 for sit<>stand at sink and mod A for standing at sink.  Discussed with daughter what the plan was if her father resisted active participation at home.  Pt's daughter stated she would have assistance available from a couple of neighbors.  Pt's daughter continues to state that she wants her father to come home. Pt remained in w/c with daughter present awaiting PT session.   Therapy Documentation Precautions:  Precautions Precautions: Fall, ICD/Pacemaker Restrictions Weight Bearing Restrictions: No   Pain:  Pt with no s/s of pain  See Function Navigator for Current Functional Status.   Therapy/Group: Individual Therapy  Leroy Libman 11/02/2016, 2:57 PM

## 2016-11-03 ENCOUNTER — Inpatient Hospital Stay (HOSPITAL_COMMUNITY): Payer: Medicare Other

## 2016-11-03 ENCOUNTER — Encounter (HOSPITAL_COMMUNITY): Payer: Medicare Other | Admitting: Speech Pathology

## 2016-11-03 ENCOUNTER — Inpatient Hospital Stay (HOSPITAL_COMMUNITY): Payer: Medicare Other | Admitting: Occupational Therapy

## 2016-11-03 ENCOUNTER — Inpatient Hospital Stay (HOSPITAL_COMMUNITY): Payer: Medicare Other | Admitting: Physical Therapy

## 2016-11-03 DIAGNOSIS — Z66 Do not resuscitate: Secondary | ICD-10-CM

## 2016-11-03 LAB — BASIC METABOLIC PANEL
Anion gap: 6 (ref 5–15)
BUN: 22 mg/dL — AB (ref 6–20)
CALCIUM: 8.9 mg/dL (ref 8.9–10.3)
CHLORIDE: 117 mmol/L — AB (ref 101–111)
CO2: 24 mmol/L (ref 22–32)
CREATININE: 1.34 mg/dL — AB (ref 0.61–1.24)
GFR calc Af Amer: 55 mL/min — ABNORMAL LOW (ref >60)
GFR calc non Af Amer: 47 mL/min — ABNORMAL LOW (ref >60)
Glucose, Bld: 140 mg/dL — ABNORMAL HIGH (ref 65–99)
Potassium: 3.8 mmol/L (ref 3.5–5.1)
Sodium: 147 mmol/L — ABNORMAL HIGH (ref 135–145)

## 2016-11-03 LAB — URINE CULTURE

## 2016-11-03 LAB — GLUCOSE, CAPILLARY
GLUCOSE-CAPILLARY: 107 mg/dL — AB (ref 65–99)
GLUCOSE-CAPILLARY: 88 mg/dL (ref 65–99)
GLUCOSE-CAPILLARY: 92 mg/dL (ref 65–99)
Glucose-Capillary: 153 mg/dL — ABNORMAL HIGH (ref 65–99)

## 2016-11-03 NOTE — Progress Notes (Signed)
Daily Progress Note   Patient Name: [name]       Date: 11/03/2016 DOB: 1930-06-06  Age: 83 y.o. MRN#: [id number] Attending Physician: Meredith Staggers, MD Primary Care Physician: Elayne Snare, MD Admit Date: 10/18/2016  Reason for Consultation/Follow-up: Establishing goals of care  Subjective: Patient unable to converse.  He is working with speech therapy but not taking any bites.  He is sweet and very sleepy.  I talked with Willette Cluster (dtr) and patient's brother Josph Macho at bedside.  Lynette's friend Angela Nevin arrived as well for support.  We discussed the fact that Mr. Wojtkiewicz is at end of life because of his recent stroke.  He is unable to eat or drink.  He is unable to get out of bed without total assistance.  He sleeps most of the time.  Willette Cluster and Josph Macho agree that he is likely at end of life.    Willette Cluster promised her father she would never put him in a facility (nursing home) and therefore was resistant to the idea of [facility].  Her friend Angela Nevin explained that [facility] is not like a skilled nursing facility at all.    Willette Cluster was preparing to take her father home - but after understanding that his time may be short (days to weeks) and after considering her friend Carla's words about [facility] - Willette Cluster is now considering [facility].  She tells me that her father has medicare and is well insured but she asks about expense.   I referred her to the Hospice representative.  We discussed code status.  Willette Cluster does not want her father coded.  He is a DNR.   Assessment: Not eating, not drinking, not mobile.  Appears comfortable after CVA   Patient Profile/HPI:  83 y.o. male  with past medical history of a neuromuscular disorder, CHF (EF 30-35%) CKD 3, prostate cancer,  complete heart block s/p pacemaker and AICD placement who was admitted on 10/18/2016 for rehabilitation after an acute right cerebellar stroke.  In CIR his PO intake has declined and his sodium has risen to over 150.  He is not progressing.     Length of Stay: 16  Current Medications: Scheduled Meds:  . aspirin EC  81 mg Oral Daily  . enoxaparin (LOVENOX) injection  40 mg Subcutaneous Q24H  .  insulin aspart  0-9 Units Subcutaneous TID WC  . insulin glargine  10 Units Subcutaneous Daily  . mouth rinse  15 mL Mouth Rinse BID  . multivitamin with minerals  1 tablet Oral Daily    Continuous Infusions: . dextrose 75 mL/hr at 11/02/16 2054    PRN Meds: acetaminophen **OR** acetaminophen (TYLENOL) oral liquid 160 mg/5 mL **OR** acetaminophen, bisacodyl, ondansetron **OR** ondansetron (ZOFRAN) IV, sorbitol, traZODone  Physical Exam        Well developed elderly male, not speaking, with speech therapy but unable to participate Awake, demented, not able to speak for than a few words Lethargic Resp no distress  Vital Signs: BP 127/75 (BP Location: Left Arm)   Pulse 75   Temp 97.5 F (36.4 C) (Axillary)   Resp 20   Ht 5\' 10"  (1.778 m)   Wt 75.3 kg (166 lb)   SpO2 100%   BMI 23.82 kg/m  SpO2: SpO2: 100 % O2 Device: O2 Device: Not Delivered O2 Flow Rate:    Intake/output summary:  Intake/Output Summary (Last 24 hours) at 11/03/16 1438 Last data filed at 11/03/16 1044  Gross per 24 hour  Intake              180 ml  Output              800 ml  Net             -620 ml   LBM: Last BM Date: 11/02/16 Baseline Weight: Weight: 79.3 kg (174 lb 12.8 oz) Most recent weight: Weight: 75.3 kg (166 lb)       Palliative Assessment/Data:    Flowsheet Rows     Most Recent Value  Intake Tab  Unit at Time of Referral  Other (Comment)  Palliative Care Primary Diagnosis  Neurology  Date Notified  11/02/16  Palliative Care Type  New Palliative care  Reason for referral  Clarify Goals of  Care  Date of Admission  10/18/16  Date first seen by Palliative Care  11/02/16  # of days Palliative referral response time  0 Day(s)  # of days IP prior to Palliative referral  15  Clinical Assessment  Palliative Performance Scale Score  30%  Psychosocial & Spiritual Assessment  Palliative Care Outcomes  Patient/Family meeting held?  Yes  Who was at the meeting?  patient and niece Denny Peon  Palliative Care Outcomes  Clarified goals of care      Patient Active Problem List   Diagnosis Date Noted  . Embolic stroke involving right cerebellar artery ([place])   . Palliative care encounter   . Goals of care, counseling/discussion   . Hypernatremia   . Acute encephalopathy 10/18/2016  . Tick bite of calf, initial encounter   . Stage 3 chronic kidney disease   . Diabetes mellitus type 2 in nonobese (HCC)   . Coronary artery disease involving native coronary artery of native heart without angina pectoris   . Dementia without behavioral disturbance   . Chronic systolic heart failure ([place])   . Debilitated 11/18/2015  . Neuropathic pain   . Tachycardia   . Pacemaker   . DM type 2 with diabetic peripheral neuropathy ([place])   . Benign essential HTN   . History of syncope   . LBBB (left bundle branch block)   . Acute blood loss anemia   . NSVT (nonsustained ventricular tachycardia) ([place])   . Bradycardia, severe sinus 11/13/2015  . Complete heart block ([place])   . Chronic  combined systolic and diastolic heart failure ([place]) 04/27/2013  . Essential hypertension 04/27/2013  . Left bundle branch block 04/27/2013  . Type II or unspecified type diabetes mellitus with neurological manifestations, uncontrolled 01/15/2013    Palliative Care Plan    Recommendations/Plan:  Will follow up 6/28 to determine if family has chosen [facility].  If so, will then change orders to be comfort focused.   Turn off AICD as patient is now DNR.  Family wants him to pass peacefully.     Code Status:   DNR  Prognosis:   < 2 weeks based on disabling stroke, minimal to no PO intake.  Discharge Planning:  Hospice facility if family agrees  Care plan was discussed with Patient's family, CIR SW   Thank you for allowing the Palliative Medicine Team to assist in the care of this patient.  Total time spent:  60 min.     Greater than 50%  of this time was spent counseling and coordinating care related to the above assessment and plan.  Imogene Burn, PA-C Palliative Medicine  Please contact Palliative MedicineTeam phone at [phone] for questions and concerns between 7 am - 7 pm.   Please see AMION for individual provider pager numbers.

## 2016-11-03 NOTE — Progress Notes (Signed)
Occupational Therapy Session Note  Patient Details  Name: [name] MRN: [id number] Date of Birth: 1930-12-03  Today's Date: 11/03/2016 OT Individual Time: 7353-2992 OT Individual Time Calculation (min): 56 min    Short Term Goals: Week 2:  OT Short Term Goal 1 (Week 2): Pt will complete stand pivot transfer to toilet with ModA OT Short Term Goal 1 - Progress (Week 2): Not progressing OT Short Term Goal 2 (Week 2): Pt will demonstrate sustained attention for completing at least 2/3 seated grooming ADL tasks. OT Short Term Goal 2 - Progress (Week 2): Progressing toward goal OT Short Term Goal 3 (Week 2): Pt will complete LB dressing with ModA.  OT Short Term Goal 3 - Progress (Week 2): Not progressing OT Short Term Goal 4 (Week 2): Pt will demonstrate dynamic sitting balance with MinA during functional task completion.  OT Short Term Goal 4 - Progress (Week 2): Met  Skilled Therapeutic Interventions/Progress Updates:    Pt presented supine in bed, arousable for OT tx session. Pt completed bed mobility with MaxA for LE/trunk management. Pt able to maintain sitting balance with varying levels of assist, intermittently minguard assist to MaxA due to strong posterior lean. Pt agreeable to sitting up in w/c, attempted squat pivot transfer and sit<>stand via stedy with Pt unable to initiate, and intermittently physically resisting therapist attempts to transfer, with transfer ultimately unsafe to complete. Pt total A for return to supine, provided rest break, and again Pt completing supine to sit EOB with MaxA to complete UB dressing. RN present for IV management, and Pt with MaxA for maintaining dynamic sitting balance, able to initiate sequence of threading UEs and initiate pulling over head, needing physical assist to complete task. Pt returned to supine with total A and total A for donning pants at bed level with MaxA and Max verbal cues for rolling to L and R. Attempted grooming ADLs with HOB  raised to sitting, providing pt with washcloth to wash face and with Pt attempting to put in mouth, stating it was a sandwich. Max verbal cues and attempts to remove washcloth from Pt's hand. Ended session with Pt supine in bed, bed alarm activated, call bell and needs within reach.   Therapy Documentation Precautions:  Precautions Precautions: Fall, ICD/Pacemaker Restrictions Weight Bearing Restrictions: No   Pain: Pain Assessment Pain Assessment: No/denies pain Faces Pain Scale: No hurt ADL: ADL ADL Comments: see functional navigator   See Function Navigator for Current Functional Status.   Therapy/Group: Individual Therapy  Raymondo Band 11/03/2016, 3:18 PM

## 2016-11-03 NOTE — Patient Care Conference (Signed)
Inpatient RehabilitationTeam Conference and Plan of Care Update Date: 11/02/16   Time: 8:10 AM    Patient Name: [name]      Medical Record Number: [id number]  Date of Birth: Aug 22, 1930 Sex: Male         Room/Bed: 4W10C/4W10C-01 Payor Info: Payor: MEDICARE / Plan: MEDICARE PART A AND B / Product Type: *No Product type* /    Admitting Diagnosis: CVA  Admit Date/Time:  10/18/2016  4:44 PM Admission Comments: No comment available   Primary Diagnosis:  Acute encephalopathy Principal Problem: Acute encephalopathy  Patient Active Problem List   Diagnosis Date Noted  . Embolic stroke involving right cerebellar artery ([place])   . Palliative care encounter   . Goals of care, counseling/discussion   . Hypernatremia   . Acute encephalopathy 10/18/2016  . Tick bite of calf, initial encounter   . Stage 3 chronic kidney disease   . Diabetes mellitus type 2 in nonobese (HCC)   . Coronary artery disease involving native coronary artery of native heart without angina pectoris   . Dementia without behavioral disturbance   . Chronic systolic heart failure ([place])   . Debilitated 11/18/2015  . Neuropathic pain   . Tachycardia   . Pacemaker   . DM type 2 with diabetic peripheral neuropathy ([place])   . Benign essential HTN   . History of syncope   . LBBB (left bundle branch block)   . Acute blood loss anemia   . NSVT (nonsustained ventricular tachycardia) ([place])   . Bradycardia, severe sinus 11/13/2015  . Complete heart block ([place])   . Chronic combined systolic and diastolic heart failure ([place]) 04/27/2013  . Essential hypertension 04/27/2013  . Left bundle branch block 04/27/2013  . Type II or unspecified type diabetes mellitus with neurological manifestations, uncontrolled 01/15/2013    Expected Discharge Date: Expected Discharge Date: November 18, 2016  Team Members Present: Physician leading conference: Dr. Delice Lesch Social Worker Present: Lennart Pall, LCSW Nurse Present: Dorien Chihuahua, RN PT  Present: Kem Parkinson, PT OT Present: Willeen Cass, OT;Roanna Epley, COTA SLP Present: Weston Anna, SLP PPS Coordinator present : Daiva Nakayama, RN, CRRN     Current Status/Progress Goal Weekly Team Focus  Medical   End-stage dementia, dehydration, improving with IV fluids,  Maintain medical stability for home versus SNF discharge  Maintain adequate hydration,   Bowel/Bladder   incontinent of B/B; LBM 6/25  continent of B/B with mod assist during stay  assess B/B continence status q shift and prn; assess pt cognition for ability to toilet or use bedpan   Swallow/Nutrition/ Hydration   Total A to Max A with dysphagia 1 and nectar thick liquids d/t cognition  Max A - downgraded 6/18  Safety with PO intake   ADL's   max A/tota A functional transfers, max multimodal cues for task initiation and participation; max A/tot A for BADLs  downgraded to mod A/max A overall  cognitive remediation, sit<>stand, functional transfers, family education   Mobility   maxA bed mobility, max/totalA +2 sit <>stand and transfers; fluctuates with arousal  downgraded to maxA overall, stairs and gait in home environment goals d/c  activity tolerance, hands on training with daughter, education in preparation for d/c   Communication   All communicaiton goals discontinued d/t cognitive decline         Safety/Cognition/ Behavioral Observations  All cognitive goals discontinued with exception of focused attention d/t cognitive decline  Max A with focused attention of 1 minute  arousal  Pain   4 on faces scale during the late night; unable to assess pain site; pt yells out at night and when asked if he wanted tylenol he nodded his head "yes"; tylenol 650mg   <3  assess pain q shift and prn and for nonverbal signs of pain   Skin   skin intact; pink foam   skin free from infection and breakdown with max assist during stay on unit  assess skin q shift and prn    Rehab Goals Patient on target to meet rehab  goals: No *See Care Plan and progress notes for long and short-term goals.  Barriers to Discharge: Patient does not take adequate by mouth    Possible Resolutions to Barriers:  Palliative care to establish goals of care.    Discharge Planning/Teaching Needs:  Daughter still plans for pt to return home with her as primary caregiver.  Other family members in contact with staff expressing concerns about this plan and wanting SNF.  I am meeting with them all this afternoon.  Teaching underway.   Team Discussion:  All goals being downgraded to mod - max assistance.  Daughter needs much more education if she continues to plan for home d/c.  MD has made consult to palliative care for goals of care discussion with daughter.    Revisions to Treatment Plan:  Palliative care consult may lead to change in d/c plan.   Continued Need for Acute Rehabilitation Level of Care: The patient requires daily medical management by a physician with specialized training in physical medicine and rehabilitation for the following conditions: Daily direction of a multidisciplinary physical rehabilitation program to ensure safe treatment while eliciting the highest outcome that is of practical value to the patient.: Yes Daily medical management of patient stability for increased activity during participation in an intensive rehabilitation regime.: Yes Daily analysis of laboratory values and/or radiology reports with any subsequent need for medication adjustment of medical intervention for : Neurological problems;Nutritional problems  Jiovanny Burdell 11/03/2016, 3:25 PM

## 2016-11-03 NOTE — Progress Notes (Signed)
Speech Language Pathology Daily Session Note  Patient Details  Name: [name] MRN: [id number] Date of Birth: 05/29/1930  Today's Date: 11/03/2016 SLP Individual Time: 1324-4010 SLP Individual Time Calculation (min): 15 min  Short Term Goals: Week 2: SLP Short Term Goal 1 (Week 2): Pt will consume therapeutic trials of thin liquids with max assist multimodal cues for use of swallowing precautions.   SLP Short Term Goal 1 - Progress (Week 2): Discontinued (comment) (d/t overall decline in cognitive and physical state) SLP Short Term Goal 2 (Week 2): Pt will maintain alertness for 1 minute intervals with max assist multimodal cues.   SLP Short Term Goal 3 (Week 2): Pt will sustain his attention to task for 30 second intervals with max assist multimodal cues for redirection.   SLP Short Term Goal 4 (Week 2): Pt will utilize external aids to orient to place, date, and situation with max assist multimodal cues.   SLP Short Term Goal 4 - Progress (Week 2): Discontinued (comment) (d/t decline in cognitive ability) SLP Short Term Goal 5 (Week 2): Pt will consume dys 1 textures and nectar liquids with Max assist verbal cues for use of swallowing precautions and minimal overt s/s of aspiration   Skilled Therapeutic Interventions:  Pt was seen briefly for skilled ST.  Pt received at nursing station, sleepy but arousable to voice for brief intervals.  Therapist attempted teaspoons of cold boluses of nectar thick liquids at the lips to maximize potential for alertness.  Pt briefly moved his lips around spoon and licked his lips but did not initiate a swallow due to lethargy.  No further POs were attempted today for pt safety.  Per report, pt is now awaiting placement with hospice facility.  Pt returned to room and left with family at bedside.  Continue per current plan of care.    Function:  Eating Eating                 Cognition Comprehension Comprehension assist level: Understands basic  less than 25% of the time/ requires cueing >75% of the time  Expression   Expression assist level: Expresses basis less than 25% of the time/requires cueing >75% of the time.  Social Interaction Social Interaction assist level: Interacts appropriately less than 25% of the time. May be withdrawn or combative.  Problem Solving Problem solving assist level: Solves basic less than 25% of the time - needs direction nearly all the time or does not effectively solve problems and may need a restraint for safety  Memory Memory assist level: Recognizes or recalls less than 25% of the time/requires cueing greater than 75% of the time    Pain Pain Assessment Pain Assessment: No/denies pain  Therapy/Group: Individual Therapy  Liza Czerwinski, Selinda Orion 11/03/2016, 2:54 PM

## 2016-11-03 NOTE — Plan of Care (Signed)
Problem: RH BOWEL ELIMINATION Goal: RH STG MANAGE BOWEL WITH ASSISTANCE STG Manage Bowel with mod Assistance.  Outcome: Progressing Patient is total care, no bowel movement this shift  Problem: RH BLADDER ELIMINATION Goal: RH STG MANAGE BLADDER WITH ASSISTANCE STG Manage Bladder With mod Assistance  Outcome: Not Progressing Unable to urinate, in and out catehterized with 300 ml of urine   Problem: RH SKIN INTEGRITY Goal: RH STG SKIN FREE OF INFECTION/BREAKDOWN Patients skin will remain free from further breakdown or infection with mod assist.  Outcome: Progressing No skin uissues noted, foam dry and intact to sacrum to prevent skin breakdown, patient is turned and repositioned q2hrs  Problem: RH SAFETY Goal: RH STG ADHERE TO SAFETY PRECAUTIONS W/ASSISTANCE/DEVICE STG Adhere to Safety Precautions With mod Assistance/Device.  Outcome: Progressing Patient is on low bed, bed alarm with side rails up x3  Problem: RH COGNITION-NURSING Goal: RH STG USES MEMORY AIDS/STRATEGIES W/ASSIST TO PROBLEM SOLVE STG Uses Memory Aids/Strategies With mod Assistance to Problem Solve.  Outcome: Not Progressing Patient is very confused

## 2016-11-03 NOTE — Progress Notes (Signed)
Physical Therapy Note  Patient Details  Name: [name] MRN: [id number] Date of Birth: 1930/12/25 Today's Date: 11/03/2016    Pt's plan of care adjusted to Q.D. after speaking with care team and discussed with MD in team conference as pt currently unable to tolerate current therapy schedule with OT, PT, and SLP.     [name] 11/03/2016, 3:21 PM

## 2016-11-03 NOTE — Progress Notes (Signed)
Social Work Patient ID: [name], male   DOB: 1931-03-21, 83 y.o.   MRN: [id number]   Met with pt's daughter and her friend on Monday afternoon to discuss d/c plans.  Patrick [place], PA also present to review medical issues including very poor po and fluid intake.  Pt's daughter listening to all concerns expressed about his significant care needs and recommendation for Palliative Care involvement as he is likely at "end of life" stage due to dementia.  Daughter states that she still wants to take her father home as she had "promised him I would never put him in a nursing home."  At the end of our discussion on Monday, it was planned that daughter would be in on Tuesday for hands on training to prepare for home d/c.  I was contacted by pt's niece, [name], on Tuesday morning expressing much concern about daughter's abilities to provide adequate care for pt in the home.  I explained to Ms. [name] that daughter is pt's legal next of kin and the decision maker for her father.  Ms. Allean Found made several statements about daughter's daily lifestyle, however, I had to continue to reiterate that we will have daughter go through the family training and, if she is able to complete this and show proficiency in caring for her father, then we cannot stop her from taking her father home.  Of note, both daughter and niece confirm there is no POA, HCPOA or Living will for this patient.  Daughter did begin training with therapies on Tuesday afternoon and niece arrived on site when this was being concluded and asked that I meet with the two of them together.  Again, [name] was also present during this meeting which became very heated at times between the daughter and niece.  The PA and I attempted to calm the room and encourage the two of them to await input from Palliative Care team.  Both were agreeable with this plan. Daughter continued to be completely against SNF placement.  Palliative Care able to meet with daughter  today [name] APPRECIATED - with the outcome being that daughter willing to consider [facility] as it is not SNF.  Please see palliative care note for further information.  At this time, daughter to meet with Hospice liaison to further plan for transition to [facility].  Will keep team informed of timing of d/c.  Plan to change pt's tx schedule to qd.   Jordann Grime, LCSW

## 2016-11-03 NOTE — Progress Notes (Signed)
Physical Therapy Session Note  Patient Details  Name: [name] MRN: [id number] Date of Birth: 06/10/1930  Today's Date: 11/03/2016 PT Individual Time: 1345-1430 PT Individual Time Calculation (min): 45 min   Short Term Goals: Week 3:  PT Short Term Goal 1 (Week 3): =LTG downgraded to maxA overall  Skilled Therapeutic Interventions/Progress Updates: Pt received seated in w/c with daughter and brother present for family education; pt alert and responsive to stimuli, however mostly non-verbal and disoriented. Performed stand pivot transfer w/c >car with RW and daughter and therapist providing max/totalA +3 for safety. Pt unable to coordinate use of RW, pivotal steps, attend to task, and became rigid in extension pushing the back of his head on the rail when attempting to have him sit in car. Pt's daughter called away to attend palliative care consult. MaxA +2 squat pivot to return to w/c. Sit <>stand at sink modA; maxA to maintain standing balance while second therapist performing hygiene and brief change. Remained seated in w/c at end of session, quick release belt intact and handoff to SLP for next session.      Therapy Documentation Precautions:  Precautions Precautions: Fall, ICD/Pacemaker Restrictions Weight Bearing Restrictions: No Pain: Pain Assessment Pain Assessment: No/denies pain  See Function Navigator for Current Functional Status.   Therapy/Group: Individual Therapy  Luberta Mutter 11/03/2016, 2:59 PM

## 2016-11-03 NOTE — Progress Notes (Signed)
Occupational Therapy Note  Patient Details  Name: [name] MRN: [id number] Date of Birth: 06-19-1930  Today's Date: 11/03/2016 OT Individual Time: 1300-1345 OT Individual Time Calculation (min): 45 min   Pt with no s/s of pain Individual Therapy  Pt resting in bed upon arrival and greeted pt by name after introduction.  Pt required tot A +2 for sit<>stand from EOB with St[place].  Pt required max A to maintain sitting balance in Stedy in preparation for sitting in w/c.  Attempted to engage pt in grooming tasks at sink.  Pt required tot A for all grooming tasks at sink.  Pt's daughter arrived with 10 mins remaining in session.  Pt remained in w/c with QRB in place and family present.    [name] [facility] 11/03/2016, 3:09 PM

## 2016-11-03 NOTE — Plan of Care (Signed)
Problem: RH BLADDER ELIMINATION Goal: RH STG MANAGE BLADDER WITH ASSISTANCE STG Manage Bladder With mod Assistance  Outcome: Not Progressing Total assist- in and out cath

## 2016-11-03 NOTE — Progress Notes (Signed)
[facility]  We were consulted for hypernatremia which is secondary to poor oral intake. Note that patient is being transitioned to Hospice care. [facility] will sign off. Please call us back if we are needed.   Debbe Odea, MD

## 2016-11-03 NOTE — Plan of Care (Signed)
Problem: RH BOWEL ELIMINATION Goal: RH STG MANAGE BOWEL WITH ASSISTANCE STG Manage Bowel with mod Assistance.  Outcome: Not Progressing Total assist- no bm this shift

## 2016-11-03 NOTE — Progress Notes (Signed)
Occupational Therapy Weekly Progress Note  Patient Details  Name: [name] MRN: [id number] Date of Birth: 1930-10-23  Beginning of progress report period: October 26, 2016 End of progress report period: November 03, 2016  Patient has met 1 of 4 short term goals.  Pt made minimal progress with BADLs during the past week.  Pt continues to require max multimodal cues to initiate transitional movements and self care tasks.  Pt inconsistently responds to verbal cues and requires max multimodal cues to keep his eyes open during functional tasks.  Pt requires max A/tot A +2 for functional transfers.  A manual hoyer lift has been introduced for use with family.  Pt is incontinent of bowel and bladder and requires tot A for toileting tasks.  Patient continues to demonstrate the following deficits: muscle weakness, decreased cardiorespiratoy enduranceimpaired timing and sequencing, abnormal tone, unbalanced muscle activation, motor apraxia, decreased coordination and decreased motor planning, decreased visual acuity, decreased visual perceptual skills and decreased visual motor skills, decreased midline orientation, decreased motor planning and ideational apraxia, decreased initiation, decreased attention, decreased awareness, decreased problem solving, decreased safety awareness, decreased memory and delayed processing and decreased sitting balance, decreased standing balance, decreased postural control, hemiplegia, decreased balance strategies and difficulty maintaining precautions and therefore will continue to benefit from skilled OT intervention to enhance overall performance with BADL and Reduce care partner burden.  Patient progressing toward long term goals..  Continue plan of care.  OT Short Term Goals Week 2:  OT Short Term Goal 1 (Week 2): Pt will complete stand pivot transfer to toilet with ModA OT Short Term Goal 1 - Progress (Week 2): Not progressing OT Short Term Goal 2 (Week 2): Pt will  demonstrate sustained attention for completing at least 2/3 seated grooming ADL tasks. OT Short Term Goal 2 - Progress (Week 2): Progressing toward goal OT Short Term Goal 3 (Week 2): Pt will complete LB dressing with ModA.  OT Short Term Goal 3 - Progress (Week 2): Not progressing OT Short Term Goal 4 (Week 2): Pt will demonstrate dynamic sitting balance with MinA during functional task completion.  OT Short Term Goal 4 - Progress (Week 2): Met Week 3:  OT Short Term Goal 1 (Week 3): STG=LTG secondary to ELOS      Therapy Documentation Precautions:  Precautions Precautions: Fall, ICD/Pacemaker Restrictions Weight Bearing Restrictions: No    See Function Navigator for Current Functional Status.     [name] [facility] 11/03/2016, 11:43 AM

## 2016-11-04 ENCOUNTER — Inpatient Hospital Stay (HOSPITAL_COMMUNITY): Payer: Medicare Other

## 2016-11-04 ENCOUNTER — Inpatient Hospital Stay (HOSPITAL_COMMUNITY): Payer: Medicare Other | Admitting: Speech Pathology

## 2016-11-04 ENCOUNTER — Inpatient Hospital Stay (HOSPITAL_COMMUNITY): Payer: Medicare Other | Admitting: Physical Therapy

## 2016-11-04 ENCOUNTER — Encounter (HOSPITAL_COMMUNITY): Payer: Self-pay

## 2016-11-04 DIAGNOSIS — Z66 Do not resuscitate: Secondary | ICD-10-CM

## 2016-11-04 DIAGNOSIS — I63441 Cerebral infarction due to embolism of right cerebellar artery: Secondary | ICD-10-CM

## 2016-11-04 LAB — GLUCOSE, CAPILLARY
GLUCOSE-CAPILLARY: 123 mg/dL — AB (ref 65–99)
Glucose-Capillary: 134 mg/dL — ABNORMAL HIGH (ref 65–99)

## 2016-11-04 MED ORDER — INSULIN GLARGINE 100 UNIT/ML ~~LOC~~ SOLN: 10.0000 [IU] | Freq: Every day | SUBCUTANEOUS | 11 refills | Status: AC

## 2016-11-04 NOTE — Plan of Care (Signed)
Problem: RH BOWEL ELIMINATION Goal: RH STG MANAGE BOWEL WITH ASSISTANCE STG Manage Bowel with mod Assistance.  Outcome: Not Progressing Total care  Problem: RH BLADDER ELIMINATION Goal: RH STG MANAGE BLADDER WITH ASSISTANCE STG Manage Bladder With mod Assistance  Outcome: Not Progressing Unable to urinate, straight cateh every 8 hours, tolerates well  Problem: RH SKIN INTEGRITY Goal: RH STG SKIN FREE OF INFECTION/BREAKDOWN Patients skin will remain free from further breakdown or infection with mod assist.  Outcome: Progressing No skin issues noted  Problem: RH SAFETY Goal: RH STG ADHERE TO SAFETY PRECAUTIONS W/ASSISTANCE/DEVICE STG Adhere to Safety Precautions With mod Assistance/Device.  Outcome: Progressing On low bed, safety precautions maintained

## 2016-11-04 NOTE — Progress Notes (Signed)
Social Work  Discharge Note  The overall goal for the admission was met for:   Discharge location: No - initial plan for pt to d/c home with daughter providing 24/7 assist.  Pt unable to make progress and determined "end of life" by MD.  Plan changed to [facility] d/c.  Length of Stay: Yes - 17 days  Discharge activity level: No - ranging mod to total assist  Home/community participation: No  Services provided included: MD, RD, PT, OT, SLP, RN, TR, Pharmacy and SW  Financial Services: Medicare and Private Insurance: [place]  Follow-up services arranged: Other: Naval [employer]  Comments (or additional information):  Patient/Family verbalized understanding of follow-up arrangements: Yes  Individual responsible for coordination of the follow-up plan: daughter  Confirmed correct DME delivered: NA  Marticia Reifschneider

## 2016-11-04 NOTE — Discharge Summary (Signed)
Blevins, Patrick                 ACCOUNT NO.:  [id number]  MEDICAL RECORD NO.:  [id number]  LOCATION:                                 FACILITY:  PHYSICIAN:  [name], P.A.  DATE OF BIRTH:  26-Nov-1930  DATE OF ADMISSION:  10/18/2016 DATE OF DISCHARGE:  11/04/2016                              DISCHARGE SUMMARY   DISCHARGE DIAGNOSES: 1. 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 2. Acute encephalopathy and failure to thrive. 3. Subcutaneous Lovenox for deep vein thrombosis prophylaxis. 4. Decreased nutritional storage. 5. Diabetes mellitus. 6. Chronic kidney disease stage 3. 7. Chronic systolic congestive heart failure. 8. History of hypertension. 9. Hyponatremia.  HISTORY OF PRESENT ILLNESS:  This is an 83 year old right-handed male with history of chronic systolic congestive heart failure, CKD stage 3, diabetes mellitus, CAD with pacemaker, underlying unspecified dementia, seen by Dr. Hall Blevins of [facility] the past.  The patient did receive inpatient rehab services Blevins July, 2017 for debilitation related to complete heart block that did require pacemaker.  Presented October 14, 2016, with altered mental status reported by daughter, generalized decline over the past few months, decreased appetite, also noted was a deer tick found on the patient's left thigh outer medial aspect. Cranial CT scan showed acute right cerebellar infarct.  Per report, acute infarct recently Blevins the distribution of a portion of the right posterior inferior cerebellar artery.  There was atrophy with extensive supratentorial small vessel disease.  Chest x-ray with basilar atelectasis.  RMSF serology negative, maintained on doxycycline.  Urine negative, urine drug screen negative, troponin 0.16.  CTA angiogram of head and neck showed no emergent large vessel occlusion.  Echocardiogram with ejection fraction of 35%, no wall  motion abnormalities, maintained on aspirin for CVA prophylaxis.  The patient was admitted for a comprehensive rehab program.  PAST MEDICAL HISTORY:  See discharge diagnoses.  SOCIAL HISTORY:  Lives with daughter.  Noted generalized decline over the past few months.  Decreased appetite.  FUNCTIONAL STATUS:  Upon admission to [place] was max assist sit to stand; max assist supine to sit, max total assist activities of daily living.  PHYSICAL EXAMINATION:  VITAL SIGNS:  Blood pressure 127/54, pulse 75, temperature 97, respirations 16. GENERAL:  This was an alert male. HEENT:  Pupils round reactive to light. NECK:  Supple.  Nontender.  No JVD. CARDIAC:  Rate controlled. ABDOMEN:  Soft, nontender.  Good bowel sounds. RESPIRATORY:  Limited expiratory effort.  Clear to auscultation. NEUROLOGIC:  He was pleasantly confused, easily distracted.  REHABILITATION HOSPITAL COURSE:  The patient was admitted to [facility]patient [facility]hab [facility] with therapies initiated on a 3-hour daily basis, consisting of physical therapy, occupational therapy, speech therapy, and rehabilitation nursing.  The following issues were addressed during the patient's rehabilitation stay.  Pertaining to [name] recent CVA, he continued on low-dose aspirin therapy, follow up per Neurology Services.  Subcutaneous Lovenox for DVT prophylaxis.  No bleeding episodes.  He did have a history of diabetes mellitus, hemoglobin A1c 7.5 on low-dose insulin, adjusted accordingly due to decreased nutritional storage.  CKD stage 3, close  monitoring of renal function. He exhibited no other signs of fluid overload.  Blood pressures overall controlled on no current antihypertensive medications.  He was tolerating a dysphagia #1 nectar thick liquid diet; however, limited p.o. intake.  The patient's underlying CVA, underlying dementia discussed at length with daughter and family acute encephalopathy. Palliative care as well as  Medicine team consulted.  The patient was treated for some hyponatremia related to decreased nutritional storage. The patient attending therapies with very minimal limited gains due to underlying dementia, CVA.  Palliative Care consulted for goals of care. Initially, daughter had requested strong feelings on the patient being discharged to home; however, after long discussion, she did not feel she could provide the necessary care and safety for the patient.  Plan was for discharge to [facility] and all issues Blevins regard to this discussed at length with family.  DISCHARGE MEDICATIONS: 1. Aspirin 81 mg p.o. daily. 2. Lantus insulin 10 units subcutaneous daily. 3. Tylenol as needed.  DIET:  Dysphagia #1 nectar thick liquid diet.  The family had refused any other means of nutritional support Blevins the way of nasogastric tube and the patient was not a candidate for gastrostomy PEG tube.  Discharge took place November 05, 2016.     [name], P.A.     DA/MEDQ  D:  11/04/2016  T:  11/04/2016  Job:  [id number]  cc:   [name], M.D. [name], M.D.

## 2016-11-04 NOTE — Progress Notes (Signed)
Speech Language Pathology Note  Patient Details  Name: [name] MRN: [id number] Date of Birth: 05-15-1930 Today's Date: 11/04/2016  Pt with decline in medical condition and is discharging to Hospice facility today. All education completed and all of daughter's questions answered to satisfaction.  Haakon Titsworth B. Rutherford Nail, M.S., CCC-SLP Speech-Language Pathologist    Darrill Vreeland Rutherford Nail 11/04/2016, 1:10 PM

## 2016-11-04 NOTE — Progress Notes (Signed)
Received request from [place], [place], for family interest in [place] with request for transfer today.  Chart reviewed.  Met with Linette, daughter to confirm interest and explain services.   Family agreeable to transfer today.  [place], [place], aware.  Registration paperwork completed today.  Dr. Orpah Melter to assume care per family request.  Please fax discharge summary to [phone].  RN, please call report to [phone].  Please arrange transport for patient to arrive as soon as possible.    Thank you for this referral,  Edyth Gunnels, RN, [facility] Liaison [phone]  All hospital liaisons are now on [place].

## 2016-11-04 NOTE — Plan of Care (Signed)
Problem: RH BLADDER ELIMINATION Goal: RH STG MANAGE BLADDER WITH ASSISTANCE STG Manage Bladder With mod Assistance  Outcome: Not Progressing Total assist, incontinent

## 2016-11-04 NOTE — Progress Notes (Signed)
Physical Therapy Session Note  Patient Details  Name: [name] MRN: [id number] Date of Birth: [birth year]  Today's Date: 11/04/2016 PT Individual Time: 1400-1413 PT Individual Time Calculation (min): 13 min   Short Term Goals: Week 3:  PT Short Term Goal 1 (Week 3): =LTG downgraded to maxA overall  Skilled Therapeutic Interventions/Progress Updates: Pt received supine in bed with family present; pt awake and alert throughout session. Supine>sit modA with HOB elevated and BUE support from therapist. Sitting balance on EOB variable S>modA from posterior lean. Sit >stand x1 trial with modA; standing tolerance with posterior lean approximately 1 min before pt requesting to sit. Much improved participation in session, appropriate verbal interaction with therapist. Daughter reports "he thinks he's going home so he's trying really hard". Transport arrived to transfer pt to next venue of care; returned pt to supine totalA. Remained supine in bed at end of session, family present and all needs in reach.      Therapy Documentation Precautions:  Precautions Precautions: Fall, ICD/Pacemaker Restrictions Weight Bearing Restrictions: No General: PT Amount of Missed Time (min): 17 Minutes PT Missed Treatment Reason: Unavailable (Comment) (transport arrived to d/c pt)   See Function Navigator for Current Functional Status.   Therapy/Group: Individual Therapy  Luberta Mutter 11/04/2016, 2:16 PM

## 2016-11-04 NOTE — Discharge Summary (Signed)
Discharge summary job [id number]

## 2016-11-04 NOTE — Progress Notes (Signed)
Occupational Therapy Discharge Summary  Patient Details  Name: [name] MRN: [id number] Date of Birth: 10-Dec-1930   Patient has met 3 of 8 long term goals due to improved balance.  Pt progress was minimal and inconsistent during this admission.  Pt continues to require max multimodal cues to initiate tasks and attend to tasks.  Pt frequently physically resisted attempts to engage pt in functional tasks. Pt refusing to participate in self feeding tasks or eating.  Pt frequently refuses to participate in BADLs.  Pt discharging to [facility] (residential hospice services). Patient to discharge at overall Total Assist level.  Patient's care partner unavailable to provide the necessary physical and cognitive assistance at discharge.    Reasons goals not met: Pt d/c to hospice care at this time. Pt requires total A for all basic ADLs at this time due to physical and cognive deficits.   Recommendation:  Patient will benefit from ongoing skilled OT services in home health setting to continue to advance functional skills in the area of Reduce care partner burden.  Equipment: None-pt discharge to hospice  Reasons for discharge: lack of progress toward goals and discharge from hospital  Patient/family agrees with progress made and goals achieved: Yes          See Function Navigator for Current Functional Status.  [name] [facility] 11/04/2016, 3:43 PM

## 2016-11-04 NOTE — Progress Notes (Signed)
Occupational Therapy Session Note  Patient Details  Name: [name] MRN: [id number] Date of Birth: 04-07-1931  Today's Date: 11/04/2016 OT Individual Time: 1130-1155 OT Individual Time Calculation (min): 25 min    Short Term Goals: Week 3:  OT Short Term Goal 1 (Week 3): STG=LTG secondary to ELOS  Skilled Therapeutic Interventions/Progress Updates:    Pt resting in bed upon arrival. Attempted to engage pt in rolling in bed to assist with changing clothing.  Pt physically resisted, resisting all movements. Pt's LUE/hand grasped L bed rail and required tot A to remove hand.  Pt conversant throughout session on variety of unrelated topics.  Unable to orient.  Pt remained in bed with bed alarm activated and call bell at side.   Therapy Documentation Precautions:  Precautions Precautions: Fall, ICD/Pacemaker Restrictions Weight Bearing Restrictions: No   Pain: Pain Assessment Pain Assessment: No/denies pain  See Function Navigator for Current Functional Status.   Therapy/Group: Individual Therapy  Leroy Libman 11/04/2016, 12:08 PM

## 2016-11-04 NOTE — Plan of Care (Signed)
Problem: RH Grooming Goal: LTG Patient will perform grooming w/assist,cues/equip (OT) LTG: Patient will perform grooming with assist, with/without cues using equipment (OT)  Downgraded due to decr cognition requiring more A  Problem: RH Dressing Goal: LTG Patient will perform upper body dressing (OT) LTG Patient will perform upper body dressing with assist, with/without cues (OT).  Downgraded due to decr cognition requiring more A Goal: LTG Patient will perform lower body dressing w/assist (OT) LTG: Patient will perform lower body dressing with assist, with/without cues in positioning using equipment (OT)  Total A due to Downgraded due to decr cognition requiring more A  Problem: RH Toileting Goal: LTG Patient will perform toileting w/assist, cues/equip (OT) LTG: Patient will perform toiletiing (clothes management/hygiene) with assist, with/without cues using equipment (OT)  Outcome: Not Applicable Date Met: 00/71/21 Total A d/c goal pt requires total A  Problem: RH Tub/Shower Transfers Goal: LTG Patient will perform tub/shower transfers w/assist (OT) LTG: Patient will perform tub/shower transfers with assist, with/without cues using equipment (OT)  Outcome: Not Applicable Date Met: 97/58/83 D/c goal at this time- showering not recommended

## 2016-11-04 NOTE — Progress Notes (Signed)
[facility]     PROGRESS NOTE    Subjective/Complaints:  Appreciate palliative care consult, patient receiving IV fluids for hyper-natremia Patient is awake, interactive, states he had barbecue. This morning for breakfast, which is unlikely, patient states he is in [place], but not at a hospital, even though we discussed he is at [place]: Limited due to cognitive/behavioral    Objective: Vital Signs: Blood pressure 126/89, pulse 76, temperature 97.6 F (36.4 C), temperature source Oral, resp. rate 18, height 5\' 10"  (1.778 m), weight 75.3 kg (166 lb), SpO2 100 %. No results found. No results for input(s): WBC, HGB, HCT, PLT in the last 72 hours.  Recent Labs  11/01/16 2239 11/02/16 0902 11/03/16 0932  NA 151* 152* 147*  K 4.5  --  3.8  CL 122*  --  117*  GLUCOSE 154*  --  140*  BUN 26*  --  22*  CREATININE 1.34*  --  1.34*  CALCIUM 9.1  --  8.9   CBG (last 3)   Recent Labs  11/03/16 1623 11/03/16 2117 11/04/16 0646  GLUCAP 88 92 134*    Wt Readings from Last 3 Encounters:  11/03/16 75.3 kg (166 lb)  10/16/16 75 kg (165 lb 5.5 oz)  09/03/16 79.4 kg (175 lb)    Physical Exam:  Constitutional: no distress HENT:  Head: Normocephalic and atraumatic.  Eyes: EOMI  Neck: Normal range of motion. Neck supple. No thyromegaly present.  Cardiovascular: RRR without murmur. No JVD    Respiratory: CTA Bilaterally without wheezes or rales. Normal effort   GI: Soft. Bowel sounds are normal. He exhibits no distension.  Musculoskeletal: He exhibits no edema or tenderness.  Neurological:  Awake and oriented to self only.  Motor: 4-4+/5 through all 4's.  DTRs 3+ RUE/RLE.   Skin: Skin is warm and dry.  Psychiatric: Does not answer orientation questions, is alert today, however, and conversant   Assessment/Plan: 1. Functional and cognitive deficits secondary to encephalopathy and recent right PICA infarct which require 3+ hours  per day of interdisciplinary therapy in a comprehensive inpatient rehab setting. Physiatrist is providing close team supervision and 24 hour management of active medical problems listed below. Physiatrist and rehab team continue to assess barriers to discharge/monitor patient progress toward functional and medical goals.  Function:  Bathing Bathing position Bathing activity did not occur: N/A (night bath) Position: Sitting EOB  Bathing parts Body parts bathed by patient: Right arm, Left arm, Chest, Abdomen, Front perineal area Body parts bathed by helper: Buttocks, Right upper leg, Left upper leg, Right lower leg, Left lower leg  Bathing assist Assist Level: Touching or steadying assistance(Pt > 75%)      Upper Body Dressing/Undressing Upper body dressing   What is the patient wearing?: Pull over shirt/dress     Pull over shirt/dress - Perfomed by patient: Put head through opening Pull over shirt/dress - Perfomed by helper: Thread/unthread right sleeve, Thread/unthread left sleeve, Pull shirt over trunk (pt performing 50% of threading sleeves, though requires assist )        Upper body assist Assist Level:  (MaxA )      Lower Body Dressing/Undressing Lower body dressing   What is the patient wearing?: Pants   Underwear - Performed by helper: Thread/unthread right underwear leg, Thread/unthread left underwear leg, Pull underwear up/down Pants- Performed by patient: Pull pants up/down Pants- Performed by helper: Thread/unthread right pants leg, Thread/unthread left pants leg, Pull pants up/down  Non-skid slipper socks- Performed by helper: Don/doff right sock, Don/doff left sock                  Lower body assist Assist for lower body dressing:  (total assist )      Toileting Toileting Toileting activity did not occur: No continent bowel/bladder event   Toileting steps completed by helper: Adjust clothing prior to toileting, Performs perineal hygiene, Adjust clothing  after toileting Toileting Assistive Devices: Other (comment) (BUE on sink)  Toileting assist Assist level: Touching or steadying assistance (Pt.75%)   Transfers Chair/bed transfer   Chair/bed transfer method: Other Chair/bed transfer assist level: Total assist (Pt < 25%) Chair/bed transfer assistive device: Other (manual hoyer) Mechanical lift: Stedy   Locomotion Ambulation Ambulation activity did not occur: Safety/medical concerns   Max distance: 57ft Assist level: Moderate assist (Pt 50 - 74%)   Wheelchair   Type: Manual Max wheelchair distance: 75 Assist Level: Touching or steadying assistance (Pt > 75%)  Cognition Comprehension Comprehension assist level: Understands basic less than 25% of the time/ requires cueing >75% of the time  Expression Expression assist level: Expresses basis less than 25% of the time/requires cueing >75% of the time.  Social Interaction Social Interaction assist level: Interacts appropriately less than 25% of the time. May be withdrawn or combative.  Problem Solving Problem solving assist level: Solves basic less than 25% of the time - needs direction nearly all the time or does not effectively solve problems and may need a restraint for safety  Memory Memory assist level: Recognizes or recalls less than 25% of the time/requires cueing greater than 75% of the time   Medical Problem List and Plan: 1.  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   -May discontinue CIR PT, OT, speech  -Family is still thinking about taking patient home, agree with palliative careWill be hospice care, inpatient, as discharge plan, awaiting bed  2.  DVT Prophylaxis/Anticoagulation: Subcutaneous Lovenox.   3. Pain Management: Tylenol as needed 4. Mood: Provide emotional support 5. Neuropsych: This patient Is not capable of making  decisions on his own behalf. 6. Skin/Wound Care: Routine skin checks 7. Fluids/Electrolytes/Nutrition:   -potassium normalized -continue to encourage po.on D1/Nectar diet still   - 8. Diabetes mellitus with peripheral neuropathy. Hemoglobin A1c 7.5.    -fair control but intake very inconsistent  -decreased lantus insulin  to 10 units  qam last week---continue at current dose    -discontinue metformin done on 6/23    9. CKD stage III. Stable but at risk for worsening with intake issues10. CAD with pacemaker. Continue aspirin. No chest pain or shortness of breath 11. Chronic systolic congestive heart failure. Monitor for any signs of fluid overload   -Discontinue daily weights 12. History of Hypertension. Controlled off antihypertensive medications.   - Vitals:   11/03/16 1221 11/04/16 0516  BP: 127/75 126/89  Pulse: 75 76  Resp: 20 18  Temp: 97.5 F (36.4 C) 97.6 F (36.4 C)      LOS (Days) 17 A FACE TO FACE EVALUATION WAS PERFORMED  Charlett Blake, MD 11/04/2016 10:48 AM

## 2016-11-04 NOTE — Progress Notes (Signed)
Daily Progress Note   Patient Name: [name]       Date: 11/04/2016 DOB: 09/20/1930  Age: 83 y.o. MRN#: [id number] Attending Physician: No att. providers found Primary Care Physician: Elayne Snare, MD Admit Date: 10/18/2016  Reason for Consultation/Follow-up: Establishing goals of care  Subjective: Checking on the patient and his daughter Willette Cluster) after an extensive [place] conversation yesterday.  Mr. Nelles is getting ready to leave for [employer].  This is a relief to [place].  The patient wakes and tells me his head is hurting - then he falls back to sleep.   Assessment: Lethargic.  Head ache.     Patient Profile/HPI: 83 y.o. male  with past medical history of a neuromuscular disorder, CHF (EF 30-35%) CKD 3, prostate cancer, complete heart block s/p pacemaker and AICD placement who was admitted on 10/18/2016 for rehabilitation after an acute right cerebellar stroke.  In CIR his PO intake has declined and his sodium has risen to over 150.  He is not progressing.     Length of Stay: 17  Current Medications: Scheduled Meds:  . aspirin EC  81 mg Oral Daily  . enoxaparin (LOVENOX) injection  40 mg Subcutaneous Q24H  . insulin aspart  0-9 Units Subcutaneous TID WC  . insulin glargine  10 Units Subcutaneous Daily  . mouth rinse  15 mL Mouth Rinse BID    Continuous Infusions: . dextrose 25 mL/hr at 11/03/16 2048    PRN Meds: acetaminophen **OR** acetaminophen (TYLENOL) oral liquid 160 mg/5 mL **OR** acetaminophen, bisacodyl, ondansetron **OR** ondansetron (ZOFRAN) IV, sorbitol, traZODone  Physical Exam        Well developed elderly demented gentleman.  Lethargic CV tachy resp no distress Ext no swelling.  Vital Signs: BP 97/83 (BP Location: Right Leg)   Pulse (!) 117    Temp 97 F (36.1 C) (Oral)   Resp 18   Ht 5\' 10"  (1.778 m)   Wt 75.3 kg (166 lb)   SpO2 (!) 86%   BMI 23.82 kg/m  SpO2: SpO2: (!) 86 % O2 Device: O2 Device: Nasal Cannula O2 Flow Rate:    Intake/output summary:   Intake/Output Summary (Last 24 hours) at 11/04/16 1557 Last data filed at 11/04/16 1100  Gross per 24 hour  Intake  795 ml  Output              525 ml  Net              270 ml   LBM: Last BM Date: 11/04/16 Baseline Weight: Weight: 79.3 kg (174 lb 12.8 oz) Most recent weight: Weight: 75.3 kg (166 lb)       Palliative Assessment/Data:    Flowsheet Rows     Most Recent Value  Intake Tab  Unit at Time of Referral  Other (Comment)  Palliative Care Primary Diagnosis  Neurology  Date Notified  11/02/16  Palliative Care Type  New Palliative care  Reason for referral  Clarify Goals of Care  Date of Admission  10/18/16  Date first seen by Palliative Care  11/02/16  # of days Palliative referral response time  0 Day(s)  # of days IP prior to Palliative referral  15  Clinical Assessment  Palliative Performance Scale Score  20%  Psychosocial & Spiritual Assessment  Palliative Care Outcomes  Patient/Family meeting held?  Yes  Who was at the meeting?  patient's daughter lynette, brother Josph Macho and family friend Dominica  Palliative Care Outcomes  Changed CPR status, Changed to focus on comfort, Counseled regarding hospice  Actual Discharge Date  11/04/16 [hospice facility - [facility]      Patient Active Problem List   Diagnosis Date Noted  . DNR (do not resuscitate)   . Embolic stroke involving right cerebellar artery ([place])   . Palliative care encounter   . Goals of care, counseling/discussion   . Hypernatremia   . Acute encephalopathy 10/18/2016  . Tick bite of calf, initial encounter   . Stage 3 chronic kidney disease   . Diabetes mellitus type 2 in nonobese ([facility])   . Coronary artery disease involving native coronary artery of native heart  without angina pectoris   . Dementia without behavioral disturbance   . Chronic systolic heart failure ([place])   . Debilitated 11/18/2015  . Neuropathic pain   . Tachycardia   . Pacemaker   . DM type 2 with diabetic peripheral neuropathy ([place])   . Benign essential HTN   . History of syncope   . LBBB (left bundle branch block)   . Acute blood loss anemia   . NSVT (nonsustained ventricular tachycardia) ([place])   . Bradycardia, severe sinus 11/13/2015  . Complete heart block ([place])   . Chronic combined systolic and diastolic heart failure ([place]) 04/27/2013  . Essential hypertension 04/27/2013  . Left bundle branch block 04/27/2013  . Type II or unspecified type diabetes mellitus with neurological manifestations, uncontrolled 01/15/2013    Palliative Care Plan    Recommendations/Plan:  Tylenol for HA  D/C to BP today.   Goals of Care and Additional Recommendations:  Limitations on Scope of Treatment: Full Comfort Care  Code Status:  DNR  Prognosis:   < 2 weeks CVA with advanced dementia.  No longer eating or drinking.   Discharge Planning:  Hospice facility  Care plan was discussed with family and bedside RN and CSW  Thank you for allowing the Palliative Medicine Team to assist in the care of this patient.  Total time spent:  15 min     Greater than 50%  of this time was spent counseling and coordinating care related to the above assessment and plan.  Imogene Burn, PA-C Palliative Medicine  Please contact Palliative MedicineTeam phone at [phone] for questions and concerns between 7 am - 7 pm.  Please see AMION for individual provider pager numbers.

## 2016-11-04 NOTE — Plan of Care (Signed)
Problem: RH BOWEL ELIMINATION Goal: RH STG MANAGE BOWEL WITH ASSISTANCE STG Manage Bowel with mod Assistance.  Total care

## 2016-11-04 NOTE — Progress Notes (Signed)
Physical Therapy Discharge Summary  Patient Details  Name: [name] MRN: [id number] Date of Birth: May 02, 1931  Today's Date: 11/04/2016   Patient has met 2/8 long term goals due to functional decline associated with recent CVA with underlying dementia and failure to thrive. Patient with variable levels of participation in therapy depending on arousal, attention, and fluctuates from min>max/totalA. Patient to discharge at a wheelchair level Total Assist.   Patient's care partner unable to provide the necessary physical and cognitive assistance at discharge; pt to discharge to hospice care for continued follow up.  Reasons goals not met: patient demonstrated functional decline over the course of IP rehab admission, associated with decreased cognitive status and participation in therapy  Recommendation:  Patient will benefit from hospice/palliative care services at d/c for comfort.  Equipment: to be provided at next venue of care  Reasons for discharge: lack of progress toward goals and functional decline with d/c to hospice care  Patient/family agrees with progress made and goals achieved: Yes    See Function Navigator for Current Functional Status.  Benjiman Core Tygielski 11/04/2016, 3:29 PM

## 2016-11-23 ENCOUNTER — Telehealth: Payer: Self-pay | Admitting: Cardiology

## 2016-11-23 ENCOUNTER — Encounter: Payer: Medicare Other | Admitting: *Deleted

## 2016-11-23 NOTE — Telephone Encounter (Signed)
Confirmed remote transmission w/ pt daughter and she informed me that pt is deceased.

## 2016-11-25 ENCOUNTER — Encounter: Payer: Self-pay | Admitting: Cardiology

## 2016-12-02 ENCOUNTER — Other Ambulatory Visit: Payer: Medicare Other

## 2016-12-06 ENCOUNTER — Ambulatory Visit: Payer: Medicare Other | Admitting: Endocrinology

## 2016-12-08 DEATH — deceased

## 2016-12-14 ENCOUNTER — Ambulatory Visit: Payer: Medicare Other | Admitting: Interventional Cardiology

## 2017-04-27 ENCOUNTER — Ambulatory Visit: Payer: Medicare Other | Admitting: Podiatry

## 2017-07-16 IMAGING — CT CT HEAD W/O CM
5 of 8 series · 14 of 47 positions shown, 15 images · non-contrast
Comparison: October 18, 2011

CLINICAL DATA: Altered mental status

EXAM:
CT HEAD WITHOUT CONTRAST
TECHNIQUE: Contiguous axial images were obtained from the base of the skull
through the vertex without intravenous contrast.

[Series 3: head without · axial · non-contrast · 0.47mm/px · z∈[-19,+36]mm · 2 of 33 slices shown, 3 images (1 of 2)]
[im 11/33  brain]
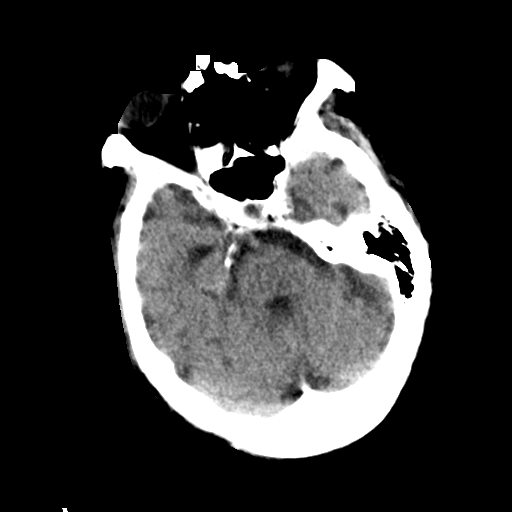
[im 11/33  bone]
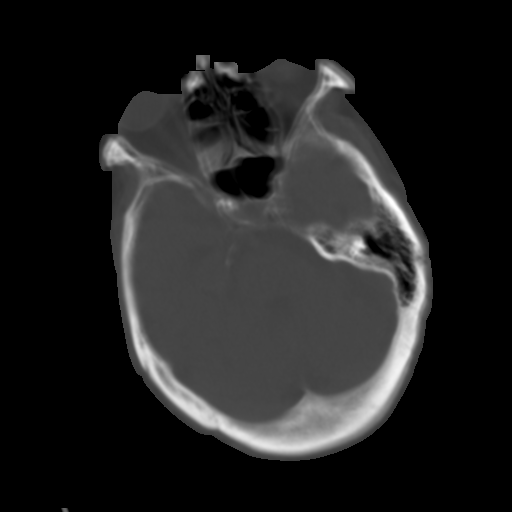
[im 22/33  brain]
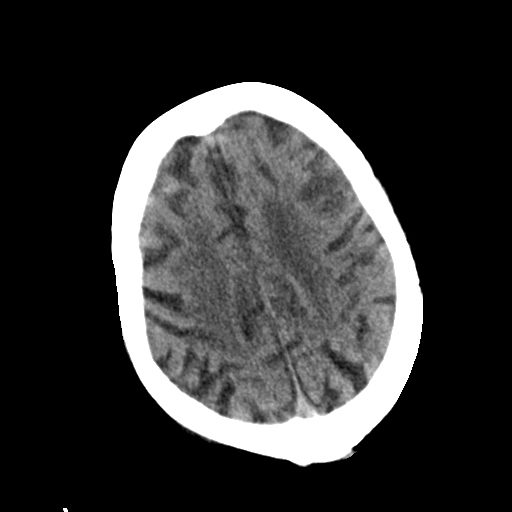

[Series 4: head bone · axial · 0.47mm/px · z∈[-53,-5]mm · 4 of 83 slices shown]
[im 9/83  bone]
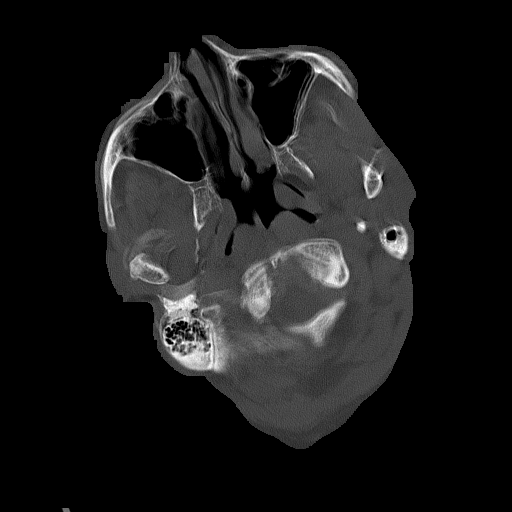
[im 17/83  bone]
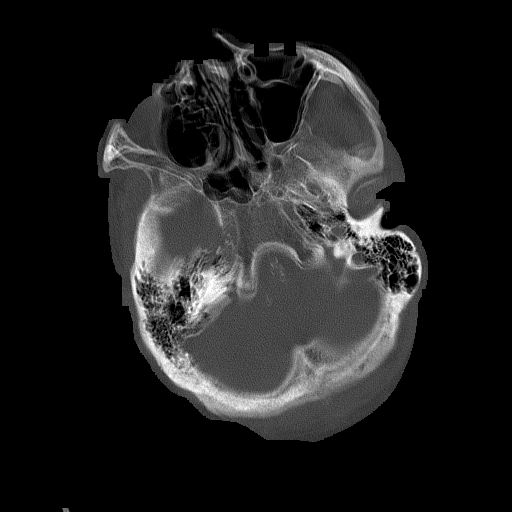
[im 25/83  bone]
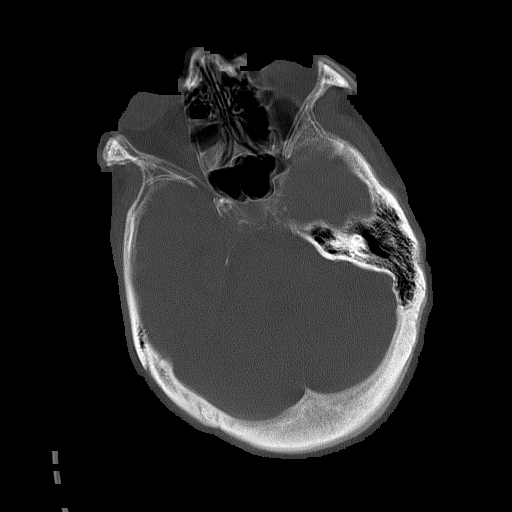
[im 33/83  bone]
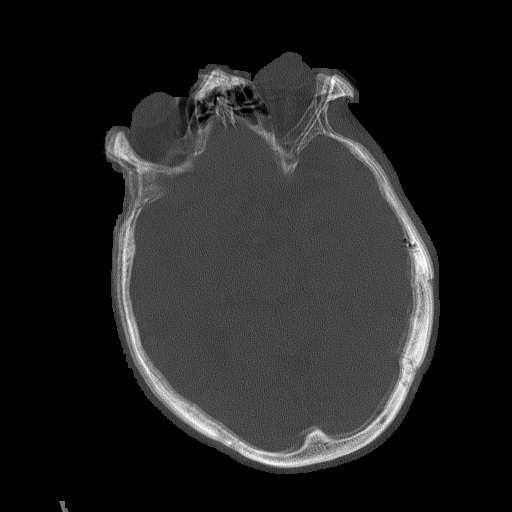

[Series 5: head without cor · coronal · non-contrast · 0.32mm/px · 3 of 74 slices shown]
[im 19/74  brain]
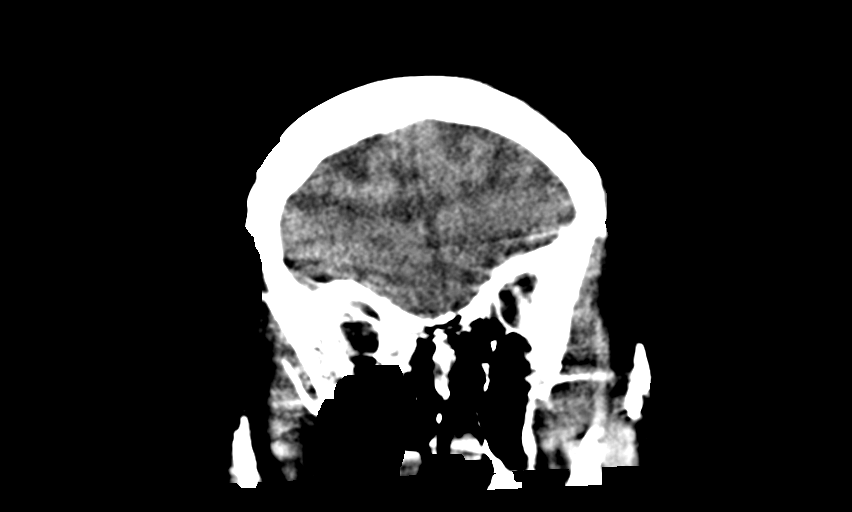
[im 37/74  brain]
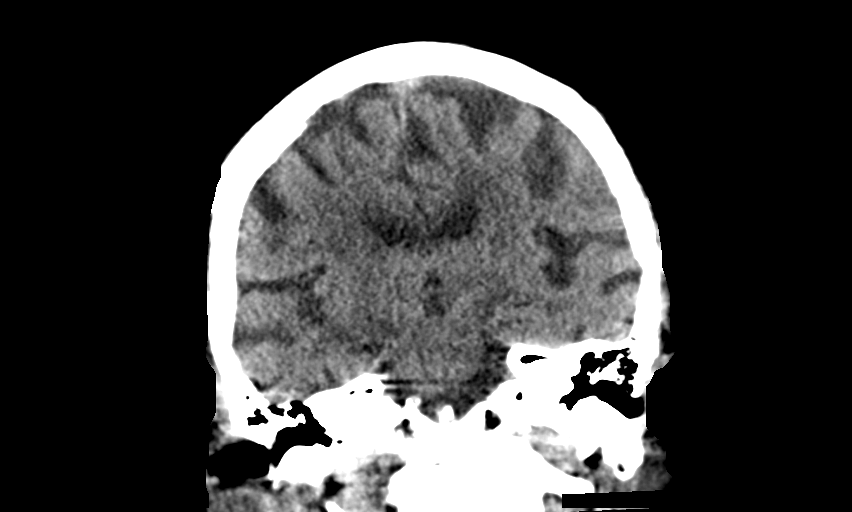
[im 55/74  brain]
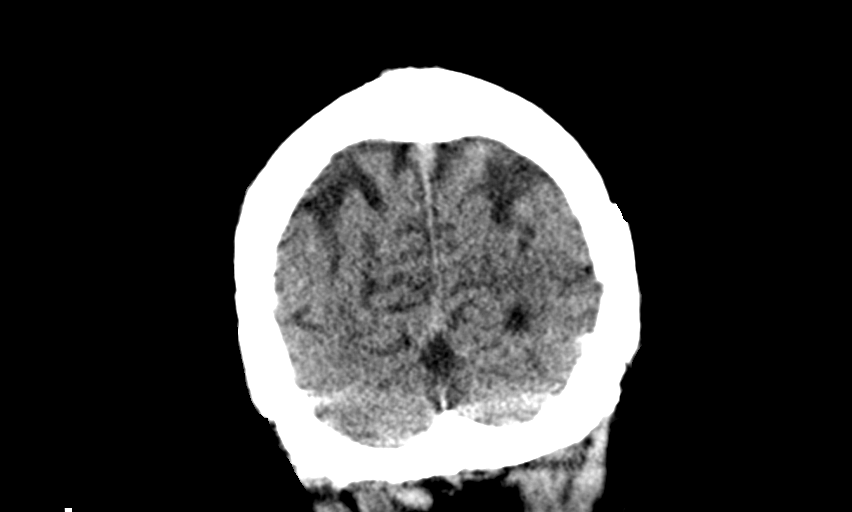

[Series 7: head without · axial · non-contrast · 0.47mm/px · z∈[-19,+36]mm · 2 of 33 slices shown (2 of 2)]
[im 11/33  brain]
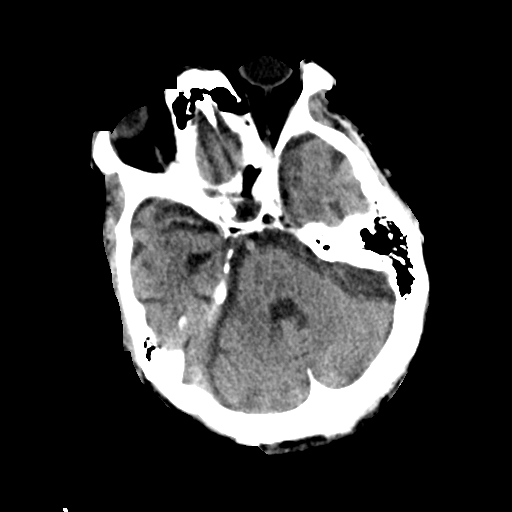
[im 22/33  brain]
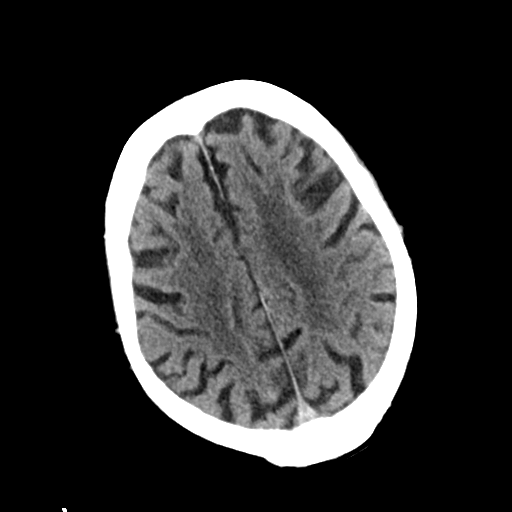

[Series 10: head without sag · sagittal · non-contrast · 0.32mm/px · 3 of 66 slices shown]
[im 12/66  brain]
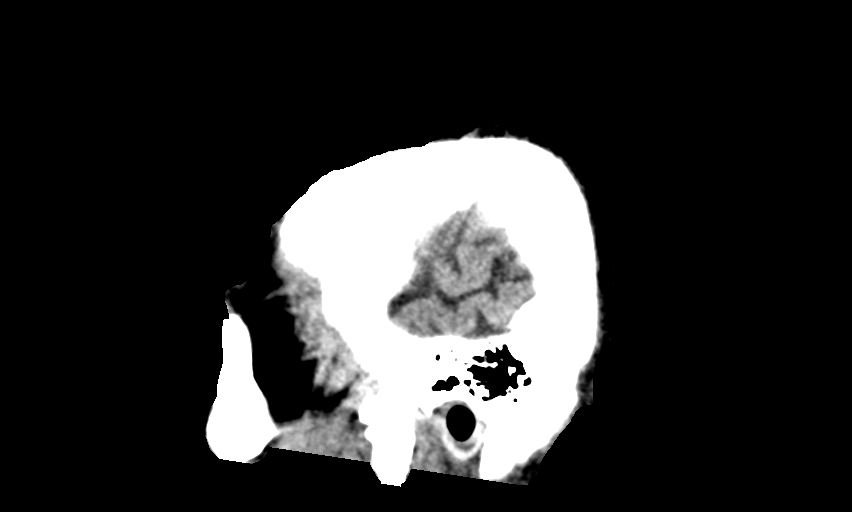
[im 26/66  brain]
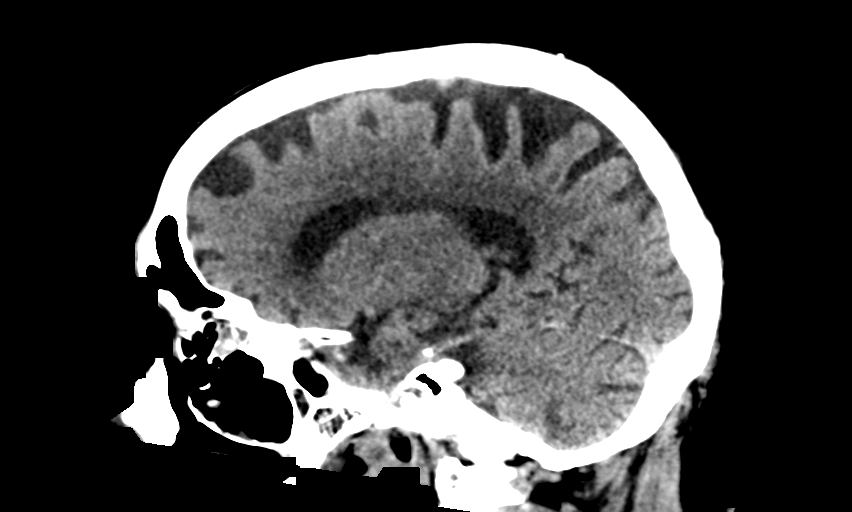
[im 40/66  brain]
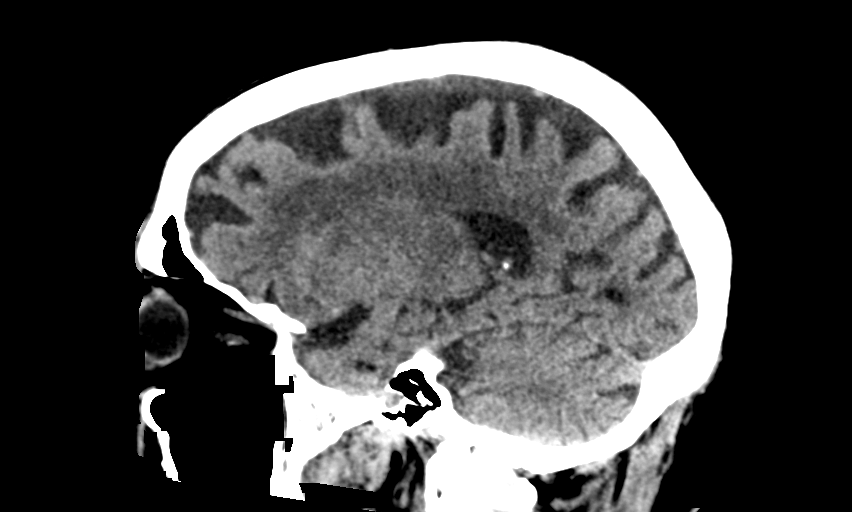

[14 of 47 positions shown; findings below may reference images not displayed]

FINDINGS: Brain: There is moderate diffuse atrophy. There is a focal area of
decreased attenuation in the inferior, posterior aspect of the right
cerebellum, likely a recent infarct involving a portion of the right
posterior inferior cerebral artery distribution. No other evidence
of recent/acute infarct. There is patchy small vessel disease
throughout the centra semiovale bilaterally. There is evidence of
prior infarct involving a portion of the head of the caudate nucleus
on the right and anterior limb of the right internal capsule. There
is no well-defined mass, hemorrhage, extra-axial fluid collection,
or midline shift.

Vascular: There is no appreciable hyperdense vessel. There is
calcification in each carotid siphon region. There is also
calcification in each distal vertebral artery as well as in the
right carotid artery in the petrous region.

Skull: Bones are osteoporotic.  Bony calvarium appears intact.

Sinuses/Orbits: There is opacification of multiple ethmoid air cells
the right. There is mucosal thickening throughout multiple ethmoid
air cells as well. There is mild mucosal thickening inferior left
maxillary antrum anteriorly. There is mucosal thickening in a
portion of the left sphenoid sinus. Orbits appear symmetric
bilaterally.

Other: Mastoid air cells are clear.
IMPRESSION: Findings felt to represent an acute/recent infarct in the
distribution of a portion of the right posterior inferior cerebellar
artery.

There is atrophy with extensive supratentorial small vessel disease.
Prior infarct noted involving the head of the caudate nucleus on the
right as well as a portion of the anterior limb of the right
internal capsule.

No hemorrhage.  No extra-axial fluid.

Extensive arteriovascular calcification.

Foci of paranasal sinus disease, most pronounced in the right
ethmoid air cell complex.
